# Patient Record
Sex: Male | Born: 1955
Health system: Southern US, Community
[De-identification: ages and names within clinical notes are randomized; demographics above are authoritative.]

## PROBLEM LIST (undated history)

## (undated) DIAGNOSIS — F419 Anxiety disorder, unspecified: Secondary | ICD-10-CM

## (undated) DIAGNOSIS — R197 Diarrhea, unspecified: Secondary | ICD-10-CM

## (undated) DIAGNOSIS — M1990 Unspecified osteoarthritis, unspecified site: Secondary | ICD-10-CM

## (undated) DIAGNOSIS — G4733 Obstructive sleep apnea (adult) (pediatric): Secondary | ICD-10-CM

## (undated) DIAGNOSIS — G9349 Other encephalopathy: Secondary | ICD-10-CM

## (undated) DIAGNOSIS — K56609 Unspecified intestinal obstruction, unspecified as to partial versus complete obstruction: Secondary | ICD-10-CM

## (undated) DIAGNOSIS — R011 Cardiac murmur, unspecified: Secondary | ICD-10-CM

## (undated) DIAGNOSIS — F32A Depression, unspecified: Secondary | ICD-10-CM

## (undated) DIAGNOSIS — I10 Essential (primary) hypertension: Secondary | ICD-10-CM

## (undated) DIAGNOSIS — U071 COVID-19: Secondary | ICD-10-CM

## (undated) DIAGNOSIS — G4730 Sleep apnea, unspecified: Secondary | ICD-10-CM

## (undated) DIAGNOSIS — I639 Cerebral infarction, unspecified: Secondary | ICD-10-CM

## (undated) DIAGNOSIS — K4031 Unilateral inguinal hernia, with obstruction, without gangrene, recurrent: Secondary | ICD-10-CM

## (undated) HISTORY — DX: Sleep apnea, unspecified: G47.30

## (undated) HISTORY — PX: JOINT REPLACEMENT: SHX530

## (undated) HISTORY — DX: Depression, unspecified: F32.A

## (undated) HISTORY — DX: Unilateral inguinal hernia, with obstruction, without gangrene, recurrent: K40.31

## (undated) HISTORY — PX: HERNIA REPAIR: SHX51

## (undated) HISTORY — DX: Unspecified intestinal obstruction, unspecified as to partial versus complete obstruction: K56.609

## (undated) HISTORY — DX: COVID-19: U07.1

## (undated) HISTORY — DX: Cerebral infarction, unspecified: I63.9

## (undated) HISTORY — DX: Obstructive sleep apnea (adult) (pediatric): G47.33

## (undated) HISTORY — PX: KNEE SURGERY: SHX244

## (undated) HISTORY — DX: Unspecified osteoarthritis, unspecified site: M19.90

## (undated) HISTORY — DX: Other encephalopathy: G93.49

---

## 2006-08-30 ENCOUNTER — Ambulatory Visit (HOSPITAL_COMMUNITY): Admission: RE | Admit: 2006-08-30 | Discharge: 2006-08-30 | Payer: Self-pay | Admitting: Family Medicine

## 2015-03-25 ENCOUNTER — Encounter (INDEPENDENT_AMBULATORY_CARE_PROVIDER_SITE_OTHER): Payer: No Typology Code available for payment source | Admitting: Ophthalmology

## 2015-03-25 DIAGNOSIS — H33301 Unspecified retinal break, right eye: Secondary | ICD-10-CM | POA: Diagnosis not present

## 2015-03-25 DIAGNOSIS — I10 Essential (primary) hypertension: Secondary | ICD-10-CM

## 2015-03-25 DIAGNOSIS — H35033 Hypertensive retinopathy, bilateral: Secondary | ICD-10-CM

## 2015-03-25 DIAGNOSIS — H3531 Nonexudative age-related macular degeneration: Secondary | ICD-10-CM

## 2015-03-25 DIAGNOSIS — H43813 Vitreous degeneration, bilateral: Secondary | ICD-10-CM

## 2015-04-02 ENCOUNTER — Ambulatory Visit (INDEPENDENT_AMBULATORY_CARE_PROVIDER_SITE_OTHER): Payer: No Typology Code available for payment source | Admitting: Ophthalmology

## 2015-04-02 DIAGNOSIS — H33301 Unspecified retinal break, right eye: Secondary | ICD-10-CM

## 2015-04-10 ENCOUNTER — Ambulatory Visit (INDEPENDENT_AMBULATORY_CARE_PROVIDER_SITE_OTHER): Payer: No Typology Code available for payment source | Admitting: Ophthalmology

## 2015-04-10 DIAGNOSIS — H33301 Unspecified retinal break, right eye: Secondary | ICD-10-CM

## 2015-05-19 ENCOUNTER — Observation Stay (HOSPITAL_COMMUNITY)
Admission: EM | Admit: 2015-05-19 | Discharge: 2015-05-20 | Disposition: A | Discharge status: Home / Self Care | Payer: No Typology Code available for payment source | Attending: Internal Medicine | Admitting: Internal Medicine

## 2015-05-19 ENCOUNTER — Emergency Department (HOSPITAL_COMMUNITY): Payer: No Typology Code available for payment source

## 2015-05-19 ENCOUNTER — Encounter (HOSPITAL_COMMUNITY): Payer: Self-pay | Admitting: Emergency Medicine

## 2015-05-19 DIAGNOSIS — N179 Acute kidney failure, unspecified: Secondary | ICD-10-CM | POA: Diagnosis present

## 2015-05-19 DIAGNOSIS — I10 Essential (primary) hypertension: Secondary | ICD-10-CM | POA: Diagnosis not present

## 2015-05-19 DIAGNOSIS — R739 Hyperglycemia, unspecified: Secondary | ICD-10-CM

## 2015-05-19 DIAGNOSIS — F419 Anxiety disorder, unspecified: Secondary | ICD-10-CM | POA: Diagnosis present

## 2015-05-19 DIAGNOSIS — Z79899 Other long term (current) drug therapy: Secondary | ICD-10-CM | POA: Insufficient documentation

## 2015-05-19 DIAGNOSIS — Z7982 Long term (current) use of aspirin: Secondary | ICD-10-CM | POA: Diagnosis not present

## 2015-05-19 DIAGNOSIS — R079 Chest pain, unspecified: Secondary | ICD-10-CM

## 2015-05-19 DIAGNOSIS — R110 Nausea: Secondary | ICD-10-CM | POA: Insufficient documentation

## 2015-05-19 HISTORY — DX: Essential (primary) hypertension: I10

## 2015-05-19 HISTORY — DX: Hyperglycemia, unspecified: R73.9

## 2015-05-19 HISTORY — DX: Chest pain, unspecified: R07.9

## 2015-05-19 HISTORY — DX: Anxiety disorder, unspecified: F41.9

## 2015-05-19 LAB — COMPREHENSIVE METABOLIC PANEL
ALK PHOS: 52 U/L (ref 38–126)
ALT: 22 U/L (ref 17–63)
ANION GAP: 11 (ref 5–15)
AST: 22 U/L (ref 15–41)
Albumin: 4.1 g/dL (ref 3.5–5.0)
BILIRUBIN TOTAL: 1.9 mg/dL — AB (ref 0.3–1.2)
BUN: 33 mg/dL — ABNORMAL HIGH (ref 6–20)
CALCIUM: 9.2 mg/dL (ref 8.9–10.3)
CO2: 26 mmol/L (ref 22–32)
CREATININE: 1.26 mg/dL — AB (ref 0.61–1.24)
Chloride: 99 mmol/L — ABNORMAL LOW (ref 101–111)
GLUCOSE: 117 mg/dL — AB (ref 65–99)
Potassium: 3.5 mmol/L (ref 3.5–5.1)
Sodium: 136 mmol/L (ref 135–145)
Total Protein: 7.3 g/dL (ref 6.5–8.1)

## 2015-05-19 LAB — CBC WITH DIFFERENTIAL/PLATELET
BASOS ABS: 0 10*3/uL (ref 0.0–0.1)
BASOS PCT: 0 % (ref 0–1)
EOS ABS: 0 10*3/uL (ref 0.0–0.7)
EOS PCT: 0 % (ref 0–5)
HEMATOCRIT: 49 % (ref 39.0–52.0)
Hemoglobin: 17.2 g/dL — ABNORMAL HIGH (ref 13.0–17.0)
LYMPHS ABS: 1.5 10*3/uL (ref 0.7–4.0)
LYMPHS PCT: 11 % — AB (ref 12–46)
MCH: 32.3 pg (ref 26.0–34.0)
MCHC: 35.1 g/dL (ref 30.0–36.0)
MCV: 92.1 fL (ref 78.0–100.0)
MONO ABS: 1.5 10*3/uL — AB (ref 0.1–1.0)
Monocytes Relative: 11 % (ref 3–12)
NEUTROS PCT: 78 % — AB (ref 43–77)
Neutro Abs: 10.6 10*3/uL — ABNORMAL HIGH (ref 1.7–7.7)
Platelets: 324 10*3/uL (ref 150–400)
RBC: 5.32 MIL/uL (ref 4.22–5.81)
RDW: 12.5 % (ref 11.5–15.5)
WBC: 13.7 10*3/uL — ABNORMAL HIGH (ref 4.0–10.5)

## 2015-05-19 LAB — LIPID PANEL
CHOLESTEROL: 153 mg/dL (ref 0–200)
HDL: 41 mg/dL (ref >40)
LDL CALC: 99 mg/dL (ref 0–99)
Total CHOL/HDL Ratio: 3.7 RATIO
Triglycerides: 66 mg/dL (ref <150)
VLDL: 13 mg/dL (ref 0–40)

## 2015-05-19 LAB — LIPASE, BLOOD: Lipase: 24 U/L (ref 22–51)

## 2015-05-19 LAB — TROPONIN I
Troponin I: <0.03 ng/mL (ref <0.031)
Troponin I: <0.03 ng/mL (ref <0.031)

## 2015-05-19 LAB — TSH: TSH: 1.497 u[IU]/mL (ref 0.350–4.500)

## 2015-05-19 MED ORDER — MORPHINE SULFATE 2 MG/ML IJ SOLN: 1.0000 mg | INTRAMUSCULAR | Status: DC | PRN

## 2015-05-19 MED ORDER — GI COCKTAIL ~~LOC~~
30.0000 mL | Freq: Once | ORAL | Status: AC
  Administered 2015-05-19: 30 mL via ORAL
  Filled 2015-05-19: qty 30

## 2015-05-19 MED ORDER — ENOXAPARIN SODIUM 40 MG/0.4ML ~~LOC~~ SOLN
40.0000 mg | SUBCUTANEOUS | Status: DC
  Administered 2015-05-19: 40 mg via SUBCUTANEOUS
  Filled 2015-05-19: qty 0.4

## 2015-05-19 MED ORDER — NITROGLYCERIN 0.4 MG SL SUBL: 0.4000 mg | SUBLINGUAL_TABLET | SUBLINGUAL | Status: DC | PRN

## 2015-05-19 MED ORDER — ONDANSETRON HCL 4 MG/2ML IJ SOLN: 4.0000 mg | Freq: Four times a day (QID) | INTRAMUSCULAR | Status: DC | PRN

## 2015-05-19 MED ORDER — ASPIRIN EC 325 MG PO TBEC
325.0000 mg | DELAYED_RELEASE_TABLET | Freq: Every day | ORAL | Status: DC
  Administered 2015-05-19 – 2015-05-20 (×2): 325 mg via ORAL
  Filled 2015-05-19 (×2): qty 1

## 2015-05-19 MED ORDER — HYDROCODONE-ACETAMINOPHEN 5-325 MG PO TABS: 1.0000 | ORAL_TABLET | ORAL | Status: DC | PRN

## 2015-05-19 MED ORDER — SODIUM CHLORIDE 0.9 % IJ SOLN: 3.0000 mL | Freq: Two times a day (BID) | INTRAMUSCULAR | Status: DC

## 2015-05-19 MED ORDER — MAGNESIUM CITRATE PO SOLN: 1.0000 | Freq: Once | ORAL | Status: AC | PRN

## 2015-05-19 MED ORDER — SORBITOL 70 % SOLN: 30.0000 mL | Freq: Every day | Status: DC | PRN

## 2015-05-19 MED ORDER — POLYETHYLENE GLYCOL 3350 17 G PO PACK: 17.0000 g | PACK | Freq: Every day | ORAL | Status: DC | PRN

## 2015-05-19 MED ORDER — GI COCKTAIL ~~LOC~~: 30.0000 mL | Freq: Three times a day (TID) | ORAL | Status: DC | PRN

## 2015-05-19 MED ORDER — ALPRAZOLAM 1 MG PO TABS: 1.0000 mg | ORAL_TABLET | Freq: Every evening | ORAL | Status: DC | PRN

## 2015-05-19 MED ORDER — ACETAMINOPHEN 650 MG RE SUPP: 650.0000 mg | Freq: Four times a day (QID) | RECTAL | Status: DC | PRN

## 2015-05-19 MED ORDER — AMLODIPINE BESYLATE 5 MG PO TABS
10.0000 mg | ORAL_TABLET | Freq: Every day | ORAL | Status: DC
Start: 2015-05-19 — End: 2015-05-20
  Administered 2015-05-19 – 2015-05-20 (×2): 10 mg via ORAL
  Filled 2015-05-19 (×2): qty 2

## 2015-05-19 MED ORDER — SODIUM CHLORIDE 0.9 % IV SOLN
INTRAVENOUS | Status: DC
  Administered 2015-05-19 – 2015-05-20 (×2): via INTRAVENOUS

## 2015-05-19 MED ORDER — ONDANSETRON HCL 4 MG PO TABS: 4.0000 mg | ORAL_TABLET | Freq: Four times a day (QID) | ORAL | Status: DC | PRN

## 2015-05-19 MED ORDER — ACETAMINOPHEN 325 MG PO TABS
650.0000 mg | ORAL_TABLET | Freq: Four times a day (QID) | ORAL | Status: DC | PRN
Start: 2015-05-19 — End: 2015-05-20

## 2015-05-19 NOTE — ED Provider Notes (Signed)
CSN: [id number]     Arrival date & time 05/19/15  1134 History   First MD Initiated Contact with Patient 05/19/15 1211     Chief Complaint  Patient presents with  . Nausea  . Excessive Sweating  . Chest Pain      HPI Pt was seen at 1235. Per pt and his family, c/o gradual onset and persistence of multiple intermittent episodes of chest discomfort for the past several years, worse over the past 2 days. Pt states he "gets like this every 6 months or so." Symptoms include: diaphoresis, nausea, chest discomfort, and "indigestion." Symptoms last approximately 1 hour or so before resolving. Symptoms are not associated with eating. Current symptoms have been intermittent x2 days, occurring during exertion. Pt feels improved while resting now. Pt was evaluated by his PMD this morning, then sent to the ED for further evaluation "of his heart." Pt's family states pt's father "had some kind of heart issue in his 58's." Denies palpitations, no SOB/cough, no abd pain, no vomiting/diarrhea, no back pain.    Past Medical History  Diagnosis Date  . Hypertension    Past Surgical History  Procedure Laterality Date  . Knee surgery      History  Substance Use Topics  . Smoking status: Never Smoker   . Smokeless tobacco: Not on file  . Alcohol Use: No    Review of Systems ROS: Statement: All systems negative except as marked or noted in the HPI; Constitutional: Negative for fever and chills. ; ; Eyes: Negative for eye pain, redness and discharge. ; ; ENMT: Negative for ear pain, hoarseness, nasal congestion, sinus pressure and sore throat. ; ; Cardiovascular: +CP, diaphoresis. Negative for palpitations, dyspnea and peripheral edema. ; ; Respiratory: Negative for cough, wheezing and stridor. ; ; Gastrointestinal: +nausea. Negative for vomiting, diarrhea, abdominal pain, blood in stool, hematemesis, jaundice and rectal bleeding. . ; ; Genitourinary: Negative for dysuria, flank pain and hematuria. ; ;  Musculoskeletal: Negative for back pain and neck pain. Negative for swelling and trauma.; ; Skin: Negative for pruritus, rash, abrasions, blisters, bruising and skin lesion.; ; Neuro: Negative for headache, lightheadedness and neck stiffness. Negative for weakness, altered level of consciousness , altered mental status, extremity weakness, paresthesias, involuntary movement, seizure and syncope.       Allergies  Review of patient's allergies indicates no known allergies.  Home Medications   Prior to Admission medications   Medication Sig Start Date End Date Taking? Authorizing Provider  ALPRAZolam Duanne Moron) 1 MG tablet Take 1 mg by mouth at bedtime as needed for anxiety or sleep.   Yes Historical Provider, MD  amLODipine (NORVASC) 10 MG tablet Take 10 mg by mouth daily.   Yes Historical Provider, MD  aspirin EC 81 MG tablet Take 81 mg by mouth daily.   Yes Historical Provider, MD  cholecalciferol (VITAMIN D) 1000 UNITS tablet Take 1,000 Units by mouth daily.   Yes Historical Provider, MD  clindamycin (CLEOCIN) 300 MG capsule Take 300 mg by mouth 2 (two) times daily. Starting 05/19/2015 x 6 days.   Yes Historical Provider, MD  naproxen sodium (ALEVE) 220 MG tablet Take 220 mg by mouth 2 (two) times daily as needed (pain).   Yes Historical Provider, MD  vitamin C (ASCORBIC ACID) 500 MG tablet Take 500 mg by mouth daily.   Yes Historical Provider, MD   BP 160/97 mmHg  Pulse 75  Temp(Src) 97.9 F (36.6 C) (Oral)  Resp 18  Ht 6' 1"  (1.854  m)  Wt 201 lb (91.173 kg)  BMI 26.52 kg/m2  SpO2 98% Physical Exam  1240: Physical examination:  Nursing notes reviewed; Vital signs and O2 SAT reviewed;  Constitutional: Well developed, Well nourished, Well hydrated, In no acute distress; Head:  Normocephalic, atraumatic; Eyes: EOMI, PERRL, No scleral icterus; ENMT: Mouth and pharynx normal, Mucous membranes moist; Neck: Supple, Full range of motion, No lymphadenopathy; Cardiovascular: Regular rate and  rhythm, No gallop; Respiratory: Breath sounds clear & equal bilaterally, No wheezes.  Speaking full sentences with ease, Normal respiratory effort/excursion; Chest: Nontender, Movement normal; Abdomen: Soft, Nontender, Nondistended, Normal bowel sounds; Genitourinary: No CVA tenderness; Extremities: Pulses normal, No tenderness, No edema, No calf edema or asymmetry.; Neuro: AA&Ox3, Major CN grossly intact.  Speech clear. No gross focal motor or sensory deficits in extremities.; Skin: Color normal, Warm, Dry.   ED Course  Procedures      EKG Interpretation   Date/Time:  Tuesday May 19 2015 11:54:17 EDT Ventricular Rate:  74 PR Interval:  112 QRS Duration: 93 QT Interval:  378 QTC Calculation: 419 R Axis:   91 Text Interpretation:  Sinus rhythm Borderline short PR interval Right axis  deviation Nonspecific repol abnormality, lateral leads Baseline wander No  old tracing to compare Confirmed by [facility]  MD, Nunzio Cory [phone]) on  05/19/2015 1:13:10 PM      MDM  MDM Reviewed: previous chart, nursing note and vitals Reviewed previous: labs Interpretation: labs, ECG and x-ray      Results for orders placed or performed during the hospital encounter of 05/19/15  Troponin I  Result Value Ref Range   Troponin I <0.03 <0.031 ng/mL  CBC with Differential  Result Value Ref Range   WBC 13.7 (H) 4.0 - 10.5 K/uL   RBC 5.32 4.22 - 5.81 MIL/uL   Hemoglobin 17.2 (H) 13.0 - 17.0 g/dL   HCT 49.0 39.0 - 52.0 %   MCV 92.1 78.0 - 100.0 fL   MCH 32.3 26.0 - 34.0 pg   MCHC 35.1 30.0 - 36.0 g/dL   RDW 12.5 11.5 - 15.5 %   Platelets 324 150 - 400 K/uL   Neutrophils Relative % 78 (H) 43 - 77 %   Neutro Abs 10.6 (H) 1.7 - 7.7 K/uL   Lymphocytes Relative 11 (L) 12 - 46 %   Lymphs Abs 1.5 0.7 - 4.0 K/uL   Monocytes Relative 11 3 - 12 %   Monocytes Absolute 1.5 (H) 0.1 - 1.0 K/uL   Eosinophils Relative 0 0 - 5 %   Eosinophils Absolute 0.0 0.0 - 0.7 K/uL   Basophils Relative 0 0 - 1 %    Basophils Absolute 0.0 0.0 - 0.1 K/uL  Comprehensive metabolic panel  Result Value Ref Range   Sodium 136 135 - 145 mmol/L   Potassium 3.5 3.5 - 5.1 mmol/L   Chloride 99 (L) 101 - 111 mmol/L   CO2 26 22 - 32 mmol/L   Glucose, Bld 117 (H) 65 - 99 mg/dL   BUN 33 (H) 6 - 20 mg/dL   Creatinine, Ser 1.26 (H) 0.61 - 1.24 mg/dL   Calcium 9.2 8.9 - 10.3 mg/dL   Total Protein 7.3 6.5 - 8.1 g/dL   Albumin 4.1 3.5 - 5.0 g/dL   AST 22 15 - 41 U/L   ALT 22 17 - 63 U/L   Alkaline Phosphatase 52 38 - 126 U/L   Total Bilirubin 1.9 (H) 0.3 - 1.2 mg/dL   GFR calc non Af Amer >  60 >60 mL/min   GFR calc Af Amer >60 >60 mL/min   Anion gap 11 5 - 15  Lipase, blood  Result Value Ref Range   Lipase 24 22 - 51 U/L   Dg Chest 2 View 05/19/2015   CLINICAL DATA:  Lightheadedness, dizziness and nausea since yesterday.  EXAM: CHEST  2 VIEW  COMPARISON:  None.  FINDINGS: The cardiac silhouette, mediastinal and hilar contours are normal. The lungs are clear. No pleural effusion. The bony thorax is intact.  IMPRESSION: Normal chest x-ray.   Electronically Signed   By: Marijo Sanes M.D.   On: 05/19/2015 12:56    1410:  Concern for anginal equivalent. Pt currently without symptoms. Heart score 5; will observation admit. Dx and testing d/w pt and family.  Questions answered.  Verb understanding, agreeable to admit.  T/C to [facility] Marin Comment, case discussed, including:  HPI, pertinent PM/SHx, VS/PE, dx testing, ED course and treatment:  Agreeable to admit, requests to write temporary orders, obtain observation tele bed to team APAdmits.   Francine Graven, DO 05/21/15 1248

## 2015-05-19 NOTE — H&P (Signed)
[facility] History and Physical  Justin [name] ERX:[id number] DOB: 1956/10/25 DOA: 05/19/2015  Referring physician: Thurnell Garbe PCP: Glo Herring., MD   Chief Complaint: chest pain/diaphoresis/nause  HPI: Justin [name] is a very pleasant 59 y.o. male with past medical hx HTN, anxiety presents to the emergency Department chief complaint of chest pain/diaphoresis nausea. He is admitted for rule out.  Patient states 2 days ago or getting ready for church he developed sudden onset of wooziness. He went on to church and during the service he developed diaphoresis and nausea. During this time he denies any chest pain or discomfort. He does report later in the day developing "indigestion". He denies any history of GERD. States the discomfort located in the substernal area does not radiate equates it with a sensation "of needing to belch". He belched and reports feeling better. Reports not feeling well for 2 days afterwards specifically low-energy low oral intake intermittent chest pain. He denies fever chills abdominal pain dysuria hematuria frequency or urgency. He denies headache visual disturbances syncope or near-syncope. He denies any lower extremity edema but did report sleeping the last 2 nights propped up on 2 pillows. He went to his primary care provider today at his wife's insistence and was referred to the emergency department  Workup in the emergency department significant for WBCs of 13.7 creatinine 1.26 serum glucose 117. Chest x-ray with no acute abnormality. Hemodynamically stable and not hypoxic.  Review of Systems:  10 point review of systems complete and all systems are negative except as indicated in the history of present illness  Past Medical History  Diagnosis Date  . Hypertension   . Anxiety    Past Surgical History  Procedure Laterality Date  . Knee surgery     Social History:  reports that he has never smoked. He does not have any smokeless tobacco history on file.  He reports that he does not drink alcohol or use illicit drugs. 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 other deceased in her 75s history of TB cervical cancer stroke. Father died in his 62s of unknown cause. One brother 17 history of stroke  Prior to Admission medications   Medication Sig Start Date End Date Taking? Authorizing Provider  ALPRAZolam Duanne Moron) 1 MG tablet Take 1 mg by mouth at bedtime as needed for anxiety or sleep.   Yes Historical Provider, MD  amLODipine (NORVASC) 10 MG tablet Take 10 mg by mouth daily.   Yes Historical Provider, MD  aspirin EC 81 MG tablet Take 81 mg by mouth daily.   Yes Historical Provider, MD  cholecalciferol (VITAMIN D) 1000 UNITS tablet Take 1,000 Units by mouth daily.   Yes Historical Provider, MD  clindamycin (CLEOCIN) 300 MG capsule Take 300 mg by mouth 2 (two) times daily. Starting 05/19/2015 x 6 days.   Yes Historical Provider, MD  naproxen sodium (ALEVE) 220 MG tablet Take 220 mg by mouth 2 (two) times daily as needed (pain).   Yes Historical Provider, MD  vitamin C (ASCORBIC ACID) 500 MG tablet Take 500 mg by mouth daily.   Yes Historical Provider, MD   Physical Exam: Filed Vitals:   05/19/15 1330 05/19/15 1400 05/19/15 1430 05/19/15 1446  BP: 127/90 143/85 145/83   Pulse: 72 66 69   Temp:    98.8 F (37.1 C)  TempSrc:    Oral  Resp: 18 17 21    Height:  Weight:      SpO2: 99% 97% 97%     Wt Readings from Last 3 Encounters:  05/19/15 91.173 kg (201 lb)    General:  Appears slightly anxious and comfortable Eyes: PERRL, normal lids, irises & conjunctiva ENT: grossly normal hearing, because membranes of his mouth are pink but very dry Neck: no LAD, masses or thyromegaly Cardiovascular: RRR, no m/r/g. No LE edema. Pedal pulses present and palpable. Nontender to palpation Respiratory: CTA bilaterally, no w/r/r. Normal respiratory  effort. Abdomen: soft, ntnd also bowel sounds throughout no guarding Skin: no rash or induration seen on limited exam Musculoskeletal: grossly normal tone BUE/BLE Psychiatric: grossly normal mood and affect, speech fluent and appropriate Neurologic: grossly non-focal. Speech clear facial symmetry           Labs on Admission:  Basic Metabolic Panel:  Recent Labs Lab 05/19/15 1234  NA 136  K 3.5  CL 99*  CO2 26  GLUCOSE 117*  BUN 33*  CREATININE 1.26*  CALCIUM 9.2   Liver Function Tests:  Recent Labs Lab 05/19/15 1234  AST 22  ALT 22  ALKPHOS 52  BILITOT 1.9*  PROT 7.3  ALBUMIN 4.1    Recent Labs Lab 05/19/15 1234  LIPASE 24   No results for input(s): AMMONIA in the last 168 hours. CBC:  Recent Labs Lab 05/19/15 1234  WBC 13.7*  NEUTROABS 10.6*  HGB 17.2*  HCT 49.0  MCV 92.1  PLT 324   Cardiac Enzymes:  Recent Labs Lab 05/19/15 1234  TROPONINI <0.03    BNP (last 3 results) No results for input(s): BNP in the last 8760 hours.  ProBNP (last 3 results) No results for input(s): PROBNP in the last 8760 hours.  CBG: No results for input(s): GLUCAP in the last 168 hours.  Radiological Exams on Admission: Dg Chest 2 View  05/19/2015   CLINICAL DATA:  Lightheadedness, dizziness and nausea since yesterday.  EXAM: CHEST  2 VIEW  COMPARISON:  None.  FINDINGS: The cardiac silhouette, mediastinal and hilar contours are normal. The lungs are clear. No pleural effusion. The bony thorax is intact.  IMPRESSION: Normal chest x-ray.   Electronically Signed   By: Marijo Sanes M.D.   On: 05/19/2015 12:56    EKG: Independently reviewed. Sinus rhythm borderline short PR interval right axis deviation Nonspecific repol abnormality, lateral leadsBaseline wander  Assessment/Plan Principal Problem:   Chest pain: Some typical and atypical features suspect this is mostly GI related. Will admit to telemetry. Heart score 3. Monitor on telemetry we'll get serial EKGs and  cycle troponin. Will also obtain lipid panel hemoglobin A1c TSH. Request 2-D echo for completeness. Will provide 325 mg of aspirin and consider a statin.  Will likely benefit from stress test but at this point I think he can probably be arranged outpatient. Active Problems: Hypertension. Fair control. He is on Norvasc at home. I will continue this. He reports compliance with medication and good control of his blood pressure.   Acute renal failure: Likely related to decreased oral intake. Will hold any nephrotoxic. Will monitor intake and output. Will provide gentle IV fluids. Will recheck in the morning. If no improvement will consider renal ultrasound.    Hyperglycemia: Mild. Will obtain a hemoglobin A1c.    Anxiety: Appears to be stable at baseline. Continue home medications        Code Status: full DVT Prophylaxis: Family Communication: wife at bedside Disposition Plan: home hopefully tomorrow  Time spent: 60 minutes  [facility]  [facility] Pager [phone]  \

## 2015-05-19 NOTE — ED Notes (Signed)
Having nausea and gastric reflux. Denies chest pain but was sent by [facility] to check heart.

## 2015-05-19 NOTE — ED Notes (Signed)
Report given to receiving RN. Pt ready for transport.

## 2015-05-19 NOTE — ED Notes (Signed)
Hospitalist in room with pt.

## 2015-05-20 ENCOUNTER — Observation Stay (HOSPITAL_BASED_OUTPATIENT_CLINIC_OR_DEPARTMENT_OTHER): Payer: No Typology Code available for payment source

## 2015-05-20 ENCOUNTER — Encounter (HOSPITAL_COMMUNITY): Payer: Self-pay | Admitting: General Practice

## 2015-05-20 DIAGNOSIS — R739 Hyperglycemia, unspecified: Secondary | ICD-10-CM | POA: Diagnosis not present

## 2015-05-20 DIAGNOSIS — F419 Anxiety disorder, unspecified: Secondary | ICD-10-CM

## 2015-05-20 DIAGNOSIS — R079 Chest pain, unspecified: Secondary | ICD-10-CM | POA: Diagnosis not present

## 2015-05-20 LAB — CBC
HCT: 44.2 % (ref 39.0–52.0)
Hemoglobin: 15.1 g/dL (ref 13.0–17.0)
MCH: 31.7 pg (ref 26.0–34.0)
MCHC: 34.2 g/dL (ref 30.0–36.0)
MCV: 92.7 fL (ref 78.0–100.0)
Platelets: 280 10*3/uL (ref 150–400)
RBC: 4.77 MIL/uL (ref 4.22–5.81)
RDW: 12.3 % (ref 11.5–15.5)
WBC: 9.8 10*3/uL (ref 4.0–10.5)

## 2015-05-20 LAB — HEMOGLOBIN A1C
Hgb A1c MFr Bld: 5.8 % — ABNORMAL HIGH (ref 4.8–5.6)
Mean Plasma Glucose: 120 mg/dL

## 2015-05-20 LAB — COMPREHENSIVE METABOLIC PANEL
ALBUMIN: 3.5 g/dL (ref 3.5–5.0)
ALT: 22 U/L (ref 17–63)
ANION GAP: 6 (ref 5–15)
AST: 19 U/L (ref 15–41)
Alkaline Phosphatase: 45 U/L (ref 38–126)
BUN: 23 mg/dL — AB (ref 6–20)
CHLORIDE: 102 mmol/L (ref 101–111)
CO2: 28 mmol/L (ref 22–32)
Calcium: 8.3 mg/dL — ABNORMAL LOW (ref 8.9–10.3)
Creatinine, Ser: 1.03 mg/dL (ref 0.61–1.24)
GFR calc Af Amer: >60 mL/min (ref >60)
GFR calc non Af Amer: >60 mL/min (ref >60)
Glucose, Bld: 103 mg/dL — ABNORMAL HIGH (ref 65–99)
Potassium: 4.2 mmol/L (ref 3.5–5.1)
Sodium: 136 mmol/L (ref 135–145)
TOTAL PROTEIN: 6.4 g/dL — AB (ref 6.5–8.1)
Total Bilirubin: 1.5 mg/dL — ABNORMAL HIGH (ref 0.3–1.2)

## 2015-05-20 LAB — TROPONIN I

## 2015-05-20 MED ORDER — LOPERAMIDE HCL 1 MG/5ML PO LIQD
2.0000 mg | Freq: Once | ORAL | Status: AC
  Administered 2015-05-20: 2 mg via ORAL
  Filled 2015-05-20: qty 10

## 2015-05-20 MED ORDER — LOPERAMIDE HCL 1 MG/5ML PO LIQD
ORAL | Status: AC
  Filled 2015-05-20: qty 10

## 2015-05-20 MED ORDER — PANTOPRAZOLE SODIUM 40 MG PO TBEC: 40.0000 mg | DELAYED_RELEASE_TABLET | Freq: Every day | ORAL | Status: DC

## 2015-05-20 MED ORDER — PANTOPRAZOLE SODIUM 40 MG PO TBEC
40.0000 mg | DELAYED_RELEASE_TABLET | Freq: Every day | ORAL | Status: DC
  Administered 2015-05-20: 40 mg via ORAL
  Filled 2015-05-20: qty 1

## 2015-05-20 NOTE — Progress Notes (Addendum)
Pt states that he has been having diarrhea since started an antibiotic for his tooth 2 days ago. I have spoken to Dr.Kim who wants to test for C.Diff and he ordered 2 mg of immodium one time. I will implement Enteric precautions and continue to monitor.

## 2015-05-20 NOTE — Discharge Summary (Signed)
Physician Discharge Summary  [name] SFS:[id number] DOB: Jun 30, 1956 DOA: 05/19/2015  PCP: Glo Herring., MD  Admit date: 05/19/2015 Discharge date: 05/20/2015  Time spent: 40 minutes  Recommendations for Outpatient Follow-up:  1. Follow up with cardiology as scheduled for evaluation of need for stress test   Discharge Diagnoses:  Principal Problem:   Chest pain Active Problems:   Anxiety   Acute renal failure   Hyperglycemia   Discharge Condition: stable  Diet recommendation: heart healthy  Filed Weights   05/19/15 1143  Weight: 91.173 kg (201 lb)    History of present illness:  Justin [name] is a very pleasant 59 y.o. male with past medical hx HTN, anxiety presented to the emergency Department on 05/19/15 chief complaint of chest pain/diaphoresis nausea. He was admitted for rule out. Patient stated 2 days prior as getting ready for church he developed sudden onset of wooziness. He went on to church and during the service he developed diaphoresis and nausea. During this time he denied any chest pain or discomfort. He reported later in the day developing "indigestion". He denied any history of GERD. Stated the discomfort located in the substernal area did not radiate equated it with a sensation "of needing to belch". He belched and reported feeling better. Reported not feeling well for 2 days afterwards specifically low-energy low oral intake intermittent chest pain. He denied fever chills abdominal pain dysuria hematuria frequency or urgency. He denied headache visual disturbances syncope or near-syncope. He denied any lower extremity edema but did report sleeping the last 2 nights propped up on 2 pillows. He went to his primary care provider today at his wife's insistence and was referred to the emergency department Workup in the emergency department significant for WBCs of 13.7 creatinine 1.26 serum glucose 117. Chest x-ray with no acute abnormality. Hemodynamically stable and  not hypoxic.  Hospital Course:  Chest pain: Atypical features. suspect this is mostly GI related as it is related to certain foods which he avoids and relieved by belching.  Heart score 3. No events on telemetry.  EKGs without acute changes, troponin neg x3. Lipid panel within normal limits,  hemoglobin A1c in process at discharge. TSH 1.49. 2-D reveals EF 60% mild LVH. Given LVH will likely benefit from cardiology follow up. Has appointment with [facility] 7/29.  Active Problems: Hypertension. Fair control.   Acute renal failure: Likely related to decreased oral intake.resolved at discharge. Hyperglycemia: Mild. OP follow up with PCP Anxiety: remained stable at baseline.   Procedures:  Echo 05/2015 Wall thickness was increased in a pattern of mild LVH. Systolic function was normal. The estimated ejection fraction was in the range of 60% to 65%. Wall motion was normal; there were no regional wall motion abnormalities. Left ventricular diastolic function parameterswere normal.  Consultations:  none  Discharge Exam: Filed Vitals:   05/20/15 0552  BP: 127/82  Pulse: 70  Temp: 99.1 F (37.3 C)  Resp: 20    General: well nourished appears comfortable Cardiovascular: RRR no MGR no LE edema Respiratory: normal effort BS clear bilaterally no wheeze  Discharge Instructions   Discharge Instructions    Diet - low sodium heart healthy    Complete by:  As directed      Discharge instructions    Complete by:  As directed   Follow up with PCP 1-2 week  Follow up with cardiology as schedule     Increase activity slowly    Complete by:  As directed  Current Discharge Medication List    START taking these medications   Details  pantoprazole (PROTONIX) 40 MG tablet Take 1 tablet (40 mg total) by mouth daily. Qty: 30 tablet, Refills: 0      CONTINUE these medications which have NOT CHANGED   Details  ALPRAZolam (XANAX) 1 MG tablet Take 1 mg by mouth at bedtime as needed  for anxiety or sleep.    amLODipine (NORVASC) 10 MG tablet Take 10 mg by mouth daily.    aspirin EC 81 MG tablet Take 81 mg by mouth daily.    cholecalciferol (VITAMIN D) 1000 UNITS tablet Take 1,000 Units by mouth daily.    clindamycin (CLEOCIN) 300 MG capsule Take 300 mg by mouth 2 (two) times daily. Starting 05/19/2015 x 6 days.    naproxen sodium (ALEVE) 220 MG tablet Take 220 mg by mouth 2 (two) times daily as needed (pain).    vitamin C (ASCORBIC ACID) 500 MG tablet Take 500 mg by mouth daily.       No Known Allergies Follow-up Information    Follow up with Herminio Commons, MD On 06/05/2015.   Specialty:  Cardiology   Why:  appointment at 8:40 am   Contact information:   [address] [place] [address] [phone]       Follow up with Glo Herring., MD On 05/26/2015.   Specialty:  Internal Medicine   Why:  evaluation possible GERD/reflux at 12:30 pm   Contact information:   [address] [place]  [address] [phone]        The results of significant diagnostics from this hospitalization (including imaging, microbiology, ancillary and laboratory) are listed below for reference.    Significant Diagnostic Studies: Dg Chest 2 View  05/19/2015   CLINICAL DATA:  Lightheadedness, dizziness and nausea since yesterday.  EXAM: CHEST  2 VIEW  COMPARISON:  None.  FINDINGS: The cardiac silhouette, mediastinal and hilar contours are normal. The lungs are clear. No pleural effusion. The bony thorax is intact.  IMPRESSION: Normal chest x-ray.   Electronically Signed   By: Marijo Sanes M.D.   On: 05/19/2015 12:56    Labs: Basic Metabolic Panel:  Recent Labs Lab 05/19/15 1234 05/20/15 0714  NA 136 136  K 3.5 4.2  CL 99* 102  CO2 26 28  GLUCOSE 117* 103*  BUN 33* 23*  CREATININE 1.26* 1.03  CALCIUM 9.2 8.3*   Liver Function Tests:  Recent Labs Lab 05/19/15 1234 05/20/15 0714  AST 22 19  ALT 22 22  ALKPHOS 52 45  BILITOT 1.9* 1.5*  PROT  7.3 6.4*  ALBUMIN 4.1 3.5    Recent Labs Lab 05/19/15 1234  LIPASE 24   CBC:  Recent Labs Lab 05/19/15 1234 05/20/15 0714  WBC 13.7* 9.8  NEUTROABS 10.6*  --   HGB 17.2* 15.1  HCT 49.0 44.2  MCV 92.1 92.7  PLT 324 280   Cardiac Enzymes:  Recent Labs Lab 05/19/15 1234 05/19/15 1937 05/20/15 0714  TROPONINI <0.03 <0.03 <0.03   Signed:  Dyanne Carrel M  [facility] 05/20/2015, 3:30 PM  Patient was seen, examined,treatment plan was discussed with the Advance Practice Provider.  I have directly reviewed the clinical findings, lab, imaging studies and management of this patient in detail. I have made the necessary changes to the above noted documentation, and agree with the documentation, as recorded by the Advance Practice Provider.   Marzetta Board, MD [facility] [phone]

## 2015-05-20 NOTE — Progress Notes (Signed)
Patient with orders to be discharge home. Discharge instructions given, patient verbalize understanding. Prescriptions given. Patient stable. Patient left in private vehicle with family.

## 2015-06-05 ENCOUNTER — Encounter: Payer: Self-pay | Admitting: Cardiovascular Disease

## 2015-06-05 ENCOUNTER — Ambulatory Visit (INDEPENDENT_AMBULATORY_CARE_PROVIDER_SITE_OTHER): Payer: PRIVATE HEALTH INSURANCE | Admitting: Cardiovascular Disease

## 2015-06-05 VITALS — BP 126/86 | HR 65 | Ht 73.0 in | Wt 198.0 lb

## 2015-06-05 DIAGNOSIS — R079 Chest pain, unspecified: Secondary | ICD-10-CM

## 2015-06-05 DIAGNOSIS — K219 Gastro-esophageal reflux disease without esophagitis: Secondary | ICD-10-CM

## 2015-06-05 DIAGNOSIS — I10 Essential (primary) hypertension: Secondary | ICD-10-CM | POA: Diagnosis not present

## 2015-06-05 DIAGNOSIS — Z9289 Personal history of other medical treatment: Secondary | ICD-10-CM

## 2015-06-05 DIAGNOSIS — Z8249 Family history of ischemic heart disease and other diseases of the circulatory system: Secondary | ICD-10-CM

## 2015-06-05 DIAGNOSIS — Z87898 Personal history of other specified conditions: Secondary | ICD-10-CM | POA: Diagnosis not present

## 2015-06-05 NOTE — Progress Notes (Signed)
Patient ID: Justin [name], male   DOB: Mar 16, 1956, 59 y.o.   MRN: [id number]       CARDIOLOGY CONSULT NOTE  Patient ID: Justin [name] MRN: [id number] DOB/AGE: 22-Nov-1955 59 y.o.  Admit date: (Not on file) Primary Physician Glo Herring., MD  Reason for Consultation: chest pain  HPI: The patient is a 59 year old male who was recently hospitalized for chest pain. It was deemed atypical for a cardiac etiology and more likely due to GERD. He has a history of hypertension.  Echocardiogram on 05/20/2015 demonstrated normal left ventricular systolic function and regional wall motion, LVEF 60-65%, and mild LVH.  ECG on 05/20/2015 demonstrated sinus rhythm with a mild nonspecific ST segment abnormality and a PVC.  Lipid panel on 05/19/2015 showed total cholesterol 153, triglycerides 66, HDL 41, LDL 99.  He experiences similar episodes approximately twice a year. They are nonexertional. The most recent episode was accompanied by excessive belching. On one occasion prior to this, he vomited. 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 Physical activities are somewhat limited by left knee osteoarthritis and left foot calluses.  Currently denies abdominal pain as well.  Soc: Married. Works as an Clinical biochemist.  Fam: Father died at 67 of unknown causes. Brother had CVA and has pacemaker.  No Known Allergies  Current Outpatient Prescriptions  Medication Sig Dispense Refill  . ALPRAZolam (XANAX) 1 MG tablet Take 1 mg by mouth at bedtime as needed for anxiety or sleep.    Marland Kitchen amLODipine (NORVASC) 10 MG tablet Take 10 mg by mouth daily.    Marland Kitchen aspirin EC 81 MG tablet Take 81 mg by mouth daily.    . cholecalciferol (VITAMIN D) 1000 UNITS tablet Take 2,000 Units by mouth daily.     . naproxen sodium (ALEVE) 220 MG tablet Take 220 mg by mouth 2 (two) times daily as needed (pain).    . pantoprazole (PROTONIX) 40 MG  tablet Take 1 tablet (40 mg total) by mouth daily. 30 tablet 0  . vitamin C (ASCORBIC ACID) 500 MG tablet Take 500 mg by mouth daily.     No current facility-administered medications for this visit.    Past Medical History  Diagnosis Date  . Hypertension   . Anxiety     Past Surgical History  Procedure Laterality Date  . Knee surgery      History   Social History  . Marital Status: Married    Spouse Name: N/A  . Number of Children: N/A  . Years of Education: N/A   Occupational History  . Not on file.   Social History Main Topics  . Smoking status: Never Smoker   . Smokeless tobacco: Not on file  . Alcohol Use: No  . Drug Use: No  . Sexual Activity: Not on file   Other Topics Concern  . Not on file   Social History Narrative     No family history of premature CAD in 1st degree relatives.  Prior to Admission medications   Medication Sig Start Date End Date Taking? Authorizing Provider  ALPRAZolam Duanne Moron) 1 MG tablet Take 1 mg by mouth at bedtime as needed for anxiety or sleep.   Yes Historical Provider, MD  amLODipine (NORVASC) 10 MG tablet Take 10 mg by mouth daily.   Yes Historical Provider, MD  aspirin EC 81 MG tablet Take 81 mg by mouth daily.   Yes Historical Provider, MD  cholecalciferol (VITAMIN  D) 1000 UNITS tablet Take 2,000 Units by mouth daily.    Yes Historical Provider, MD  naproxen sodium (ALEVE) 220 MG tablet Take 220 mg by mouth 2 (two) times daily as needed (pain).   Yes Historical Provider, MD  pantoprazole (PROTONIX) 40 MG tablet Take 1 tablet (40 mg total) by mouth daily. 05/20/15  Yes Lezlie Octave Black, NP  vitamin C (ASCORBIC ACID) 500 MG tablet Take 500 mg by mouth daily.   Yes Historical Provider, MD     Review of systems complete and found to be negative unless listed above in HPI     Physical exam Blood pressure 126/86, pulse 65, height 6' 1"  (1.854 m), weight 198 lb (89.812 kg), SpO2 97 %. General: NAD Neck: No JVD, no thyromegaly or  thyroid nodule.  Lungs: Clear to auscultation bilaterally with normal respiratory effort. CV: Nondisplaced PMI. Regular rate and rhythm, normal S1/S2, no S3/S4, no murmur.  No peripheral edema.  No carotid bruit.   Abdomen: Soft, nontender, no distention.  Skin: Intact without lesions or rashes.  Neurologic: Alert and oriented x 3.  Psych: Normal affect. Extremities: Left knee osteoarthritic changes.  HEENT: Normal.   ECG: Most recent ECG reviewed.  Labs:   Lab Results  Component Value Date   WBC 9.8 05/20/2015   HGB 15.1 05/20/2015   HCT 44.2 05/20/2015   MCV 92.7 05/20/2015   PLT 280 05/20/2015   No results for input(s): NA, K, CL, CO2, BUN, CREATININE, CALCIUM, PROT, BILITOT, ALKPHOS, ALT, AST, GLUCOSE in the last 168 hours.  Invalid input(s): LABALBU Lab Results  Component Value Date   TROPONINI <0.03 05/20/2015    Lab Results  Component Value Date   CHOL 153 05/19/2015   Lab Results  Component Value Date   HDL 41 05/19/2015   Lab Results  Component Value Date   LDLCALC 99 05/19/2015   Lab Results  Component Value Date   TRIG 66 05/19/2015   Lab Results  Component Value Date   CHOLHDL 3.7 05/19/2015   No results found for: LDLDIRECT       Studies: No results found.  ASSESSMENT AND PLAN:  1. Chest pain: Atypical symptoms for cardiac etiology but has been recurrent. Uncertain family history given father's premature death. Cannot walk on treadmill due to severe left knee osteoarthritis. Will obtain Lexiscan Cardiolite stress test.  2. Essential HTN: Controlled. No changes.  3. GERD: Takes Protonix prn.  Dispo: f/u to be determined after stress testing.   Signed: Kate Sable, M.D., F.A.C.C.  06/05/2015, 8:43 AM

## 2015-06-05 NOTE — Patient Instructions (Signed)
Your physician recommends that you schedule a follow-up appointment in: TO BE DETERMINED BY TEST RESULTS WITH DR. Bronson Ing  Your physician recommends that you continue on your current medications as directed. Please refer to the Current Medication list given to you today.   Your physician has requested that you have a lexiscan myoview. For further information please visit [email or web address]. Please follow instruction sheet, as given.  Thanks for choosing [facility]!!!

## 2015-08-10 ENCOUNTER — Ambulatory Visit (INDEPENDENT_AMBULATORY_CARE_PROVIDER_SITE_OTHER): Payer: No Typology Code available for payment source | Admitting: Ophthalmology

## 2016-06-02 ENCOUNTER — Ambulatory Visit (INDEPENDENT_AMBULATORY_CARE_PROVIDER_SITE_OTHER): Payer: No Typology Code available for payment source | Admitting: Otolaryngology

## 2017-03-27 ENCOUNTER — Encounter: Payer: Self-pay | Admitting: Family Medicine

## 2017-03-27 ENCOUNTER — Ambulatory Visit (INDEPENDENT_AMBULATORY_CARE_PROVIDER_SITE_OTHER): Payer: BLUE CROSS/BLUE SHIELD | Admitting: Family Medicine

## 2017-03-27 VITALS — BP 182/110 | HR 84 | Temp 97.9°F | Resp 18 | Ht 73.0 in | Wt 184.0 lb

## 2017-03-27 DIAGNOSIS — Z125 Encounter for screening for malignant neoplasm of prostate: Secondary | ICD-10-CM

## 2017-03-27 DIAGNOSIS — Z0000 Encounter for general adult medical examination without abnormal findings: Secondary | ICD-10-CM

## 2017-03-27 DIAGNOSIS — Z1159 Encounter for screening for other viral diseases: Secondary | ICD-10-CM | POA: Diagnosis not present

## 2017-03-27 DIAGNOSIS — I10 Essential (primary) hypertension: Secondary | ICD-10-CM

## 2017-03-27 MED ORDER — LOSARTAN POTASSIUM 100 MG PO TABS: 100.0000 mg | ORAL_TABLET | Freq: Every day | ORAL | 3 refills | Status: DC

## 2017-03-27 NOTE — Progress Notes (Signed)
Subjective:    Patient ID: Justin [name], male    DOB: 03/23/1956, 61 y.o.   MRN: [id number]  HPI Patient is a very pleasant 61 year old white male here today to establish care. Past medical history is significant for hypertension. He has been off all blood pressure medication for more than a year. He discontinued amlodipine due to issues with being unable to sweat on the medication. He discontinued an ACE inhibitor in the past due to coughing. He has never had a colonoscopy. He is overdue for PSA. He is due for hepatitis C and HIV testing. Tetanus shot is up-to-date. He denies any medical concerns today Past Medical History:  Diagnosis Date  . Anxiety   . Hypertension    Past Surgical History:  Procedure Laterality Date  . KNEE SURGERY     tore mcl in high school (70's)   Current Outpatient Prescriptions on File Prior to Visit  Medication Sig Dispense Refill  . aspirin EC 81 MG tablet Take 81 mg by mouth daily.    . cholecalciferol (VITAMIN D) 1000 UNITS tablet Take 2,000 Units by mouth daily.     . vitamin C (ASCORBIC ACID) 500 MG tablet Take 500 mg by mouth daily.    Marland Kitchen amLODipine (NORVASC) 10 MG tablet Take 10 mg by mouth daily.     No current facility-administered medications on file prior to visit.    No Known Allergies Social History   Social History  . Marital status: Married    Spouse name: N/A  . Number of children: N/A  . Years of education: N/A   Occupational History  . Not on file.   Social History Main Topics  . Smoking status: Never Smoker  . Smokeless tobacco: Never Used  . Alcohol use No  . Drug use: No  . Sexual activity: Not on file   Other Topics Concern  . Not on file   Social History Narrative  . No narrative on file   Family History  Problem Relation Age of Onset  . Tuberculosis Mother   . Stroke Mother   . Heart disease Father        died at 51  . Bradycardia Brother        pacemaker  . Cancer Paternal Uncle   . Mental illness  Paternal Grandfather        suicide at 21      Review of Systems  All other systems reviewed and are negative.      Objective:   Physical Exam  Constitutional: He is oriented to person, place, and time. He appears well-developed and well-nourished. No distress.  HENT:  Head: Normocephalic and atraumatic.  Right Ear: External ear normal.  Left Ear: External ear normal.  Nose: Nose normal.  Mouth/Throat: Oropharynx is clear and moist. No oropharyngeal exudate.  Eyes: Conjunctivae and EOM are normal. Pupils are equal, round, and reactive to light. Right eye exhibits no discharge. Left eye exhibits no discharge. No scleral icterus.  Neck: Normal range of motion. Neck supple. No JVD present. No tracheal deviation present. No thyromegaly present.  Cardiovascular: Normal rate, regular rhythm, normal heart sounds and intact distal pulses.  Exam reveals no gallop and no friction rub.   No murmur heard. Pulmonary/Chest: Effort normal and breath sounds normal. No stridor. No respiratory distress. He has no wheezes. He has no rales. He exhibits no tenderness.  Abdominal: Soft. Bowel sounds are normal. He exhibits no distension and no mass. There is no tenderness.  There is no rebound and no guarding.  Musculoskeletal: Normal range of motion. He exhibits no edema, tenderness or deformity.  Lymphadenopathy:    He has no cervical adenopathy.  Neurological: He is alert and oriented to person, place, and time. He has normal reflexes. He displays normal reflexes. No cranial nerve deficit. He exhibits normal muscle tone. Coordination normal.  Skin: Skin is warm. No rash noted. He is not diaphoretic. No erythema. No pallor.  Psychiatric: He has a normal mood and affect. His behavior is normal. Judgment and thought content normal.  Vitals reviewed.         Assessment & Plan:  Essential hypertension - Plan: CBC with Differential/Platelet, COMPLETE METABOLIC PANEL WITH GFR, Lipid panel  General  medical exam - Plan: CBC with Differential/Platelet, COMPLETE METABOLIC PANEL WITH GFR, Lipid panel, PSA, Hepatitis C Ab Reflex HCV RNA, QUANT, HIV antibody  Need for hepatitis C screening test - Plan: Hepatitis C Ab Reflex HCV RNA, QUANT  Prostate cancer screening - Plan: PSA  Patient declines a colonoscopy. He will think about it but at the present time he is not interested. He declines digital rectal exam. He will consent to a PSA. He would also like to be screened for HIV and hepatitis c.  We discussed the shingles vaccine but at the present time he deferred that. I asked him to return fasting for a CBC, CMP, fasting lipid panel, PSA. We'll start the patient on losartan 100 mg by mouth daily and recheck blood pressure in 3 weeks. There is also an atypical mole in the center of his back around the level of T7 that I would recommend biopsying at that time.

## 2017-04-28 ENCOUNTER — Encounter: Payer: Self-pay | Admitting: Family Medicine

## 2017-04-28 ENCOUNTER — Ambulatory Visit (INDEPENDENT_AMBULATORY_CARE_PROVIDER_SITE_OTHER): Payer: BLUE CROSS/BLUE SHIELD | Admitting: Family Medicine

## 2017-04-28 VITALS — BP 136/94 | HR 68 | Temp 97.8°F | Resp 14 | Ht 73.0 in | Wt 181.0 lb

## 2017-04-28 DIAGNOSIS — I10 Essential (primary) hypertension: Secondary | ICD-10-CM

## 2017-04-28 DIAGNOSIS — R634 Abnormal weight loss: Secondary | ICD-10-CM | POA: Diagnosis not present

## 2017-04-28 DIAGNOSIS — Z125 Encounter for screening for malignant neoplasm of prostate: Secondary | ICD-10-CM | POA: Diagnosis not present

## 2017-04-28 DIAGNOSIS — K529 Noninfective gastroenteritis and colitis, unspecified: Secondary | ICD-10-CM | POA: Diagnosis not present

## 2017-04-28 LAB — CBC WITH DIFFERENTIAL/PLATELET
BASOS PCT: 0 %
Basophils Absolute: 0 cells/uL (ref 0–200)
EOS ABS: 72 {cells}/uL (ref 15–500)
Eosinophils Relative: 1 %
HEMATOCRIT: 45.9 % (ref 38.5–50.0)
Hemoglobin: 15.3 g/dL (ref 13.0–17.0)
LYMPHS ABS: 1152 {cells}/uL (ref 850–3900)
Lymphocytes Relative: 16 %
MCH: 31.9 pg (ref 27.0–33.0)
MCHC: 33.3 g/dL (ref 32.0–36.0)
MCV: 95.8 fL (ref 80.0–100.0)
MPV: 8.9 fL (ref 7.5–12.5)
Monocytes Absolute: 864 cells/uL (ref 200–950)
Monocytes Relative: 12 %
NEUTROS ABS: 5112 {cells}/uL (ref 1500–7800)
Neutrophils Relative %: 71 %
Platelets: 352 10*3/uL (ref 140–400)
RBC: 4.79 MIL/uL (ref 4.20–5.80)
RDW: 13 % (ref 11.0–15.0)
WBC: 7.2 10*3/uL (ref 3.8–10.8)

## 2017-04-28 NOTE — Progress Notes (Signed)
Subjective:    Patient ID: Justin [name], male    DOB: Aug 22, 1956, 61 y.o.   MRN: [id number]  HPI  03/27/17 Patient is a very pleasant 61 year old white male here today to establish care. Past medical history is significant for hypertension. He has been off all blood pressure medication for more than a year. He discontinued amlodipine due to issues with being unable to sweat on the medication. He discontinued an ACE inhibitor in the past due to coughing. He has never had a colonoscopy. He is overdue for PSA. He is due for hepatitis C and HIV testing. Tetanus shot is up-to-date. He denies any medical concerns today.  At that time, my plan was: Patient declines a colonoscopy. He will think about it but at the present time he is not interested. He declines digital rectal exam. He will consent to a PSA. He would also like to be screened for HIV and hepatitis c.  We discussed the shingles vaccine but at the present time he deferred that. I asked him to return fasting for a CBC, CMP, fasting lipid panel, PSA. We'll start the patient on losartan 100 mg by mouth daily and recheck blood pressure in 3 weeks. There is also an atypical mole in the center of his back around the level of T7 that I would recommend biopsying at that time.  04/28/17 Blood pressure at home has been averaging between 937 and 169 systolic over 67-89 diastolic. He is tolerating the medication without problems. However he reports almost 3 months of chronic watery diarrhea. His wife states that his lost almost 20 pounds in the 3 months. As soon as he eats, he will develop bloating, gas, flatus, and watery diarrhea. He denies any abdominal pain nausea or vomiting. He denies any blood in his stool. He denies any melena. He denies any fevers or chills. He does report fatigue but there are no exacerbating foods. He denies any reflux. Denies any dysphasia. Past Medical History:  Diagnosis Date  . Anxiety   . Hypertension    Past Surgical  History:  Procedure Laterality Date  . KNEE SURGERY     tore mcl in high school (70's)   Current Outpatient Prescriptions on File Prior to Visit  Medication Sig Dispense Refill  . amLODipine (NORVASC) 10 MG tablet Take 10 mg by mouth daily.    Marland Kitchen aspirin EC 81 MG tablet Take 81 mg by mouth daily.    . cholecalciferol (VITAMIN D) 1000 UNITS tablet Take 2,000 Units by mouth daily.     Marland Kitchen losartan (COZAAR) 100 MG tablet Take 1 tablet (100 mg total) by mouth daily. 90 tablet 3  . vitamin C (ASCORBIC ACID) 500 MG tablet Take 500 mg by mouth daily.     No current facility-administered medications on file prior to visit.    No Known Allergies Social History   Social History  . Marital status: Married    Spouse name: N/A  . Number of children: N/A  . Years of education: N/A   Occupational History  . Not on file.   Social History Main Topics  . Smoking status: Never Smoker  . Smokeless tobacco: Never Used  . Alcohol use No  . Drug use: No  . Sexual activity: Not on file   Other Topics Concern  . Not on file   Social History Narrative  . No narrative on file   Family History  Problem Relation Age of Onset  . Tuberculosis Mother   .  Stroke Mother   . Heart disease Father        died at 79  . Bradycardia Brother        pacemaker  . Cancer Paternal Uncle   . Mental illness Paternal Grandfather        suicide at 83      Review of Systems  All other systems reviewed and are negative.      Objective:   Physical Exam  Constitutional: He is oriented to person, place, and time. He appears well-developed and well-nourished. No distress.  HENT:  Head: Normocephalic and atraumatic.  Right Ear: External ear normal.  Left Ear: External ear normal.  Nose: Nose normal.  Mouth/Throat: Oropharynx is clear and moist. No oropharyngeal exudate.  Eyes: Conjunctivae and EOM are normal. Pupils are equal, round, and reactive to light. Right eye exhibits no discharge. Left eye exhibits  no discharge. No scleral icterus.  Neck: Normal range of motion. Neck supple. No JVD present. No tracheal deviation present. No thyromegaly present.  Cardiovascular: Normal rate, regular rhythm, normal heart sounds and intact distal pulses.  Exam reveals no gallop and no friction rub.   No murmur heard. Pulmonary/Chest: Effort normal and breath sounds normal. No stridor. No respiratory distress. He has no wheezes. He has no rales. He exhibits no tenderness.  Abdominal: Soft. Bowel sounds are normal. He exhibits no distension and no mass. There is no tenderness. There is no rebound and no guarding.  Musculoskeletal: Normal range of motion. He exhibits no edema, tenderness or deformity.  Lymphadenopathy:    He has no cervical adenopathy.  Neurological: He is alert and oriented to person, place, and time. He has normal reflexes. No cranial nerve deficit. He exhibits normal muscle tone. Coordination normal.  Skin: Skin is warm. No rash noted. He is not diaphoretic. No erythema. No pallor.  Psychiatric: He has a normal mood and affect. His behavior is normal. Judgment and thought content normal.  Vitals reviewed.         Assessment & Plan:  Chronic diarrhea - Plan: CBC with Differential/Platelet, COMPLETE METABOLIC PANEL WITH GFR, Lipid panel, PSA, TSH, Celiac panel 10, Ambulatory referral to Gastroenterology  Weight loss - Plan: CBC with Differential/Platelet, COMPLETE METABOLIC PANEL WITH GFR, Lipid panel, PSA, TSH, Celiac panel 10, Ambulatory referral to Gastroenterology  Benign essential HTN - Plan: CBC with Differential/Platelet, COMPLETE METABOLIC PANEL WITH GFR, Lipid panel  Prostate cancer screening - Plan: PSA We will defer the biopsy of the mole on his back at the current time. I'm concerned about the chronic diarrhea particularly given the 20 pounds of weight loss. Differential diagnosis includes inflammatory bowel disease, celiac disease, hyperthyroidism, irritable bowel  syndrome,pancreatic insufficiency. Consult GI for possible colonoscopy. Meanwhile obtain CBC, CMP, fasting lipid panel. Goal LDL cholesterol is less than 100. Screen for prostate cancer with a PSA. Screen for celiac disease with a celiac panel. Screen for hyperthyroidism with a TSH.

## 2017-04-29 LAB — COMPLETE METABOLIC PANEL WITH GFR
ALT: 10 U/L (ref 9–46)
AST: 16 U/L (ref 10–35)
Albumin: 4.2 g/dL (ref 3.6–5.1)
Alkaline Phosphatase: 72 U/L (ref 40–115)
BILIRUBIN TOTAL: 0.9 mg/dL (ref 0.2–1.2)
BUN: 20 mg/dL (ref 7–25)
CALCIUM: 9.6 mg/dL (ref 8.6–10.3)
CO2: 22 mmol/L (ref 20–31)
CREATININE: 1.08 mg/dL (ref 0.70–1.25)
Chloride: 104 mmol/L (ref 98–110)
GFR, Est African American: 85 mL/min (ref >=60)
GFR, Est Non African American: 74 mL/min (ref >=60)
Glucose, Bld: 93 mg/dL (ref 70–99)
Potassium: 4.5 mmol/L (ref 3.5–5.3)
Sodium: 139 mmol/L (ref 135–146)
TOTAL PROTEIN: 6.8 g/dL (ref 6.1–8.1)

## 2017-04-29 LAB — LIPID PANEL
Cholesterol: 162 mg/dL (ref <200)
HDL: 47 mg/dL (ref >40)
LDL CALC: 105 mg/dL — AB (ref <100)
TRIGLYCERIDES: 50 mg/dL (ref <150)
Total CHOL/HDL Ratio: 3.4 Ratio (ref <5.0)
VLDL: 10 mg/dL (ref <30)

## 2017-04-29 LAB — TSH: TSH: 1.15 mIU/L (ref 0.40–4.50)

## 2017-04-29 LAB — PSA: PSA: 0.5 ng/mL (ref <=4.0)

## 2017-05-06 LAB — CELIAC PNL 2 RFLX ENDOMYSIAL AB TTR
(tTG) Ab, IgA: <1 U/mL
(tTG) Ab, IgG: 1 U/mL
Endomysial Ab IgA: NEGATIVE
GLIADIN(DEAM) AB,IGG: 2 U (ref <20)
Gliadin(Deam) Ab,IgA: 2 U (ref <20)
Immunoglobulin A: 133 mg/dL (ref 81–463)

## 2017-05-08 ENCOUNTER — Encounter (INDEPENDENT_AMBULATORY_CARE_PROVIDER_SITE_OTHER): Payer: Self-pay | Admitting: Internal Medicine

## 2017-05-08 ENCOUNTER — Encounter (INDEPENDENT_AMBULATORY_CARE_PROVIDER_SITE_OTHER): Payer: Self-pay | Admitting: *Deleted

## 2017-05-08 ENCOUNTER — Other Ambulatory Visit (INDEPENDENT_AMBULATORY_CARE_PROVIDER_SITE_OTHER): Payer: Self-pay | Admitting: Internal Medicine

## 2017-05-08 ENCOUNTER — Telehealth (INDEPENDENT_AMBULATORY_CARE_PROVIDER_SITE_OTHER): Payer: Self-pay | Admitting: *Deleted

## 2017-05-08 ENCOUNTER — Ambulatory Visit (INDEPENDENT_AMBULATORY_CARE_PROVIDER_SITE_OTHER): Payer: BLUE CROSS/BLUE SHIELD | Admitting: Internal Medicine

## 2017-05-08 VITALS — BP 142/80 | HR 64 | Temp 98.1°F | Ht 72.0 in | Wt 182.1 lb

## 2017-05-08 DIAGNOSIS — R634 Abnormal weight loss: Secondary | ICD-10-CM | POA: Diagnosis not present

## 2017-05-08 DIAGNOSIS — R197 Diarrhea, unspecified: Secondary | ICD-10-CM | POA: Diagnosis not present

## 2017-05-08 MED ORDER — PEG-KCL-NACL-NASULF-NA ASC-C 100 G PO SOLR: 1.0000 | Freq: Once | ORAL | 0 refills | Status: AC

## 2017-05-08 NOTE — Telephone Encounter (Signed)
Patient needs movi prep 

## 2017-05-08 NOTE — Patient Instructions (Signed)
The risks of bleeding, perforation and infection were reviewed with patient.  

## 2017-05-08 NOTE — Progress Notes (Signed)
   Subjective:    Patient ID: Justin [name], male    DOB: [birth year], 61 y.o.   MRN: [id number]  HPI Referred by Dr. Dennard Schaumann for chronic diarrhea. He has had diarrhea for several months. He will have the diarrhea randomly. Diarrhea sometimes will occur sometimes after he eats.  ON average he averages 1-2 stools a day sometimes 3 stools. Stools for the most part are loose. He has a lot of gas. No recent antibiotics. He says he has lost 20-30 pounds which was unintentional.  Has not seen any blood in his stools.   Declined rectal per records Has never had a colonoscopy.   No family hx of colon cancer.      04/28/2017 Celiac panel negative.03/27/2017 Hep C negative. HIV antibody neative. CMP Latest Ref Rng & Units 04/28/2017 05/20/2015 05/19/2015  Glucose 70 - 99 mg/dL 93 103(H) 117(H)  BUN 7 - 25 mg/dL 20 23(H) 33(H)  Creatinine 0.70 - 1.25 mg/dL 1.08 1.03 1.26(H)  Sodium 135 - 146 mmol/L 139 136 136  Potassium 3.5 - 5.3 mmol/L 4.5 4.2 3.5  Chloride 98 - 110 mmol/L 104 102 99(L)  CO2 20 - 31 mmol/L 22 28 26   Calcium 8.6 - 10.3 mg/dL 9.6 8.3(L) 9.2  Total Protein 6.1 - 8.1 g/dL 6.8 6.4(L) 7.3  Total Bilirubin 0.2 - 1.2 mg/dL 0.9 1.5(H) 1.9(H)  Alkaline Phos 40 - 115 U/L 72 45 52  AST 10 - 35 U/L 16 19 22   ALT 9 - 46 U/L 10 22 22     Review of Systems Past Medical History:  Diagnosis Date  . Anxiety   . Hypertension     Past Surgical History:  Procedure Laterality Date  . KNEE SURGERY     tore mcl in high school (70's)    No Known Allergies  Current Outpatient Prescriptions on File Prior to Visit  Medication Sig Dispense Refill  . aspirin EC 81 MG tablet Take 81 mg by mouth daily.    . cholecalciferol (VITAMIN D) 1000 UNITS tablet Take 2,000 Units by mouth daily.     Marland Kitchen losartan (COZAAR) 100 MG tablet Take 1 tablet (100 mg total) by mouth daily. 90 tablet 3  . vitamin C (ASCORBIC ACID) 500 MG tablet Take 500 mg by mouth daily.     No current facility-administered medications on  file prior to visit.         Objective:   Physical Exam Blood pressure (!) 142/80, pulse 64, temperature 98.1 F (36.7 C), height 6' (1.829 m), weight 182 lb 1.6 oz (82.6 kg).   Alert and oriented. Skin warm and dry. Oral mucosa is moist.   . Sclera anicteric, conjunctivae is pink. Thyroid not enlarged. No cervical lymphadenopathy. Lungs clear. Heart regular rate and rhythm.  Abdomen is soft. Bowel sounds are positive. No hepatomegaly. No abdominal masses felt. No tenderness.  No edema to lower extremities.  .     Assessment & Plan:  Weight loss ? Etiology. Diarrhea.  Colonic neoplasm, polyp needs to be ruled out. Colonoscopy.  Stool studies.  3 stool cards home with patient.

## 2017-05-12 ENCOUNTER — Encounter (INDEPENDENT_AMBULATORY_CARE_PROVIDER_SITE_OTHER): Payer: Self-pay | Admitting: *Deleted

## 2017-05-12 ENCOUNTER — Telehealth (INDEPENDENT_AMBULATORY_CARE_PROVIDER_SITE_OTHER): Payer: Self-pay | Admitting: *Deleted

## 2017-05-12 NOTE — Telephone Encounter (Signed)
Patient needs trilyte 

## 2017-05-15 LAB — GASTROINTESTINAL PATHOGEN PANEL PCR
C. DIFFICILE TOX A/B, PCR: NOT DETECTED
CAMPYLOBACTER, PCR: NOT DETECTED
CRYPTOSPORIDIUM, PCR: NOT DETECTED
E coli (ETEC) LT/ST PCR: NOT DETECTED
E coli (STEC) stx1/stx2, PCR: NOT DETECTED
E coli 0157, PCR: NOT DETECTED
Giardia lamblia, PCR: NOT DETECTED
NOROVIRUS, PCR: NOT DETECTED
Rotavirus A, PCR: NOT DETECTED
Salmonella, PCR: NOT DETECTED
Shigella, PCR: NOT DETECTED

## 2017-05-15 MED ORDER — PEG 3350-KCL-NA BICARB-NACL 420 G PO SOLR: 4000.0000 mL | Freq: Once | ORAL | 0 refills | Status: AC

## 2017-05-18 ENCOUNTER — Telehealth (INDEPENDENT_AMBULATORY_CARE_PROVIDER_SITE_OTHER): Payer: Self-pay | Admitting: Internal Medicine

## 2017-05-18 ENCOUNTER — Telehealth (INDEPENDENT_AMBULATORY_CARE_PROVIDER_SITE_OTHER): Payer: Self-pay | Admitting: *Deleted

## 2017-05-18 NOTE — Telephone Encounter (Signed)
   Diagnosis:    Result(s)   Card 1: Positive: 05/13/2017    Card 2: Negative:05/15/2017   Card 3: Positive:05/18/2017    Completed by: Thomas Hoff , LPN   HEMOCCULT SENSA DEVELOPER: LOT#:  [id number] EXPIRATION DATE: 2020/05   HEMOCCULT SENSA CARD:  LOT#:  [id number] EXPIRATION DATE: 05/20   CARD CONTROL RESULTS:  POSITIVE: Positive NEGATIVE: Negative    ADDITIONAL COMMENTS: Results forwarded to the ordering provider, Lelon Perla

## 2017-05-18 NOTE — Telephone Encounter (Signed)
Unable to leave message

## 2017-05-18 NOTE — Telephone Encounter (Signed)
Patient's wife presented to the office and stated that you had called the patient with stool card results, but asked him to call you.  Please call the patient.  [phone]

## 2017-05-18 NOTE — Telephone Encounter (Signed)
Hope I tried call patient but did not get answer. Let patient know his studies were negative. (stool)

## 2017-05-19 ENCOUNTER — Telehealth (INDEPENDENT_AMBULATORY_CARE_PROVIDER_SITE_OTHER): Payer: Self-pay | Admitting: Internal Medicine

## 2017-05-19 NOTE — Telephone Encounter (Signed)
TCS moved to 06/21/17 at [phone] ([phone]), left detailed message for patient

## 2017-05-19 NOTE — Telephone Encounter (Signed)
Results given to wife. Will try to have his colonoscopy moved up.

## 2017-05-19 NOTE — Telephone Encounter (Signed)
Justin [name], If possible, this patient needs to be moved sooner. + for blood and change in his stool.

## 2017-05-31 NOTE — Telephone Encounter (Signed)
I called the patient and informed him.

## 2017-06-21 ENCOUNTER — Encounter (HOSPITAL_COMMUNITY): Payer: Self-pay | Admitting: *Deleted

## 2017-06-21 ENCOUNTER — Encounter (HOSPITAL_COMMUNITY)
Admission: RE | Disposition: A | Discharge status: Home / Self Care | Payer: Self-pay | Source: Ambulatory Visit | Attending: Internal Medicine

## 2017-06-21 ENCOUNTER — Ambulatory Visit (HOSPITAL_COMMUNITY)
Admission: RE | Admit: 2017-06-21 | Discharge: 2017-06-21 | Disposition: A | Discharge status: Home / Self Care | Payer: BLUE CROSS/BLUE SHIELD | Source: Ambulatory Visit | Attending: Internal Medicine | Admitting: Internal Medicine

## 2017-06-21 DIAGNOSIS — Z7982 Long term (current) use of aspirin: Secondary | ICD-10-CM | POA: Insufficient documentation

## 2017-06-21 DIAGNOSIS — F419 Anxiety disorder, unspecified: Secondary | ICD-10-CM | POA: Insufficient documentation

## 2017-06-21 DIAGNOSIS — K648 Other hemorrhoids: Secondary | ICD-10-CM | POA: Insufficient documentation

## 2017-06-21 DIAGNOSIS — Z79899 Other long term (current) drug therapy: Secondary | ICD-10-CM | POA: Diagnosis not present

## 2017-06-21 DIAGNOSIS — K529 Noninfective gastroenteritis and colitis, unspecified: Secondary | ICD-10-CM | POA: Diagnosis not present

## 2017-06-21 DIAGNOSIS — K5730 Diverticulosis of large intestine without perforation or abscess without bleeding: Secondary | ICD-10-CM

## 2017-06-21 DIAGNOSIS — K5100 Ulcerative (chronic) pancolitis without complications: Secondary | ICD-10-CM | POA: Diagnosis not present

## 2017-06-21 DIAGNOSIS — R197 Diarrhea, unspecified: Secondary | ICD-10-CM | POA: Insufficient documentation

## 2017-06-21 DIAGNOSIS — R634 Abnormal weight loss: Secondary | ICD-10-CM | POA: Insufficient documentation

## 2017-06-21 DIAGNOSIS — Z6822 Body mass index (BMI) 22.0-22.9, adult: Secondary | ICD-10-CM | POA: Diagnosis not present

## 2017-06-21 DIAGNOSIS — I10 Essential (primary) hypertension: Secondary | ICD-10-CM | POA: Diagnosis not present

## 2017-06-21 DIAGNOSIS — Z9889 Other specified postprocedural states: Secondary | ICD-10-CM | POA: Diagnosis not present

## 2017-06-21 HISTORY — PX: BIOPSY: SHX5522

## 2017-06-21 HISTORY — DX: Diarrhea, unspecified: R19.7

## 2017-06-21 HISTORY — PX: COLONOSCOPY: SHX5424

## 2017-06-21 SURGERY — COLONOSCOPY
Anesthesia: Moderate Sedation

## 2017-06-21 MED ORDER — MIDAZOLAM HCL 5 MG/5ML IJ SOLN
INTRAMUSCULAR | Status: AC
  Filled 2017-06-21: qty 10

## 2017-06-21 MED ORDER — MEPERIDINE HCL 50 MG/ML IJ SOLN
INTRAMUSCULAR | Status: AC
  Filled 2017-06-21: qty 1

## 2017-06-21 MED ORDER — LIDOCAINE VISCOUS 2 % MT SOLN
OROMUCOSAL | Status: AC
  Filled 2017-06-21: qty 15

## 2017-06-21 MED ORDER — SODIUM CHLORIDE 0.9 % IV SOLN
INTRAVENOUS | Status: DC
  Administered 2017-06-21: 07:00:00 via INTRAVENOUS

## 2017-06-21 MED ORDER — STERILE WATER FOR IRRIGATION IR SOLN
Status: DC | PRN
  Administered 2017-06-21: 2.5 mL

## 2017-06-21 MED ORDER — MEPERIDINE HCL 50 MG/ML IJ SOLN
INTRAMUSCULAR | Status: DC | PRN
  Administered 2017-06-21 (×2): 25 mg via INTRAVENOUS

## 2017-06-21 MED ORDER — MIDAZOLAM HCL 5 MG/5ML IJ SOLN
INTRAMUSCULAR | Status: DC | PRN
  Administered 2017-06-21 (×3): 2 mg via INTRAVENOUS

## 2017-06-21 NOTE — H&P (Signed)
Justin [name] is an 61 y.o. male.   Chief Complaint: Patient is here for colonoscopy. HPI: A 61 year old Caucasian male who presents with over 3 month history of nonbloody diarrhea. Stool studies were negative. He has not tried any medication. He used to have 1 bowel movement like clockwork but not he is having to 4 per day. He denies abdominal pain or nocturnal diarrhea. He states he has lost 30 pounds despite having a good appetite. He has not taken any antibiotics recently or traveled outside the country. Family History is negative for IBD or CRC. This is patient's first colonoscopy.  Past Medical History:  Diagnosis Date  . Anxiety   . Diarrhea   . Hypertension     Past Surgical History:  Procedure Laterality Date  . KNEE SURGERY     tore mcl in high school (70's)    Family History  Problem Relation Age of Onset  . Tuberculosis Mother   . Stroke Mother   . Heart disease Father        died at 31  . Bradycardia Brother        pacemaker  . Cancer Paternal Uncle   . Mental illness Paternal Grandfather        suicide at 64  . Colon cancer Neg Hx    Social History:  reports that he has never smoked. He has never used smokeless tobacco. He reports that he does not drink alcohol or use drugs.  Allergies: No Known Allergies  Medications Prior to Admission  Medication Sig Dispense Refill  . aspirin EC 81 MG tablet Take 81 mg by mouth daily.    . cholecalciferol (VITAMIN D) 1000 UNITS tablet Take 2,000 Units by mouth daily.     Marland Kitchen losartan (COZAAR) 100 MG tablet Take 1 tablet (100 mg total) by mouth daily. 90 tablet 3  . naproxen sodium (ALEVE) 220 MG tablet Take 220 mg by mouth daily as needed.    . vitamin C (ASCORBIC ACID) 500 MG tablet Take 500 mg by mouth daily.      No results found for this or any previous visit (from the past 48 hour(s)). No results found.  ROS  Blood pressure (!) 145/97, pulse (!) 101, temperature 97.7 F (36.5 C), temperature source Oral, resp.  rate 16, height 6' 1"  (1.854 m), weight 169 lb (76.7 kg), SpO2 100 %. Physical Exam  Constitutional: He appears well-developed.  HENT:  Mouth/Throat: Oropharynx is clear and moist.  Eyes: Conjunctivae are normal. No scleral icterus.  Neck: No thyromegaly present.  Cardiovascular: Normal rate, regular rhythm and normal heart sounds.   No murmur heard. Respiratory: Effort normal and breath sounds normal.  GI: Soft. He exhibits no distension and no mass. There is no tenderness.  Large right hernia which is soft and nontender.  Musculoskeletal: He exhibits no edema.  Lymphadenopathy:    He has no cervical adenopathy.  Neurological: He is alert.  Skin: Skin is warm and dry.     Assessment/Plan Unexplained diarrhea with weight loss. Diagnostic colonoscopy.  Hildred Laser, MD 06/21/2017, 7:37 AM

## 2017-06-21 NOTE — Op Note (Signed)
[facility] Patient Name: Justin [name] Procedure Date: 06/21/2017 7:23 AM MRN: [id number] Date of Birth: [birth year] Attending MD: Hildred Laser , MD CSN: [id number] Age: 61 Admit Type: Outpatient Procedure:                Colonoscopy Indications:              Clinically significant diarrhea of unexplained                            origin Providers:                Hildred Laser, MD, Lurline Del, RN, Rosina Lowenstein, RN Referring MD:             [name]. Pickard, MD Medicines:                Meperidine 50 mg IV, Midazolam 6 mg IV Complications:            No immediate complications. Estimated Blood Loss:     Estimated blood loss was minimal. Procedure:                Pre-Anesthesia Assessment:                           - Prior to the procedure, a History and Physical                            was performed, and patient medications and                            allergies were reviewed. The patient's tolerance of                            previous anesthesia was also reviewed. The risks                            and benefits of the procedure and the sedation                            options and risks were discussed with the patient.                            All questions were answered, and informed consent                            was obtained. Prior Anticoagulants: The patient                            last took aspirin 2 days and naproxen 7 days prior                            to the procedure. ASA Grade Assessment: I - A                            normal, healthy patient. After reviewing the risks  and benefits, the patient was deemed in                            satisfactory condition to undergo the procedure.                           After obtaining informed consent, the colonoscope                            was passed under direct vision. Throughout the                            procedure, the patient's blood pressure, pulse, and                    oxygen saturations were monitored continuously. The                            [id number] ([id number]) was introduced through the                            anus and advanced to the the terminal ileum, with                            identification of the appendiceal orifice and IC                            valve. The colonoscopy was performed without                            difficulty. The patient tolerated the procedure                            well. The quality of the bowel preparation was                            excellent. The terminal ileum, ileocecal valve,                            appendiceal orifice, and rectum were photographed. Scope In: 7:47:03 AM Scope Out: 8:09:52 AM Scope Withdrawal Time: 0 hours 16 minutes 2 seconds  Total Procedure Duration: 0 hours 22 minutes 49 seconds  Findings:      The perianal and digital rectal examinations were normal.      The terminal ileum appeared normal.      Inflammation characterized by congestion (edema), erythema, friability,       granularity and shallow ulcerations was found. The entire colon was       spared. This was moderate in severity. Biopsies were taken with a cold       forceps for histology. The pathology specimen was placed into Bottle       Number 1( proximal colon0. The pathology specimen was placed into Bottle       Number 2(left colon). The pathology specimen was placed into Bottle       Number 3(rectum)      A few medium-mouthed diverticula were  found in the sigmoid colon.      Internal hemorrhoids were found during retroflexion. The hemorrhoids       were small. Impression:               - The examined portion of the ileum was normal.                           - Endoscopic appearance consistent with pancolitis                            ulcerative colitis. Inflammatory changes more                            pronounced in proximal colon. This was moderate in                            severity.  Biopsied.                           - Diverticulosis in the sigmoid colon.                           - Internal hemorrhoids.                           - Stool specimen obtained for O and P. Moderate Sedation:      Moderate (conscious) sedation was administered by the endoscopy nurse       and supervised by the endoscopist. The following parameters were       monitored: oxygen saturation, heart rate, blood pressure, CO2       capnography and response to care. Total physician intraservice time was       28 minutes. Recommendation:           - Patient has a contact number available for                            emergencies. The signs and symptoms of potential                            delayed complications were discussed with the                            patient. Return to normal activities tomorrow.                            Written discharge instructions were provided to the                            patient.                           - Resume previous diet today.                           - Continue present medications.                           -  Await pathology results.                           - No recommendation at this time regarding repeat                            colonoscopy.                           - Procedure Code(s):        --- Professional ---                           [phone], Colonoscopy, flexible; with biopsy, single                            or multiple                           99152, Moderate sedation services provided by the                            same physician or other qualified health care                            professional performing the diagnostic or                            therapeutic service that the sedation supports,                            requiring the presence of an independent trained                            observer to assist in the monitoring of the                            patient's level of consciousness and physiological                             status; initial 15 minutes of intraservice time,                            patient age 73 years or older                           [phone], Moderate sedation services; each additional                            15 minutes intraservice time Diagnosis Code(s):        --- Professional ---                           K64.8, Other hemorrhoids                           K51.00, Ulcerative (chronic)  pancolitis without                            complications                           R19.7, Diarrhea, unspecified                           K57.30, Diverticulosis of large intestine without                            perforation or abscess without bleeding CPT copyright 2016 American Medical Association. All rights reserved. The codes documented in this report are preliminary and upon coder review may  be revised to meet current compliance requirements. Hildred Laser, MD Hildred Laser, MD 06/21/2017 8:24:10 AM This report has been signed electronically. Number of Addenda: 0

## 2017-06-21 NOTE — Discharge Instructions (Signed)
Colonoscopy, Adult, Care After This sheet gives you information about how to care for yourself after your procedure. Your health care provider may also give you more specific instructions. If you have problems or questions, contact your health care provider. What can I expect after the procedure? After the procedure, it is common to have:  A small amount of blood in your stool for 24 hours after the procedure.  Some gas.  Mild abdominal cramping or bloating.  Follow these instructions at home: General instructions   For the first 24 hours after the procedure: ? Do not drive or use machinery. ? Do not sign important documents. ? Do not drink alcohol. ? Do your regular daily activities at a slower pace than normal. ? Eat soft, easy-to-digest foods. ? Rest often.  Take over-the-counter or prescription medicines only as told by your health care provider.  It is up to you to get the results of your procedure. Ask your health care provider, or the department performing the procedure, when your results will be ready. Relieving cramping and bloating  Try walking around when you have cramps or feel bloated.  Apply heat to your abdomen as told by your health care provider. Use a heat source that your health care provider recommends, such as a moist heat pack or a heating pad. ? Place a towel between your skin and the heat source. ? Leave the heat on for 20-30 minutes. ? Remove the heat if your skin turns bright red. This is especially important if you are unable to feel pain, heat, or cold. You may have a greater risk of getting burned. Eating and drinking  Drink enough fluid to keep your urine clear or pale yellow.  Resume your normal diet as instructed by your health care provider. Avoid heavy or fried foods that are hard to digest.  Avoid drinking alcohol for as long as instructed by your health care provider. Contact a health care provider if:  You have blood in your stool 2-3  days after the procedure. Get help right away if:  You have more than a small spotting of blood in your stool.  You pass large blood clots in your stool.  Your abdomen is swollen.  You have nausea or vomiting.  You have a fever.  You have increasing abdominal pain that is not relieved with medicine. This information is not intended to replace advice given to you by your health care provider. Make sure you discuss any questions you have with your health care provider. Document Released: 06/07/2004 Document Revised: 07/18/2016 Document Reviewed: 01/05/2016 Elsevier Interactive Patient Education  2018 [place]. Ulcerative Colitis, Adult Ulcerative colitis is long-lasting (chronic) swelling (inflammation) of the large intestine (colon). Sores (ulcers) may also form on the colon. Ulcerative colitis is closely related to another condition of inflammation of the intestines that is called Crohn disease. Together, they are frequently referred to as inflammatory bowel disease (IBD). What are the causes? Ulcerative colitis is caused by increased activity of the immune system in the intestines. The immune system is the system that protects the body against harmful bacteria, viruses, fungi, and other things that can make you sick. When the immune system overacts, it causes inflammation. The cause of the increased immune system activity is not known. What increases the risk? Risk factors of ulcerative colitis include:  Age. This includes: ? Being 64-56 years old. ? Being older than 62 years old.  Having a family history of ulcerative colitis.  Being of Jewish  descent.  What are the signs or symptoms? Common symptoms of ulcerative colitis include rectal bleeding and diarrhea. There is a wide range of symptoms, and a person's symptoms depend on how severe the condition is. Additional symptoms may include:  Pain or cramping in the belly (abdomen).  Fever.  Fatigue.  Weight  loss.  Night sweats.  Rectal pain.  Feeling the immediate need to have a bowel movement.  Nausea.  Loss of appetite.  Anemia.  Joint pain or soreness.  Eye irritation.  Certain skin rashes.  How is this diagnosed? Ulcerative colitis may be diagnosed by:  Medical history and physical exam.  Blood tests and stool tests.  X-rays.  CT scans.  Colonoscopy. For this test, a flexible tube is inserted into your anus and your colon is examined.  Examination of a tissue sample from your colon (biopsy).  How is this treated? Treatment for ulcerative colitis may include medicines to:  Decrease inflammation.  Control your immune system.  Surgery may also be necessary. Follow these instructions at home: Medicines and vitamins  Take medicines only as directed by your doctor. Do not take aspirin.  Ask your doctor if you should take any vitamins or supplements. Lifestyle  Exercise regularly.  Limit alcohol intake to no more than 1 drink per day for nonpregnant women and 2 drinks per day for men. One drink equals 12 ounces of beer, 5 ounces of wine, or 1 ounces of hard liquor. Eating and drinking  Drink enough fluid to keep your urine clear or pale yellow.  Ask your health care provider about the best diet for you. Follow the diet as directed by your health care provider. This may include: ? Avoiding carbonated drinks. ? Avoiding popcorn, vegetable skins, nuts, and other high-fiber foods when you have symptoms of ulcerative colitis. ? Eating smaller meals more often. ? Keeping a food diary. This may help you to find and avoid any foods that make you feel not well.  Limit your caffeine intake. General instructions  Keep all follow-up appointments as directed by your health care provider. This is important. Contact a health care provider if:  Your symptoms do not improve or get worse with treatment.  You continue to lose weight.  You have constant cramps or  loose bowels.  You develop a new skin rash, skin sores, or eye problems.  You have a fever or chills. Get help right away if:  You have bloody diarrhea.  You have severe pain in your abdomen.  You vomit. This information is not intended to replace advice given to you by your health care provider. Make sure you discuss any questions you have with your health care provider. Document Released: 08/03/2005 Document Revised: 06/26/2016 Document Reviewed: 02/16/2015 Elsevier Interactive Patient Education  2018 [place] usual medications and diet. No driving for 24 hours. Physician will call with biopsy results and further recommendations.

## 2017-06-22 ENCOUNTER — Encounter (HOSPITAL_COMMUNITY): Payer: Self-pay | Admitting: Internal Medicine

## 2017-06-22 ENCOUNTER — Other Ambulatory Visit (INDEPENDENT_AMBULATORY_CARE_PROVIDER_SITE_OTHER): Payer: Self-pay | Admitting: Internal Medicine

## 2017-06-22 MED ORDER — MESALAMINE 1.2 G PO TBEC: 1.2000 g | DELAYED_RELEASE_TABLET | Freq: Every day | ORAL | 3 refills | Status: DC

## 2017-06-22 MED ORDER — PREDNISONE 10 MG PO TABS: 30.0000 mg | ORAL_TABLET | Freq: Every day | ORAL | 0 refills | Status: DC

## 2017-06-29 LAB — OVA + PARASITE EXAM

## 2017-06-29 LAB — O&P RESULT

## 2017-07-05 ENCOUNTER — Other Ambulatory Visit (INDEPENDENT_AMBULATORY_CARE_PROVIDER_SITE_OTHER): Payer: Self-pay | Admitting: *Deleted

## 2017-07-13 NOTE — Progress Notes (Signed)
I've sent a staff message to [facility] to find a spot for this patient.

## 2017-07-26 ENCOUNTER — Encounter (INDEPENDENT_AMBULATORY_CARE_PROVIDER_SITE_OTHER): Payer: Self-pay | Admitting: Internal Medicine

## 2017-09-19 ENCOUNTER — Ambulatory Visit (INDEPENDENT_AMBULATORY_CARE_PROVIDER_SITE_OTHER): Payer: BLUE CROSS/BLUE SHIELD | Admitting: Internal Medicine

## 2017-09-19 ENCOUNTER — Encounter (INDEPENDENT_AMBULATORY_CARE_PROVIDER_SITE_OTHER): Payer: Self-pay

## 2017-09-19 ENCOUNTER — Encounter (INDEPENDENT_AMBULATORY_CARE_PROVIDER_SITE_OTHER): Payer: Self-pay | Admitting: Internal Medicine

## 2017-09-19 VITALS — BP 130/84 | HR 65 | Temp 97.0°F | Resp 18 | Ht 72.0 in | Wt 188.5 lb

## 2017-09-19 DIAGNOSIS — K5100 Ulcerative (chronic) pancolitis without complications: Secondary | ICD-10-CM

## 2017-09-19 MED ORDER — MESALAMINE 400 MG PO CPDR: 800.0000 mg | DELAYED_RELEASE_CAPSULE | Freq: Two times a day (BID) | ORAL | 11 refills | Status: DC

## 2017-09-19 NOTE — Patient Instructions (Signed)
Notify if you have rectal bleeding or diarrhea.

## 2017-09-19 NOTE — Progress Notes (Signed)
Presenting complaint;  Follow-up for ulcerative colitis.  Subjective:  Justin [name] 61 year old Caucasian male who underwent colonoscopy about 3 months ago for chronic diarrhea and heme positive stool.  He was found to have pan ulcerative colitis and was begun on oral mesalamine.  He is only taking 1 tablet of Lialda daily.  He is supposed to be taking 2 twice daily.  However he feels much better.  He is having 1 formed stool daily.  He denies abdominal pain melena or rectal bleeding.  He still has not proceeded with surgical consultation for right inguinal hernia.  He is not having any pain.  He states hernia is always there. He states his co-pay for this medication is ranged between 60 and $240 a month. He is not having any side effects.   Current Medications: Outpatient Encounter Medications as of 09/19/2017  Medication Sig  . aspirin EC 81 MG tablet Take 1 tablet (81 mg total) by mouth daily.  . Cholecalciferol (VITAMIN D PO) Take 50 Units daily by mouth.   . diphenhydrAMINE (BENADRYL) 25 MG tablet Take 25 mg at bedtime as needed by mouth for sleep.  Marland Kitchen losartan (COZAAR) 100 MG tablet Take 1 tablet (100 mg total) by mouth daily.  . mesalamine (LIALDA) 1.2 g EC tablet Take 1 tablet (1.2 g total) by mouth daily with breakfast.  . naproxen sodium (ALEVE) 220 MG tablet Take 220 mg by mouth daily as needed.  . vitamin C (ASCORBIC ACID) 500 MG tablet Take 500 mg by mouth daily.  . [DISCONTINUED] predniSONE (DELTASONE) 10 MG tablet Take 3 tablets (30 mg total) by mouth daily with breakfast. (Patient not taking: Reported on 09/19/2017)   No facility-administered encounter medications on file as of 09/19/2017.      Objective: Blood pressure 130/84, pulse 65, temperature (!) 97 F (36.1 C), temperature source Oral, resp. rate 18, height 6' (1.829 m), weight 188 lb 8 oz (85.5 kg). The patient is alert and in no acute distress. Conjunctiva is pink. Sclera is nonicteric Oropharyngeal mucosa is  normal. No neck masses or thyromegaly noted. Cardiac exam with regular rhythm normal S1 and S2. No murmur or gallop noted. Lungs are clear to auscultation. Abdomen is symmetrical.  It is soft and nontender without organomegaly or masses.  He has large right inguinal hernia which is soft and nontender and appears to contain bowel. No LE edema or clubbing noted.   Assessment:  #1.  Pain ulcerative colitis.  His condition was diagnosed 3 months ago.  He is not taking correct dose of oral mesalamine but he appears to be in remission.  Co-pay is too high and will just switch him to another oral mesalamine.  #2.  Right inguinal hernia.  Hernia is quite large.  I do not believe he has narrow neck and therefore not at risk for obstruction or strangulation but he needs to proceed with herniorrhaphy.  He will talk with Dr. Dennard Schaumann about surgical consultation.   Plan:  Discontinue Lialda. Begin Delzicol 800 mg p.o. twice daily.  New prescription given.  If it is not cost effective we will switch him to yet another agent. Unless he has problems he will return for office visit in 1 year.

## 2017-10-09 ENCOUNTER — Encounter: Payer: Self-pay | Admitting: Family Medicine

## 2017-10-09 ENCOUNTER — Ambulatory Visit (INDEPENDENT_AMBULATORY_CARE_PROVIDER_SITE_OTHER): Payer: BLUE CROSS/BLUE SHIELD | Admitting: Family Medicine

## 2017-10-09 VITALS — BP 144/98 | HR 72 | Temp 97.9°F | Resp 14 | Ht 73.0 in | Wt 190.0 lb

## 2017-10-09 DIAGNOSIS — G4733 Obstructive sleep apnea (adult) (pediatric): Secondary | ICD-10-CM

## 2017-10-09 DIAGNOSIS — K4090 Unilateral inguinal hernia, without obstruction or gangrene, not specified as recurrent: Secondary | ICD-10-CM | POA: Diagnosis not present

## 2017-10-10 NOTE — Progress Notes (Signed)
Subjective:    Patient ID: Justin [name], male    DOB: [birth year], 61 y.o.   MRN: [id number]  HPI  Patient has a long-standing history of a large right-sided inguinal hernia.  He has had it for more than a decades  However, recently it began to grow and the patient is now interested in having it treated surgically.  He denies any change in his bowel movements.  He denies any symptoms of incarceration.  He denies any abdominal pain or fevers.  He denies any blood in stool.  On exam, the patient has a extremely large right-sided inguinal hernia that is nonreducible filling the scrotum.  Hernia is roughly the size of a softball.  There are no testicular masses.  There is no inguinal lymphadenopathy that is appreciable.  He also presents with his wife concerned about obstructive sleep apnea.  She states that every night, she will witnessed him stop breathing when he falls asleep.  He snores extremely loudly.  He endorses hypersomnolence.  He has fallen asleep during conversation.  He has fallen asleep while sending a text.  He can easily fall asleep after eating a lunch.  He can easily fall asleep while reading a book or watching TV.  His Epworth sleepiness score is greater than 9.  He states that it is possible he could even fall asleep while driving. Past Medical History:  Diagnosis Date  . Anxiety   . Diarrhea   . Hypertension    Past Surgical History:  Procedure Laterality Date  . BIOPSY  06/21/2017   Procedure: BIOPSY;  Surgeon: Rogene Houston, MD;  Location: [facility];  Service: Endoscopy;;  rectal, righta and left colon;  . COLONOSCOPY N/A 06/21/2017   Procedure: COLONOSCOPY;  Surgeon: Rogene Houston, MD;  Location: [facility];  Service: Endoscopy;  Laterality: N/A;  1:25  . KNEE SURGERY     tore mcl in high school (70's)   Current Outpatient Medications on File Prior to Visit  Medication Sig Dispense Refill  . aspirin EC 81 MG tablet Take 1 tablet (81 mg total) by mouth daily.      . Cholecalciferol (VITAMIN D PO) Take 50 Units daily by mouth.     . diphenhydrAMINE (BENADRYL) 25 MG tablet Take 25 mg at bedtime as needed by mouth for sleep.    Marland Kitchen losartan (COZAAR) 100 MG tablet Take 1 tablet (100 mg total) by mouth daily. 90 tablet 3  . Mesalamine (ASACOL) 400 MG CPDR DR capsule Take 2 capsules (800 mg total) 2 (two) times daily by mouth. 120 capsule 11  . naproxen sodium (ALEVE) 220 MG tablet Take 220 mg by mouth daily as needed.    . vitamin C (ASCORBIC ACID) 500 MG tablet Take 500 mg by mouth daily.     No current facility-administered medications on file prior to visit.    No Known Allergies Social History   Socioeconomic History  . Marital status: Married    Spouse name: Not on file  . Number of children: Not on file  . Years of education: Not on file  . Highest education level: Not on file  Social Needs  . Financial resource strain: Not on file  . Food insecurity - worry: Not on file  . Food insecurity - inability: Not on file  . Transportation needs - medical: Not on file  . Transportation needs - non-medical: Not on file  Occupational History  . Not on file  Tobacco Use  .  Smoking status: Never Smoker  . Smokeless tobacco: Never Used  Substance and Sexual Activity  . Alcohol use: No    Alcohol/week: 0.0 oz  . Drug use: No  . Sexual activity: Not on file  Other Topics Concern  . Not on file  Social History Narrative  . Not on file   Family History  Problem Relation Age of Onset  . Tuberculosis Mother   . Stroke Mother   . Heart disease Father        died at 87  . Bradycardia Brother        pacemaker  . Cancer Paternal Uncle   . Mental illness Paternal Grandfather        suicide at 42  . Colon cancer Neg Hx       Review of Systems  All other systems reviewed and are negative.      Objective:   Physical Exam  Constitutional: He is oriented to person, place, and time. He appears well-developed and well-nourished. No distress.   HENT:  Head: Normocephalic and atraumatic.  Right Ear: External ear normal.  Left Ear: External ear normal.  Nose: Nose normal.  Mouth/Throat: Oropharynx is clear and moist. No oropharyngeal exudate.  Eyes: Conjunctivae and EOM are normal. Pupils are equal, round, and reactive to light. Right eye exhibits no discharge. Left eye exhibits no discharge. No scleral icterus.  Neck: Normal range of motion. Neck supple. No JVD present. No tracheal deviation present. No thyromegaly present.  Cardiovascular: Normal rate, regular rhythm, normal heart sounds and intact distal pulses. Exam reveals no gallop and no friction rub.  No murmur heard. Pulmonary/Chest: Effort normal and breath sounds normal. No stridor. No respiratory distress. He has no wheezes. He has no rales. He exhibits no tenderness.  Abdominal: Soft. Bowel sounds are normal. He exhibits no distension and no mass. There is no tenderness. There is no rebound and no guarding. A hernia is present. Hernia confirmed positive in the right inguinal area. Hernia confirmed negative in the left inguinal area.  Genitourinary: Penis normal.    Right testis shows no mass and no tenderness. Left testis shows no mass and no tenderness. Uncircumcised.  Musculoskeletal: Normal range of motion. He exhibits no edema, tenderness or deformity.  Lymphadenopathy:    He has no cervical adenopathy.       Right: No inguinal adenopathy present.       Left: No inguinal adenopathy present.  Neurological: He is alert and oriented to person, place, and time. He has normal reflexes. No cranial nerve deficit. He exhibits normal muscle tone. Coordination normal.  Skin: Skin is warm. No rash noted. He is not diaphoretic. No erythema. No pallor.  Psychiatric: He has a normal mood and affect. His behavior is normal. Judgment and thought content normal.  Vitals reviewed.  Large inguinal hernia diagrammed in red      Assessment & Plan:  Unilateral inguinal hernia  without obstruction or gangrene, recurrence not specified - Plan: Ambulatory referral to General Surgery  Obstructive sleep apnea syndrome - Plan: Ambulatory referral to Sleep Studies Patient certainly has symptoms of obstructive sleep apnea.  Wife has witnessed apneic episodes.  Therefore I will consult neurology for an overnight sleep study to evaluate further.  He has an extremely large right inguinal hernia.  I have recommended immediate general surgery consultation.

## 2017-11-16 ENCOUNTER — Institutional Professional Consult (permissible substitution): Payer: BLUE CROSS/BLUE SHIELD | Admitting: Neurology

## 2017-11-16 ENCOUNTER — Encounter: Payer: Self-pay | Admitting: General Surgery

## 2017-11-16 ENCOUNTER — Ambulatory Visit: Payer: BC Managed Care – PPO | Admitting: General Surgery

## 2017-11-16 ENCOUNTER — Ambulatory Visit: Payer: BLUE CROSS/BLUE SHIELD | Admitting: General Surgery

## 2017-11-16 VITALS — BP 168/111 | HR 75 | Temp 99.3°F | Ht 73.0 in | Wt 192.0 lb

## 2017-11-16 DIAGNOSIS — K4090 Unilateral inguinal hernia, without obstruction or gangrene, not specified as recurrent: Secondary | ICD-10-CM

## 2017-11-16 NOTE — Progress Notes (Signed)
Justin [name]; [id number]; Dec 03, 1955   HPI Patient is a 62 year old white male who was referred to my care by Dr. Dennard Schaumann for evaluation and treatment of a right inguinal hernia.  Patient states the hernia has been present for over 10 years.  It has always been large in size.  It is so large that sometimes gets in his way with movement, but he has had no nausea or vomiting.  He currently has 0 out of 10 abdominal pain.  He also is being evaluated for left knee pain which has been chronic in nature.  He is seeing an orthopedic surgeon later today. Past Medical History:  Diagnosis Date  . Anxiety   . Diarrhea   . Hypertension     Past Surgical History:  Procedure Laterality Date  . BIOPSY  06/21/2017   Procedure: BIOPSY;  Surgeon: Rogene Houston, MD;  [facility];  Service: Endoscopy;;  rectal, righta and left colon;  . COLONOSCOPY N/A 06/21/2017   Procedure: COLONOSCOPY;  Surgeon: Rogene Houston, MD;  Location: [facility];  Service: Endoscopy;  Laterality: N/A;  1:25  . KNEE SURGERY     tore mcl in high school (70's)    Family History  Problem Relation Age of Onset  . Tuberculosis Mother   . Stroke Mother   . Heart disease Father        died at 9  . Bradycardia Brother        pacemaker  . Cancer Paternal Uncle   . Mental illness Paternal Grandfather        suicide at 80  . Colon cancer Neg Hx     Current Outpatient Medications on File Prior to Visit  Medication Sig Dispense Refill  . aspirin EC 81 MG tablet Take 1 tablet (81 mg total) by mouth daily.    . Cholecalciferol (VITAMIN D PO) Take 50 Units daily by mouth.     . diphenhydrAMINE (BENADRYL) 25 MG tablet Take 25 mg at bedtime as needed by mouth for sleep.    Marland Kitchen losartan (COZAAR) 100 MG tablet Take 1 tablet (100 mg total) by mouth daily. 90 tablet 3  . Mesalamine (ASACOL) 400 MG CPDR DR capsule Take 2 capsules (800 mg total) 2 (two) times daily by mouth. 120 capsule 11  . naproxen sodium (ALEVE) 220 MG  tablet Take 220 mg by mouth daily as needed.    . vitamin C (ASCORBIC ACID) 500 MG tablet Take 500 mg by mouth daily.     No current facility-administered medications on file prior to visit.     No Known Allergies  Social History   Substance and Sexual Activity  Alcohol Use No  . Alcohol/week: 0.0 oz    Social History   Tobacco Use  Smoking Status Never Smoker  Smokeless Tobacco Never Used    Review of Systems  Constitutional: Negative.   HENT: Positive for sinus pain.   Eyes: Negative.   Respiratory: Negative.   Cardiovascular: Negative.   Gastrointestinal: Negative.   Genitourinary: Negative.   Musculoskeletal: Positive for joint pain.  Skin: Negative.   Neurological: Negative.   Endo/Heme/Allergies: Negative.   Psychiatric/Behavioral: Negative.     Objective   Vitals:   11/16/17 1119  BP: (!) 168/111  Pulse: 75  Temp: 99.3 F (37.4 C)    Physical Exam  Constitutional: He is oriented to person, place, and time and well-developed, well-nourished, and in no distress.  HENT:  Head: Normocephalic and atraumatic.  Cardiovascular: Normal rate, regular rhythm and normal heart sounds. Exam reveals no gallop and no friction rub.  No murmur heard. Pulmonary/Chest: Effort normal and breath sounds normal. No respiratory distress. He has no wheezes. He has no rales.  Abdominal: Soft. Bowel sounds are normal. He exhibits no distension. There is no tenderness. There is no rebound.  Very large right inguinal hernia with bowel extending into the scrotal sac.  Would be easily reducible, although it would come right back out given the size of the hernia defect.  No obvious hydrocele present.  Neurological: He is alert and oriented to person, place, and time.  Skin: Skin is warm and dry.  Vitals reviewed.   Assessment  Large right inguinal hernia containing bowel, asymptomatic Plan   The risk of incarceration is low given the size of the hernia.  He is not particularly  symptomatic with the hernia.  He is evaluating his options as to whether to get the hernia fixed or to get his left knee repaired as this is bothering him more.  I told him I would be more than happy to fix the hernia, but it could not be done at the same time as his knee surgery.  Literature was given.  He will think about it.  Follow-up as needed.

## 2017-11-16 NOTE — Patient Instructions (Signed)

## 2017-11-30 ENCOUNTER — Ambulatory Visit: Payer: BLUE CROSS/BLUE SHIELD | Admitting: Neurology

## 2017-11-30 ENCOUNTER — Encounter: Payer: Self-pay | Admitting: Neurology

## 2017-11-30 VITALS — BP 155/89 | HR 77 | Ht 73.0 in | Wt 193.0 lb

## 2017-11-30 DIAGNOSIS — J3089 Other allergic rhinitis: Secondary | ICD-10-CM | POA: Diagnosis not present

## 2017-11-30 DIAGNOSIS — G4710 Hypersomnia, unspecified: Secondary | ICD-10-CM

## 2017-11-30 DIAGNOSIS — G4719 Other hypersomnia: Secondary | ICD-10-CM

## 2017-11-30 DIAGNOSIS — G4730 Sleep apnea, unspecified: Secondary | ICD-10-CM

## 2017-11-30 MED ORDER — TRIAMCINOLONE ACETONIDE 55 MCG/ACT NA AERO: 2.0000 | INHALATION_SPRAY | Freq: Every day | NASAL | 12 refills | Status: DC

## 2017-11-30 NOTE — Progress Notes (Signed)
[facility]   Provider:  [name], [place] D  Primary Care Physician:  [name], MD   Referring Provider: [name], MD   Chief Complaint  Justin [name] presents with  . New Justin [name] (Initial Visit)    pt with wife, she states that he will fall asleep in chair, snores in sleep and stops breathing, likes to sleep in Justin recliner, wakes up confused. tired all Justin time.     HPI:  Justin Justin [name] is a 62 y.o. male , seen here as in a referral from Dr. Dennard Schaumann for a sleep consult.   I have Justin pleasure of meeting Mr. and Mrs. Justin [name] today, Justin Justin [name] is referred for evaluation of excessive daytime sleepiness, witnessed apneas, and some confusion when aroused from sleep. Justin Justin [name] has no significant medical history, his only diagnosis is a recent hernia and at one time he was evaluated by cardiology with a cardiac echo that returned normal.  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 in Justin day may be followed by several hours in Justin office. Justin Justin [name] has developed over Justin last years a habit of falling asleep in Justin den watching TV.  He may sleep there already 2 hours in a recliner before he will go over to Justin bedroom.  Justin couple at bedtime is now around 1130.  Justin bedroom is cool quiet and dark, Justin Justin [name] sleeps on a flat mattress, he sleeps on one pillow for head support one pillow between his knees and rest on his side. He will turned to supine sleep soon after he is asleep.  When he snores loudest, and that is when apneas have been witnessed. He will have always 1 sometimes 2 or 3 bathroom breaks depending on fluid intake, his first bathroom break is usually around 3:00 AM. He does not recall vivid or lucid dreams and there is no dream enactment.  He wakes up spontaneously not depending on work  requirements, he seems to wake up spontaneously between 3 and 5 AM and often has difficulties to go back to sleep.  In that case he usually goes back to his recliner.  He estimates that he gets only 4-5 hours of nocturnal sleep and some nights less. He has not fallen asleep at job sites, but once he is no longer physically active or mentally stimulated he struggles to stay awake. On a week end he may take a nap- 20 minute power to 1 hour.  Sleep medical history and family sleep history:  His father died young - heavy smoker , blood clot at 22, his mother at 64, he was not aware of any sleep disorders in his parents.  Justin Justin [name] has 1 older brother, unaware of any sleep issues.  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  He has never used tobacco products, he does not consume alcohol, but caffeine : Drinks colas, but not more than 1 a day.  On occasion iced tea, not coffee.  He does not drink coffee but decaffeinated tea at home.  Review of Systems: Out of a complete 14 system review, Justin Justin [name] complains of only Justin following symptoms, and all other reviewed systems are negative.  His Epworth Sleepiness Scale is only endorsed at  7 points- wife endorsed at 14 points.  Fatigue severity was endorsed at 45 points. Snoring, witnessed apneas, EDS-   Social History   Socioeconomic History  . Marital status: Married    Spouse name: Not on file  . Number of children: Not on file  . Years of education: Not on file  . Highest education level: Not on file  Social Needs  . Financial resource strain: Not on file  . Food insecurity - worry: Not on file  . Food insecurity - inability: Not on file  . Transportation needs - medical: Not on file  . Transportation needs - non-medical: Not on file  Occupational History  . Not on file  Tobacco Use  . Smoking status: Never Smoker  . Smokeless tobacco: Never  Used  Substance and Sexual Activity  . Alcohol use: No    Alcohol/week: 0.0 oz  . Drug use: No  . Sexual activity: Not on file  Other Topics Concern  . Not on file  Social History Narrative  . Not on file    Family History  Problem Relation Age of Onset  . Tuberculosis Mother   . Stroke Mother   . Heart disease Father        died at 70  . Bradycardia Brother        pacemaker  . Cancer Paternal Uncle   . Mental illness Paternal Grandfather        suicide at 68  . Colon cancer Neg Hx     Past Medical History:  Diagnosis Date  . Anxiety   . Diarrhea   . Hypertension     Past Surgical History:  Procedure Laterality Date  . BIOPSY  06/21/2017   Procedure: BIOPSY;  Surgeon: Rogene Houston, MD;  [facility];  Service: Endoscopy;;  rectal, righta and left colon;  . COLONOSCOPY N/A 06/21/2017   Procedure: COLONOSCOPY;  Surgeon: Rogene Houston, MD;  [facility];  Service: Endoscopy;  Laterality: N/A;  1:25  . KNEE SURGERY     tore mcl in high school (70's)    Current Outpatient Medications  Medication Sig Dispense Refill  . aspirin EC 81 MG tablet Take 1 tablet (81 mg total) by mouth daily.    . Cholecalciferol (VITAMIN D PO) Take 50 Units daily by mouth.     . diphenhydrAMINE (BENADRYL) 25 MG tablet Take 25 mg at bedtime as needed by mouth for sleep.    Marland Kitchen losartan (COZAAR) 100 MG tablet Take 1 tablet (100 mg total) by mouth daily. 90 tablet 3  . Mesalamine (ASACOL) 400 MG CPDR DR capsule Take 2 capsules (800 mg total) 2 (two) times daily by mouth. 120 capsule 11  . naproxen sodium (ALEVE) 220 MG tablet Take 220 mg by mouth daily as needed.    . vitamin C (ASCORBIC ACID) 500 MG tablet Take 500 mg by mouth daily.     No current facility-administered medications for this visit.     Allergies as of 11/30/2017  . (No Known Allergies)    Vitals: BP (!) 155/89   Pulse 77   Ht 6' 1"  (1.854 m)   Wt 193 lb (87.5 kg)   BMI 25.46 kg/m  Last  Weight:  Wt Readings from Last 1 Encounters:  11/30/17 193 lb (87.5 kg)   AUQ:JFHL mass index is 25.46 kg/m.     Last Height:   Ht Readings from Last 1 Encounters:  11/30/17 6' 1"  (1.854 m)  Physical exam:  General: Justin Justin [name] is awake, alert and appears not in acute distress. Justin Justin [name] is well groomed. Head: Normocephalic, atraumatic. Neck is supple. Mallampati 4,  neck circumference: 17.5. Nasal airflow congestion,  Retrognathia is seen.  Cardiovascular:  Regular rate and rhythm , without  murmurs or carotid bruit, and without distended neck veins. Respiratory: Lungs are clear to auscultation. Skin:  Without evidence of edema, or rash Trunk: BMI is 25, Justin Justin [name]'s posture is erect    Neurologic exam : Justin Justin [name] is awake and alert, oriented to place and time.   Attention span & concentration ability appears normal.  He has trouble recalling names.  Speech is fluent,  Without  dysarthria, dysphonia or aphasia.  Mood and affect are appropriate.  Cranial nerves: Pupils are equal and briskly reactive to light. Funduscopic exam without evidence of pallor or edema- retinal tear laser scar in Justin right eye. Floaters.  . Extraocular movements  in vertical and horizontal planes intact and but with a mild  Nystagmus- not just at endpoint. Visual fields by finger perimetry are intact. Hearing to finger rub intact. Facial sensation intact to fine touch. Facial motor strength is symmetric and tongue and uvula move midline. Shoulder shrug was symmetrical.   Motor exam:   Normal tone, muscle bulk and symmetric strength in all extremities. Left Knee crepitus, meniscus damage.  Sensory:  Fine touch, pinprick and vibration were tested in all extremities.  Coordination: Rapid alternating movements in Justin fingers/hands was normal. Finger-to-nose maneuver  normal without evidence of ataxia, dysmetria but a mild action tremor. Gait and station: Justin [name] walks without assistive device and is able  unassisted to climb up to Justin exam table. Strength within normal limits.  Stance is stable and normal.  Toe stand deferred  .Tandem gait is unfragmented. Turns with  3  Steps.  Deep tendon reflexes: in Justin  upper and lower extremities are symmetric and intact.    Assessment:  After physical and neurologic examination, review of laboratory studies,  Personal review of imaging studies, reports of other /same  Imaging studies, results of polysomnography and / or neurophysiology testing and pre-existing records as far as provided in visit., my assessment is   1) Justin Justin [name] daytime sleepiness would be considered excessive.  He is definitely sleep deprived given Justin average of 4-5 hours of nocturnal sleep, Justin Justin [name] morning awakenings and inability to go back to sleep.  He also prefers to sleep in Justin recliner which indicates that respiratory and air exchange problems are probably at Justin base of his insomnia.  He has been witnessed not just to snore family recently but also to stop breathing.  He has no other comorbidities that I can find there is an upper airway that is rather narrow but he is not obese, he does not have neuromuscular disorders, no cardiac or pulmonary disorder.  Plan : go to bed to earlier.  Attended sleep study , AHI SPLIT at 20, and 3 % score.  Nasal congestion- dust allergies. Will prescribe Nasocort.    Justin Justin [name] was advised of Justin nature of Justin diagnosed disorder , Justin treatment options and Justin  risks for general health and wellness arising from not treating Justin condition.   I spent more than 45 minutes of face to face time with Justin Justin [name].  Greater than 50% of time was spent in counseling and coordination of care. We have discussed Justin diagnosis and differential and I answered Justin Justin [name]'s questions.    Plan:  Treatment plan and additional workup :  Prescription for Nasacort or an equivalent generic, Justin Justin [name]'s nasal passage needs to be patent before he could be placed  on CPAP if necessary.  Attended sleep study at [facility] sleep AHI of 20, 3% scoring by [employer].  Sleep hygiene discussion I will give Justin Justin [name] Justin insomnia booklet.  Rv with MD   [name], MD 6/57/9038, 3:33 AM  Certified in Neurology by ABPN Certified in [place] by [facility] [address] [place], [place] [address]

## 2017-11-30 NOTE — Patient Instructions (Signed)
Please remember to try to maintain good sleep hygiene, which means: Keep a regular sleep and wake schedule, try not to exercise or have a meal within 2 hours of your bedtime, try to keep your bedroom conducive for sleep, that is, cool and dark, without light distractors such as an illuminated alarm clock, and refrain from watching TV right before sleep or in the middle of the night and do not keep the TV or radio on during the night. Also, try not to use or play on electronic devices at bedtime, such as your cell phone, tablet PC or laptop. If you like to read at bedtime on an electronic device, try to dim the background light as much as possible. Do not eat in the middle of the night.   We will request a sleep study.    We will look for leg twitching and snoring or sleep apnea.     Sleep Apnea Sleep apnea is a condition that affects breathing. People with sleep apnea have moments during sleep when their breathing pauses briefly or gets shallow. Sleep apnea can cause these symptoms:  Trouble staying asleep.  Sleepiness or tiredness during the day.  Irritability.  Loud snoring.  Morning headaches.  Trouble concentrating.  Forgetting things.  Less interest in sex.  Being sleepy for no reason.  Mood swings.  Personality changes.  Depression.  Waking up a lot during the night to pee (urinate).  Dry mouth.  Sore throat.  Follow these instructions at home:  Make any changes in your routine that your doctor recommends.  Eat a healthy, well-balanced diet.  Take over-the-counter and prescription medicines only as told by your doctor.  Avoid using alcohol, calming medicines (sedatives), and narcotic medicines.  Take steps to lose weight if you are overweight.  If you were given a machine (device) to use while you sleep, use it only as told by your doctor.  Do not use any tobacco products, such as cigarettes, chewing tobacco, and e-cigarettes. If you need help quitting,  ask your doctor.  Keep all follow-up visits as told by your doctor. This is important. Contact a doctor if:  The machine that you were given to use during sleep is uncomfortable or does not seem to be working.  Your symptoms do not get better.  Your symptoms get worse. Get help right away if:  Your chest hurts.  You have trouble breathing in enough air (shortness of breath).  You have an uncomfortable feeling in your back, arms, or stomach.  You have trouble talking.  One side of your body feels weak.  A part of your face is hanging down (drooping). These symptoms may be an emergency. Do not wait to see if the symptoms will go away. Get medical help right away. Call your local emergency services (911 in the [place] Do not drive yourself to the hospital. This information is not intended to replace advice given to you by your health care provider. Make sure you discuss any questions you have with your health care provider. Document Released: 08/02/2008 Document Revised: 06/19/2016 Document Reviewed: 08/03/2015 Elsevier Interactive Patient Education  [employer].

## 2017-12-12 ENCOUNTER — Ambulatory Visit (INDEPENDENT_AMBULATORY_CARE_PROVIDER_SITE_OTHER): Payer: BC Managed Care – PPO | Admitting: Neurology

## 2017-12-12 DIAGNOSIS — G4710 Hypersomnia, unspecified: Secondary | ICD-10-CM

## 2017-12-12 DIAGNOSIS — J3089 Other allergic rhinitis: Secondary | ICD-10-CM

## 2017-12-12 DIAGNOSIS — G4733 Obstructive sleep apnea (adult) (pediatric): Secondary | ICD-10-CM

## 2017-12-12 DIAGNOSIS — G4719 Other hypersomnia: Secondary | ICD-10-CM

## 2017-12-12 DIAGNOSIS — G4730 Sleep apnea, unspecified: Secondary | ICD-10-CM

## 2017-12-15 ENCOUNTER — Encounter: Payer: Self-pay | Admitting: Family Medicine

## 2017-12-15 ENCOUNTER — Other Ambulatory Visit: Payer: Self-pay | Admitting: Neurology

## 2017-12-15 DIAGNOSIS — G4730 Sleep apnea, unspecified: Secondary | ICD-10-CM

## 2017-12-15 DIAGNOSIS — G4733 Obstructive sleep apnea (adult) (pediatric): Secondary | ICD-10-CM

## 2017-12-15 DIAGNOSIS — G4710 Hypersomnia, unspecified: Secondary | ICD-10-CM | POA: Insufficient documentation

## 2017-12-15 DIAGNOSIS — G4734 Idiopathic sleep related nonobstructive alveolar hypoventilation: Secondary | ICD-10-CM

## 2017-12-15 DIAGNOSIS — R0683 Snoring: Secondary | ICD-10-CM

## 2017-12-15 NOTE — Procedures (Signed)
PATIENT'S NAME:  Justin [name], Justin [name] DOB:      01/18/1956      MRNo:               [id number]     DATE OF RECORDING: 12/12/2017 REFERRING M.D.:  Jenna Luo, M.D. Study Performed:  Split-Night Titration Study HISTORY:  [name] is a 62 year old patient referred for evaluation of excessive daytime sleepiness, witnessed apneas, and some confusion when aroused from sleep. [name] has no significant medical history, only diagnosis is a recent hernia. [name] has developed a habit of falling asleep in the den watching TV.  He may sleep there already 2 hours in a recliner before he will go to the bedroom at 11.30 PM.  He will turn to supine sleep soon after he is asleep- and snores loudly with apneas. He will have 2 or 3 bathroom breaks. The patient endorsed the Epworth Sleepiness Scale at 14/24 points, FSS at 45 points.   The patient's weight 193 pounds with a height of 73 (inches), resulting in a BMI of 25.7 kg/m2.The patient's neck circumference measured 17.5 inches.  CURRENT MEDICATIONS: Aspirin, Cholecalciferol, Diphenhydramine, Losartan, Mesalamine, Naproxen Sodium and Vitamin C.   PROCEDURE:  This is a multichannel digital polysomnogram utilizing the Somnostar 11.2 system.  Electrodes and sensors were applied and monitored per AASM Specifications.   EEG, EOG, Chin and Limb EMG, were sampled at 200 Hz.  ECG, Snore and Nasal Pressure, Thermal Airflow, Respiratory Effort, CPAP Flow and Pressure, Oximetry was sampled at 50 Hz. Digital video and audio were recorded.      BASELINE STUDY WITHOUT CPAP RESULTS: Lights Out was at 22:21 and Lights On at 05:13.  Total recording time (TRT) was 172.5, with a total sleep time (TST) of 159.5 minutes.   The patient's sleep latency was 5 minutes.  REM latency was 0 minutes. The sleep efficiency was 92.5 %.    SLEEP ARCHITECTURE: WASO (Wake after sleep onset) was 3 minutes, Stage N1 was 8.5 minutes, Stage N2 was 151 minutes, Stage N3 was 0 minutes and Stage R (REM  sleep) was 0 minutes.  The percentages were Stage N1 5.3%, Stage N2 94.7%, Stage N3 0%, and Stage R (REM sleep) 0%.   RESPIRATORY ANALYSIS:  There were a total of 112 respiratory events:  60 obstructive apneas, 0 central apneas and 0 mixed apneas with a total of 60 apneas and an apnea index (AI) of 22.6. There were 52 hypopneas with a hypopnea index of 19.6. The patient also had 0 respiratory event related arousals (RERAs).  Snoring was noted. The total APNEA/HYPOPNEA INDEX (AHI) was 42.1 /hour and the total RESPIRATORY DISTURBANCE INDEX was 42.1 /hour.  0 events occurred in REM sleep and 104 events in NREM. The patient spent 324.5 minutes sleep time in the supine position and 60 minutes in non-supine. The supine AHI was 42.2 /hr. versus a non-supine AHI of 0.0 /hour.  OXYGEN SATURATION & C02:  The wake baseline 02 saturation was 92%, with the lowest being 72%. Time spent below 89% saturation equaled 58 minutes.  PERIODIC LIMB MOVEMENTS:   The patient had a total of 36 Periodic Limb Movements.  The Periodic Limb Movement (PLM) index was 13.5 /hour and the PLM Arousal index was 0.8 /hour. The arousals were noted as: 4 were spontaneous, 2 were associated with PLMs, and 112 were associated with respiratory events. Audio and video analysis did not show any abnormal or unusual movements, behaviors, phonations or vocalizations The  patient took one bathroom break. Snoring was noted, worse in supine.  EKG was in keeping with normal sinus rhythm (NSR)  TITRATION STUDY WITH CPAP RESULTS: CPAP was initiated at 5 cmH20 with heated humidity per AASM split night standards and pressure was advanced to 9/9 cmH20 because of hypopneas, apneas and desaturations.  At a PAP pressure of 9 cmH20, there was a reduction of the AHI to 0.0 /hour, sleep efficiency became 100% of 48 minutes, and SpO2 nadir was at 94%.  Total recording time (TRT) was 240.5 minutes, with a total sleep time (TST) of 224.5 minutes. The patient's sleep  latency was 1 minutes. REM latency was 74 minutes.  The sleep efficiency was 93.3 %.    SLEEP ARCHITECTURE: Wake after sleep was 15 minutes, Stage N1 8 minutes, Stage N2 180 minutes, Stage N3 0 minutes and Stage R (REM sleep) 36.5 minutes. The percentages were: Stage N1 3.6%, Stage N2 80.2%, Stage N3 0% and Stage R (REM sleep) 16.3%. The sleep architecture was notable for REM sleep rebound. RESPIRATORY ANALYSIS:  There were 0 apneas and 18 hypopneas with 0 respiratory event related arousals (RERAs).  The total APNEA/HYPOPNEA INDEX (AHI) was 4.8 /hour and the total RESPIRATORY DISTURBANCE INDEX was 4.8 /hour.  0 events occurred in REM sleep and 18 events in NREM. The REM AHI was 0 /hour versus a non-REM AHI of 5.7 /hour. The patient spent 73% of total sleep time in the supine position. The supine AHI was 6.5 /hour, versus a non-supine AHI of 0.0/hour.  OXYGEN SATURATION & C02:  The wake baseline 02 saturation was 96%, with the lowest being 90%. Time spent below 89% saturation equaled 0 minutes.  PERIODIC LIMB MOVEMENTS:   The patient had a total of 0 Periodic Limb Movements. The arousals were noted as: 15 were spontaneous, 0 were associated with PLMs, and 18 were associated with respiratory events. Post-study, the patient indicated that sleep was more refreshing than usual.    POLYSOMNOGRAPHY IMPRESSION :   1. Severe Obstructive Sleep Apnea (OSA) at AHI of 42.1/hr. Supine dependent Apnea.  2. Sleep hypoxemia with SpO2 nadir at 72% and 58 minutes total time in desaturation.  3. Supine dependent Snoring. 4. All listed findings were alleviated under CPAP at 9 cm H20 pressure, with the use of heated humidification and a ResMed P 10 AirFit nasal pillow in medium size.    RECOMMENDATIONS:  1. Advise to start auto titration capable CPAP at 9 cmH2O and follow clinical symptomatology.   2.     3. Compliance to PAP therapy should be emphasized as 4 hours or more of nightly use. Compliance, AHI and air  leak information to be downloaded for objective assessment at 60-90 days, and annually thereafter.   4. Positional therapy is advised- avoid supine sleep position if possible. 5. Avoid alcohol and sedative-hypnotics which worsen sleep apnea (as applicable). 6. Further information regarding OSA may be obtained from [employer] (www.sleepfoundation.org) or American Sleep Apnea Association (www.sleepapnea.org). 7. A follow up appointment will be scheduled in the Sleep Clinic at [facility].      I certify that I have reviewed the entire raw data recording prior to the issuance of this report in accordance with the Standards of Accreditation of the American Academy of Sleep Medicine (AASM)      [name], M.D.   12-15-2017  Diplomat of the American Board of Psychiatry and Neurology and the [place] of Sleep Medicine. Medical Director of [facility]  at [facility]

## 2017-12-18 ENCOUNTER — Telehealth: Payer: Self-pay | Admitting: Neurology

## 2017-12-18 NOTE — Telephone Encounter (Signed)
-----   Message from [name], MD sent at 12/15/2017  3:58 PM EST ----- 1. Severe Obstructive Sleep Apnea (OSA) at AHI of 42.1/hr. Supine  dependent Apnea.  2. Sleep hypoxemia with SpO2 nadir at 72% and 58 minutes total  time in desaturation.  3. Supine dependent Snoring. 4. All listed findings were alleviated under CPAP at 9 cm H20  pressure, with the use of heated humidification and a ResMed P 10  AirFit nasal pillow in medium size.

## 2017-12-18 NOTE — Telephone Encounter (Signed)
Called patient to discuss sleep study results. No answer at this time. LVM for the patient to call back.   

## 2017-12-18 NOTE — Telephone Encounter (Signed)
Pt returned call. I advised pt that Dr. Brett Fairy reviewed their sleep study results and found that pt has sleep apnea. Dr. Brett Fairy recommends that pt starts CPAP. I reviewed PAP compliance expectations with the pt. Pt is agreeable to starting a CPAP. I advised pt that an order will be sent to a DME, [facility], and [facility] will call the pt within about one week after they file with the pt's insurance. [facility] will show the pt how to use the machine, fit for masks, and troubleshoot the CPAP if needed. A follow up appt was made for insurance purposes with Cecille Rubin, NP on Mar 13, 2018 at 8:15 am. Pt verbalized understanding to arrive 15 minutes early and bring their CPAP. A letter with all of this information in it will be mailed to the pt as a reminder. I verified with the pt that the address we have on file is correct. Pt verbalized understanding of results. Pt had no questions at this time but was encouraged to call back if questions arise.

## 2018-03-11 ENCOUNTER — Encounter: Payer: Self-pay | Admitting: Nurse Practitioner

## 2018-03-12 NOTE — Progress Notes (Signed)
[facility]  PATIENT: Justin [name] DOB: 19-Feb-1956   REASON FOR VISIT: Follow-up for obstructive sleep apnea newly diagnosed with initial CPAP HISTORY FROM: Patient    HISTORY OF PRESENT ILLNESS:UPDATE 5/7/2019CM Justin [name], 62 year old male returns for follow-up with newly diagnosed obstructive sleep apnea here for initial CPAP.  He has been using his machine for approximately 2 months.  He claims he is still getting used to it.  He recently changed his mask.  He sometimes wakes up with his mask off.  Compliance data dated 02/10/2018-03/11/2018 shows compliance greater than 4 hours at 70%.  Average usage 4 hours 53 minutes.  Pressure set at 9 cm EPR level 3 AHI 5.  Minimal leak ESS 5.  He returns for reevaluation  1/24/19CDKeith MESSI [name] is a 62 y.o. male , seen here as in a referral from Dr. Dennard Schaumann for a sleep consult.   I have the pleasure of meeting [name] today, Justin [name] is referred for evaluation of excessive daytime sleepiness, witnessed apneas, and some confusion when aroused from sleep. Justin [name] has no significant medical history, his only diagnosis is a recent hernia and at one time he was evaluated by cardiology with a cardiac echo that returned normal.  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 in the day may be followed by several hours in the office. Justin [name] has developed over the last years a habit of falling asleep in the den watching TV.  He may sleep there already 2 hours in a recliner before he will go over to the bedroom.  The couple at bedtime is now around 1130.  The bedroom is cool quiet and dark, the patient sleeps on a flat mattress, he sleeps on one pillow for head support one pillow between his knees and rest on his side. He will turned to supine sleep soon after he  is asleep.  When he snores loudest, and that is when apneas have been witnessed. He will have always 1 sometimes 2 or 3 bathroom breaks depending on fluid intake, his first bathroom break is usually around 3:00 AM. He does not recall vivid or lucid dreams and there is no dream enactment.  He wakes up spontaneously not depending on work requirements, he seems to wake up spontaneously between 3 and 5 AM and often has difficulties to go back to sleep.  In that case he usually goes back to his recliner.  He estimates that he gets only 4-5 hours of nocturnal sleep and some nights less. He has not fallen asleep at job sites, but once he is no longer physically active or mentally stimulated he struggles to stay awake. On a week end he may take a nap- 20 minute power    REVIEW OF SYSTEMS: Full 14 system review of systems performed and notable only for those listed, all others are neg:  Constitutional: neg  Cardiovascular: neg Ear/Nose/Throat: neg  Skin: neg Eyes: neg Respiratory: neg Gastroitestinal: neg  Hematology/Lymphatic: neg  Endocrine: neg Musculoskeletal: Joint pain left knee Allergy/Immunology: neg Neurological: neg Psychiatric: neg Sleep : As apnea with CPAP   ALLERGIES: No Known Allergies  HOME MEDICATIONS: Outpatient Medications Prior to Visit  Medication Sig Dispense Refill  . aspirin EC 81 MG tablet Take 1 tablet (81 mg total) by mouth daily.    Marland Kitchen  Cholecalciferol (VITAMIN D PO) Take 50 Units daily by mouth.     . diphenhydrAMINE (BENADRYL) 25 MG tablet Take 25 mg at bedtime as needed by mouth for sleep.    Marland Kitchen losartan (COZAAR) 100 MG tablet Take 1 tablet (100 mg total) by mouth daily. 90 tablet 3  . Mesalamine (DELZICOL) 400 MG CPDR DR capsule Take 800 mg by mouth 2 (two) times daily.    . naproxen sodium (ALEVE) 220 MG tablet Take 220 mg by mouth daily as needed.    . triamcinolone (NASACORT) 55 MCG/ACT AERO nasal inhaler Place 2 sprays into the nose daily. 1 Inhaler 12  .  vitamin C (ASCORBIC ACID) 500 MG tablet Take 500 mg by mouth daily.    . Mesalamine (ASACOL) 400 MG CPDR DR capsule Take 2 capsules (800 mg total) 2 (two) times daily by mouth. 120 capsule 11   No facility-administered medications prior to visit.     PAST MEDICAL HISTORY: Past Medical History:  Diagnosis Date  . Anxiety   . Diarrhea   . Hypertension   . OSA (obstructive sleep apnea)    cpap 9 cm H2O    PAST SURGICAL HISTORY: Past Surgical History:  Procedure Laterality Date  . BIOPSY  06/21/2017   Procedure: BIOPSY;  Surgeon: Rogene Houston, MD;  [facility];  Service: Endoscopy;;  rectal, righta and left colon;  . COLONOSCOPY N/A 06/21/2017   Procedure: COLONOSCOPY;  Surgeon: Rogene Houston, MD;  Location: [facility];  Service: Endoscopy;  Laterality: N/A;  1:25  . KNEE SURGERY     tore mcl in high school (70's)    FAMILY HISTORY: Family History  Problem Relation Age of Onset  . Tuberculosis Mother   . Stroke Mother   . Heart disease Father        died at 20  . Bradycardia Brother        pacemaker  . Cancer Paternal Uncle   . Mental illness Paternal Grandfather        suicide at 71  . Colon cancer Neg Hx     SOCIAL HISTORY: Social History   Socioeconomic History  . Marital status: Married    Spouse name: Not on file  . Number of children: Not on file  . Years of education: Not on file  . Highest education level: Not on file  Occupational History  . Not on file  Social Needs  . Financial resource strain: Not on file  . Food insecurity:    Worry: Not on file    Inability: Not on file  . Transportation needs:    Medical: Not on file    Non-medical: Not on file  Tobacco Use  . Smoking status: Never Smoker  . Smokeless tobacco: Never Used  Substance and Sexual Activity  . Alcohol use: No    Alcohol/week: 0.0 oz  . Drug use: No  . Sexual activity: Not on file  Lifestyle  . Physical activity:    Days per week: Not on file    Minutes  per session: Not on file  . Stress: Not on file  Relationships  . Social connections:    Talks on phone: Not on file    Gets together: Not on file    Attends religious service: Not on file    Active member of club or organization: Not on file    Attends meetings of clubs or organizations: Not on file    Relationship status: Not on  file  . Intimate partner violence:    Fear of current or ex partner: Not on file    Emotionally abused: Not on file    Physically abused: Not on file    Forced sexual activity: Not on file  Other Topics Concern  . Not on file  Social History Narrative  . Not on file     PHYSICAL EXAM  Vitals:   03/13/18 0824  BP: (!) 150/89  Pulse: 70  Weight: 199 lb 3.2 oz (90.4 kg)  Height: 6' 1"  (1.854 m)   Body mass index is 26.28 kg/m.  Generalized: Well developed, in no acute distress  Head: normocephalic and atraumatic,. Oropharynx benign mallopati 4 Neck: Supple, circumference 17.5 Musculoskeletal: No deformity   Neurological examination   Mentation: Alert oriented to time, place, history taking. Attention span and concentration appropriate. Recent and remote memory intact.  Follows all commands speech and language fluent.   Cranial nerve II-XII: .Pupils were equal round reactive to light extraocular movements were full, visual field were full on confrontational test. Facial sensation and strength were normal. hearing was intact to finger rubbing bilaterally. Uvula tongue midline. head turning and shoulder shrug were normal and symmetric.Tongue protrusion into cheek strength was normal. Motor: normal bulk and tone, full strength in the BUE, BLE, Sensory: normal and symmetric to light touch,  Coordination: finger-nose-finger, heel-to-shin bilaterally, no dysmetria Gait and Station: Rising up from seated position without assistance, normal stance,  moderate stride, good arm swing, smooth turning, able to perform tiptoe, and heel walking without  difficulty. Tandem gait is steady  DIAGNOSTIC DATA (LABS, IMAGING, TESTING) - I reviewed patient records, labs, notes, testing and imaging myself where available.  Lab Results  Component Value Date   WBC 7.2 04/28/2017   HGB 15.3 04/28/2017   HCT 45.9 04/28/2017   MCV 95.8 04/28/2017   PLT 352 04/28/2017      Component Value Date/Time   NA 139 04/28/2017 0915   K 4.5 04/28/2017 0915   CL 104 04/28/2017 0915   CO2 22 04/28/2017 0915   GLUCOSE 93 04/28/2017 0915   BUN 20 04/28/2017 0915   CREATININE 1.08 04/28/2017 0915   CALCIUM 9.6 04/28/2017 0915   PROT 6.8 04/28/2017 0915   ALBUMIN 4.2 04/28/2017 0915   AST 16 04/28/2017 0915   ALT 10 04/28/2017 0915   ALKPHOS 72 04/28/2017 0915   BILITOT 0.9 04/28/2017 0915   GFRNONAA 74 04/28/2017 0915   GFRAA 85 04/28/2017 0915   Lab Results  Component Value Date   CHOL 162 04/28/2017   HDL 47 04/28/2017   LDLCALC 105 (H) 04/28/2017   TRIG 50 04/28/2017   CHOLHDL 3.4 04/28/2017   Lab Results  Component Value Date   HGBA1C 5.8 (H) 05/19/2015   No results found for: VITAMINB12 Lab Results  Component Value Date   TSH 1.15 04/28/2017      ASSESSMENT AND PLAN  62 y.o. year old male  has a past medical history of Anxiety, Diarrhea, Hypertension, and OSA (obstructive sleep apnea). here for initial CPAP compliance. Compliance data dated 02/10/2018-03/11/2018 shows compliance greater than 4 hours at 70%.  Average usage 4 hours 53 minutes.  Pressure set at 9 cm EPR level 3 AHI 5.  Minimal leak ESS 5.  PLAN: CPAP compliance 70%data reviewed with patient Continue same settings Follow-up in 6 months Dennie Bible, [facility], [facility], [facility] [address], [place], [place] [address] [phone]

## 2018-03-13 ENCOUNTER — Ambulatory Visit: Payer: BC Managed Care – PPO | Admitting: Nurse Practitioner

## 2018-03-13 ENCOUNTER — Encounter: Payer: Self-pay | Admitting: Nurse Practitioner

## 2018-03-13 DIAGNOSIS — Z9989 Dependence on other enabling machines and devices: Secondary | ICD-10-CM

## 2018-03-13 DIAGNOSIS — G4733 Obstructive sleep apnea (adult) (pediatric): Secondary | ICD-10-CM | POA: Diagnosis not present

## 2018-03-13 NOTE — Patient Instructions (Signed)
CPAP compliance 70% Continue same settings Follow-up in 6 months

## 2018-03-15 NOTE — Progress Notes (Signed)
I agree with the assessment and plan as directed by NP .The patient is known to me .   Shawndell Varas, MD  

## 2018-03-21 NOTE — H&P (Signed)
Justin [name]; [id number]; 15-Apr-1956   HPI Patient is a 62 year old white male who was referred to my care by Dr. Dennard Schaumann for evaluation and treatment of a right inguinal hernia.  Patient states the hernia has been present for over 10 years.  It has always been large in size.  It is so large that sometimes gets in his way with movement, but he has had no nausea or vomiting.  He currently has 0 out of 10 abdominal pain.  He also is being evaluated for left knee pain which has been chronic in nature.  He is seeing an orthopedic surgeon later today. Past Medical History:  Diagnosis Date  . Anxiety   . Diarrhea   . Hypertension     Past Surgical History:  Procedure Laterality Date  . BIOPSY  06/21/2017   Procedure: BIOPSY;  Surgeon: Rogene Houston, MD;  [facility];  Service: Endoscopy;;  rectal, righta and left colon;  . COLONOSCOPY N/A 06/21/2017   Procedure: COLONOSCOPY;  Surgeon: Rogene Houston, MD;  [facility]: [facility];  Service: Endoscopy;  Laterality: N/A;  1:25  . KNEE SURGERY     tore mcl in high school (70's)    Family History  Problem Relation Age of Onset  . Tuberculosis Mother   . Stroke Mother   . Heart disease Father        died at 78  . Bradycardia Brother        pacemaker  . Cancer Paternal Uncle   . Mental illness Paternal Grandfather        suicide at 44  . Colon cancer Neg Hx     Current Outpatient Medications on File Prior to Visit  Medication Sig Dispense Refill  . aspirin EC 81 MG tablet Take 1 tablet (81 mg total) by mouth daily.    . Cholecalciferol (VITAMIN D PO) Take 50 Units daily by mouth.     . diphenhydrAMINE (BENADRYL) 25 MG tablet Take 25 mg at bedtime as needed by mouth for sleep.    Marland Kitchen losartan (COZAAR) 100 MG tablet Take 1 tablet (100 mg total) by mouth daily. 90 tablet 3  . Mesalamine (ASACOL) 400 MG CPDR DR capsule Take 2 capsules (800 mg total) 2 (two) times daily by mouth. 120 capsule 11  . naproxen sodium (ALEVE) 220 MG  tablet Take 220 mg by mouth daily as needed.    . vitamin C (ASCORBIC ACID) 500 MG tablet Take 500 mg by mouth daily.     No current facility-administered medications on file prior to visit.     No Known Allergies  Social History   Substance and Sexual Activity  Alcohol Use No  . Alcohol/week: 0.0 oz    Social History   Tobacco Use  Smoking Status Never Smoker  Smokeless Tobacco Never Used    Review of Systems  Constitutional: Negative.   HENT: Positive for sinus pain.   Eyes: Negative.   Respiratory: Negative.   Cardiovascular: Negative.   Gastrointestinal: Negative.   Genitourinary: Negative.   Musculoskeletal: Positive for joint pain.  Skin: Negative.   Neurological: Negative.   Endo/Heme/Allergies: Negative.   Psychiatric/Behavioral: Negative.     Objective   Vitals:   11/16/17 1119  BP: (!) 168/111  Pulse: 75  Temp: 99.3 F (37.4 C)    Physical Exam  Constitutional: He is oriented to person, place, and time and well-developed, well-nourished, and in no distress.  HENT:  Head: Normocephalic and atraumatic.  Cardiovascular: Normal rate, regular rhythm and normal heart sounds. Exam reveals no gallop and no friction rub.  No murmur heard. Pulmonary/Chest: Effort normal and breath sounds normal. No respiratory distress. He has no wheezes. He has no rales.  Abdominal: Soft. Bowel sounds are normal. He exhibits no distension. There is no tenderness. There is no rebound.  Very large right inguinal hernia with bowel extending into the scrotal sac.  Would be easily reducible, although it would come right back out given the size of the hernia defect.  No obvious hydrocele present.  Neurological: He is alert and oriented to person, place, and time.  Skin: Skin is warm and dry.  Vitals reviewed.   Assessment  Large right inguinal hernia containing bowel, symptomatic Plan   Patient is scheduled for a right inguinal herniorrhaphy with mesh.  The risks and  benefits of the procedure including bleeding, infection, mesh use, and the possibility of recurrence of the hernia were fully explained to the patient, who gave informed consent.

## 2018-03-26 NOTE — Patient Instructions (Signed)
Justin [name]  03/26/2018     @PREFPERIOPPHARMACY @   Your procedure is scheduled on 04/04/2018.  Report to [facility] at 6:15 A.M.  Call this number if you have problems the morning of surgery:  [phone]   Remember: Nothing to eat or drink after midnight   Take these medicines the morning of surgery with A SIP OF WATER Losartan, Mesalamine, Nasocort    Do not wear jewelry, make-up or nail polish.  Do not wear lotions, powders, or perfumes, or deodorant.  Do not shave 48 hours prior to surgery.  Men may shave face and neck.  Do not bring valuables to the hospital.  [facility] is not responsible for any belongings or valuables.  Contacts, dentures or bridgework may not be worn into surgery.  Leave your suitcase in the car.  After surgery it may be brought to your room.  For patients admitted to the hospital, discharge time will be determined by your treatment team.  Patients discharged the day of surgery will not be allowed to drive home.    Please read over the following fact sheets that you were given. Surgical Site Infection Prevention and Anesthesia Post-op Instructions     PATIENT INSTRUCTIONS POST-ANESTHESIA  IMMEDIATELY FOLLOWING SURGERY:  Do not drive or operate machinery for the first twenty four hours after surgery.  Do not make any important decisions for twenty four hours after surgery or while taking narcotic pain medications or sedatives.  If you develop intractable nausea and vomiting or a severe headache please notify your doctor immediately.  FOLLOW-UP:  Please make an appointment with your surgeon as instructed. You do not need to follow up with anesthesia unless specifically instructed to do so.  WOUND CARE INSTRUCTIONS (if applicable):  Keep a dry clean dressing on the anesthesia/puncture wound site if there is drainage.  Once the wound has quit draining you may leave it open to air.  Generally you should leave the bandage intact for twenty four hours  unless there is drainage.  If the epidural site drains for more than 36-48 hours please call the anesthesia department.  QUESTIONS?:  Please feel free to call your physician or the hospital operator if you have any questions, and they will be happy to assist you.      Inguinal Hernia, Adult An inguinal hernia is when fat or the intestines push through the area where the leg meets the lower abdomen (groin) and create a rounded lump (bulge). This condition develops over time. There are three types of inguinal hernias. These types include:  Hernias that can be pushed back into the belly (are reducible).  Hernias that are not reducible (are incarcerated).  Hernias that are not reducible and lose their blood supply (are strangulated). This type of hernia requires emergency surgery.  What are the causes? This condition is caused by having a weak spot in the muscles or tissue. This weakness lets the hernia poke through. This condition can be triggered by:  Suddenly straining the muscles of the lower abdomen.  Lifting heavy objects.  Straining to have a bowel movement. Difficult bowel movements (constipation) can lead to this.  Coughing.  What increases the risk? This condition is more likely to develop in:  Men.  Pregnant women.  People who: ? Are overweight. ? Work in jobs that require long periods of standing or heavy lifting. ? Have had an inguinal hernia before. ? Smoke or have lung disease. These factors can lead to long-lasting (chronic)  coughing.  What are the signs or symptoms? Symptoms can depend on the size of the hernia. Often, a small inguinal hernia has no symptoms. Symptoms of a larger hernia include:  A lump in the groin. This is easier to see when the person is standing. It might not be visible when he or she is lying down.  Pain or burning in the groin. This occurs especially when lifting, straining, or coughing.  A dull ache or a feeling of pressure in the  groin.  A lump in the scrotum in men.  Symptoms of a strangulated inguinal hernia can include:  A bulge in the groin that is very painful and tender to the touch.  A bulge that turns red or purple.  Fever, nausea, and vomiting.  The inability to have a bowel movement or to pass gas.  How is this diagnosed? This condition is diagnosed with a medical history and physical exam. Your health care provider may feel your groin area and ask you to cough. How is this treated? Treatment for this condition varies depending on the size of your hernia and whether you have symptoms. If you do not have symptoms, your health care provider may have you watch your hernia carefully and come in for follow-up visits. If your hernia is larger or if you have symptoms, your treatment will include surgery. Follow these instructions at home: Lifestyle  Drink enough fluid to keep your urine clear or pale yellow.  Eat a diet that includes a lot of fiber. Eat plenty of fruits, vegetables, and whole grains. Talk with your health care provider if you have questions.  Avoid lifting heavy objects.  Avoid standing for long periods of time.  Do not use tobacco products, including cigarettes, chewing tobacco, or e-cigarettes. If you need help quitting, ask your health care provider.  Maintain a healthy weight. General instructions  Do not try to force the hernia back in.  Watch your hernia for any changes in color or size. Let your health care provider know if any changes occur.  Take over-the-counter and prescription medicines only as told by your health care provider.  Keep all follow-up visits as told by your health care provider. This is important. Contact a health care provider if:  You have a fever.  You have new symptoms.  Your symptoms get worse. Get help right away if:  You have pain in the groin that suddenly gets worse.  A bulge in the groin gets bigger suddenly and does not go  down.  You are a man and you have a sudden pain in the scrotum, or the size of your scrotum suddenly changes.  A bulge in the groin area becomes red or purple and is painful to the touch.  You have nausea or vomiting that does not go away.  You feel your heart beating a lot more quickly than normal.  You cannot have a bowel movement or pass gas. This information is not intended to replace advice given to you by your health care provider. Make sure you discuss any questions you have with your health care provider. Document Released: 03/12/2009 Document Revised: 03/31/2016 Document Reviewed: 09/03/2014 Elsevier Interactive Patient Education  2018 [employer].

## 2018-03-27 ENCOUNTER — Encounter (HOSPITAL_COMMUNITY)
Admission: RE | Admit: 2018-03-27 | Discharge: 2018-03-27 | Disposition: A | Discharge status: Home / Self Care | Payer: BC Managed Care – PPO | Source: Ambulatory Visit | Attending: General Surgery | Admitting: General Surgery

## 2018-03-27 DIAGNOSIS — Z8249 Family history of ischemic heart disease and other diseases of the circulatory system: Secondary | ICD-10-CM | POA: Insufficient documentation

## 2018-03-27 DIAGNOSIS — Z79899 Other long term (current) drug therapy: Secondary | ICD-10-CM | POA: Diagnosis not present

## 2018-03-27 DIAGNOSIS — Z7982 Long term (current) use of aspirin: Secondary | ICD-10-CM | POA: Diagnosis not present

## 2018-03-27 DIAGNOSIS — K4090 Unilateral inguinal hernia, without obstruction or gangrene, not specified as recurrent: Secondary | ICD-10-CM | POA: Diagnosis not present

## 2018-03-27 DIAGNOSIS — R9431 Abnormal electrocardiogram [ECG] [EKG]: Secondary | ICD-10-CM | POA: Insufficient documentation

## 2018-03-27 DIAGNOSIS — Z01812 Encounter for preprocedural laboratory examination: Secondary | ICD-10-CM | POA: Insufficient documentation

## 2018-03-27 DIAGNOSIS — I10 Essential (primary) hypertension: Secondary | ICD-10-CM | POA: Diagnosis not present

## 2018-03-27 DIAGNOSIS — Z823 Family history of stroke: Secondary | ICD-10-CM | POA: Insufficient documentation

## 2018-03-27 LAB — BASIC METABOLIC PANEL
ANION GAP: 7 (ref 5–15)
BUN: 20 mg/dL (ref 6–20)
CALCIUM: 9.5 mg/dL (ref 8.9–10.3)
CO2: 30 mmol/L (ref 22–32)
CREATININE: 1 mg/dL (ref 0.61–1.24)
Chloride: 102 mmol/L (ref 101–111)
Glucose, Bld: 128 mg/dL — ABNORMAL HIGH (ref 65–99)
Potassium: 4.1 mmol/L (ref 3.5–5.1)
Sodium: 139 mmol/L (ref 135–145)

## 2018-03-27 LAB — CBC WITH DIFFERENTIAL/PLATELET
BASOS ABS: 0 10*3/uL (ref 0.0–0.1)
BASOS PCT: 0 %
EOS ABS: 0.2 10*3/uL (ref 0.0–0.7)
Eosinophils Relative: 3 %
HEMATOCRIT: 45.2 % (ref 39.0–52.0)
Hemoglobin: 15.3 g/dL (ref 13.0–17.0)
Lymphocytes Relative: 25 %
Lymphs Abs: 1.5 10*3/uL (ref 0.7–4.0)
MCH: 32.1 pg (ref 26.0–34.0)
MCHC: 33.8 g/dL (ref 30.0–36.0)
MCV: 94.8 fL (ref 78.0–100.0)
MONOS PCT: 10 %
Monocytes Absolute: 0.6 10*3/uL (ref 0.1–1.0)
NEUTROS ABS: 3.8 10*3/uL (ref 1.7–7.7)
Neutrophils Relative %: 62 %
Platelets: 267 10*3/uL (ref 150–400)
RBC: 4.77 MIL/uL (ref 4.22–5.81)
RDW: 12.5 % (ref 11.5–15.5)
WBC: 6.1 10*3/uL (ref 4.0–10.5)

## 2018-04-04 ENCOUNTER — Encounter (HOSPITAL_COMMUNITY)
Admission: RE | Disposition: A | Discharge status: Home / Self Care | Payer: Self-pay | Source: Ambulatory Visit | Attending: General Surgery

## 2018-04-04 ENCOUNTER — Ambulatory Visit (HOSPITAL_COMMUNITY): Payer: BC Managed Care – PPO | Admitting: Anesthesiology

## 2018-04-04 ENCOUNTER — Encounter (HOSPITAL_COMMUNITY): Payer: Self-pay | Admitting: *Deleted

## 2018-04-04 ENCOUNTER — Ambulatory Visit (HOSPITAL_COMMUNITY)
Admission: RE | Admit: 2018-04-04 | Discharge: 2018-04-04 | Disposition: A | Discharge status: Home / Self Care | Payer: BC Managed Care – PPO | Source: Ambulatory Visit | Attending: General Surgery | Admitting: General Surgery

## 2018-04-04 DIAGNOSIS — G4730 Sleep apnea, unspecified: Secondary | ICD-10-CM | POA: Diagnosis not present

## 2018-04-04 DIAGNOSIS — Z7982 Long term (current) use of aspirin: Secondary | ICD-10-CM | POA: Diagnosis not present

## 2018-04-04 DIAGNOSIS — I10 Essential (primary) hypertension: Secondary | ICD-10-CM | POA: Insufficient documentation

## 2018-04-04 DIAGNOSIS — Z9989 Dependence on other enabling machines and devices: Secondary | ICD-10-CM | POA: Insufficient documentation

## 2018-04-04 DIAGNOSIS — Z79899 Other long term (current) drug therapy: Secondary | ICD-10-CM | POA: Insufficient documentation

## 2018-04-04 DIAGNOSIS — K4031 Unilateral inguinal hernia, with obstruction, without gangrene, recurrent: Secondary | ICD-10-CM

## 2018-04-04 DIAGNOSIS — K4090 Unilateral inguinal hernia, without obstruction or gangrene, not specified as recurrent: Secondary | ICD-10-CM

## 2018-04-04 HISTORY — PX: INGUINAL HERNIA REPAIR: SHX194

## 2018-04-04 SURGERY — REPAIR, HERNIA, INGUINAL, ADULT
Anesthesia: General | Laterality: Right

## 2018-04-04 MED ORDER — FENTANYL CITRATE (PF) 100 MCG/2ML IJ SOLN
INTRAMUSCULAR | Status: DC | PRN
  Administered 2018-04-04 (×2): 50 ug via INTRAVENOUS

## 2018-04-04 MED ORDER — MIDAZOLAM HCL 2 MG/2ML IJ SOLN
INTRAMUSCULAR | Status: AC
  Filled 2018-04-04: qty 2

## 2018-04-04 MED ORDER — EPHEDRINE SULFATE 50 MG/ML IJ SOLN
INTRAMUSCULAR | Status: DC | PRN
  Administered 2018-04-04 (×3): 5 mg via INTRAVENOUS

## 2018-04-04 MED ORDER — LIDOCAINE HCL (PF) 1 % IJ SOLN
INTRAMUSCULAR | Status: AC
  Filled 2018-04-04: qty 5

## 2018-04-04 MED ORDER — PHENYLEPHRINE 40 MCG/ML (10ML) SYRINGE FOR IV PUSH (FOR BLOOD PRESSURE SUPPORT)
PREFILLED_SYRINGE | INTRAVENOUS | Status: AC
  Filled 2018-04-04: qty 10

## 2018-04-04 MED ORDER — HYDROCODONE-ACETAMINOPHEN 5-325 MG PO TABS: 1.0000 | ORAL_TABLET | ORAL | 0 refills | Status: DC | PRN

## 2018-04-04 MED ORDER — FENTANYL CITRATE (PF) 100 MCG/2ML IJ SOLN: 25.0000 ug | INTRAMUSCULAR | Status: DC | PRN

## 2018-04-04 MED ORDER — PROPOFOL 10 MG/ML IV BOLUS
INTRAVENOUS | Status: AC
  Filled 2018-04-04: qty 20

## 2018-04-04 MED ORDER — GLYCOPYRROLATE 0.2 MG/ML IJ SOLN
INTRAMUSCULAR | Status: DC | PRN
  Administered 2018-04-04 (×2): 0.1 mg via INTRAVENOUS

## 2018-04-04 MED ORDER — ONDANSETRON HCL 4 MG/2ML IJ SOLN
INTRAMUSCULAR | Status: AC
  Filled 2018-04-04: qty 2

## 2018-04-04 MED ORDER — PHENYLEPHRINE HCL 10 MG/ML IJ SOLN
INTRAMUSCULAR | Status: DC | PRN
  Administered 2018-04-04: 80 ug via INTRAVENOUS
  Administered 2018-04-04 (×2): 40 ug via INTRAVENOUS
  Administered 2018-04-04 (×2): 80 ug via INTRAVENOUS
  Administered 2018-04-04 (×2): 40 ug via INTRAVENOUS
  Administered 2018-04-04 (×4): 80 ug via INTRAVENOUS
  Administered 2018-04-04: 40 ug via INTRAVENOUS

## 2018-04-04 MED ORDER — CEFAZOLIN SODIUM-DEXTROSE 2-4 GM/100ML-% IV SOLN
INTRAVENOUS | Status: AC
  Filled 2018-04-04: qty 100

## 2018-04-04 MED ORDER — CHLORHEXIDINE GLUCONATE CLOTH 2 % EX PADS: 6.0000 | MEDICATED_PAD | Freq: Once | CUTANEOUS | Status: DC

## 2018-04-04 MED ORDER — BUPIVACAINE LIPOSOME 1.3 % IJ SUSP
INTRAMUSCULAR | Status: AC
  Filled 2018-04-04: qty 20

## 2018-04-04 MED ORDER — HYDROCODONE-ACETAMINOPHEN 7.5-325 MG PO TABS: 1.0000 | ORAL_TABLET | Freq: Once | ORAL | Status: DC | PRN

## 2018-04-04 MED ORDER — LACTATED RINGERS IV SOLN: INTRAVENOUS | Status: DC

## 2018-04-04 MED ORDER — ONDANSETRON HCL 4 MG/2ML IJ SOLN
INTRAMUSCULAR | Status: DC | PRN
  Administered 2018-04-04: 4 mg via INTRAVENOUS

## 2018-04-04 MED ORDER — DEXAMETHASONE SODIUM PHOSPHATE 4 MG/ML IJ SOLN
INTRAMUSCULAR | Status: AC
  Filled 2018-04-04: qty 1

## 2018-04-04 MED ORDER — PROPOFOL 10 MG/ML IV BOLUS
INTRAVENOUS | Status: DC | PRN
  Administered 2018-04-04: 200 mg via INTRAVENOUS

## 2018-04-04 MED ORDER — SODIUM CHLORIDE 0.9 % IR SOLN
Status: DC | PRN
  Administered 2018-04-04: 1000 mL

## 2018-04-04 MED ORDER — LIDOCAINE HCL (CARDIAC) PF 100 MG/5ML IV SOSY
PREFILLED_SYRINGE | INTRAVENOUS | Status: DC | PRN
  Administered 2018-04-04: 50 mg via INTRAVENOUS

## 2018-04-04 MED ORDER — BUPIVACAINE LIPOSOME 1.3 % IJ SUSP
INTRAMUSCULAR | Status: DC | PRN
  Administered 2018-04-04: 20 mL

## 2018-04-04 MED ORDER — MIDAZOLAM HCL 2 MG/2ML IJ SOLN
INTRAMUSCULAR | Status: DC | PRN
  Administered 2018-04-04: 2 mg via INTRAVENOUS

## 2018-04-04 MED ORDER — CEFAZOLIN SODIUM-DEXTROSE 2-4 GM/100ML-% IV SOLN
2.0000 g | INTRAVENOUS | Status: AC
  Administered 2018-04-04: 2 g via INTRAVENOUS

## 2018-04-04 MED ORDER — KETOROLAC TROMETHAMINE 30 MG/ML IJ SOLN
INTRAMUSCULAR | Status: AC
  Filled 2018-04-04: qty 1

## 2018-04-04 MED ORDER — FENTANYL CITRATE (PF) 250 MCG/5ML IJ SOLN
INTRAMUSCULAR | Status: AC
  Filled 2018-04-04: qty 5

## 2018-04-04 MED ORDER — KETOROLAC TROMETHAMINE 30 MG/ML IJ SOLN
30.0000 mg | Freq: Once | INTRAMUSCULAR | Status: AC
  Administered 2018-04-04: 30 mg via INTRAVENOUS

## 2018-04-04 MED ORDER — DEXAMETHASONE SODIUM PHOSPHATE 4 MG/ML IJ SOLN
INTRAMUSCULAR | Status: DC | PRN
  Administered 2018-04-04: 4 mg via INTRAVENOUS

## 2018-04-04 MED ORDER — LACTATED RINGERS IV SOLN
INTRAVENOUS | Status: DC | PRN
  Administered 2018-04-04 (×2): via INTRAVENOUS

## 2018-04-04 SURGICAL SUPPLY — 45 items
ADH SKN CLS APL DERMABOND .7 (GAUZE/BANDAGES/DRESSINGS) ×1
CLOTH BEACON ORANGE TIMEOUT ST (SAFETY) ×3 IMPLANT
COVER LIGHT HANDLE STERIS (MISCELLANEOUS) ×6 IMPLANT
DERMABOND ADVANCED (GAUZE/BANDAGES/DRESSINGS) ×2
DERMABOND ADVANCED .7 DNX12 (GAUZE/BANDAGES/DRESSINGS) ×1 IMPLANT
DRAIN PENROSE 18X1/2 LTX STRL (DRAIN) ×3 IMPLANT
ELECT REM PT RETURN 9FT ADLT (ELECTROSURGICAL) ×3
ELECTRODE REM PT RTRN 9FT ADLT (ELECTROSURGICAL) ×1 IMPLANT
GAUZE SPONGE 4X4 12PLY STRL (GAUZE/BANDAGES/DRESSINGS) ×3 IMPLANT
GAUZE SPONGE 4X4 16PLY XRAY LF (GAUZE/BANDAGES/DRESSINGS) ×2 IMPLANT
GLOVE BIOGEL M 6.5 STRL (GLOVE) ×2 IMPLANT
GLOVE BIOGEL PI IND STRL 6.5 (GLOVE) IMPLANT
GLOVE BIOGEL PI IND STRL 7.0 (GLOVE) ×2 IMPLANT
GLOVE BIOGEL PI IND STRL 7.5 (GLOVE) IMPLANT
GLOVE BIOGEL PI INDICATOR 6.5 (GLOVE) ×2
GLOVE BIOGEL PI INDICATOR 7.0 (GLOVE) ×6
GLOVE BIOGEL PI INDICATOR 7.5 (GLOVE) ×2
GLOVE SURG SS PI 7.5 STRL IVOR (GLOVE) ×3 IMPLANT
GOWN STRL REUS W/ TWL XL LVL3 (GOWN DISPOSABLE) ×1 IMPLANT
GOWN STRL REUS W/TWL LRG LVL3 (GOWN DISPOSABLE) ×6 IMPLANT
GOWN STRL REUS W/TWL XL LVL3 (GOWN DISPOSABLE) ×3
INST SET MINOR GENERAL (KITS) ×3 IMPLANT
KIT TURNOVER KIT A (KITS) ×3 IMPLANT
MANIFOLD NEPTUNE II (INSTRUMENTS) ×3 IMPLANT
MESH HERNIA 1.6X1.9 PLUG LRG (Mesh General) IMPLANT
MESH HERNIA PLUG LRG (Mesh General) ×4 IMPLANT
MESH MARLEX PLUG MEDIUM (Mesh General) ×2 IMPLANT
NDL HYPO 21X1.5 SAFETY (NEEDLE) ×1 IMPLANT
NEEDLE HYPO 21X1.5 SAFETY (NEEDLE) ×3 IMPLANT
NS IRRIG 1000ML POUR BTL (IV SOLUTION) ×3 IMPLANT
PACK MINOR (CUSTOM PROCEDURE TRAY) ×3 IMPLANT
PAD ARMBOARD 7.5X6 YLW CONV (MISCELLANEOUS) ×3 IMPLANT
SET BASIN LINEN APH (SET/KITS/TRAYS/PACK) ×3 IMPLANT
SOL PREP PROV IODINE SCRUB 4OZ (MISCELLANEOUS) ×3 IMPLANT
SUT MNCRL AB 4-0 PS2 18 (SUTURE) ×3 IMPLANT
SUT NOVA NAB GS-22 2 2-0 T-19 (SUTURE) ×10 IMPLANT
SUT PROLENE 2 0 SH 30 (SUTURE) IMPLANT
SUT SILK 3 0 (SUTURE)
SUT SILK 3-0 18XBRD TIE 12 (SUTURE) IMPLANT
SUT VIC AB 2-0 CT1 27 (SUTURE) ×3
SUT VIC AB 2-0 CT1 TAPERPNT 27 (SUTURE) ×1 IMPLANT
SUT VIC AB 3-0 SH 27 (SUTURE) ×3
SUT VIC AB 3-0 SH 27X BRD (SUTURE) ×1 IMPLANT
SUT VICRYL AB 3 0 TIES (SUTURE) IMPLANT
SYR 20CC LL (SYRINGE) ×3 IMPLANT

## 2018-04-04 NOTE — Transfer of Care (Signed)
Immediate Anesthesia Transfer of Care Note  Patient: Justin [name]  Procedure(s) Performed: HERNIA REPAIR INGUINAL ADULT WITH MESH (Right )  Patient Location: PACU  Anesthesia Type:General  Level of Consciousness: awake and patient cooperative  Airway & Oxygen Therapy: Patient Spontanous Breathing  Post-op Assessment: Report given to RN and Post -op Vital signs reviewed and stable  Post vital signs: Reviewed and stable  Last Vitals:  Vitals Value Taken Time  BP 128/100 04/04/2018  9:12 AM  Temp    Pulse 81 04/04/2018  9:14 AM  Resp 10 04/04/2018  9:14 AM  SpO2 97 % 04/04/2018  9:14 AM  Vitals shown include unvalidated device data.  Last Pain:  Vitals:   04/04/18 0653  TempSrc: Oral  PainSc: 0-No pain      Patients Stated Pain Goal: 5 (47/18/55 0158)  Complications: No apparent anesthesia complications

## 2018-04-04 NOTE — Interval H&P Note (Signed)
History and Physical Interval Note:  04/04/2018 7:08 AM  Justin [name]  has presented today for surgery, with the diagnosis of right inguinal hernia  The various methods of treatment have been discussed with the patient and family. After consideration of risks, benefits and other options for treatment, the patient has consented to  Procedure(s): HERNIA REPAIR INGUINAL ADULT WITH MESH (Right) as a surgical intervention .  The patient's history has been reviewed, patient examined, no change in status, stable for surgery.  I have reviewed the patient's chart and labs.  Questions were answered to the patient's satisfaction.     Aviva Signs

## 2018-04-04 NOTE — Anesthesia Preprocedure Evaluation (Signed)
Anesthesia Evaluation  Patient identified by MRN, date of birth, ID band Patient awake    Reviewed: Allergy & Precautions, NPO status , Patient's Chart, lab work & pertinent test results  Airway Mallampati: II  TM Distance: >3 FB Neck ROM: Full    Dental no notable dental hx. (+) Teeth Intact   Pulmonary neg pulmonary ROS, sleep apnea and Continuous Positive Airway Pressure Ventilation ,    Pulmonary exam normal breath sounds clear to auscultation       Cardiovascular Exercise Tolerance: Good hypertension, Pt. on medications negative cardio ROS Normal cardiovascular examI Rhythm:Regular Rate:Normal     Neuro/Psych Anxiety negative neurological ROS  negative psych ROS   GI/Hepatic negative GI ROS, Neg liver ROS,   Endo/Other  negative endocrine ROS  Renal/GU Renal diseasenegative Renal ROS  negative genitourinary   Musculoskeletal negative musculoskeletal ROS (+)   Abdominal   Peds negative pediatric ROS (+)  Hematology negative hematology ROS (+)   Anesthesia Other Findings   Reproductive/Obstetrics negative OB ROS                             Anesthesia Physical Anesthesia Plan  ASA: II  Anesthesia Plan: General   Post-op Pain Management:    Induction: Intravenous  PONV Risk Score and Plan:   Airway Management Planned:   Additional Equipment:   Intra-op Plan:   Post-operative Plan: Extubation in OR  Informed Consent: I have reviewed the patients History and Physical, chart, labs and discussed the procedure including the risks, benefits and alternatives for the proposed anesthesia with the patient or authorized representative who has indicated his/her understanding and acceptance.     Plan Discussed with: CRNA  Anesthesia Plan Comments:         Anesthesia Quick Evaluation

## 2018-04-04 NOTE — Discharge Instructions (Signed)
Open Hernia Repair, Adult, Care After This sheet gives you information about how to care for yourself after your procedure. Your health care provider may also give you more specific instructions. If you have problems or questions, contact your health care provider. What can I expect after the procedure? After the procedure, it is common to have:  Mild discomfort.  Slight bruising.  Minor swelling.  Pain in the abdomen.  Follow these instructions at home: Incision care   Follow instructions from your health care provider about how to take care of your incision area. Make sure you: ? Wash your hands with soap and water before you change your bandage (dressing). If soap and water are not available, use hand sanitizer. ? Change your dressing as told by your health care provider. ? Leave stitches (sutures), skin glue, or adhesive strips in place. These skin closures may need to stay in place for 2 weeks or longer. If adhesive strip edges start to loosen and curl up, you may trim the loose edges. Do not remove adhesive strips completely unless your health care provider tells you to do that.  Check your incision area every day for signs of infection. Check for: ? More redness, swelling, or pain. ? More fluid or blood. ? Warmth. ? Pus or a bad smell. Activity  Do not drive or use heavy machinery while taking prescription pain medicine. Do not drive until your health care provider approves.  Until your health care provider approves: ? Do not lift anything that is heavier than 10 lb (4.5 kg). ? Do not play contact sports.  Return to your normal activities as told by your health care provider. Ask your health care provider what activities are safe. General instructions  To prevent or treat constipation while you are taking prescription pain medicine, your health care provider may recommend that you: ? Drink enough fluid to keep your urine clear or pale yellow. ? Take over-the-counter or  prescription medicines. ? Eat foods that are high in fiber, such as fresh fruits and vegetables, whole grains, and beans. ? Limit foods that are high in fat and processed sugars, such as fried and sweet foods.  Take over-the-counter and prescription medicines only as told by your health care provider.  Do not take tub baths or go swimming until your health care provider approves.  Keep all follow-up visits as told by your health care provider. This is important. Contact a health care provider if:  You develop a rash.  You have more redness, swelling, or pain around your incision.  You have more fluid or blood coming from your incision.  Your incision feels warm to the touch.  You have pus or a bad smell coming from your incision.  You have a fever or chills.  You have blood in your stool (feces).  You have not had a bowel movement in 2-3 days.  Your pain is not controlled with medicine. Get help right away if:  You have chest pain or shortness of breath.  You feel light-headed or feel faint.  You have severe pain.  You vomit and your pain is worse. This information is not intended to replace advice given to you by your health care provider. Make sure you discuss any questions you have with your health care provider. Document Released: 05/13/2005 Document Revised: 05/13/2016 Document Reviewed: 04/06/2016 Elsevier Interactive Patient Education  2018 [employer].

## 2018-04-04 NOTE — Anesthesia Procedure Notes (Signed)
Procedure Name: LMA Insertion Date/Time: 04/04/2018 7:40 AM Performed by: Georgeanne Nim, CRNA Pre-anesthesia Checklist: Patient identified, Emergency Drugs available, Suction available, Patient being monitored and Timeout performed Patient Re-evaluated:Patient Re-evaluated prior to induction Oxygen Delivery Method: Circle system utilized Preoxygenation: Pre-oxygenation with 100% oxygen Induction Type: IV induction Ventilation: Mask ventilation without difficulty and Oral airway inserted - appropriate to patient size LMA: LMA inserted LMA Size: 5.0 Number of attempts: 1 Placement Confirmation: positive ETCO2,  CO2 detector and breath sounds checked- equal and bilateral Tube secured with: Tape Dental Injury: Teeth and Oropharynx as per pre-operative assessment

## 2018-04-04 NOTE — Anesthesia Postprocedure Evaluation (Signed)
Anesthesia Post Note  Patient: Justin [name]  Procedure(s) Performed: HERNIA REPAIR INGUINAL ADULT WITH MESH (Right )  Patient location during evaluation: PACU Anesthesia Type: General Level of consciousness: awake Pain management: pain level controlled Vital Signs Assessment: post-procedure vital signs reviewed and stable Respiratory status: spontaneous breathing Cardiovascular status: stable Postop Assessment: no apparent nausea or vomiting and adequate PO intake Anesthetic complications: no     Last Vitals:  Vitals:   04/04/18 0915 04/04/18 0930  BP: 137/82 (!) 138/94  Pulse: 80 70  Resp: 10 10  Temp: 36.9 C   SpO2: 97% 96%    Last Pain:  Vitals:   04/04/18 0930  TempSrc:   PainSc: 0-No pain                 Everette Rank

## 2018-04-04 NOTE — Op Note (Signed)
Patient:  Justin [name]  DOB:  1955/12/27  MRN:  [id number]   Preop Diagnosis: Right inguinal hernia  Postop Diagnosis: Same  Procedure: Right inguinal herniorrhaphy with mesh  Surgeon: Aviva Signs, MD  Anes: General  Indications: Patient is a 62 year old white male who presents with a symptomatic right inguinal hernia.  The risks and benefits of the procedure including bleeding, infection, mesh use, and the possibility of recurrence of the hernia were fully explained to the patient, who gave informed consent.  Procedure note: The patient was placed in the supine position.  After general anesthesia was administered, the right groin region was prepped and draped using usual sterile technique with Betadine.  Surgical site confirmation was performed.  On examination, the patient had small bowel in the hernia sac extending down to the scrotum.  An incision was made in the right groin.  The external Bleich aponeurosis was incised to the external ring.  The vase deferens was noted within the spermatic cord.  A Penrose drain was placed around the spermatic cord.  The hernia was direct in nature.  It was freed away from the spermatic cord and the hernia sac was inverted.  2 large and 1 medium Bard PerFix plugs were placed at the base of the inguinal region.  An onlay patch was then placed over this and secured superiorly to the conjoined tendon and inferiorly to the shelving edge of Poupart's ligament using 2-0 Novafil interrupted sutures.  The internal ring was re-created using a 2-0 Novafil interrupted suture.  The external Bleich aponeurosis was reapproximated using a 2-0 Vicryl running suture.  The subcutaneous layer was reapproximated using a 3-0 Vicryl interrupted suture.  Exparel was instilled into the surrounding wound.  The skin was closed using a 4-0 Monocryl subcuticular suture.  Dermabond was applied.  All tape and needle counts were correct at the end of the procedure.  The patient was  awakened and transferred to PACU in stable condition.  Complications: None  EBL: Minimal  Specimen: None

## 2018-04-05 ENCOUNTER — Encounter (HOSPITAL_COMMUNITY): Payer: Self-pay | Admitting: General Surgery

## 2018-04-07 ENCOUNTER — Other Ambulatory Visit: Payer: Self-pay | Admitting: Family Medicine

## 2018-04-12 ENCOUNTER — Encounter: Payer: Self-pay | Admitting: General Surgery

## 2018-04-12 ENCOUNTER — Ambulatory Visit (INDEPENDENT_AMBULATORY_CARE_PROVIDER_SITE_OTHER): Payer: Self-pay | Admitting: General Surgery

## 2018-04-12 VITALS — BP 151/87 | HR 90 | Temp 98.6°F | Resp 18 | Wt 197.0 lb

## 2018-04-12 DIAGNOSIS — Z09 Encounter for follow-up examination after completed treatment for conditions other than malignant neoplasm: Secondary | ICD-10-CM

## 2018-04-12 NOTE — Progress Notes (Signed)
Subjective:     Justin [name]  Status post right inguinal herniorrhaphy with mesh.  Doing very well.  Pleased with results.  Last took a pain pill 2 days ago.  Is already back at work. Objective:    BP (!) 151/87 (BP Location: Left Arm, Patient Position: Sitting, Cuff Size: Normal)   Pulse 90   Temp 98.6 F (37 C) (Temporal)   Resp 18   Wt 197 lb (89.4 kg)   BMI 25.99 kg/m   General:  alert, cooperative and no distress  Abdomen soft, incision healing well.     Assessment:    Doing well postoperatively.    Plan:   Increase activity as able.  Follow-up here as needed.

## 2018-04-13 ENCOUNTER — Inpatient Hospital Stay (HOSPITAL_COMMUNITY)
Admission: EM | Admit: 2018-04-13 | Discharge: 2018-04-15 | DRG: 066 | Disposition: A | Discharge status: Home / Self Care | Payer: BC Managed Care – PPO | Attending: Internal Medicine | Admitting: Internal Medicine

## 2018-04-13 ENCOUNTER — Encounter (HOSPITAL_COMMUNITY): Payer: Self-pay | Admitting: Emergency Medicine

## 2018-04-13 ENCOUNTER — Emergency Department (HOSPITAL_COMMUNITY): Payer: BC Managed Care – PPO

## 2018-04-13 DIAGNOSIS — E785 Hyperlipidemia, unspecified: Secondary | ICD-10-CM | POA: Diagnosis not present

## 2018-04-13 DIAGNOSIS — R270 Ataxia, unspecified: Secondary | ICD-10-CM | POA: Diagnosis present

## 2018-04-13 DIAGNOSIS — I639 Cerebral infarction, unspecified: Secondary | ICD-10-CM

## 2018-04-13 DIAGNOSIS — R29703 NIHSS score 3: Secondary | ICD-10-CM | POA: Diagnosis not present

## 2018-04-13 DIAGNOSIS — I10 Essential (primary) hypertension: Secondary | ICD-10-CM | POA: Diagnosis present

## 2018-04-13 DIAGNOSIS — I6340 Cerebral infarction due to embolism of unspecified cerebral artery: Principal | ICD-10-CM | POA: Diagnosis present

## 2018-04-13 DIAGNOSIS — Z823 Family history of stroke: Secondary | ICD-10-CM | POA: Diagnosis not present

## 2018-04-13 DIAGNOSIS — Z79899 Other long term (current) drug therapy: Secondary | ICD-10-CM

## 2018-04-13 DIAGNOSIS — R200 Anesthesia of skin: Secondary | ICD-10-CM | POA: Diagnosis not present

## 2018-04-13 DIAGNOSIS — E1165 Type 2 diabetes mellitus with hyperglycemia: Secondary | ICD-10-CM | POA: Diagnosis present

## 2018-04-13 DIAGNOSIS — F419 Anxiety disorder, unspecified: Secondary | ICD-10-CM | POA: Diagnosis present

## 2018-04-13 DIAGNOSIS — R739 Hyperglycemia, unspecified: Secondary | ICD-10-CM | POA: Diagnosis present

## 2018-04-13 DIAGNOSIS — K4031 Unilateral inguinal hernia, with obstruction, without gangrene, recurrent: Secondary | ICD-10-CM

## 2018-04-13 DIAGNOSIS — G4733 Obstructive sleep apnea (adult) (pediatric): Secondary | ICD-10-CM | POA: Diagnosis present

## 2018-04-13 LAB — CBC
HEMATOCRIT: 45.1 % (ref 39.0–52.0)
HEMOGLOBIN: 15.1 g/dL (ref 13.0–17.0)
MCH: 32.1 pg (ref 26.0–34.0)
MCHC: 33.5 g/dL (ref 30.0–36.0)
MCV: 95.8 fL (ref 78.0–100.0)
Platelets: 353 10*3/uL (ref 150–400)
RBC: 4.71 MIL/uL (ref 4.22–5.81)
RDW: 12.2 % (ref 11.5–15.5)
WBC: 9.6 10*3/uL (ref 4.0–10.5)

## 2018-04-13 LAB — ETHANOL: Alcohol, Ethyl (B): <10 mg/dL (ref <10)

## 2018-04-13 LAB — COMPREHENSIVE METABOLIC PANEL
ALBUMIN: 3.9 g/dL (ref 3.5–5.0)
ALK PHOS: 69 U/L (ref 38–126)
ALT: 22 U/L (ref 17–63)
ANION GAP: 9 (ref 5–15)
AST: 21 U/L (ref 15–41)
BILIRUBIN TOTAL: 1.1 mg/dL (ref 0.3–1.2)
BUN: 24 mg/dL — AB (ref 6–20)
CALCIUM: 9.7 mg/dL (ref 8.9–10.3)
CO2: 28 mmol/L (ref 22–32)
Chloride: 103 mmol/L (ref 101–111)
Creatinine, Ser: 1.05 mg/dL (ref 0.61–1.24)
GFR calc Af Amer: >60 mL/min (ref >60)
Glucose, Bld: 97 mg/dL (ref 65–99)
POTASSIUM: 3.5 mmol/L (ref 3.5–5.1)
Sodium: 140 mmol/L (ref 135–145)
TOTAL PROTEIN: 7.3 g/dL (ref 6.5–8.1)

## 2018-04-13 LAB — PROTIME-INR
INR: 1.12
Prothrombin Time: 14.3 seconds (ref 11.4–15.2)

## 2018-04-13 LAB — RAPID URINE DRUG SCREEN, HOSP PERFORMED
Amphetamines: NOT DETECTED
Barbiturates: NOT DETECTED
Benzodiazepines: NOT DETECTED
Cocaine: NOT DETECTED
Opiates: NOT DETECTED
Tetrahydrocannabinol: NOT DETECTED

## 2018-04-13 LAB — URINALYSIS, ROUTINE W REFLEX MICROSCOPIC
Bilirubin Urine: NEGATIVE
Glucose, UA: NEGATIVE mg/dL
Hgb urine dipstick: NEGATIVE
Ketones, ur: NEGATIVE mg/dL
LEUKOCYTES UA: NEGATIVE
NITRITE: NEGATIVE
PROTEIN: NEGATIVE mg/dL
Specific Gravity, Urine: 1.015 (ref 1.005–1.030)
pH: 7 (ref 5.0–8.0)

## 2018-04-13 LAB — I-STAT TROPONIN, ED: TROPONIN I, POC: 0.01 ng/mL (ref 0.00–0.08)

## 2018-04-13 LAB — DIFFERENTIAL
Basophils Absolute: 0 10*3/uL (ref 0.0–0.1)
Basophils Relative: 0 %
EOS PCT: 2 %
Eosinophils Absolute: 0.2 10*3/uL (ref 0.0–0.7)
LYMPHS ABS: 1.7 10*3/uL (ref 0.7–4.0)
LYMPHS PCT: 18 %
MONOS PCT: 9 %
Monocytes Absolute: 0.9 10*3/uL (ref 0.1–1.0)
NEUTROS PCT: 71 %
Neutro Abs: 6.9 10*3/uL (ref 1.7–7.7)

## 2018-04-13 LAB — APTT: aPTT: 30 seconds (ref 24–36)

## 2018-04-13 MED ORDER — ASPIRIN 81 MG PO CHEW
324.0000 mg | CHEWABLE_TABLET | Freq: Once | ORAL | Status: AC
  Administered 2018-04-13: 324 mg via ORAL
  Filled 2018-04-13: qty 4

## 2018-04-13 MED ORDER — ACETAMINOPHEN 160 MG/5ML PO SOLN: 650.0000 mg | ORAL | Status: DC | PRN

## 2018-04-13 MED ORDER — ASPIRIN 300 MG RE SUPP: 300.0000 mg | Freq: Every day | RECTAL | Status: DC

## 2018-04-13 MED ORDER — ASPIRIN 325 MG PO TABS
325.0000 mg | ORAL_TABLET | Freq: Every day | ORAL | Status: DC
  Administered 2018-04-14 – 2018-04-15 (×2): 325 mg via ORAL
  Filled 2018-04-13 (×2): qty 1

## 2018-04-13 MED ORDER — ACETAMINOPHEN 650 MG RE SUPP: 650.0000 mg | RECTAL | Status: DC | PRN

## 2018-04-13 MED ORDER — INSULIN ASPART 100 UNIT/ML ~~LOC~~ SOLN: 0.0000 [IU] | Freq: Three times a day (TID) | SUBCUTANEOUS | Status: DC

## 2018-04-13 MED ORDER — SENNOSIDES-DOCUSATE SODIUM 8.6-50 MG PO TABS: 1.0000 | ORAL_TABLET | Freq: Every evening | ORAL | Status: DC | PRN

## 2018-04-13 MED ORDER — STROKE: EARLY STAGES OF RECOVERY BOOK
Freq: Once | Status: AC
  Administered 2018-04-13

## 2018-04-13 MED ORDER — ACETAMINOPHEN 325 MG PO TABS: 650.0000 mg | ORAL_TABLET | ORAL | Status: DC | PRN

## 2018-04-13 MED ORDER — ENOXAPARIN SODIUM 40 MG/0.4ML ~~LOC~~ SOLN
40.0000 mg | SUBCUTANEOUS | Status: DC
  Administered 2018-04-14 – 2018-04-15 (×2): 40 mg via SUBCUTANEOUS
  Filled 2018-04-13 (×2): qty 0.4

## 2018-04-13 MED ORDER — MESALAMINE 400 MG PO CPDR
800.0000 mg | DELAYED_RELEASE_CAPSULE | Freq: Two times a day (BID) | ORAL | Status: DC
  Administered 2018-04-14 – 2018-04-15 (×4): 800 mg via ORAL
  Filled 2018-04-13 (×6): qty 2

## 2018-04-13 NOTE — ED Provider Notes (Signed)
[facility] EMERGENCY DEPARTMENT Provider Note   CSN: [id number] Arrival date & time: 04/13/18  1601     History   Chief Complaint Chief Complaint  Patient presents with  . Code Stroke    HPI Justin [name] is a 62 y.o. male.  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  States he had a similar episode several days ago that resolved spontaneously.  He denies any visual difficulty, speech difficulty.  Denies headache, chest pain, shortness of breath or abdominal pain.  Patient is not on blood thinners but does take 81 mg aspirin.  No history of bleeding including intracranial bleed or gastrointestinal bleeding.  Patient is right-hand dominant. Past Medical History:  Diagnosis Date  . Anxiety   . Diarrhea   . Hypertension   . OSA (obstructive sleep apnea)    cpap 9 cm H2O    Patient Active Problem List   Diagnosis Date Noted  . Non-recurrent unilateral inguinal hernia without obstruction or gangrene   . Obstructive sleep apnea treated with continuous positive airway pressure (CPAP) 03/13/2018  . Hypersomnia with sleep apnea 12/15/2017  . Diarrhea 05/08/2017  . Chest pain 05/19/2015  . Anxiety 05/19/2015  . Acute renal failure ([place]) 05/19/2015  . Hyperglycemia 05/19/2015  . Hypertension     Past Surgical History:  Procedure Laterality Date  . BIOPSY  06/21/2017   Procedure: BIOPSY;  Surgeon: Rogene Houston, MD;  Location: [facility];  Service: Endoscopy;;  rectal, righta and left colon;  . COLONOSCOPY N/A 06/21/2017   Procedure: COLONOSCOPY;  Surgeon: Rogene Houston, MD;  Location: [facility];  Service: Endoscopy;  Laterality: N/A;  1:25  . INGUINAL HERNIA REPAIR Right 04/04/2018   Procedure: HERNIA REPAIR INGUINAL ADULT WITH MESH;  Surgeon: Aviva Signs, MD;  Location: [facility] ORS;  Service:  General;  Laterality: Right;  . KNEE SURGERY     tore mcl in high school (70's)        Home Medications    Prior to Admission medications   Medication Sig Start Date End Date Taking? Authorizing Provider  aspirin EC 81 MG tablet Take 1 tablet (81 mg total) by mouth daily. 06/22/17   Rogene Houston, MD  Cholecalciferol (VITAMIN D3) 2000 units TABS Take 2,000 Units by mouth daily.    [provider]  diphenhydrAMINE (BENADRYL) 25 MG tablet Take 25 mg at bedtime as needed by mouth for sleep.    [provider]  HYDROcodone-acetaminophen (NORCO) 5-325 MG tablet Take 1 tablet by mouth every 4 (four) hours as needed for moderate pain. Patient not taking: Reported on 04/12/2018 04/04/18   Aviva Signs, MD  losartan (COZAAR) 100 MG tablet TAKE ONE (1) TABLET BY MOUTH EVERY DAY 04/09/18   [name], MD  Menthol, Topical Analgesic, (BIOFREEZE ROLL-ON EX) Apply 1 application topically daily as needed (for knee pain).    [provider]  Mesalamine (DELZICOL) 400 MG CPDR DR capsule Take 800 mg by mouth 2 (two) times daily.    [provider]  naproxen sodium (ALEVE) 220 MG tablet Take 220 mg by mouth daily as needed (for pain or headache).     [provider]  vitamin C (ASCORBIC ACID) 500 MG tablet Take 500 mg by mouth daily.    [provider]  Family History Family History  Problem Relation Age of Onset  . Tuberculosis Mother   . Stroke Mother   . Heart disease Father        died at 47  . Bradycardia Brother        pacemaker  . Cancer Paternal Uncle   . Mental illness Paternal Grandfather        suicide at 24  . Colon cancer Neg Hx     Social History Social History   Tobacco Use  . Smoking status: Never Smoker  . Smokeless tobacco: Never Used  Substance Use Topics  . Alcohol use: No    Alcohol/week: 0.0 oz  . Drug use: No     Allergies   Patient has no known allergies.   Review of Systems Review of Systems    Constitutional: Negative for chills and fever.  HENT: Negative for facial swelling, sore throat and trouble swallowing.   Eyes: Negative for visual disturbance.  Respiratory: Negative for cough and shortness of breath.   Cardiovascular: Negative for chest pain, palpitations and leg swelling.  Gastrointestinal: Negative for abdominal pain, diarrhea, nausea and vomiting.  Musculoskeletal: Negative for back pain, gait problem, myalgias and neck pain.  Skin: Negative for rash and wound.  Neurological: Positive for numbness. Negative for dizziness, speech difficulty, weakness, light-headedness and headaches.  All other systems reviewed and are negative.    Physical Exam Updated Vital Signs BP (!) 172/104   Pulse 66   Temp 98.4 F (36.9 C)   Resp 16   Wt 90.2 kg (198 lb 12.8 oz)   SpO2 99%   BMI 26.23 kg/m   Physical Exam  Constitutional: He is oriented to person, place, and time. He appears well-developed and well-nourished. No distress.  HENT:  Head: Normocephalic and atraumatic.  Mouth/Throat: Oropharynx is clear and moist.  Decreased sensation to light touch in the right cheek compared to left.  Sensation to light touch across the forehead appears intact.  No facial asymmetry.  Eyes: Pupils are equal, round, and reactive to light. EOM are normal.  Neck: Normal range of motion. Neck supple.  No meningismus.  Cardiovascular: Normal rate and regular rhythm. Exam reveals no gallop and no friction rub.  No murmur heard. Pulmonary/Chest: Effort normal and breath sounds normal. No stridor. No respiratory distress. He has no wheezes. He has no rales. He exhibits no tenderness.  Abdominal: Soft. Bowel sounds are normal. There is no tenderness. There is no rebound and no guarding.  Musculoskeletal: Normal range of motion. He exhibits no edema or tenderness.  Neurological: He is alert and oriented to person, place, and time.  Decreased sensation light touch in right upper and right lower  extremities.  Patient has ataxia with right upper extremity finger-to-nose testing.  5/5 motor in all extremities.  Clear voice.   Skin: Skin is warm and dry. Capillary refill takes less than 2 seconds. No rash noted. He is not diaphoretic. No erythema.  Psychiatric: He has a normal mood and affect. His behavior is normal.  Nursing note and vitals reviewed.    ED Treatments / Results  Labs (all labs ordered are listed, but only abnormal results are displayed) Labs Reviewed  COMPREHENSIVE METABOLIC PANEL - Abnormal; Notable for the following components:      Result Value   BUN 24 (*)    All other components within normal limits  ETHANOL  PROTIME-INR  APTT  CBC  DIFFERENTIAL  RAPID URINE DRUG SCREEN, HOSP PERFORMED  URINALYSIS,  ROUTINE W REFLEX MICROSCOPIC  I-STAT TROPONIN, ED    EKG EKG Interpretation  Date/Time:  Friday April 13 2018 16:15:14 EDT Ventricular Rate:  81 PR Interval:    QRS Duration: 97 QT Interval:  379 QTC Calculation: 440 R Axis:   90 Text Interpretation:  Sinus rhythm Borderline right axis deviation Borderline T wave abnormalities Confirmed by Julianne Rice [phone]) on 04/13/2018 4:30:05 PM   Radiology Ct Head Code Stroke Wo Contrast  Result Date: 04/13/2018 CLINICAL DATA:  Code stroke. Right-sided numbness starting today at 3 p.m. Ataxia EXAM: CT HEAD WITHOUT CONTRAST TECHNIQUE: Contiguous axial images were obtained from the base of the skull through the vertex without intravenous contrast. COMPARISON:  None. FINDINGS: Brain: No evidence of acute infarction, hemorrhage, hydrocephalus, extra-axial collection or mass lesion/mass effect. Vascular: No hyperdense vessel.  Atherosclerotic calcification. Skull: Normal. Negative for fracture or focal lesion. Sinuses/Orbits: No acute finding. Other: These results were called by telephone at the time of interpretation on 04/13/2018 at 4:31 pm to Dr. Julianne Rice , who verbally acknowledged these results. ASPECTS  [facility] Stroke Program Early CT Score) - Ganglionic level infarction (caudate, lentiform nuclei, internal capsule, insula, M1-M3 cortex): 7 - Supraganglionic infarction (M4-M6 cortex): 3 Total score (0-10 with 10 being normal): 10 IMPRESSION: No acute finding.  No evidence of infarct.  Negative for hemorrhage. Electronically Signed   By: Monte Fantasia M.D.   On: 04/13/2018 16:31    Procedures Procedures (including critical care time)  Medications Ordered in ED Medications  aspirin chewable tablet 324 mg (324 mg Oral Given 04/13/18 1707)     Initial Impression / Assessment and Plan / ED Course  I have reviewed the triage vital signs and the nursing notes.  Pertinent labs & imaging results that were available during my care of the patient were reviewed by me and considered in my medical decision making (see chart for details).     Code stroke was called at 1615.  Code Stroke order set was placed.   CT head without acute findings.  Discussed with tele-neurologist.  Advises against giving TPA given recent hernia surgery and mild symptoms.  Recommends giving aspirin.  Discussed with hospitalist. Final Clinical Impressions(s) / ED Diagnoses   Final diagnoses:  Acute ischemic stroke [facility])    ED Discharge Orders    None       Julianne Rice, MD 04/13/18 [phone]

## 2018-04-13 NOTE — ED Notes (Signed)
Code Stroke paged out

## 2018-04-13 NOTE — ED Notes (Signed)
CT called and ready

## 2018-04-13 NOTE — ED Notes (Signed)
Code stroke to be called at this time by Dr Lita Mains.

## 2018-04-13 NOTE — H&P (Signed)
History and Physical  Justin [name] IBB:[id number] DOB: 26-Apr-1956 DOA: 04/13/2018  Referring physician: Dr Lita Mains, ED physician PCP: [name], MD   Patient Coming From: home  Chief Complaint: right facial and body numbness   HPI: Justin [name] is a 62 y.o. male with a history of HTN, OSA with CPAP, anxiety. Patient seen for numbness that started at 2:45pm, starting around the right side of his lips, then progressed to his face, right arm, and right leg.  Patient went home from work was immediately taken to the emergency department for evaluation.  Symptoms have stayed the same and are not worsening.  No palliating or provoking factors.  Patient did receive an aspirin was evaluated by tele-neurology.  CT normal.  Labs normal. Patient otherwise feels fine was without Strength deficit or coordination deficit.  Review of Systems:   Pt denies any fevers, chills, nausea, vomiting, diarrhea, constipation, abdominal pain, shortness of breath, dyspnea on exertion, orthopnea, cough, wheezing, palpitations, headache, vision changes, lightheadedness, dizziness, melena, rectal bleeding.  Review of systems are otherwise negative  Past Medical History:  Diagnosis Date  . Anxiety   . Diarrhea   . Hypertension   . OSA (obstructive sleep apnea)    cpap 9 cm H2O   Past Surgical History:  Procedure Laterality Date  . BIOPSY  06/21/2017   Procedure: BIOPSY;  Surgeon: Rogene Houston, MD;  Location: [facility];  Service: Endoscopy;;  rectal, righta and left colon;  . COLONOSCOPY N/A 06/21/2017   Procedure: COLONOSCOPY;  Surgeon: Rogene Houston, MD;  Location: [facility];  Service: Endoscopy;  Laterality: N/A;  1:25  . INGUINAL HERNIA REPAIR Right 04/04/2018   Procedure: HERNIA REPAIR INGUINAL ADULT WITH MESH;  Surgeon: Aviva Signs, MD;  Location: [facility] ORS;  Service: General;  Laterality: Right;  . KNEE SURGERY     tore mcl in high school (70's)   Social History:  reports that he has  never smoked. He has never used smokeless tobacco. He reports that he does not drink alcohol or use drugs. Patient lives at home  No Known Allergies  Family History  Problem Relation Age of Onset  . Tuberculosis Mother   . Stroke Mother   . Heart disease Father        died at 2  . Bradycardia Brother        pacemaker  . Cancer Paternal Uncle   . Mental illness Paternal Grandfather        suicide at 80  . Colon cancer Neg Hx      Prior to Admission medications   Medication Sig Start Date End Date Taking? Authorizing Provider  aspirin EC 81 MG tablet Take 1 tablet (81 mg total) by mouth daily. 06/22/17   Rogene Houston, MD  Cholecalciferol (VITAMIN D3) 2000 units TABS Take 2,000 Units by mouth daily.    [provider]  diphenhydrAMINE (BENADRYL) 25 MG tablet Take 25 mg at bedtime as needed by mouth for sleep.    [provider]  HYDROcodone-acetaminophen (NORCO) 5-325 MG tablet Take 1 tablet by mouth every 4 (four) hours as needed for moderate pain. Patient not taking: Reported on 04/12/2018 04/04/18   Aviva Signs, MD  losartan (COZAAR) 100 MG tablet TAKE ONE (1) TABLET BY MOUTH EVERY DAY 04/09/18   [name], MD  Menthol, Topical Analgesic, (BIOFREEZE ROLL-ON EX) Apply 1 application topically daily as needed (for knee pain).    [provider]  Mesalamine (  DELZICOL) 400 MG CPDR DR capsule Take 800 mg by mouth 2 (two) times daily.    [provider]  naproxen sodium (ALEVE) 220 MG tablet Take 220 mg by mouth daily as needed (for pain or headache).     [provider]  vitamin C (ASCORBIC ACID) 500 MG tablet Take 500 mg by mouth daily.    [provider]    Physical Exam: BP (!) 148/103 (BP Location: Left Arm)   Pulse 73   Temp 98.4 F (36.9 C)   Resp 16   Wt 90.2 kg (198 lb 12.8 oz)   SpO2 100%   BMI 26.23 kg/m   . General: Middle-aged Caucasian male. Awake and alert and oriented x3. No acute cardiopulmonary  distress.  Marland Kitchen HEENT: Normocephalic atraumatic.  Right and left ears normal in appearance.  Pupils equal, round, reactive to light. Extraocular muscles are intact. Sclerae anicteric and noninjected.  Moist mucosal membranes. No mucosal lesions.  . Neck: Neck supple without lymphadenopathy. No carotid bruits. No masses palpated.  . Cardiovascular: Regular rate with normal S1-S2 sounds. No murmurs, rubs, gallops auscultated. No JVD.  Marland Kitchen Respiratory: Good respiratory effort with no wheezes, rales, rhonchi. Lungs clear to auscultation bilaterally.  No accessory muscle use. . Abdomen: Soft, nontender, nondistended. Active bowel sounds. No masses or hepatosplenomegaly  . Skin: No rashes, lesions, or ulcerations.  Dry, warm to touch. 2+ dorsalis pedis and radial pulses. . Musculoskeletal: No calf or leg pain. All major joints not erythematous nontender.  No upper or lower joint deformation.  Good ROM.  No contractures  . Psychiatric: Intact judgment and insight. Pleasant and cooperative. . Neurologic: Slightly diminished sensation on the right face, right arm, right leg. no focal neurological deficits. Strength is 5/5 and symmetric in upper and lower extremities.  Cranial nerves II through XII are grossly intact.           Labs on Admission: I have personally reviewed following labs and imaging studies  CBC: Recent Labs  Lab 04/13/18 1618  WBC 9.6  NEUTROABS 6.9  HGB 15.1  HCT 45.1  MCV 95.8  PLT 027   Basic Metabolic Panel: Recent Labs  Lab 04/13/18 1618  NA 140  K 3.5  CL 103  CO2 28  GLUCOSE 97  BUN 24*  CREATININE 1.05  CALCIUM 9.7   GFR: Estimated Creatinine Clearance: 82.4 mL/min (by C-G formula based on SCr of 1.05 mg/dL). Liver Function Tests: Recent Labs  Lab 04/13/18 1618  AST 21  ALT 22  ALKPHOS 69  BILITOT 1.1  PROT 7.3  ALBUMIN 3.9   No results for input(s): LIPASE, AMYLASE in the last 168 hours. No results for input(s): AMMONIA in the last 168  hours. Coagulation Profile: Recent Labs  Lab 04/13/18 1618  INR 1.12   Cardiac Enzymes: No results for input(s): CKTOTAL, CKMB, CKMBINDEX, TROPONINI in the last 168 hours. BNP (last 3 results) No results for input(s): PROBNP in the last 8760 hours. HbA1C: No results for input(s): HGBA1C in the last 72 hours. CBG: No results for input(s): GLUCAP in the last 168 hours. Lipid Profile: No results for input(s): CHOL, HDL, LDLCALC, TRIG, CHOLHDL, LDLDIRECT in the last 72 hours. Thyroid Function Tests: No results for input(s): TSH, T4TOTAL, FREET4, T3FREE, THYROIDAB in the last 72 hours. Anemia Panel: No results for input(s): VITAMINB12, FOLATE, FERRITIN, TIBC, IRON, RETICCTPCT in the last 72 hours. Urine analysis: No results found for: COLORURINE, APPEARANCEUR, [place], [place], [place], [place], [place], [place], [place], [place],  NITRITE, LEUKOCYTESUR Sepsis Labs: @LABRCNTIP (procalcitonin:4,lacticidven:4) )No results found for this or any previous visit (from the past 240 hour(s)).   Radiological Exams on Admission: Ct Head Code Stroke Wo Contrast  Result Date: 04/13/2018 CLINICAL DATA:  Code stroke. Right-sided numbness starting today at 3 p.m. Ataxia EXAM: CT HEAD WITHOUT CONTRAST TECHNIQUE: Contiguous axial images were obtained from the base of the skull through the vertex without intravenous contrast. COMPARISON:  None. FINDINGS: Brain: No evidence of acute infarction, hemorrhage, hydrocephalus, extra-axial collection or mass lesion/mass effect. Vascular: No hyperdense vessel.  Atherosclerotic calcification. Skull: Normal. Negative for fracture or focal lesion. Sinuses/Orbits: No acute finding. Other: These results were called by telephone at the time of interpretation on 04/13/2018 at 4:31 pm to Dr. Julianne Rice , who verbally acknowledged these results. ASPECTS [facility] Stroke Program Early CT Score) - Ganglionic level infarction (caudate, lentiform nuclei, internal  capsule, insula, M1-M3 cortex): 7 - Supraganglionic infarction (M4-M6 cortex): 3 Total score (0-10 with 10 being normal): 10 IMPRESSION: No acute finding.  No evidence of infarct.  Negative for hemorrhage. Electronically Signed   By: Monte Fantasia M.D.   On: 04/13/2018 16:31    EKG: Independently reviewed.  Sinus rhythm with borderline T wave abnormalities unchanged from previous  Assessment/Plan: Principal Problem:   Right facial numbness Active Problems:   Hyperglycemia   Hypertension   Non-recurrent unilateral inguinal hernia without obstruction or gangrene    This patient was discussed with the ED physician, including pertinent vitals, physical exam findings, labs, and imaging.  We also discussed care given by the ED provider.  1. Right face numbness Observation on telemetry MRI/MRA head, neck Echocardiogram tomorrow Hemoglobin A1c, lipid panel in the morning PT/OT/speech therapy consult Full aspirin  hold antihypertensives and allow permissive hypertension 2. Hypertension a. Permissive hypertension 3. Hyperglycemia a. Under glycemic control 4. Inguinal hernia repair a. No problems  DVT prophylaxis: Lovenox Consultants: None Code Status: Full code Family Communication: Wife in the room Disposition Plan: Patient should be able to return home following admission   Toneisha Savary [place] [facility] Pager [phone]  If 7PM-7AM, please contact night-coverage [email or web address] Password [email or web address]

## 2018-04-13 NOTE — ED Notes (Signed)
Dr Lita Mains at bedside.

## 2018-04-13 NOTE — Consult Note (Signed)
Date of Service 04/13/2018  [facility] Tele[facility] Consult Services  Comments: Last time known well:  _ 15:00 Door time:  _1601 [facility] contacted: _1624 [facility] at bedside: _1630 NIHSS assessment time: _ 16:34 consult end time: _1649 --------------------------------------------------------------------------- Impression:  acute right sided numbness and ataxia Consistent with Acute Ischemic Stroke  Does (not) meet Large Vessel Occlusion (LVO) screening criteria (Aphasia, Neglect, Gaze deviation/preference, Dense hemiparesis, or Visual field deficits on exam), therefore advanced imaging (CTA head and neck and CTP brain) is (not) indicated.   Differential Diagnosis:  1. Cardioembolic stroke  2. Small vessel disease/ lacune  3. Thromboembolic, artery-to-artery mechanism  4. Hypercoagulable state-related infarct  5. Transient ischemic attack  6. Thrombotic mechanism, large artery disease   ------------------------------------------------------------------------------------------- tPA decision and other recommendations:   _ Patient is not a tPA candidate Head CT did not show any acute hemorrhage. reviewed report (if available) and images  Reason: _ recent surgery, I reviewed the surgical report, in my opinion the degree of intervention/manipulation is significant enough to increase risk of hemorrhage with IV tpa and since symptoms are mild and nondebilitating, I do not recommend IV tPA. Patient is not a NIR candidate: _ Thrombectomy not considered since large proximal intracranial vessel occlusion is not suspected. Recommendations dysphagia screen ASA if no contraindications head of bed flat IV fluids NS Stroke work up with: noncontrast brain MRI, head and neck MRA (or CTA), 2D ECHO, lipid panel, HbA1c (Goal LDL<70, HbA1c<7)  inpatient neurology consultation Inpatient stroke evaluation as per Neurology/ Internal Medicine Discussed with ED physician/medical  staff Please contact [facility] Navigator to reach me if further questions/concerns arise.   -------------------------------------------------------------------------------------------- Reason for Stroke Alert and History of Present Illness:  _ Patient is a(n) 62 years old male     , with history of OSA, diverticulitis, hernia surgery last week, hypertension last known well: 15:00   started numbness right side of lips then  Blood Pressure: 170/103   ---------------------------------------------------------------------------------------------- Review of Systems:  Constitutional:  Negative except as documented in history of present illness.   Eye:  Negative except as documented in history of present illness.   Ear/Nose/Mouth/Throat:  Negative except as documented in history of present illness.   Respiratory:  Negative except as documented in history of present illness.   Cardiovascular:  Negative except as documented in history of present illness.   Gastrointestinal:  Negative except as documented in history of present illness.   Musculoskeletal:  Negative except as documented in history of present illness.   Neurologic:  Negative except as documented in history of present illness.    --------------------------------------------------------------------------------------------------- Examination:   NIHSS Details documented in the note ___  # NIHSS - NIH STROKE SCALE - 11 items:  1A: Level of consciousness: 0 - Alert; keenly responsive.  1B: Ask month and age: 39 - Both questions right  1C: 'Blink eyes' & 'squeeze hands': 0 - Performs both tasks  2: Horizontal extraocular movements: 0 - Normal.  3: Visual fields: 0 - No visual loss.  4: Facial palsy: 0 - Normal symmetry.  5A: Left arm motor drift: 0 - No drift for 10 seconds, or Amputation/joint fusion.  5B: Right arm motor drift: 0 - No drift for 10 seconds, or Amputation/joint fusion.  6A: Left leg motor drift: 0  - No drift for 5 seconds, or Amputation/joint fusion.  6B: Right leg motor drift: 0 - No drift for 5 seconds, or Amputation/joint fusion.  7: Limb Ataxia: 2 - Ataxia in 2 Limbs.  8: Sensation:  1 - Mild-moderate loss: less sharp/more dull, can sense being touched.  9: Language/aphasia: 0 - Normal; no aphasia.  10: Dysarthria: 0 - Normal, or Intubated/unable to test  11: Extinction/inattention: 0 - No abnormality   == Score: 3.  ------------------------------------------------------------------------------------------------------- Medical Decision Making:  - Extensive number of diagnosis or management options are considered above.   - Extensive amount of complex data reviewed.   - High risk of complication and/or morbidity or mortality are associated with differential diagnostic considerations above.  - There may be Uncertain outcome and increased probability of prolonged functional impairment or high probability of severe prolonged functional impairment associated with some of these differential diagnoses.  Medical Data Reviewed:  1.Data reviewed include clinical labs, radiology, Medical Tests;   2.Tests results discussed w/performing or interpreting physician;   3.Obtaining/reviewing old medical records;  4.Obtaining case history from another source;  5.Independent review of image, tracing or specimen.    When possible Patient/family were informed the Neurology Consult would happen via TeleHealth consult by way of interactive audio and video telecommunications and consented to receiving care in this manner.  Case discussed with the Medical staff.  Critical Care notation:  I was called to see this critical patient emergently. I personally evaluated this critical patient for acute stroke evaluation and determining their eligibility for IV Alteplase and interventional therapies. I have spent approximately _19_ minutes with the patient, including time at bedside, time discussing the  case with other physicians, reviewing plan of care, and time independently reviewing the records and scans.

## 2018-04-13 NOTE — Progress Notes (Signed)
Code Stroke Times:   4665 Call Time Gordon 9935 Exam Started  7017 Exam Finished  7939 images sent  0300 Exam completed  9233 GR called

## 2018-04-13 NOTE — ED Triage Notes (Signed)
Patient complaining of right sided numbness starting at 1500 today. Grips equal and patient ambulatory with no assistance or difficulty at triage. No facial droop or slurred speech.

## 2018-04-14 ENCOUNTER — Other Ambulatory Visit (HOSPITAL_COMMUNITY): Payer: Self-pay

## 2018-04-14 ENCOUNTER — Observation Stay (HOSPITAL_COMMUNITY): Payer: BC Managed Care – PPO

## 2018-04-14 DIAGNOSIS — E1165 Type 2 diabetes mellitus with hyperglycemia: Secondary | ICD-10-CM | POA: Diagnosis present

## 2018-04-14 DIAGNOSIS — G4733 Obstructive sleep apnea (adult) (pediatric): Secondary | ICD-10-CM | POA: Diagnosis present

## 2018-04-14 DIAGNOSIS — I10 Essential (primary) hypertension: Secondary | ICD-10-CM

## 2018-04-14 DIAGNOSIS — R739 Hyperglycemia, unspecified: Secondary | ICD-10-CM

## 2018-04-14 DIAGNOSIS — Z823 Family history of stroke: Secondary | ICD-10-CM | POA: Diagnosis not present

## 2018-04-14 DIAGNOSIS — R270 Ataxia, unspecified: Secondary | ICD-10-CM | POA: Diagnosis present

## 2018-04-14 DIAGNOSIS — R200 Anesthesia of skin: Secondary | ICD-10-CM | POA: Diagnosis not present

## 2018-04-14 DIAGNOSIS — E785 Hyperlipidemia, unspecified: Secondary | ICD-10-CM | POA: Diagnosis present

## 2018-04-14 DIAGNOSIS — I639 Cerebral infarction, unspecified: Secondary | ICD-10-CM

## 2018-04-14 DIAGNOSIS — R29703 NIHSS score 3: Secondary | ICD-10-CM | POA: Diagnosis present

## 2018-04-14 DIAGNOSIS — Z79899 Other long term (current) drug therapy: Secondary | ICD-10-CM | POA: Diagnosis not present

## 2018-04-14 DIAGNOSIS — F419 Anxiety disorder, unspecified: Secondary | ICD-10-CM | POA: Diagnosis present

## 2018-04-14 DIAGNOSIS — I6340 Cerebral infarction due to embolism of unspecified cerebral artery: Secondary | ICD-10-CM | POA: Diagnosis present

## 2018-04-14 LAB — LIPID PANEL
Cholesterol: 133 mg/dL (ref 0–200)
HDL: 30 mg/dL — AB (ref >40)
LDL CALC: 88 mg/dL (ref 0–99)
Total CHOL/HDL Ratio: 4.4 RATIO
Triglycerides: 75 mg/dL (ref <150)
VLDL: 15 mg/dL (ref 0–40)

## 2018-04-14 LAB — HEMOGLOBIN A1C
HEMOGLOBIN A1C: 5.2 % (ref 4.8–5.6)
Mean Plasma Glucose: 102.54 mg/dL

## 2018-04-14 LAB — GLUCOSE, CAPILLARY
GLUCOSE-CAPILLARY: 102 mg/dL — AB (ref 65–99)
Glucose-Capillary: 89 mg/dL (ref 65–99)
Glucose-Capillary: 104 mg/dL — ABNORMAL HIGH (ref 65–99)
Glucose-Capillary: 107 mg/dL — ABNORMAL HIGH (ref 65–99)

## 2018-04-14 LAB — ECHOCARDIOGRAM COMPLETE
Height: 73 in
WEIGHTICAEL: 3118.19 [oz_av]

## 2018-04-14 MED ORDER — GADOBENATE DIMEGLUMINE 529 MG/ML IV SOLN
20.0000 mL | Freq: Once | INTRAVENOUS | Status: AC
  Administered 2018-04-14: 20 mL via INTRAVENOUS

## 2018-04-14 MED ORDER — ROSUVASTATIN CALCIUM 5 MG PO TABS
5.0000 mg | ORAL_TABLET | Freq: Every day | ORAL | Status: DC
  Administered 2018-04-14: 5 mg via ORAL
  Filled 2018-04-14: qty 1

## 2018-04-14 NOTE — Progress Notes (Signed)
Patient has declined the CPAP tonight stating that he did not bring his home machine and does not like the hospital issued machine.

## 2018-04-14 NOTE — Evaluation (Signed)
Physical Therapy Evaluation Patient Details Name: Justin [name] MRN: [id number] DOB: 05-Aug-1956 Today's Date: 04/14/2018   History of Present Illness  62 y.o. male with a history of HTN, OSA with CPAP, anxiety presented for acute right sided numbness and ataxia Consistent with Acute Ischemic Stroke. Neuro work up underway.  Clinical Impression  Orders received for PT evaluation. Patient demonstrates neurologic deficits in functional mobility as indicated below. Will benefit from continued skilled PT to address deficits and maximize function. Feel that patient would most benefit from outpatient therapies to maximize higher level functional recovery in preparation for return to work and independence. Will defer all further needs to outpatient follow up.    Follow Up Recommendations Outpatient PT    Equipment Recommendations  None recommended by PT    Recommendations for Other Services       Precautions / Restrictions Precautions Precautions: Fall Restrictions Weight Bearing Restrictions: No      Mobility  Bed Mobility Overal bed mobility: Independent             General bed mobility comments: No difficulties noted.   Transfers Overall transfer level: Needs assistance Equipment used: None Transfers: Sit to/from Stand Sit to Stand: Min guard         General transfer comment: Min guard assist due to instability on feet.   Ambulation/Gait Ambulation/Gait assistance: Independent Ambulation Distance (Feet): 310 Feet Assistive device: None Gait Pattern/deviations: Step-through pattern Gait velocity: decreased Gait velocity interpretation: 1.31 - 2.62 ft/sec, indicative of limited community ambulator General Gait Details: modest instability with basic ambulation, increased instability with higher level tasks. No overt LOB, able to self correct without difficulty  Stairs Stairs: Yes Stairs assistance: Modified independent (Device/Increase time) Stair Management: Two  rails Number of Stairs: 5 General stair comments: no difficulty  Wheelchair Mobility    Modified Rankin (Stroke Patients Only) Modified Rankin (Stroke Patients Only) Pre-Morbid Rankin Score: No symptoms Modified Rankin: Moderate disability     Balance Overall balance assessment: Needs assistance Sitting-balance support: Feet supported Sitting balance-Leahy Scale: Normal                       High level balance activites: Side stepping;Braiding;Backward walking;Direction changes;Turns;Sudden stops;Head turns High Level Balance Comments: some noted instability with head turns, braiding and speed changes Standardized Balance Assessment Standardized Balance Assessment : Dynamic Gait Index   Dynamic Gait Index Level Surface: Normal Change in Gait Speed: Mild Impairment Gait with Horizontal Head Turns: Mild Impairment Gait with Vertical Head Turns: Mild Impairment Gait and Pivot Turn: Mild Impairment Step Over Obstacle: Mild Impairment Step Around Obstacles: Moderate Impairment Steps: Mild Impairment Total Score: 16       Pertinent Vitals/Pain Pain Assessment: No/denies pain    Home Living Family/patient expects to be discharged to:: Private residence Living Arrangements: Spouse/significant other Available Help at Discharge: Family Type of Home: House Home Access: Stairs to enter Entrance Stairs-Rails: None Technical brewer of Steps: 2 Home Layout: Laundry or work area in basement(office in basement) Home Equipment: Civil engineer, contracting - built in;Hand held shower head      Prior Function Level of Independence: Independent         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 Decreased light touch and proprioception sensation. Decreased fine and gross motor  coordination.  RUE Sensation: decreased light touch;decreased proprioception RUE Coordination: decreased fine motor;decreased gross motor    Lower Extremity Assessment Lower Extremity Assessment: RLE deficits/detail RLE Deficits / Details: strength intact RLE Sensation: decreased light touch;decreased proprioception RLE Coordination: decreased fine motor;decreased gross motor       Communication   Communication: No difficulties  Cognition Arousal/Alertness: Awake/alert Behavior During Therapy: WFL for tasks assessed/performed Overall Cognitive Status: Impaired/Different from baseline Area of Impairment: Orientation;Problem solving                 Orientation Level: Disoriented to;Time           Problem Solving: Slow processing;Difficulty sequencing General Comments: Able to dual-task in hallway. Did have difficulty with serial 7's but pt reports this is baseline and he is not able to do "mental math" even at work. Pt with some difficulty with the date today.       General Comments      Exercises     Assessment/Plan    PT Assessment All further PT needs can be met in the next venue of care  PT Problem List Decreased activity tolerance;Decreased balance;Decreased mobility;Impaired sensation       PT Treatment Interventions      PT Goals (Current goals can be found in the Care Plan section)  Acute Rehab PT Goals PT Goal Formulation: All assessment and education complete, DC therapy(educated on signs of symptoms BEFAST)    Frequency     Barriers to discharge        Co-evaluation               AM-PAC PT "6 Clicks" Daily Activity  Outcome Measure Difficulty turning over in bed (including adjusting bedclothes, sheets and blankets)?: None Difficulty moving from lying on back to sitting on the side of the bed? : None Difficulty sitting down on and standing up from a chair with arms (e.g., wheelchair, bedside commode, etc,.)?: None Help needed moving  to and from a bed to chair (including a wheelchair)?: A Little Help needed walking in hospital room?: None Help needed climbing 3-5 steps with a railing? : A Little 6 Click Score: 22    End of Session   Activity Tolerance: Patient tolerated treatment well;Other (comment) Patient left: in bed;with call bell/phone within reach Nurse Communication: Mobility status PT Visit Diagnosis: Unsteadiness on feet (R26.81);Other symptoms and signs involving the nervous system (R29.898)    Time: 8403-9795 PT Time Calculation (min) (ACUTE ONLY): 18 min   Charges:   PT Evaluation $PT Eval Moderate Complexity: 1 Mod     PT G Codes:        Alben Deeds, PT DPT  Board Certified Neurologic Specialist [place] [facility] 04/14/2018, 10:35 AM

## 2018-04-14 NOTE — Consult Note (Signed)
NEURO HOSPITALIST CONSULT NOTE   Requestig physician: Dr. Karleen Hampshire  Reason for Consult: Right sided numbness and ataxia. Stroke suspected.   History obtained from:   Patient and Chart    HPI:                                                                                                                                          Justin [name] is an 62 y.o. male who presented yesterday with right hemiataxia and sensory numbness. Initial symptom was sensory numbness around his lips/mouth on the right beginning at 2:45 PM, progressing down the right side of his body to involve his RUE and RLE. He was evaluated by teleneurology and started on ASA.  CT head revealed no acute abnormality. Mild central atrophy is noted.   Past Medical History:  Diagnosis Date  . Anxiety   . Diarrhea   . Hypertension   . OSA (obstructive sleep apnea)    cpap 9 cm H2O    Past Surgical History:  Procedure Laterality Date  . BIOPSY  06/21/2017   Procedure: BIOPSY;  Surgeon: Rogene Houston, MD;  Location: [facility];  Service: Endoscopy;;  rectal, righta and left colon;  . COLONOSCOPY N/A 06/21/2017   Procedure: COLONOSCOPY;  Surgeon: Rogene Houston, MD;  Location: [facility];  Service: Endoscopy;  Laterality: N/A;  1:25  . INGUINAL HERNIA REPAIR Right 04/04/2018   Procedure: HERNIA REPAIR INGUINAL ADULT WITH MESH;  Surgeon: Aviva Signs, MD;  Location: [facility];  Service: General;  Laterality: Right;  . KNEE SURGERY     tore mcl in high school (70's)    Family History  Problem Relation Age of Onset  . Tuberculosis Mother   . Stroke Mother   . Heart disease Father        died at 13  . Bradycardia Brother        pacemaker  . Cancer Paternal Uncle   . Mental illness Paternal Grandfather        suicide at 34  . Colon cancer Neg Hx               Social History:  reports that he has never smoked. He has never used smokeless tobacco. He reports that he does not drink alcohol or use  drugs.  No Known Allergies  MEDICATIONS:  Scheduled: . aspirin  300 mg Rectal Daily   Or  . aspirin  325 mg Oral Daily  . enoxaparin (LOVENOX) injection  40 mg Subcutaneous Q24H  . insulin aspart  0-15 Units Subcutaneous TID WC  . Mesalamine  800 mg Oral BID  . rosuvastatin  5 mg Oral q1800   Continuous:    ROS:                                                                                                                                       No headache, vision changes, speech changes, chest pain or limb pain.    Blood pressure (!) 141/94, pulse 73, temperature 98.3 F (36.8 C), temperature source Oral, resp. rate 17, height 6' 1"  (1.854 m), weight 88.4 kg (194 lb 14.2 oz), SpO2 98 %.   General Examination:                                                                                                       Physical Exam  HEENT-  [place]/AT  Lungs- Respirations unlabored Extremities- No edema  Neurological Examination Mental Status: Alert, oriented, thought content appropriate.  Speech fluent without evidence of aphasia.  Able to follow all commands without difficulty. Cranial Nerves: II: Visual fields intact without extinction to DSS. PERRL  III,IV, VI: EOMI without nystagmus. No ptosis. V,VII: Smile symmetric, facial temp sensation with hyperesthesia on right VIII: hearing intact to voice IX,X: No hoarseness or hypophonia XI: Symmetric SS XII: midline tongue extension Motor: Right : Upper extremity   5/5    Left:     Upper extremity   5/5  Lower extremity   5/5     Lower extremity   5/5 Normal tone throughout; no atrophy noted Sensory: Hyperesthesia to temp RUE. FT intact x 4 without extinction Deep Tendon Reflexes: 2+ and symmetric throughout Plantars: Right: downgoing  Left: downgoing Cerebellar: Ataxic with right FNF and right H-S Gait: Deferred    Lab Results: Basic Metabolic Panel: Recent Labs  Lab 04/13/18 1618  NA 140  K 3.5  CL 103  CO2 28  GLUCOSE 97  BUN 24*  CREATININE 1.05  CALCIUM 9.7    CBC: Recent Labs  Lab 04/13/18 1618  WBC 9.6  NEUTROABS 6.9  HGB 15.1  HCT 45.1  MCV 95.8  PLT 353    Cardiac Enzymes: No results for input(s): CKTOTAL, CKMB, CKMBINDEX, TROPONINI in the last 168 hours.  Lipid Panel: Recent Labs  Lab 04/14/18  0358  CHOL 133  TRIG 75  HDL 30*  CHOLHDL 4.4  VLDL 15  LDLCALC 88    Imaging: Ct Head Code Stroke Wo Contrast  Result Date: 04/13/2018 CLINICAL DATA:  Code stroke. Right-sided numbness starting today at 3 p.m. Ataxia EXAM: CT HEAD WITHOUT CONTRAST TECHNIQUE: Contiguous axial images were obtained from the base of the skull through the vertex without intravenous contrast. COMPARISON:  None. FINDINGS: Brain: No evidence of acute infarction, hemorrhage, hydrocephalus, extra-axial collection or mass lesion/mass effect. Vascular: No hyperdense vessel.  Atherosclerotic calcification. Skull: Normal. Negative for fracture or focal lesion. Sinuses/Orbits: No acute finding. Other: These results were called by telephone at the time of interpretation on 04/13/2018 at 4:31 pm to Dr. Julianne Rice , who verbally acknowledged these results. ASPECTS [facility] Stroke Program Early CT Score) - Ganglionic level infarction (caudate, lentiform nuclei, internal capsule, insula, M1-M3 cortex): 7 - Supraganglionic infarction (M4-M6 cortex): 3 Total score (0-10 with 10 being normal): 10 IMPRESSION: No acute finding.  No evidence of infarct.  Negative for hemorrhage. Electronically Signed   By: Monte Fantasia M.D.   On: 04/13/2018 16:31    Assessment: 62 year old male with acute onset of right hemiataxia, right hyperesthesia. 1. MRI brain reveals an acute lacunar infarct in the lateral Left thalamus. This is consistent with the symptoms and Neurological exam findings.  2.  Negative intracranial MRA.  Negative Neck MRA. Aside from minimal to mild atherosclerosis at the origins of the Left ICA and right subclavian artery; no significant stenosis. 3. Echocardiogram negative for mural thrombus 4. Elevated BUN/Cr, suggestive of mild volume depletion.  5. Stroke risk factors:  HTN and OSA  Recommendations: 1. Increase ASA to 325 mg po qd.  2. Switch rosuvastatin to Lipitor 40 mg po qd. Obtain baseline CK level.  3. Encourage PO hydration 4. BP management 5. PT/OT/Speech 6. Cardiac telemetry   Electronically signed: Dr. Kerney Elbe 04/14/2018, 11:27 AM

## 2018-04-14 NOTE — Evaluation (Signed)
Occupational Therapy Evaluation Patient Details Name: Justin [name] MRN: [id number] DOB: 1956/05/23 Today's Date: 04/14/2018    History of Present Illness 62 y.o. male with a history of HTN, OSA with CPAP, anxiety presented for acute right sided numbness and ataxia Consistent with Acute Ischemic Stroke. Neuro work up underway.   Clinical Impression   PTA, pt was independent with ADL and functional mobility. He works full time as an Clinical biochemist. Pt currently requiring min guard assist for functional mobility during simulated toilet transfers and supervision during standing grooming and seated UB ADL tasks. Pt presenting with decreased light touch and proprioception sensation throughout R UE impacting his ability to participate in ADL and work tasks. Pt would best benefit from outpatient OT services post-acute D/C to maximize return to functional independence. Educated pt concerning use of visual feedback and avoiding sharp or hot objects with R UE due to increased risk of injury. He verbalizes understanding. Will continue to follow while admitted.     Follow Up Recommendations  Outpatient OT;Supervision/Assistance - 24 hour    Equipment Recommendations  3 in 1 bedside commode    Recommendations for Other Services       Precautions / Restrictions Precautions Precautions: Fall Restrictions Weight Bearing Restrictions: No      Mobility Bed Mobility Overal bed mobility: Independent             General bed mobility comments: No difficulties noted.   Transfers Overall transfer level: Needs assistance Equipment used: None Transfers: Sit to/from Stand Sit to Stand: Min guard         General transfer comment: Min guard assist due to instability on feet.     Balance Overall balance assessment: Needs assistance Sitting-balance support: Feet supported Sitting balance-Leahy Scale: Normal                       High level balance activites: Side  stepping;Braiding;Backward walking;Direction changes;Turns;Sudden stops;Head turns High Level Balance Comments: some noted instability with head turns, braiding and speed changes Standardized Balance Assessment Standardized Balance Assessment : Dynamic Gait Index   Dynamic Gait Index Level Surface: Normal Change in Gait Speed: Mild Impairment Gait with Horizontal Head Turns: Mild Impairment Gait with Vertical Head Turns: Mild Impairment Gait and Pivot Turn: Mild Impairment Step Over Obstacle: Mild Impairment Step Around Obstacles: Moderate Impairment Steps: Mild Impairment Total Score: 16     ADL either performed or assessed with clinical judgement   ADL Overall ADL's : Needs assistance/impaired Eating/Feeding: Sitting;Supervision/ safety   Grooming: Supervision/safety;Standing   Upper Body Bathing: Supervision/ safety;Sitting   Lower Body Bathing: Supervison/ safety;Sit to/from stand   Upper Body Dressing : Supervision/safety;Sitting   Lower Body Dressing: Supervision/safety;Sit to/from stand   Toilet Transfer: Min guard;Ambulation;Regular Glass blower/designer Details (indicate cue type and reason): simulated in room Toileting- Clothing Manipulation and Hygiene: Min guard;Sit to/from stand       Functional mobility during ADLs: Min guard General ADL Comments: Pt able to open toothbrush plastic cover as well as remove toothpaste lid without difficulty when visual feedback used. Difficulty with more complex fasteners.       Vision Patient Visual Report: No change from baseline Vision Assessment?: No apparent visual deficits     Perception     Praxis      Pertinent Vitals/Pain       Hand Dominance Right   Extremity/Trunk Assessment Upper Extremity Assessment Upper Extremity Assessment: RUE deficits/detail RUE Deficits / Details: Strength and ROM WFL. Decreased  light touch and proprioception sensation. Decreased fine and gross motor coordination.  RUE  Sensation: decreased light touch;decreased proprioception RUE Coordination: decreased fine motor;decreased gross motor   Lower Extremity Assessment Lower Extremity Assessment: Defer to PT evaluation RLE Deficits / Details: strength intact RLE Sensation: decreased light touch;decreased proprioception RLE Coordination: decreased fine motor;decreased gross motor       Communication Communication Communication: No difficulties   Cognition Arousal/Alertness: Awake/alert Behavior During Therapy: WFL for tasks assessed/performed Overall Cognitive Status: Impaired/Different from baseline Area of Impairment: Orientation;Problem solving                 Orientation Level: Disoriented to;Time           Problem Solving: Slow processing;Difficulty sequencing General Comments: Able to dual-task in hallway. Did have difficulty with serial 7's but pt reports this is baseline and he is not able to do "mental math" even at work. Pt with some difficulty with the date today.    General Comments  Wife present during session.     Exercises     Shoulder Instructions      Home Living Family/patient expects to be discharged to:: Private residence Living Arrangements: Spouse/significant other Available Help at Discharge: Family Type of Home: House Home Access: Stairs to enter Technical brewer of Steps: 2 Entrance Stairs-Rails: None Home Layout: Laundry or work area in basement(office in basement)     Bathroom Shower/Tub: Occupational psychologist: Standard     Home Equipment: Civil engineer, contracting - built in;Hand held shower head          Prior Functioning/Environment Level of Independence: Independent        Comments: Works as an Engineer, civil (consulting) Problem List: Decreased strength;Decreased range of motion;Decreased activity tolerance;Impaired balance (sitting and/or standing);Decreased safety awareness;Decreased knowledge of use of DME or AE;Decreased knowledge  of precautions;Pain      OT Treatment/Interventions: Self-care/ADL training;Therapeutic exercise;Energy conservation;DME and/or AE instruction;Balance training    OT Goals(Current goals can be found in the care plan section) Acute Rehab OT Goals Patient Stated Goal: back to work OT Goal Formulation: With patient/family Time For Goal Achievement: 04/28/18 Potential to Achieve Goals: Good  OT Frequency: Min 2X/week   Barriers to D/C:            Co-evaluation              AM-PAC PT "6 Clicks" Daily Activity     Outcome Measure Help from another person eating meals?: None Help from another person taking care of personal grooming?: A Little Help from another person toileting, which includes using toliet, bedpan, or urinal?: A Little Help from another person bathing (including washing, rinsing, drying)?: A Little Help from another person to put on and taking off regular upper body clothing?: A Little Help from another person to put on and taking off regular lower body clothing?: A Little 6 Click Score: 19   End of Session Equipment Utilized During Treatment: Gait belt;Rolling walker  Activity Tolerance: Patient tolerated treatment well Patient left: in bed;with call bell/phone within reach;with family/visitor present  OT Visit Diagnosis: Other abnormalities of gait and mobility (R26.89);Hemiplegia and hemiparesis Hemiplegia - Right/Left: Right Hemiplegia - dominant/non-dominant: Dominant                Time: 3244-0102 OT Time Calculation (min): 18 min Charges:  OT General Charges $OT Visit: 1 Visit OT Evaluation $OT Eval Moderate Complexity: 1 Mod G-Codes:  Norman Herrlich, MS OTR/L  Pager: [place] A Mahreen Schewe 04/14/2018, 10:54 AM

## 2018-04-14 NOTE — Progress Notes (Signed)
  Echocardiogram 2D Echocardiogram has been performed.  Justin [name] 04/14/2018, 5:09 PM

## 2018-04-14 NOTE — Progress Notes (Signed)
PROGRESS NOTE    [name]  EXN:[id number] DOB: [birth year] DOA: 04/13/2018 PCP: Justin Frizzle, MD    Brief Narrative:  Justin [name] is a 62 y.o. male with a history of HTN, OSA with CPAP, anxiety reported numbness of the right face, right upper extremity and right lower extremity since Wednesday. He was admitted for evaluation of stroke.     Assessment & Plan:   Principal Problem:   Right facial numbness Active Problems:   Hyperglycemia   Hypertension   Non-recurrent unilateral inguinal hernia without obstruction or gangrene   Right face/ upper extremity numbness: Right sided sensory deficits persistent.  CT head negative.  MRI BRAIN showed Acute lacunar infarct in the lateral Left thalamus. No associated hemorrhage or mass effect. Negative intracranial and neck  MRA. Echocardiogram and carotid duplex pending.  hgba1c is 5.2.  LDL is 88.  Continue with aspirin 325 mg daily and crestor 5 mg daily.  Neurology consulted and therapy evals pending.     Hypertension; Well controlled.     DVT prophylaxis: lovenox. ) Code Status:  Full code.  Family Communication: multiple family members at bedside.  Disposition Plan: pending neurology evaluation.    Consultants:   Neurology.    Procedures: MRI brain and MRA head and neck.   Echocardiogram.   Carotid duplex.    Antimicrobials: none.    Subjective: Persistent right sided numbness and tingling.   Objective: Vitals:   04/14/18 0145 04/14/18 0403 04/14/18 0719 04/14/18 1607  BP: 121/87 (!) 144/97 (!) 141/94 (!) 156/100  Pulse: 66 61 73 71  Resp: 18 20 17    Temp: 98.4 F (36.9 C) 98.1 F (36.7 C) 98.3 F (36.8 C) 98.2 F (36.8 C)  TempSrc: Temporal Oral Oral Oral  SpO2: 98% 98% 98% 98%  Weight:      Height:       No intake or output data in the 24 hours ending 04/14/18 1750 Filed Weights   04/13/18 1647 04/13/18 2138  Weight: 90.2 kg (198 lb 12.8 oz) 88.4 kg (194 lb 14.2 oz)     Examination:  General exam: Appears calm and comfortable  Respiratory system: Clear to auscultation. Respiratory effort normal. Cardiovascular system: S1 & S2 heard, RRR. No JVD, murmurs, rubs, gallops or clicks. No pedal edema. Gastrointestinal system: Abdomen is nondistended, soft and nontender. No organomegaly or masses felt. Normal bowel sounds heard. Central nervous system: Alert and oriented. Right arm tingling and numbness.  Extremities: Symmetric 5 x 5 power. Skin: No rashes, lesions or ulcers Psychiatry: Judgement and insight appear normal. Mood & affect appropriate.     Data Reviewed: I have personally reviewed following labs and imaging studies  CBC: Recent Labs  Lab 04/13/18 1618  WBC 9.6  NEUTROABS 6.9  HGB 15.1  HCT 45.1  MCV 95.8  PLT 496   Basic Metabolic Panel: Recent Labs  Lab 04/13/18 1618  NA 140  K 3.5  CL 103  CO2 28  GLUCOSE 97  BUN 24*  CREATININE 1.05  CALCIUM 9.7   GFR: Estimated Creatinine Clearance: 82.4 mL/min (by C-G formula based on SCr of 1.05 mg/dL). Liver Function Tests: Recent Labs  Lab 04/13/18 1618  AST 21  ALT 22  ALKPHOS 69  BILITOT 1.1  PROT 7.3  ALBUMIN 3.9   No results for input(s): LIPASE, AMYLASE in the last 168 hours. No results for input(s): AMMONIA in the last 168 hours. Coagulation Profile: Recent Labs  Lab 04/13/18 1618  INR 1.12  Cardiac Enzymes: No results for input(s): CKTOTAL, CKMB, CKMBINDEX, TROPONINI in the last 168 hours. BNP (last 3 results) No results for input(s): PROBNP in the last 8760 hours. HbA1C: Recent Labs    04/14/18 0358  HGBA1C 5.2   CBG: Recent Labs  Lab 04/14/18 0630 04/14/18 1110 04/14/18 1603  GLUCAP 102* 89 104*   Lipid Profile: Recent Labs    04/14/18 0358  CHOL 133  HDL 30*  LDLCALC 88  TRIG 75  CHOLHDL 4.4   Thyroid Function Tests: No results for input(s): TSH, T4TOTAL, FREET4, T3FREE, THYROIDAB in the last 72 hours. Anemia Panel: No results  for input(s): VITAMINB12, FOLATE, FERRITIN, TIBC, IRON, RETICCTPCT in the last 72 hours. Sepsis Labs: No results for input(s): PROCALCITON, LATICACIDVEN in the last 168 hours.  No results found for this or any previous visit (from the past 240 hour(s)).       Radiology Studies: Mr Jodene Nam Neck W Wo Contrast  Result Date: 04/14/2018 CLINICAL DATA:  62 year old male with code stroke presentation yesterday consisting of right side numbness and ataxia. EXAM: MRI HEAD WITHOUT CONTRAST MRA HEAD WITHOUT CONTRAST MRA NECK WITHOUT AND WITH CONTRAST TECHNIQUE: Multiplanar, multiecho pulse sequences of the brain and surrounding structures were obtained without intravenous contrast. Angiographic images of the Circle of Willis were obtained using MRA technique without intravenous contrast. Angiographic images of the neck were obtained using MRA technique without and with intravenous contrast. Carotid stenosis measurements (when applicable) are obtained utilizing NASCET criteria, using the distal internal carotid diameter as the denominator. CONTRAST:  59m MULTIHANCE GADOBENATE DIMEGLUMINE 529 MG/ML IV SOLN COMPARISON:  Head CT without contrast 04/13/2018. FINDINGS: MRI HEAD FINDINGS Brain: Oval 12 millimeter focus of restricted diffusion in the lateral left thalamus (series 3, image 27). Associated T2 and FLAIR hyperintensity. No associated hemorrhage or mass effect. No other restricted diffusion. The other deep gray matter nuclei appear within normal limits for age. No midline shift, mass effect, evidence of mass lesion, ventriculomegaly, extra-axial collection or acute intracranial hemorrhage. Scattered mild for age nonspecific cerebral white matter T2 and FLAIR hyperintensity, more so in the right frontal lobe. No cortical encephalomalacia identified. Cervicomedullary junction and pituitary are within normal limits. Vascular: Major intracranial vascular flow voids are preserved. Skull and upper cervical spine:  Negative visible cervical spine. Normal bone marrow signal. Sinuses/Orbits: Negative orbits soft tissues. Mild paranasal sinus mucosal thickening and occasional mucous retention cysts (left maxillary). Other: Visible internal auditory structures appear normal. Mastoid air cells are clear. Visualized scalp and face soft tissues are within normal limits. MRA NECK FINDINGS Precontrast time-of-flight imaging demonstrates antegrade flow in both cervical carotid and vertebral arteries. The carotid bifurcations appear normal. Post-contrast neck MRA images reveal a 3 vessel arch configuration. Normal great vessel origins. Normal right CCA, right carotid bifurcation, and cervical right ICA. Normal left CCA. Minimal irregularity at the left ICA origin without stenosis, otherwise normal left carotid bifurcation and cervical left ICA. Mild irregularity at the right subclavian artery origin compatible with atherosclerosis, less than 50 % stenosis with respect to the distal vessel. Normal proximal left subclavian artery. Both vertebral artery origins are normal. The vertebral arteries are codominant and normal to the skull base. MRA HEAD FINDINGS Antegrade flow in the posterior circulation. Mildly dominant distal right vertebral artery. Normal PICA origins. No distal vertebral stenosis. Patent vertebrobasilar junction and basilar artery without stenosis. Normal SCA and PCA origins. Right PCA branches are within normal limits. The left P1 segment is tortuous. The left posterior communicating artery  is present. No left PCA stenosis on the source images. Antegrade flow in both ICA siphons. No siphon stenosis. Normal ophthalmic and left posterior communicating artery origins. The right Posterior communicating arteries are diminutive or absent. Patent carotid termini. Normal MCA and ACA origins. The left A1 is dominant. Anterior communicating artery and visible ACA branches are normal. Bilateral MCA M1 segments, MCA bifurcations, and  visible MCA branches are within normal limits. IMPRESSION: 1. Acute lacunar infarct in the lateral Left thalamus. No associated hemorrhage or mass effect. 2.  Negative intracranial MRA. 3. Negative Neck MRA aside for minimal to mild atherosclerosis at the origins of the Left ICA and Right subclavian artery; no significant stenosis. 4. Mild for age underlying nonspecific cerebral white matter signal changes, most commonly due to chronic small vessel disease. Electronically Signed   By: Genevie Ann M.D.   On: 04/14/2018 14:11   Mr Brain Wo Contrast  Result Date: 04/14/2018 CLINICAL DATA:  62 year old male with code stroke presentation yesterday consisting of right side numbness and ataxia. EXAM: MRI HEAD WITHOUT CONTRAST MRA HEAD WITHOUT CONTRAST MRA NECK WITHOUT AND WITH CONTRAST TECHNIQUE: Multiplanar, multiecho pulse sequences of the brain and surrounding structures were obtained without intravenous contrast. Angiographic images of the Circle of Willis were obtained using MRA technique without intravenous contrast. Angiographic images of the neck were obtained using MRA technique without and with intravenous contrast. Carotid stenosis measurements (when applicable) are obtained utilizing NASCET criteria, using the distal internal carotid diameter as the denominator. CONTRAST:  34m MULTIHANCE GADOBENATE DIMEGLUMINE 529 MG/ML IV SOLN COMPARISON:  Head CT without contrast 04/13/2018. FINDINGS: MRI HEAD FINDINGS Brain: Oval 12 millimeter focus of restricted diffusion in the lateral left thalamus (series 3, image 27). Associated T2 and FLAIR hyperintensity. No associated hemorrhage or mass effect. No other restricted diffusion. The other deep gray matter nuclei appear within normal limits for age. No midline shift, mass effect, evidence of mass lesion, ventriculomegaly, extra-axial collection or acute intracranial hemorrhage. Scattered mild for age nonspecific cerebral white matter T2 and FLAIR hyperintensity, more so  in the right frontal lobe. No cortical encephalomalacia identified. Cervicomedullary junction and pituitary are within normal limits. Vascular: Major intracranial vascular flow voids are preserved. Skull and upper cervical spine: Negative visible cervical spine. Normal bone marrow signal. Sinuses/Orbits: Negative orbits soft tissues. Mild paranasal sinus mucosal thickening and occasional mucous retention cysts (left maxillary). Other: Visible internal auditory structures appear normal. Mastoid air cells are clear. Visualized scalp and face soft tissues are within normal limits. MRA NECK FINDINGS Precontrast time-of-flight imaging demonstrates antegrade flow in both cervical carotid and vertebral arteries. The carotid bifurcations appear normal. Post-contrast neck MRA images reveal a 3 vessel arch configuration. Normal great vessel origins. Normal right CCA, right carotid bifurcation, and cervical right ICA. Normal left CCA. Minimal irregularity at the left ICA origin without stenosis, otherwise normal left carotid bifurcation and cervical left ICA. Mild irregularity at the right subclavian artery origin compatible with atherosclerosis, less than 50 % stenosis with respect to the distal vessel. Normal proximal left subclavian artery. Both vertebral artery origins are normal. The vertebral arteries are codominant and normal to the skull base. MRA HEAD FINDINGS Antegrade flow in the posterior circulation. Mildly dominant distal right vertebral artery. Normal PICA origins. No distal vertebral stenosis. Patent vertebrobasilar junction and basilar artery without stenosis. Normal SCA and PCA origins. Right PCA branches are within normal limits. The left P1 segment is tortuous. The left posterior communicating artery is present. No left PCA stenosis  on the source images. Antegrade flow in both ICA siphons. No siphon stenosis. Normal ophthalmic and left posterior communicating artery origins. The right Posterior  communicating arteries are diminutive or absent. Patent carotid termini. Normal MCA and ACA origins. The left A1 is dominant. Anterior communicating artery and visible ACA branches are normal. Bilateral MCA M1 segments, MCA bifurcations, and visible MCA branches are within normal limits. IMPRESSION: 1. Acute lacunar infarct in the lateral Left thalamus. No associated hemorrhage or mass effect. 2.  Negative intracranial MRA. 3. Negative Neck MRA aside for minimal to mild atherosclerosis at the origins of the Left ICA and Right subclavian artery; no significant stenosis. 4. Mild for age underlying nonspecific cerebral white matter signal changes, most commonly due to chronic small vessel disease. Electronically Signed   By: Genevie Ann M.D.   On: 04/14/2018 14:11   Mr Mra Head/brain Wo Cm  Result Date: 04/14/2018 CLINICAL DATA:  62 year old male with code stroke presentation yesterday consisting of right side numbness and ataxia. EXAM: MRI HEAD WITHOUT CONTRAST MRA HEAD WITHOUT CONTRAST MRA NECK WITHOUT AND WITH CONTRAST TECHNIQUE: Multiplanar, multiecho pulse sequences of the brain and surrounding structures were obtained without intravenous contrast. Angiographic images of the Circle of Willis were obtained using MRA technique without intravenous contrast. Angiographic images of the neck were obtained using MRA technique without and with intravenous contrast. Carotid stenosis measurements (when applicable) are obtained utilizing NASCET criteria, using the distal internal carotid diameter as the denominator. CONTRAST:  78m MULTIHANCE GADOBENATE DIMEGLUMINE 529 MG/ML IV SOLN COMPARISON:  Head CT without contrast 04/13/2018. FINDINGS: MRI HEAD FINDINGS Brain: Oval 12 millimeter focus of restricted diffusion in the lateral left thalamus (series 3, image 27). Associated T2 and FLAIR hyperintensity. No associated hemorrhage or mass effect. No other restricted diffusion. The other deep gray matter nuclei appear within  normal limits for age. No midline shift, mass effect, evidence of mass lesion, ventriculomegaly, extra-axial collection or acute intracranial hemorrhage. Scattered mild for age nonspecific cerebral white matter T2 and FLAIR hyperintensity, more so in the right frontal lobe. No cortical encephalomalacia identified. Cervicomedullary junction and pituitary are within normal limits. Vascular: Major intracranial vascular flow voids are preserved. Skull and upper cervical spine: Negative visible cervical spine. Normal bone marrow signal. Sinuses/Orbits: Negative orbits soft tissues. Mild paranasal sinus mucosal thickening and occasional mucous retention cysts (left maxillary). Other: Visible internal auditory structures appear normal. Mastoid air cells are clear. Visualized scalp and face soft tissues are within normal limits. MRA NECK FINDINGS Precontrast time-of-flight imaging demonstrates antegrade flow in both cervical carotid and vertebral arteries. The carotid bifurcations appear normal. Post-contrast neck MRA images reveal a 3 vessel arch configuration. Normal great vessel origins. Normal right CCA, right carotid bifurcation, and cervical right ICA. Normal left CCA. Minimal irregularity at the left ICA origin without stenosis, otherwise normal left carotid bifurcation and cervical left ICA. Mild irregularity at the right subclavian artery origin compatible with atherosclerosis, less than 50 % stenosis with respect to the distal vessel. Normal proximal left subclavian artery. Both vertebral artery origins are normal. The vertebral arteries are codominant and normal to the skull base. MRA HEAD FINDINGS Antegrade flow in the posterior circulation. Mildly dominant distal right vertebral artery. Normal PICA origins. No distal vertebral stenosis. Patent vertebrobasilar junction and basilar artery without stenosis. Normal SCA and PCA origins. Right PCA branches are within normal limits. The left P1 segment is tortuous.  The left posterior communicating artery is present. No left PCA stenosis on the source images.  Antegrade flow in both ICA siphons. No siphon stenosis. Normal ophthalmic and left posterior communicating artery origins. The right Posterior communicating arteries are diminutive or absent. Patent carotid termini. Normal MCA and ACA origins. The left A1 is dominant. Anterior communicating artery and visible ACA branches are normal. Bilateral MCA M1 segments, MCA bifurcations, and visible MCA branches are within normal limits. IMPRESSION: 1. Acute lacunar infarct in the lateral Left thalamus. No associated hemorrhage or mass effect. 2.  Negative intracranial MRA. 3. Negative Neck MRA aside for minimal to mild atherosclerosis at the origins of the Left ICA and Right subclavian artery; no significant stenosis. 4. Mild for age underlying nonspecific cerebral white matter signal changes, most commonly due to chronic small vessel disease. Electronically Signed   By: Genevie Ann M.D.   On: 04/14/2018 14:11   Ct Head Code Stroke Wo Contrast  Result Date: 04/13/2018 CLINICAL DATA:  Code stroke. Right-sided numbness starting today at 3 p.m. Ataxia EXAM: CT HEAD WITHOUT CONTRAST TECHNIQUE: Contiguous axial images were obtained from the base of the skull through the vertex without intravenous contrast. COMPARISON:  None. FINDINGS: Brain: No evidence of acute infarction, hemorrhage, hydrocephalus, extra-axial collection or mass lesion/mass effect. Vascular: No hyperdense vessel.  Atherosclerotic calcification. Skull: Normal. Negative for fracture or focal lesion. Sinuses/Orbits: No acute finding. Other: These results were called by telephone at the time of interpretation on 04/13/2018 at 4:31 pm to Dr. Julianne Rice , who verbally acknowledged these results. ASPECTS [facility] Stroke Program Early CT Score) - Ganglionic level infarction (caudate, lentiform nuclei, internal capsule, insula, M1-M3 cortex): 7 - Supraganglionic infarction  (M4-M6 cortex): 3 Total score (0-10 with 10 being normal): 10 IMPRESSION: No acute finding.  No evidence of infarct.  Negative for hemorrhage. Electronically Signed   By: Monte Fantasia M.D.   On: 04/13/2018 16:31        Scheduled Meds: . aspirin  300 mg Rectal Daily   Or  . aspirin  325 mg Oral Daily  . enoxaparin (LOVENOX) injection  40 mg Subcutaneous Q24H  . insulin aspart  0-15 Units Subcutaneous TID WC  . Mesalamine  800 mg Oral BID   Continuous Infusions:   LOS: 0 days    Time spent: 30 minutes.     Hosie Poisson, MD [facility] Pager [phone]  If 7PM-7AM, please contact night-coverage [email or web address] Password [email or web address] 04/14/2018, 5:50 PM

## 2018-04-15 DIAGNOSIS — I6340 Cerebral infarction due to embolism of unspecified cerebral artery: Secondary | ICD-10-CM

## 2018-04-15 HISTORY — DX: Cerebral infarction due to embolism of unspecified cerebral artery: I63.40

## 2018-04-15 LAB — GLUCOSE, CAPILLARY
GLUCOSE-CAPILLARY: 89 mg/dL (ref 65–99)
GLUCOSE-CAPILLARY: 86 mg/dL (ref 65–99)

## 2018-04-15 LAB — CK: Total CK: 73 U/L (ref 49–397)

## 2018-04-15 MED ORDER — ASPIRIN 325 MG PO TABS: 325.0000 mg | ORAL_TABLET | Freq: Every day | ORAL | 0 refills | Status: DC

## 2018-04-15 MED ORDER — ATORVASTATIN CALCIUM 40 MG PO TABS: 40.0000 mg | ORAL_TABLET | Freq: Every day | ORAL | Status: DC

## 2018-04-15 MED ORDER — ATORVASTATIN CALCIUM 40 MG PO TABS: 40.0000 mg | ORAL_TABLET | Freq: Every day | ORAL | 0 refills | Status: DC

## 2018-04-15 NOTE — Progress Notes (Signed)
D/C instructions provided to patient and spouse, denies questions/concerns at this time. Patient transported to front entrance via WC, tol well.

## 2018-04-15 NOTE — Progress Notes (Signed)
STROKE TEAM PROGRESS NOTE   HISTORY OF PRESENT ILLNESS (per record) Justin [name] is an 62 y.o. male who presented yesterday with right hemiataxia and sensory numbness. Initial symptom was sensory numbness around his lips/mouth on the right beginning at 2:45 PM, progressing down the right side of his body to involve his RUE and RLE. He was evaluated by teleneurology and started on ASA.  CT head revealed no acute abnormality. Mild central atrophy is noted.      SUBJECTIVE (INTERVAL HISTORY) His wife is at the bedside.  His has some residual numbness and tingling in the right face and right hand/arm and right proximal leg.  It has improved since onset.  He was taking ASA daily at home until a few days before hip surgery when it was stopped.  Post-op he re-started after a week, but then had the stroke.   MRI - left thalamic acute infarct. MRA Brain and Neck - negative.    OBJECTIVE Temp:  [98.2 F (36.8 C)-98.7 F (37.1 C)] 98.4 F (36.9 C) (06/09 0756) Pulse Rate:  [63-71] 63 (06/09 0756) Cardiac Rhythm: Normal sinus rhythm (06/09 0745) Resp:  [16-18] 16 (06/09 0756) BP: (140-156)/(88-104) 153/88 (06/09 0756) SpO2:  [97 %-99 %] 98 % (06/09 0756)  CBC:  Recent Labs  Lab 04/13/18 1618  WBC 9.6  NEUTROABS 6.9  HGB 15.1  HCT 45.1  MCV 95.8  PLT 224    Basic Metabolic Panel:  Recent Labs  Lab 04/13/18 1618  NA 140  K 3.5  CL 103  CO2 28  GLUCOSE 97  BUN 24*  CREATININE 1.05  CALCIUM 9.7    Lipid Panel:     Component Value Date/Time   CHOL 133 04/14/2018 0358   TRIG 75 04/14/2018 0358   HDL 30 (L) 04/14/2018 0358   CHOLHDL 4.4 04/14/2018 0358   VLDL 15 04/14/2018 0358   LDLCALC 88 04/14/2018 0358   HgbA1c:  Lab Results  Component Value Date   HGBA1C 5.2 04/14/2018   Urine Drug Screen:     Component Value Date/Time   LABOPIA NONE DETECTED 04/13/2018 1817   COCAINSCRNUR NONE DETECTED 04/13/2018 1817   LABBENZ NONE DETECTED 04/13/2018 1817   AMPHETMU  NONE DETECTED 04/13/2018 1817   THCU NONE DETECTED 04/13/2018 1817   LABBARB NONE DETECTED 04/13/2018 1817    Alcohol Level     Component Value Date/Time   ETH <10 04/13/2018 1618    IMAGING  Mr Brain Wo Contrast Mr Jodene Nam Neck W Wo Contrast 04/14/2018 IMPRESSION:  1. Acute lacunar infarct in the lateral Left thalamus. No associated hemorrhage or mass effect.  2. Negative intracranial MRA.  3. Negative Neck MRA aside for minimal to mild atherosclerosis at the origins of the Left ICA and Right subclavian artery; no significant stenosis.  4. Mild for age underlying nonspecific cerebral white matter signal changes, most commonly due to chronic small vessel disease.    Ct Head Code Stroke Wo Contrast 04/13/2018 IMPRESSION: No acute finding.  No evidence of infarct.  Negative for hemorrhage.    Transthoracic Echocardiogram  04/14/2018 Study Conclusions  - Left ventricle: The cavity size was normal. There was mild   concentric hypertrophy. Systolic function was normal. The   estimated ejection fraction was in the range of 55% to 60%. Wall   motion was normal; there were no regional wall motion   abnormalities. Doppler parameters are consistent with abnormal   left ventricular relaxation (grade 1 diastolic dysfunction). - Left atrium: The  atrium was mildly dilated.   PHYSICAL EXAM Vitals:   04/14/18 2005 04/15/18 0020 04/15/18 0456 04/15/18 0756  BP: (!) 155/104 (!) 140/94 (!) 142/94 (!) 153/88  Pulse: 71 68 71 63  Resp:  16 18 16   Temp: 98.7 F (37.1 C) 98.7 F (37.1 C) 98.4 F (36.9 C) 98.4 F (36.9 C)  TempSrc: Oral Oral Oral Oral  SpO2: 98% 97% 99% 98%  Weight:      Height:        Awake, alert, fully oriented.  Language-fluent, no dysarthria. Comprehension, naming, repetition - intact. Face symmetrical.  Tongue midline. EOMI.  Strength 5/5 BUE and BLE. Sensory - decreased pinprick and light touch on the right face, arm, and leg. Coord - intact bilaterally. Gait -  normal.     ASSESSMENT/PLAN [name] is a 62 y.o. male with history of obstructive sleep apnea, hypertension, and anxiety presenting with right hemi-ataxia and sensory numbness on the right. He did not receive IV t-PA due to mild deficits.  Acute left thalamic infarct, possibly related to rebound hypercoagulability secondary to ASA cessation.  His BP is well controlled.  His LDL is 88 and is on statin.  His A1c was 5.2.  He is a non-smoker.  He can be discharged on ASA 81 mg qd.  If he has another event, then needs to be changed to Plavix.   Rogue Jury, MS, MD  Stroke:  Acute lacunar infarct in the lateral Left thalamus  Resultant  Right sided numbness  CT head - No acute finding  MRI head - Acute lacunar infarct in the lateral Left thalamus  MRA head - negative  MRA Neck - mild disease  Carotid Doppler - MRA neck  2D Echo - EF - 55% to 60%. The Lt atrium was mildly dilated.  LDL - 88  HgbA1c - 5.2  VTE prophylaxis - Lovenox Diet Order           Diet heart healthy/carb modified Room service appropriate? Yes; Fluid consistency: Thin  Diet effective now          aspirin 81 mg daily prior to admission, now on aspirin 325 mg daily  Patient counseled to be compliant with his antithrombotic medications  Ongoing aggressive stroke risk factor management  Therapy recommendations:  Outpt PT and OT  Disposition:  Pending  Hypertension  Stable . Permissive hypertension (OK if < 220/120) but gradually normalize in 5-7 days .  Long-term BP goal normotensive  Hyperlipidemia  Lipid lowering medication PTA:  none  LDL 88, goal < 70  Current lipid lowering medication: Crestor 5 mg daily  Continue statin at discharge  Diabetes  HgbA1c 5.2, goal < 7.0  Controlled  Other Stroke Risk Factors  Advanced age  Family hx stroke (mother)  OSA   Other Active Problems  BUN 24   Plan / Recommendations   Stroke workup: awaiting - workup  complete  Therapy Follow Up: Outpt PT and OT  Disposition: Plan is for discharge today.  Antiplatelet / Anticoagulation: pending  Statin: Continue Crestor 5 mg daily.  MD Follow Up: [facility] in 6-8 weeks  Other: pending  Further risk factor modification per primary care MD: Follow Up 2 weeks   Hospital day # 1    To contact Stroke Continuity provider, please refer to [email or web address] After hours, contact General Neurology

## 2018-04-16 NOTE — Discharge Summary (Addendum)
Physician Discharge Summary  Justin [name] JQB:[id number] DOB: September 10, 1956 DOA: 04/13/2018  PCP: [name], MD  Admit date: 04/13/2018 Discharge date: 04/15/2018  Admitted From: Home.  Disposition: Home.   Recommendations for Outpatient Follow-up:  1. Follow up with PCP in 1-2 weeks 2. Please obtain BMP/CBC in one week Please follow up with neurology as recommended.   Discharge Condition:stable.  CODE STATUS: full code.  Diet recommendation: Heart Healthy   Brief/Interim Summary: Justin [name] a 62 y.o.malewith a history of HTN, OSA with CPAP, anxiety reported numbness of the right face, right upper extremity and right lower extremity since Wednesday. He was admitted for evaluation of stroke.     Discharge Diagnoses:  Principal Problem:   Right facial numbness Active Problems:   Hyperglycemia   Hypertension   Non-recurrent unilateral inguinal hernia without obstruction or gangrene   Cerebral embolism with cerebral infarction  Right face/ upper extremity numbness: Right sided sensory deficits persistent.  CT head negative.  MRI BRAIN showed Acute lacunar infarct in the lateral Left thalamus. No associated hemorrhage or mass effect.Negative intracranial and neck  MRA. Echocardiogram not significant for cardiac source of thrombus or embolism.  hgba1c is 5.2.  LDL is 88.  Continue with aspirin 325 mg daily and statin on discharge.  Neurology consulted and therapy evals  Recommending outpatient physical and occupational therapy.     Hypertension; Well controlled.    Discharge Instructions  Discharge Instructions    Ambulatory referral to Occupational Therapy   Complete by:  As directed    Diet - low sodium heart healthy   Complete by:  As directed    Discharge instructions   Complete by:  As directed    Follow up with neurology in 4 to 6 weeks as recommended.     Allergies as of 04/15/2018   No Known Allergies     Medication List    STOP taking  these medications   ALEVE 220 MG tablet Generic drug:  naproxen sodium   aspirin EC 81 MG tablet Replaced by:  aspirin 325 MG tablet     TAKE these medications   aspirin 325 MG tablet Take 1 tablet (325 mg total) by mouth daily. Replaces:  aspirin EC 81 MG tablet   atorvastatin 40 MG tablet Commonly known as:  LIPITOR Take 1 tablet (40 mg total) by mouth daily at 6 PM.   BIOFREEZE ROLL-ON EX Apply 1 application topically daily as needed (for knee pain).   DELZICOL 400 MG Cpdr DR capsule Generic drug:  Mesalamine Take 800 mg by mouth 2 (two) times daily.   losartan 100 MG tablet Commonly known as:  COZAAR TAKE ONE (1) TABLET BY MOUTH EVERY DAY   vitamin C 500 MG tablet Commonly known as:  ASCORBIC ACID Take 500 mg by mouth daily.   Vitamin D3 2000 units Tabs Take 2,000 Units by mouth daily.      Follow-up Information    [name], MD. Schedule an appointment as soon as possible for a visit in 1 week(s).   Specialty:  Family Medicine Contact information: [address] [place] [address] [phone]        Rosalin Hawking, MD. Schedule an appointment as soon as possible for a visit in 4 week(s).   Specialty:  Neurology Contact information: [address] [place] [address] [place] [facility].   Why:  For outpatient occupational therapy. Referral made through [facility] information: [address] [place]         No Known Allergies  Consultations:  Neurology.    Procedures/Studies: Mr Jodene Nam Neck W Wo Contrast  Result Date: 04/14/2018 CLINICAL DATA:  62 year old male with code stroke presentation yesterday consisting of right side numbness and ataxia. EXAM: MRI HEAD WITHOUT CONTRAST MRA HEAD WITHOUT CONTRAST MRA NECK WITHOUT AND WITH CONTRAST TECHNIQUE: Multiplanar, multiecho pulse sequences of the brain and  surrounding structures were obtained without intravenous contrast. Angiographic images of the Circle of Willis were obtained using MRA technique without intravenous contrast. Angiographic images of the neck were obtained using MRA technique without and with intravenous contrast. Carotid stenosis measurements (when applicable) are obtained utilizing NASCET criteria, using the distal internal carotid diameter as the denominator. CONTRAST:  51m MULTIHANCE GADOBENATE DIMEGLUMINE 529 MG/ML IV SOLN COMPARISON:  Head CT without contrast 04/13/2018. FINDINGS: MRI HEAD FINDINGS Brain: Oval 12 millimeter focus of restricted diffusion in the lateral left thalamus (series 3, image 27). Associated T2 and FLAIR hyperintensity. No associated hemorrhage or mass effect. No other restricted diffusion. The other deep gray matter nuclei appear within normal limits for age. No midline shift, mass effect, evidence of mass lesion, ventriculomegaly, extra-axial collection or acute intracranial hemorrhage. Scattered mild for age nonspecific cerebral white matter T2 and FLAIR hyperintensity, more so in the right frontal lobe. No cortical encephalomalacia identified. Cervicomedullary junction and pituitary are within normal limits. Vascular: Major intracranial vascular flow voids are preserved. Skull and upper cervical spine: Negative visible cervical spine. Normal bone marrow signal. Sinuses/Orbits: Negative orbits soft tissues. Mild paranasal sinus mucosal thickening and occasional mucous retention cysts (left maxillary). Other: Visible internal auditory structures appear normal. Mastoid air cells are clear. Visualized scalp and face soft tissues are within normal limits. MRA NECK FINDINGS Precontrast time-of-flight imaging demonstrates antegrade flow in both cervical carotid and vertebral arteries. The carotid bifurcations appear normal. Post-contrast neck MRA images reveal a 3 vessel arch configuration. Normal great vessel origins.  Normal right CCA, right carotid bifurcation, and cervical right ICA. Normal left CCA. Minimal irregularity at the left ICA origin without stenosis, otherwise normal left carotid bifurcation and cervical left ICA. Mild irregularity at the right subclavian artery origin compatible with atherosclerosis, less than 50 % stenosis with respect to the distal vessel. Normal proximal left subclavian artery. Both vertebral artery origins are normal. The vertebral arteries are codominant and normal to the skull base. MRA HEAD FINDINGS Antegrade flow in the posterior circulation. Mildly dominant distal right vertebral artery. Normal PICA origins. No distal vertebral stenosis. Patent vertebrobasilar junction and basilar artery without stenosis. Normal SCA and PCA origins. Right PCA branches are within normal limits. The left P1 segment is tortuous. The left posterior communicating artery is present. No left PCA stenosis on the source images. Antegrade flow in both ICA siphons. No siphon stenosis. Normal ophthalmic and left posterior communicating artery origins. The right Posterior communicating arteries are diminutive or absent. Patent carotid termini. Normal MCA and ACA origins. The left A1 is dominant. Anterior communicating artery and visible ACA branches are normal. Bilateral MCA M1 segments, MCA bifurcations, and visible MCA branches are within normal limits. IMPRESSION: 1. Acute lacunar infarct in the lateral Left thalamus. No associated hemorrhage or mass effect. 2.  Negative intracranial MRA. 3. Negative Neck MRA aside for minimal to mild atherosclerosis at the origins of the Left ICA and Right subclavian artery; no significant stenosis. 4.  Mild for age underlying nonspecific cerebral white matter signal changes, most commonly due to chronic small vessel disease. Electronically Signed   By: Genevie Ann M.D.   On: 04/14/2018 14:11   Mr Brain Wo Contrast  Result Date: 04/14/2018 CLINICAL DATA:  62 year old male with code  stroke presentation yesterday consisting of right side numbness and ataxia. EXAM: MRI HEAD WITHOUT CONTRAST MRA HEAD WITHOUT CONTRAST MRA NECK WITHOUT AND WITH CONTRAST TECHNIQUE: Multiplanar, multiecho pulse sequences of the brain and surrounding structures were obtained without intravenous contrast. Angiographic images of the Circle of Willis were obtained using MRA technique without intravenous contrast. Angiographic images of the neck were obtained using MRA technique without and with intravenous contrast. Carotid stenosis measurements (when applicable) are obtained utilizing NASCET criteria, using the distal internal carotid diameter as the denominator. CONTRAST:  59m MULTIHANCE GADOBENATE DIMEGLUMINE 529 MG/ML IV SOLN COMPARISON:  Head CT without contrast 04/13/2018. FINDINGS: MRI HEAD FINDINGS Brain: Oval 12 millimeter focus of restricted diffusion in the lateral left thalamus (series 3, image 27). Associated T2 and FLAIR hyperintensity. No associated hemorrhage or mass effect. No other restricted diffusion. The other deep gray matter nuclei appear within normal limits for age. No midline shift, mass effect, evidence of mass lesion, ventriculomegaly, extra-axial collection or acute intracranial hemorrhage. Scattered mild for age nonspecific cerebral white matter T2 and FLAIR hyperintensity, more so in the right frontal lobe. No cortical encephalomalacia identified. Cervicomedullary junction and pituitary are within normal limits. Vascular: Major intracranial vascular flow voids are preserved. Skull and upper cervical spine: Negative visible cervical spine. Normal bone marrow signal. Sinuses/Orbits: Negative orbits soft tissues. Mild paranasal sinus mucosal thickening and occasional mucous retention cysts (left maxillary). Other: Visible internal auditory structures appear normal. Mastoid air cells are clear. Visualized scalp and face soft tissues are within normal limits. MRA NECK FINDINGS Precontrast  time-of-flight imaging demonstrates antegrade flow in both cervical carotid and vertebral arteries. The carotid bifurcations appear normal. Post-contrast neck MRA images reveal a 3 vessel arch configuration. Normal great vessel origins. Normal right CCA, right carotid bifurcation, and cervical right ICA. Normal left CCA. Minimal irregularity at the left ICA origin without stenosis, otherwise normal left carotid bifurcation and cervical left ICA. Mild irregularity at the right subclavian artery origin compatible with atherosclerosis, less than 50 % stenosis with respect to the distal vessel. Normal proximal left subclavian artery. Both vertebral artery origins are normal. The vertebral arteries are codominant and normal to the skull base. MRA HEAD FINDINGS Antegrade flow in the posterior circulation. Mildly dominant distal right vertebral artery. Normal PICA origins. No distal vertebral stenosis. Patent vertebrobasilar junction and basilar artery without stenosis. Normal SCA and PCA origins. Right PCA branches are within normal limits. The left P1 segment is tortuous. The left posterior communicating artery is present. No left PCA stenosis on the source images. Antegrade flow in both ICA siphons. No siphon stenosis. Normal ophthalmic and left posterior communicating artery origins. The right Posterior communicating arteries are diminutive or absent. Patent carotid termini. Normal MCA and ACA origins. The left A1 is dominant. Anterior communicating artery and visible ACA branches are normal. Bilateral MCA M1 segments, MCA bifurcations, and visible MCA branches are within normal limits. IMPRESSION: 1. Acute lacunar infarct in the lateral Left thalamus. No associated hemorrhage or mass effect. 2.  Negative intracranial MRA. 3. Negative Neck MRA aside for minimal to mild atherosclerosis at the origins of the Left ICA and Right subclavian artery; no significant stenosis. 4. Mild for age underlying nonspecific cerebral  white matter signal changes, most commonly due to chronic small vessel disease. Electronically Signed   By: Genevie Ann M.D.   On: 04/14/2018 14:11   Mr Mra Head/brain Wo Cm  Result Date: 04/14/2018 CLINICAL DATA:  62 year old male with code stroke presentation yesterday consisting of right side numbness and ataxia. EXAM: MRI HEAD WITHOUT CONTRAST MRA HEAD WITHOUT CONTRAST MRA NECK WITHOUT AND WITH CONTRAST TECHNIQUE: Multiplanar, multiecho pulse sequences of the brain and surrounding structures were obtained without intravenous contrast. Angiographic images of the Circle of Willis were obtained using MRA technique without intravenous contrast. Angiographic images of the neck were obtained using MRA technique without and with intravenous contrast. Carotid stenosis measurements (when applicable) are obtained utilizing NASCET criteria, using the distal internal carotid diameter as the denominator. CONTRAST:  85m MULTIHANCE GADOBENATE DIMEGLUMINE 529 MG/ML IV SOLN COMPARISON:  Head CT without contrast 04/13/2018. FINDINGS: MRI HEAD FINDINGS Brain: Oval 12 millimeter focus of restricted diffusion in the lateral left thalamus (series 3, image 27). Associated T2 and FLAIR hyperintensity. No associated hemorrhage or mass effect. No other restricted diffusion. The other deep gray matter nuclei appear within normal limits for age. No midline shift, mass effect, evidence of mass lesion, ventriculomegaly, extra-axial collection or acute intracranial hemorrhage. Scattered mild for age nonspecific cerebral white matter T2 and FLAIR hyperintensity, more so in the right frontal lobe. No cortical encephalomalacia identified. Cervicomedullary junction and pituitary are within normal limits. Vascular: Major intracranial vascular flow voids are preserved. Skull and upper cervical spine: Negative visible cervical spine. Normal bone marrow signal. Sinuses/Orbits: Negative orbits soft tissues. Mild paranasal sinus mucosal thickening and  occasional mucous retention cysts (left maxillary). Other: Visible internal auditory structures appear normal. Mastoid air cells are clear. Visualized scalp and face soft tissues are within normal limits. MRA NECK FINDINGS Precontrast time-of-flight imaging demonstrates antegrade flow in both cervical carotid and vertebral arteries. The carotid bifurcations appear normal. Post-contrast neck MRA images reveal a 3 vessel arch configuration. Normal great vessel origins. Normal right CCA, right carotid bifurcation, and cervical right ICA. Normal left CCA. Minimal irregularity at the left ICA origin without stenosis, otherwise normal left carotid bifurcation and cervical left ICA. Mild irregularity at the right subclavian artery origin compatible with atherosclerosis, less than 50 % stenosis with respect to the distal vessel. Normal proximal left subclavian artery. Both vertebral artery origins are normal. The vertebral arteries are codominant and normal to the skull base. MRA HEAD FINDINGS Antegrade flow in the posterior circulation. Mildly dominant distal right vertebral artery. Normal PICA origins. No distal vertebral stenosis. Patent vertebrobasilar junction and basilar artery without stenosis. Normal SCA and PCA origins. Right PCA branches are within normal limits. The left P1 segment is tortuous. The left posterior communicating artery is present. No left PCA stenosis on the source images. Antegrade flow in both ICA siphons. No siphon stenosis. Normal ophthalmic and left posterior communicating artery origins. The right Posterior communicating arteries are diminutive or absent. Patent carotid termini. Normal MCA and ACA origins. The left A1 is dominant. Anterior communicating artery and visible ACA branches are normal. Bilateral MCA M1 segments, MCA bifurcations, and visible MCA branches are within normal limits. IMPRESSION: 1. Acute lacunar infarct in the lateral Left thalamus. No associated hemorrhage or mass  effect. 2.  Negative intracranial MRA. 3. Negative Neck MRA aside for minimal to mild atherosclerosis at the origins of the Left ICA and Right subclavian artery; no significant stenosis. 4. Mild for age underlying nonspecific cerebral white matter signal changes, most  commonly due to chronic small vessel disease. Electronically Signed   By: Genevie Ann M.D.   On: 04/14/2018 14:11   Ct Head Code Stroke Wo Contrast  Result Date: 04/13/2018 CLINICAL DATA:  Code stroke. Right-sided numbness starting today at 3 p.m. Ataxia EXAM: CT HEAD WITHOUT CONTRAST TECHNIQUE: Contiguous axial images were obtained from the base of the skull through the vertex without intravenous contrast. COMPARISON:  None. FINDINGS: Brain: No evidence of acute infarction, hemorrhage, hydrocephalus, extra-axial collection or mass lesion/mass effect. Vascular: No hyperdense vessel.  Atherosclerotic calcification. Skull: Normal. Negative for fracture or focal lesion. Sinuses/Orbits: No acute finding. Other: These results were called by telephone at the time of interpretation on 04/13/2018 at 4:31 pm to Dr. Julianne Rice , who verbally acknowledged these results. ASPECTS [facility] Stroke Program Early CT Score) - Ganglionic level infarction (caudate, lentiform nuclei, internal capsule, insula, M1-M3 cortex): 7 - Supraganglionic infarction (M4-M6 cortex): 3 Total score (0-10 with 10 being normal): 10 IMPRESSION: No acute finding.  No evidence of infarct.  Negative for hemorrhage. Electronically Signed   By: Monte Fantasia M.D.   On: 04/13/2018 16:31    ECHOCARDIOGRAM.    Subjective: No chest pain or sob.   Discharge Exam: Vitals:   04/15/18 0756 04/15/18 1244  BP: (!) 153/88 137/81  Pulse: 63 72  Resp: 16 18  Temp: 98.4 F (36.9 C) 98.5 F (36.9 C)  SpO2: 98% 98%   Vitals:   04/15/18 0020 04/15/18 0456 04/15/18 0756 04/15/18 1244  BP: (!) 140/94 (!) 142/94 (!) 153/88 137/81  Pulse: 68 71 63 72  Resp: 16 18 16 18   Temp: 98.7 F  (37.1 C) 98.4 F (36.9 C) 98.4 F (36.9 C) 98.5 F (36.9 C)  TempSrc: Oral Oral Oral Oral  SpO2: 97% 99% 98% 98%  Weight:      Height:        General: Pt is alert, awake, not in acute distress Cardiovascular: RRR, S1/S2 +, no rubs, no gallops Respiratory: CTA bilaterally, no wheezing, no rhonchi Abdominal: Soft, NT, ND, bowel sounds + Extremities: no edema, no cyanosis    The results of significant diagnostics from this hospitalization (including imaging, microbiology, ancillary and laboratory) are listed below for reference.     Microbiology: No results found for this or any previous visit (from the past 240 hour(s)).   Labs: BNP (last 3 results) No results for input(s): BNP in the last 8760 hours. Basic Metabolic Panel: Recent Labs  Lab 04/13/18 1618  NA 140  K 3.5  CL 103  CO2 28  GLUCOSE 97  BUN 24*  CREATININE 1.05  CALCIUM 9.7   Liver Function Tests: Recent Labs  Lab 04/13/18 1618  AST 21  ALT 22  ALKPHOS 69  BILITOT 1.1  PROT 7.3  ALBUMIN 3.9   No results for input(s): LIPASE, AMYLASE in the last 168 hours. No results for input(s): AMMONIA in the last 168 hours. CBC: Recent Labs  Lab 04/13/18 1618  WBC 9.6  NEUTROABS 6.9  HGB 15.1  HCT 45.1  MCV 95.8  PLT 353   Cardiac Enzymes: Recent Labs  Lab 04/15/18 1343  CKTOTAL 73   BNP: Invalid input(s): POCBNP CBG: Recent Labs  Lab 04/14/18 1110 04/14/18 1603 04/14/18 2135 04/15/18 0655 04/15/18 1130  GLUCAP 89 104* 107* 89 86   D-Dimer No results for input(s): DDIMER in the last 72 hours. Hgb A1c Recent Labs    04/14/18 0358  HGBA1C 5.2   Lipid Profile Recent  Labs    04/14/18 0358  CHOL 133  HDL 30*  LDLCALC 88  TRIG 75  CHOLHDL 4.4   Thyroid function studies No results for input(s): TSH, T4TOTAL, T3FREE, THYROIDAB in the last 72 hours.  Invalid input(s): FREET3 Anemia work up No results for input(s): VITAMINB12, FOLATE, FERRITIN, TIBC, IRON, RETICCTPCT in the  last 72 hours. Urinalysis    Component Value Date/Time   COLORURINE YELLOW 04/13/2018 1817   APPEARANCEUR CLEAR 04/13/2018 1817   LABSPEC 1.015 04/13/2018 1817   PHURINE 7.0 04/13/2018 1817   GLUCOSEU NEGATIVE 04/13/2018 1817   HGBUR NEGATIVE 04/13/2018 1817   BILIRUBINUR NEGATIVE 04/13/2018 1817   KETONESUR NEGATIVE 04/13/2018 1817   PROTEINUR NEGATIVE 04/13/2018 1817   NITRITE NEGATIVE 04/13/2018 1817   LEUKOCYTESUR NEGATIVE 04/13/2018 1817   Sepsis Labs Invalid input(s): PROCALCITONIN,  WBC,  LACTICIDVEN Microbiology No results found for this or any previous visit (from the past 240 hour(s)).   Time coordinating discharge: 35 minutes  SIGNED:   Hosie Poisson, MD  [facility] 04/16/2018, 7:59 AM Pager   If 7PM-7AM, please contact night-coverage [email or web address] Password [email or web address]

## 2018-04-19 ENCOUNTER — Encounter: Payer: Self-pay | Admitting: Family Medicine

## 2018-04-19 ENCOUNTER — Ambulatory Visit (INDEPENDENT_AMBULATORY_CARE_PROVIDER_SITE_OTHER): Payer: BC Managed Care – PPO | Admitting: Family Medicine

## 2018-04-19 ENCOUNTER — Telehealth: Payer: Self-pay | Admitting: Family Medicine

## 2018-04-19 VITALS — BP 150/100 | HR 78 | Temp 98.0°F | Resp 14 | Ht 73.0 in | Wt 193.0 lb

## 2018-04-19 DIAGNOSIS — I10 Essential (primary) hypertension: Secondary | ICD-10-CM

## 2018-04-19 DIAGNOSIS — Z09 Encounter for follow-up examination after completed treatment for conditions other than malignant neoplasm: Secondary | ICD-10-CM

## 2018-04-19 DIAGNOSIS — R200 Anesthesia of skin: Secondary | ICD-10-CM

## 2018-04-19 DIAGNOSIS — I6381 Other cerebral infarction due to occlusion or stenosis of small artery: Secondary | ICD-10-CM | POA: Diagnosis not present

## 2018-04-19 MED ORDER — DICLOFENAC SODIUM 1 % TD GEL: 2.0000 g | Freq: Four times a day (QID) | TRANSDERMAL | 1 refills | Status: DC

## 2018-04-19 NOTE — Progress Notes (Signed)
Subjective:    Patient ID: Justin [name], male    DOB: 31-Aug-1956, 62 y.o.   MRN: [id number]  HPI Unfortunately, the patient recently suffered a stroke and was admitted to the hospital.  I have copied relevant portions of the discharge summary below and included them for my reference.:  Admit date: 04/13/2018 Discharge date: 04/15/2018 Recommendations for Outpatient Follow-up:  1. Follow up with PCP in 1-2 weeks 2. Please obtain BMP/CBC in one week Please follow up with neurology as recommended.   Brief/Interim Summary: Justin [name] a 62 y.o.malewith a history of HTN, OSA with CPAP, anxietyreported numbness of the right face, right upper extremity and right lower extremity since Wednesday. He was admitted for evaluation of stroke.  Discharge Diagnoses:  Principal Problem:   Right facial numbness Active Problems:   Hyperglycemia   Hypertension   Non-recurrent unilateral inguinal hernia without obstruction or gangrene   Cerebral embolism with cerebral infarction  Right face/ upper extremity numbness: Right sided sensory deficits persistent.  CT head negative.  MRI BRAIN showedAcute lacunar infarct in the lateral Left thalamus. No associated hemorrhage or mass effect.Negative intracranialand neckMRA. Echocardiogram not significant for cardiac source of thrombus or embolism.  hgba1c is 5.2.  LDL is 88.  Continue with aspirin 325 mg daily and statin on discharge.  Neurology consulted and therapy evals  Recommending outpatient physical and occupational therapy.  + Medication List    STOP taking these medications   ALEVE 220 MG tablet Generic drug:  naproxen sodium   aspirin EC 81 MG tablet Replaced by:  aspirin 325 MG tablet     TAKE these medications   aspirin 325 MG tablet Take 1 tablet (325 mg total) by mouth daily. Replaces:  aspirin EC 81 MG tablet   atorvastatin 40 MG tablet Commonly known as:  LIPITOR Take 1 tablet (40 mg total) by mouth daily  at 6 PM.   BIOFREEZE ROLL-ON EX Apply 1 application topically daily as needed (for knee pain).   DELZICOL 400 MG Cpdr DR capsule Generic drug:  Mesalamine Take 800 mg by mouth 2 (two) times daily.   losartan 100 MG tablet Commonly known as:  COZAAR TAKE ONE (1) TABLET BY MOUTH EVERY DAY   vitamin C 500 MG tablet Commonly known as:  ASCORBIC ACID Take 500 mg by mouth daily.   Vitamin D3 2000 units Tabs Take 2,000 Units by mouth daily.    Patient is here today for follow-up.  As stated above, he suffered a lacunar infarct in the left thalamus.  He continues to have residual right facial numbness and right arm numbness.  The numbness in his right leg is improving slightly.  He has no weakness in his extremities.  He has no a aphasia or word finding difficulties or memory loss.  He is currently taking aspirin 325 mg a day, Lipitor 40 mg a day, and losartan 100 mg a day.  His blood pressure here today is elevated 150/100 however he is been monitoring his blood pressure at home and his blood pressure today at home was 142/88.  He is received similar readings throughout the last week.  He has had no further strokelike symptoms or TIA symptoms.  Overall he is doing well aside from some pain in his right hand at the DIP and PIP joints most likely due to osteoarthritis  Past Medical History:  Diagnosis Date  . Anxiety   . Diarrhea   . Hypertension   . OSA (obstructive sleep  apnea)    cpap 9 cm H2O   Past Surgical History:  Procedure Laterality Date  . BIOPSY  06/21/2017   Procedure: BIOPSY;  Surgeon: Rogene Houston, MD;  [facility];  Service: Endoscopy;;  rectal, righta and left colon;  . COLONOSCOPY N/A 06/21/2017   Procedure: COLONOSCOPY;  Surgeon: Rogene Houston, MD;  Location: [facility];  Service: Endoscopy;  Laterality: N/A;  1:25  . INGUINAL HERNIA REPAIR Right 04/04/2018   Procedure: HERNIA REPAIR INGUINAL ADULT WITH MESH;  Surgeon: Aviva Signs, MD;   Location: [facility];  Service: General;  Laterality: Right;  . KNEE SURGERY     tore mcl in high school (70's)   Current Outpatient Medications on File Prior to Visit  Medication Sig Dispense Refill  . aspirin 325 MG tablet Take 1 tablet (325 mg total) by mouth daily. 30 tablet 0  . atorvastatin (LIPITOR) 40 MG tablet Take 1 tablet (40 mg total) by mouth daily at 6 PM. 30 tablet 0  . Cholecalciferol (VITAMIN D3) 2000 units TABS Take 2,000 Units by mouth daily.    Marland Kitchen losartan (COZAAR) 100 MG tablet TAKE ONE (1) TABLET BY MOUTH EVERY DAY 90 tablet 1  . Menthol, Topical Analgesic, (BIOFREEZE ROLL-ON EX) Apply 1 application topically daily as needed (for knee pain).    . Mesalamine (DELZICOL) 400 MG CPDR DR capsule Take 800 mg by mouth 2 (two) times daily.    . vitamin C (ASCORBIC ACID) 500 MG tablet Take 500 mg by mouth daily.     No current facility-administered medications on file prior to visit.    No Known Allergies Social History   Socioeconomic History  . Marital status: Married    Spouse name: Not on file  . Number of children: Not on file  . Years of education: Not on file  . Highest education level: Not on file  Occupational History  . Not on file  Social Needs  . Financial resource strain: Not on file  . Food insecurity:    Worry: Not on file    Inability: Not on file  . Transportation needs:    Medical: Not on file    Non-medical: Not on file  Tobacco Use  . Smoking status: Never Smoker  . Smokeless tobacco: Never Used  Substance and Sexual Activity  . Alcohol use: No    Alcohol/week: 0.0 oz  . Drug use: No  . Sexual activity: Not on file  Lifestyle  . Physical activity:    Days per week: Not on file    Minutes per session: Not on file  . Stress: Not on file  Relationships  . Social connections:    Talks on phone: Not on file    Gets together: Not on file    Attends religious service: Not on file    Active member of club or organization: Not on file     Attends meetings of clubs or organizations: Not on file    Relationship status: Not on file  . Intimate partner violence:    Fear of current or ex partner: Not on file    Emotionally abused: Not on file    Physically abused: Not on file    Forced sexual activity: Not on file  Other Topics Concern  . Not on file  Social History Narrative  . Not on file   Family History  Problem Relation Age of Onset  . Tuberculosis Mother   . Stroke Mother   .  Heart disease Father        died at 37  . Bradycardia Brother        pacemaker  . Cancer Paternal Uncle   . Mental illness Paternal Grandfather        suicide at 52  . Colon cancer Neg Hx       Review of Systems  All other systems reviewed and are negative.      Objective:   Physical Exam  Constitutional: He is oriented to person, place, and time. He appears well-developed and well-nourished. No distress.  HENT:  Head: Normocephalic and atraumatic.  Right Ear: External ear normal.  Left Ear: External ear normal.  Nose: Nose normal.  Mouth/Throat: Oropharynx is clear and moist. No oropharyngeal exudate.  Eyes: Pupils are equal, round, and reactive to light. Conjunctivae and EOM are normal. Right eye exhibits no discharge. Left eye exhibits no discharge. No scleral icterus.  Neck: Normal range of motion. Neck supple. No JVD present. No tracheal deviation present. No thyromegaly present.  Cardiovascular: Normal rate, regular rhythm, normal heart sounds and intact distal pulses. Exam reveals no gallop and no friction rub.  No murmur heard. Pulmonary/Chest: Effort normal and breath sounds normal. No stridor. No respiratory distress. He has no wheezes. He has no rales. He exhibits no tenderness.  Abdominal: Soft. Bowel sounds are normal. He exhibits no distension and no mass. There is no tenderness. There is no rebound and no guarding.  Musculoskeletal: Normal range of motion. He exhibits no edema, tenderness or deformity.    Lymphadenopathy:    He has no cervical adenopathy.  Neurological: He is alert and oriented to person, place, and time. He has normal reflexes. No cranial nerve deficit. He exhibits normal muscle tone. Coordination normal.  Skin: Skin is warm. No rash noted. He is not diaphoretic. No erythema. No pallor.  Psychiatric: He has a normal mood and affect. His behavior is normal. Judgment and thought content normal.  Vitals reviewed.         Assessment & Plan:  Hospital discharge follow-up - Plan: CBC with Differential/Platelet, COMPLETE METABOLIC PANEL WITH GFR  Benign essential HTN  Right facial numbness  Cerebrovascular accident (CVA) due to occlusion of small artery ([place])  Patient is currently taking 325 mg aspirin.  He has a follow-up appointment scheduled to see neurology in 4 weeks.  At that time, I will defer to the neurologist but I anticipate that aspirin will be decreased from 325 mg a day to 81 mg a day.  When the patient had his lacunar infarct, he was on no antiplatelet agent as he had just stopped the medication prior to surgery.  Therefore I do not see this is a failure of aspirin and I believe 81 milligrams a day would be sufficient.  I explained to the patient that he should remain on high-dose statin indefinitely despite having relatively good cholesterol due to its pleiotropic effects at prevention of stroke and heart attack.  Ideally I would like to see his blood pressure in the 130s over 80s however he is just recently suffered a lacunar infarct.  Therefore I recommended that we allow his blood pressure to remain slightly elevated in the 140s over 90s for at least the first few weeks.  After that, if his blood pressure continues to remain elevated in the 140s over 90s, I would add hydrochlorothiazide with an ultimate goal blood pressure of 130/80.  Patient will call back with his blood pressure in 1 week.  I will obtain a CBC and a CMP today

## 2018-04-19 NOTE — Telephone Encounter (Signed)
PA Submitted through [email or web address] and received the following:  Approvedtoday  Your PA request has been approved. Additional information will be provided in the approval communication. (Message [id number]) Pharm aware

## 2018-04-20 ENCOUNTER — Encounter (HOSPITAL_COMMUNITY): Payer: Self-pay | Admitting: Specialist

## 2018-04-20 ENCOUNTER — Encounter: Payer: Self-pay | Admitting: Family Medicine

## 2018-04-20 ENCOUNTER — Other Ambulatory Visit: Payer: Self-pay

## 2018-04-20 ENCOUNTER — Ambulatory Visit (HOSPITAL_COMMUNITY): Payer: BC Managed Care – PPO | Attending: Internal Medicine | Admitting: Specialist

## 2018-04-20 DIAGNOSIS — R208 Other disturbances of skin sensation: Secondary | ICD-10-CM | POA: Insufficient documentation

## 2018-04-20 DIAGNOSIS — R29898 Other symptoms and signs involving the musculoskeletal system: Secondary | ICD-10-CM | POA: Insufficient documentation

## 2018-04-20 LAB — COMPLETE METABOLIC PANEL WITH GFR
AG Ratio: 1.6 (calc) (ref 1.0–2.5)
ALBUMIN MSPROF: 4.2 g/dL (ref 3.6–5.1)
ALKALINE PHOSPHATASE (APISO): 79 U/L (ref 40–115)
ALT: 14 U/L (ref 9–46)
AST: 16 U/L (ref 10–35)
BILIRUBIN TOTAL: 0.7 mg/dL (ref 0.2–1.2)
BUN: 17 mg/dL (ref 7–25)
CHLORIDE: 102 mmol/L (ref 98–110)
CO2: 27 mmol/L (ref 20–32)
Calcium: 9.9 mg/dL (ref 8.6–10.3)
Creat: 0.98 mg/dL (ref 0.70–1.25)
GFR, EST AFRICAN AMERICAN: 95 mL/min/{1.73_m2} (ref >=60)
GFR, Est Non African American: 82 mL/min/{1.73_m2} (ref >=60)
GLOBULIN: 2.6 g/dL (ref 1.9–3.7)
GLUCOSE: 83 mg/dL (ref 65–99)
Potassium: 4.5 mmol/L (ref 3.5–5.3)
Sodium: 138 mmol/L (ref 135–146)
TOTAL PROTEIN: 6.8 g/dL (ref 6.1–8.1)

## 2018-04-20 LAB — CBC WITH DIFFERENTIAL/PLATELET
BASOS ABS: 42 {cells}/uL (ref 0–200)
BASOS PCT: 0.4 %
EOS ABS: 242 {cells}/uL (ref 15–500)
Eosinophils Relative: 2.3 %
HCT: 43.5 % (ref 38.5–50.0)
HEMOGLOBIN: 15.3 g/dL (ref 13.2–17.1)
Lymphs Abs: 1806 cells/uL (ref 850–3900)
MCH: 31.7 pg (ref 27.0–33.0)
MCHC: 35.2 g/dL (ref 32.0–36.0)
MCV: 90.2 fL (ref 80.0–100.0)
MONOS PCT: 9.4 %
MPV: 9.3 fL (ref 7.5–12.5)
NEUTROS ABS: 7424 {cells}/uL (ref 1500–7800)
Neutrophils Relative %: 70.7 %
Platelets: 447 10*3/uL — ABNORMAL HIGH (ref 140–400)
RBC: 4.82 10*6/uL (ref 4.20–5.80)
RDW: 12 % (ref 11.0–15.0)
Total Lymphocyte: 17.2 %
WBC: 10.5 10*3/uL (ref 3.8–10.8)
WBCMIX: 987 {cells}/uL — AB (ref 200–950)

## 2018-04-20 NOTE — Patient Instructions (Signed)
Home Exercises Program Theraputty Exercises  Do the following exercises 2-3 times a day using your affected hand.  1. Roll putty into a ball.  2. Make into a pancake.  3. Roll putty into a roll.  4. Pinch along log with first finger and thumb.   5. Make into a ball.  6. Roll it back into a log.   7. Pinch using thumb and side of first finger.  8. Roll into a ball, then flatten into a pancake.  9. Using your fingers, make putty into a mountain.

## 2018-04-20 NOTE — Therapy (Signed)
Justin [name], [place], [address] Phone: [phone]   Fax:  [phone]  Occupational Therapy Evaluation  Patient Details  Name: Justin [name] MRN: [id number] Date of Birth: [birth year] Referring Provider: Dr. Karleen Hampshire   Encounter Date: 04/20/2018  OT End of Session - 04/20/18 1411    Visit Number  1    Number of Visits  8    Date for OT Re-Evaluation  05/18/18    Authorization Type  [employer]    Authorization Time Period  no auth of visit limit    OT Start Time  1115    OT Stop Time  1200    OT Time Calculation (min)  45 min    Activity Tolerance  Patient tolerated treatment well    Behavior During Therapy  [facility] for tasks assessed/performed       Past Medical History:  Diagnosis Date  . Anxiety   . Diarrhea   . Hypertension   . OSA (obstructive sleep apnea)    cpap 9 cm H2O    Past Surgical History:  Procedure Laterality Date  . BIOPSY  06/21/2017   Procedure: BIOPSY;  Surgeon: Rogene Houston, MD;  [facility];  Service: Endoscopy;;  rectal, righta and left colon;  . COLONOSCOPY N/A 06/21/2017   Procedure: COLONOSCOPY;  Surgeon: Rogene Houston, MD;  [facility];  Service: Endoscopy;  Laterality: N/A;  1:25  . INGUINAL HERNIA REPAIR Right 04/04/2018   Procedure: HERNIA REPAIR INGUINAL ADULT WITH MESH;  Surgeon: Aviva Signs, MD;  Location: [facility];  Service: General;  Laterality: Right;  . KNEE SURGERY     tore mcl in high school (70's)    There were no vitals filed for this visit.  Subjective Assessment - 04/20/18 1407    Subjective   S:  I want to get rid of the numbness in my arm.    Patient is accompained by:  Family member    Pertinent History  [name] reports that he began noticing stroke like symptoms in his right arm on Friday 04/13/18.  He presented to [facility] ED and was transferred to [facility] and was admitted for care due to a Acute lacunar infarct in the lateral  Left thalamus with right side sensory impairments.  He was discharged home on 04/15/18 and has been referred to occupational therapy for evaluation and treatment.     Special Tests  FOTO scored 81%    Patient Stated Goals  I want to get rid of the numbness in my arm.    Currently in Pain?  No/denies    Pain Score  0-No pain        [facility] OT Assessment - 04/20/18 0001      Assessment   Medical Diagnosis  Right Arm Sensory Changes S/P CVA    Referring Provider  Dr. Karleen Hampshire    Onset Date/Surgical Date  04/13/18    Hand Dominance  Right    Next MD Visit  Dr. Dennard Schaumann was seen on 04/19/18    Prior Therapy  OT/PT evaluation while at [facility]      Precautions   Precautions  None      Restrictions   Weight Bearing Restrictions  No      Balance Screen   Has the patient fallen in the past 6 months  No    Has the patient had a decrease in activity level because of a fear  of falling?   No    Is the patient reluctant to leave their home because of a fear of falling?   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        Coordination   9 Hole Peg Test  Right;Left    Right 9 Hole Peg Test  21.78"    Left 9 Hole Peg Test  23.92"       ROM / Strength   AROM / PROM / Strength  Strength      Strength   Overall Strength Comments  A/ROM is WNL, strength is 4+/5 in right upper extremity      Hand Function   Right Hand Grip (lbs)  62    Right Hand Lateral Pinch  20 lbs    Right Hand 3 Point Pinch  16 lbs    Left Hand Grip (lbs)  66    Left Hand Lateral Pinch  24 lbs    Left 3 point pinch  17 lbs               OT Treatments/Exercises (OP) - 04/20/18 0001      ADLs   ADL Comments  educated patient on importance of regular rest periods throughout the day, as he is still recovering from stroke and brain is healing.  Patient agreed to rest at lest 1-2 times per day and have a soft entry back to work and yard work            [employer] - 04/20/18 1411    Education Details  educated patient and his wife on use of pink theraputty for grip and pinch strengthening     Person(s) Educated  Patient;Spouse    Methods  Explanation;Demonstration;Handout    Comprehension  Verbalized understanding;Returned demonstration       OT Short Term Goals - 04/20/18 1417      OT SHORT TERM GOAL #1   Title  Patient will be educated on a HEP for right hand strengthening.     Time  4    Period  Weeks    Status  New    Target Date  05/18/18      OT SHORT TERM GOAL #2   Title  Patient will  improve right arm strength to 5/5 in order to cllimb ladders at work.     Time  4    Period  Weeks    Status  New      OT SHORT TERM GOAL #3   Title  Patient will improve right hand grip strength by 5 pounds and pinch strength by 2 pounds in order to open containers and utiilze power tools without difficulty.     Time  4    Period  Weeks    Status  New      OT SHORT TERM GOAL #4   Title  Patietn will  improve light touch sensation to normal sensation for improved safety at work.     Time  4    Period  Weeks    Status  New               Plan - 04/20/18 1412    Clinical Impression Statement  A:  Patient is a 62 year old male presenting with right arm numbness due to recent left thalamic cva on 04/13/18.  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  Current deficits include decreased right upper extremity, grip, and pinch strength, as well as numbness in his right arm and hand with normal-diminshed light touch sensation in his right hand fingertips.  He will benefit from skilled OT intervention to improve upon his deficits so that he can return to prior level of independence with all activities and use of his right arm as dominant.     Occupational Profile and client history currently impacting functional performance  age, overall health, motivation    Occupational performance deficits (Please refer to evaluation for details):  ADL's;Work;Leisure    Rehab Potential  Excellent    Current Impairments/barriers affecting progress:  n/a    OT Frequency  2x / week    OT Duration  4 weeks    OT Treatment/Interventions  Self-care/ADL training;Therapeutic exercise;Splinting;Neuromuscular education;Therapeutic activities;DME and/or AE instruction;Patient/family education;Moist Heat;Passive range of motion;Coping strategies training    Plan  P:  Skilled OT intervention to decrease numbness and improve right upper extremity strength and sensation in order to return to prior level of independence with all daily activities and work activities using his right arm as dominant.  Next session:  review plan of care and begin proximal shoulder sterngthening, arm strengthening, and grip/pinch strengthening.     Clinical Decision Making  Several treatment options, min-mod task modification necessary    OT Home Exercise Plan  theraputty    Recommended Other Services  has PT referral, with OT  screen, PT is not indicated at this time       Patient will benefit from skilled therapeutic intervention in order to improve the following deficits and impairments:  Decreased strength, Impaired sensation, Impaired UE functional use  Visit Diagnosis: Other disturbances of skin sensation - Plan: Ot plan of care cert/re-cert  Other symptoms and signs involving the musculoskeletal system - Plan: Ot plan of care cert/re-cert    Problem List Patient Active Problem List   Diagnosis Date Noted  . Cerebral embolism with cerebral infarction 04/15/2018  . Right facial numbness 04/13/2018  . Non-recurrent unilateral inguinal hernia without obstruction or gangrene   . Obstructive sleep apnea treated with continuous positive airway pressure (CPAP) 03/13/2018  . Hypersomnia with sleep apnea 12/15/2017  . Diarrhea 05/08/2017  . Chest pain 05/19/2015  . Anxiety 05/19/2015  . Hyperglycemia 05/19/2015  . Hypertension  Vangie Bicker, [place], OTR/L [phone]  04/20/2018, 3:07 PM  [place] [address] [place], [place], [address] Phone: [phone]   Fax:  [phone]  Name: Justin [name] MRN: [id number] Date of Birth: 1956/09/12

## 2018-04-23 ENCOUNTER — Ambulatory Visit (HOSPITAL_COMMUNITY): Payer: BC Managed Care – PPO

## 2018-04-23 ENCOUNTER — Other Ambulatory Visit: Payer: Self-pay

## 2018-04-23 ENCOUNTER — Encounter (HOSPITAL_COMMUNITY): Payer: Self-pay

## 2018-04-23 DIAGNOSIS — R208 Other disturbances of skin sensation: Principal | ICD-10-CM

## 2018-04-23 DIAGNOSIS — R29898 Other symptoms and signs involving the musculoskeletal system: Secondary | ICD-10-CM

## 2018-04-23 NOTE — Therapy (Addendum)
[place], [place], [address] Phone: [phone]   Fax:  [phone]  Occupational Therapy Treatment  Patient Details  Name: Justin [name] MRN: [id number] Date of Birth: Sep 04, 1956 Referring Provider: Dr. Karleen Hampshire   Encounter Date: 04/23/2018  OT End of Session - 04/23/18 1644    Visit Number  2    Number of Visits  8    Date for OT Re-Evaluation  05/18/18    Authorization Type  BCBS    Authorization Time Period  no auth of visit limit    OT Start Time  1610 patient arrived late   OT Stop Time  1645    OT Time Calculation (min)  35 min    Activity Tolerance  Patient tolerated treatment well    Behavior During Therapy  [facility] for tasks assessed/performed       Past Medical History:  Diagnosis Date  . Anxiety   . Diarrhea   . Hypertension   . OSA (obstructive sleep apnea)    cpap 9 cm H2O    Past Surgical History:  Procedure Laterality Date  . BIOPSY  06/21/2017   Procedure: BIOPSY;  Surgeon: Rogene Houston, MD;  [facility];  Service: Endoscopy;;  rectal, righta and left colon;  . COLONOSCOPY N/A 06/21/2017   Procedure: COLONOSCOPY;  Surgeon: Rogene Houston, MD;  Location: [facility];  Service: Endoscopy;  Laterality: N/A;  1:25  . INGUINAL HERNIA REPAIR Right 04/04/2018   Procedure: HERNIA REPAIR INGUINAL ADULT WITH MESH;  Surgeon: Aviva Signs, MD;  Location: [facility];  Service: General;  Laterality: Right;  . KNEE SURGERY     tore mcl in high school (70's)    There were no vitals filed for this visit.  Subjective Assessment - 04/23/18 1625    Subjective   S: When I'm using it, I really don't even notice that there is pain/numbness.    Patient is accompained by:  Family member    Pertinent History  --    Special Tests  --    Patient Stated Goals  --    Currently in Pain?  No/denies                   OT Treatments/Exercises (OP) - 04/23/18 1616      Exercises   Exercises  Shoulder;Elbow;Wrist;Hand;Theraputty      Shoulder Exercises: Standing   Protraction  Theraband;10 reps    Theraband Level (Shoulder Protraction)  Level 3 (Green)    Horizontal ABduction  Theraband;10 reps    Theraband Level (Shoulder Horizontal ABduction)  Level 3 (Green)    External Rotation  Theraband;10 reps    Theraband Level (Shoulder External Rotation)  Level 3 (Green)    Internal Rotation  Theraband;10 reps    Theraband Level (Shoulder Internal Rotation)  Level 3 (Green)    Flexion  Theraband;10 reps    Theraband Level (Shoulder Flexion)  Level 3 (Green)    Extension  [employer] reps    Theraband Level (Shoulder Extension)  Level 3 (Green)    Row  [employer] reps    Theraband Level (Shoulder Row)  Level 3 (Green)    Retraction  Theraband;10 reps    Theraband Level (Shoulder Retraction)  Level 3 (Green)      Shoulder Exercises: ROM/Strengthening   UBE (Upper Arm Bike)  Level 5; 3' forward and 3" in reverse    Proximal Shoulder Strengthening, Seated  10X; 3#  Additional Elbow Exercises   Hand Gripper with Large Beads  all beads at 50# with gripper held horizontally    Hand Gripper with Medium Beads  all beads at 50# with gripper held horizontally    Hand Gripper with Small Beads  all beads at 50# with gripper held horizontally      Hand Exercises   Other Hand Exercises  Patient used green clothespin to pick up all sponges; half with lateral pinch and half with three point pinch             OT Education - 04/23/18 1638    Education Details  Patient given a copy of his evaluation and reviewed goals with therapist. Patient is in agreement with all goals.    Person(s) Educated  Patient    Methods  Explanation;Handout    Comprehension  Verbalized understanding       OT Short Term Goals - 04/23/18 1645      OT SHORT TERM GOAL #1   Title  Patient will be educated on a HEP for right hand strengthening.     Time  4    Period  Weeks    Status   On-going      OT SHORT TERM GOAL #2   Title  Patient will improve right arm strength to 5/5 in order to cllimb ladders at work.     Time  4    Period  Weeks    Status  On-going      OT SHORT TERM GOAL #3   Title  Patient will improve right hand grip strength by 5 pounds and pinch strength by 2 pounds in order to open containers and utiilze power tools without difficulty.     Time  4    Period  Weeks    Status  On-going      OT SHORT TERM GOAL #4   Title  Patietn will improve light touch sensation to normal sensation for improved safety at work.     Time  4    Period  Weeks    Status  On-going               Plan - 04/23/18 1646    Clinical Impression Statement  A: Session focused on RUE strengthening, shoulder stability, grip strength, and pinch strength. Patient still maintains numbness in the RUE but reports he is able to complete all daily tasks. Patient demonstrated [facility] UE ROM and good UE strength this session. Evaluation reviewed with patient and he is in agreement with all goals. Patient reports compliance with HEP. Patient required minimal VC for technique and to maintain a slower pace.     Current Impairments/barriers affecting progress:  n/a    Plan  P:  Continue with shoulder strengthening, grip strengthening, and pinch strengthening. Increase reps for shoulder strengthening theraband exercises and increase resistance for gripper exercise.    Consulted and Agree with Plan of Care  Patient       Patient will benefit from skilled therapeutic intervention in order to improve the following deficits and impairments:  Decreased strength, Impaired UE functional use, Impaired sensation  Visit Diagnosis: Other disturbances of skin sensation  Other symptoms and signs involving the musculoskeletal system    Problem List Patient Active Problem List   Diagnosis Date Noted  . Cerebral embolism with cerebral infarction 04/15/2018  . Right facial numbness 04/13/2018  .  Non-recurrent unilateral inguinal hernia without obstruction or gangrene   . Obstructive sleep apnea treated  with continuous positive airway pressure (CPAP) 03/13/2018  . Hypersomnia with sleep apnea 12/15/2017  . Diarrhea 05/08/2017  . Chest pain 05/19/2015  . Anxiety 05/19/2015  . Hyperglycemia 05/19/2015  . Hypertension     Roderic Palau, OT student 04/23/2018, 4:53 PM  [facility] [address] [place], [place], [address] Phone: [phone]   Fax:  [phone]  Name: Justin [name] MRN: [id number] Date of Birth: 01-Jul-1956

## 2018-04-27 ENCOUNTER — Ambulatory Visit (HOSPITAL_COMMUNITY): Payer: BC Managed Care – PPO | Admitting: Occupational Therapy

## 2018-04-27 ENCOUNTER — Encounter (HOSPITAL_COMMUNITY): Payer: Self-pay | Admitting: Occupational Therapy

## 2018-04-27 DIAGNOSIS — R29898 Other symptoms and signs involving the musculoskeletal system: Secondary | ICD-10-CM

## 2018-04-27 DIAGNOSIS — R208 Other disturbances of skin sensation: Secondary | ICD-10-CM | POA: Diagnosis not present

## 2018-04-27 NOTE — Therapy (Signed)
[place], [place], [address] Phone: [phone]   Fax:  [phone]  Occupational Therapy Treatment  Patient Details  Name: RAZA BAYLESS MRN: [id number] Date of Birth: 04-02-1956 Referring Provider: Dr. Karleen Hampshire   Encounter Date: 04/27/2018  OT End of Session - 04/27/18 1201    Visit Number  3    Number of Visits  8    Date for OT Re-Evaluation  05/18/18    Authorization Type  BCBS    Authorization Time Period  no auth of visit limit    OT Start Time  1115    OT Stop Time  1159    OT Time Calculation (min)  44 min    Activity Tolerance  Patient tolerated treatment well    Behavior During Therapy  [facility] for tasks assessed/performed       Past Medical History:  Diagnosis Date  . Anxiety   . Diarrhea   . Hypertension   . OSA (obstructive sleep apnea)    cpap 9 cm H2O    Past Surgical History:  Procedure Laterality Date  . BIOPSY  06/21/2017   Procedure: BIOPSY;  Surgeon: Rogene Houston, MD;  [facility];  Service: Endoscopy;;  rectal, righta and left colon;  . COLONOSCOPY N/A 06/21/2017   Procedure: COLONOSCOPY;  Surgeon: Rogene Houston, MD;  [facility];  Service: Endoscopy;  Laterality: N/A;  1:25  . INGUINAL HERNIA REPAIR Right 04/04/2018   Procedure: HERNIA REPAIR INGUINAL ADULT WITH MESH;  Surgeon: Aviva Signs, MD;  Location: [facility];  Service: General;  Laterality: Right;  . KNEE SURGERY     tore mcl in high school (70's)    There were no vitals filed for this visit.  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     Currently in Pain?  No/denies         [facility] OT Assessment - 04/27/18 1112      Assessment   Medical Diagnosis  Right Arm Sensory Changes S/P CVA      Precautions   Precautions  None               OT Treatments/Exercises (OP) - 04/27/18 1116      Exercises   Exercises   Shoulder;Elbow;Wrist;Hand;Theraputty      Shoulder Exercises: Standing   Protraction  Theraband;12 reps    Theraband Level (Shoulder Protraction)  Level 3 (Green)    Horizontal ABduction  Theraband;12 reps    Theraband Level (Shoulder Horizontal ABduction)  Level 3 (Green)    External Rotation  Theraband;12 reps    Theraband Level (Shoulder External Rotation)  Level 3 (Green)    Flexion  Theraband;12 reps    Theraband Level (Shoulder Flexion)  Level 3 (Green)    ABduction  Theraband;12 reps    Theraband Level (Shoulder ABduction)  Level 3 (Green)      Shoulder Exercises: ROM/Strengthening   Cybex Press  3 plate;15 reps    Ball on Wall  1' flexion 1' abduction      Additional Elbow Exercises   Hand Gripper with Large Beads  all beads gripper at 55#    Hand Gripper with Medium Beads  all beads gripper at 55#    Hand Gripper with Small Beads  all beads gripper at 55#      Additional Wrist Exercises   Sponges  Pt used blue clothespin  in 3 point pinch to grasp and stack 6 sponges, keeping elbow by his side. Pt then did a pyramid shape using blue clothespin. Min/mod difficulty.              OT Education - 04/27/18 1200    Education Details  green theraband shoulder strengthening    Person(s) Educated  Patient    Methods  Explanation;Demonstration;Handout    Comprehension  Verbalized understanding;Returned demonstration       OT Short Term Goals - 04/23/18 1645      OT SHORT TERM GOAL #1   Title  Patient will be educated on a HEP for right hand strengthening.     Time  4    Period  Weeks    Status  On-going      OT SHORT TERM GOAL #2   Title  Patient will improve right arm strength to 5/5 in order to cllimb ladders at work.     Time  4    Period  Weeks    Status  On-going      OT SHORT TERM GOAL #3   Title  Patient will improve right hand grip strength by 5 pounds and pinch strength by 2 pounds in order to open containers and utiilze power tools without difficulty.      Time  4    Period  Weeks    Status  On-going      OT SHORT TERM GOAL #4   Title  Patietn will improve light touch sensation to normal sensation for improved safety at work.     Time  4    Period  Weeks    Status  On-going               Plan - 04/27/18 1201    Clinical Impression Statement  A: Continued with grip and pinch strengthening, increasing gripper resistance and pt complete with gripper held vertically. Also continued with shoulder strengthening and scapular stability adding cybex press and ball on wall. Updated HEP for green theraband. Pt with mod difficulty with form during ball on wall, increased time for gripper task completion. Verbal cuing for form and technique during exercises and activities.     Plan  P: Continue with grip strengthening with gripper held vertically. Follow up on HEP, add therapy ball exercises for shoulder strengthening       Patient will benefit from skilled therapeutic intervention in order to improve the following deficits and impairments:  Decreased strength, Impaired UE functional use, Impaired sensation  Visit Diagnosis: Other disturbances of skin sensation  Other symptoms and signs involving the musculoskeletal system    Problem List Patient Active Problem List   Diagnosis Date Noted  . Cerebral embolism with cerebral infarction 04/15/2018  . Right facial numbness 04/13/2018  . Non-recurrent unilateral inguinal hernia without obstruction or gangrene   . Obstructive sleep apnea treated with continuous positive airway pressure (CPAP) 03/13/2018  . Hypersomnia with sleep apnea 12/15/2017  . Diarrhea 05/08/2017  . Chest pain 05/19/2015  . Anxiety 05/19/2015  . Hyperglycemia 05/19/2015  . Hypertension    [name], OTR/L  [phone] 04/27/2018, 12:04 PM  [facility] [address] [place], [place], [address] Phone: [phone]   Fax:  [phone]  Name: ARINZE RIVADENEIRA MRN:  [id number] Date of Birth: 1956-01-16

## 2018-04-27 NOTE — Patient Instructions (Signed)
Theraband strengthening: Complete 10-15X, 1-2X/day  1) Shoulder protraction  Anchor band in doorway, stand with back to door. Push your hand forward as much as you can to bringing your shoulder blades forward on your rib cage.     2) Shoulder flexion  While standing with back to the door, holding Theraband at hand level, raise arm in front of you.  Keep elbow straight through entire movement.      3) Shoulder horizontal abduction  Standing with a theraband anchored at chest height, begin with arm straight and some tension in the band. Move your arm out to your side (keeping straight the whole time). Bring the affected arm back to midline.     4) Shoulder Internal Rotation  While holding an elastic band at your side with your elbow bent, start with your hand away from your stomach, then pull the band towards your stomach. Keep your elbow near your side the entire time.     5) Shoulder External Rotation  While holding an elastic band at your side with your elbow bent, start with your hand near your stomach and then pull the band away. Keep your elbow at your side the entire time.     6) Shoulder abduction  While holding an elastic band at your side, draw up your arm to the side keeping your elbow straight.

## 2018-04-30 ENCOUNTER — Ambulatory Visit (HOSPITAL_COMMUNITY): Payer: BC Managed Care – PPO

## 2018-04-30 ENCOUNTER — Encounter (HOSPITAL_COMMUNITY): Payer: Self-pay

## 2018-04-30 ENCOUNTER — Other Ambulatory Visit: Payer: Self-pay

## 2018-04-30 DIAGNOSIS — R29898 Other symptoms and signs involving the musculoskeletal system: Secondary | ICD-10-CM

## 2018-04-30 DIAGNOSIS — R208 Other disturbances of skin sensation: Secondary | ICD-10-CM | POA: Diagnosis not present

## 2018-04-30 NOTE — Therapy (Signed)
[place], [place], [address] Phone: [phone]   Fax:  [phone]  Occupational Therapy Treatment  Patient Details  Name: Justin [name] MRN: [id number] Date of Birth: April 10, 1956 Referring Provider: Dr. Karleen Hampshire   Encounter Date: 04/30/2018  OT End of Session - 04/30/18 1616    Visit Number  4    Number of Visits  8    Date for OT Re-Evaluation  05/18/18    Authorization Type  BCBS    Authorization Time Period  no auth of visit limit    OT Start Time  1604    OT Stop Time  1644    OT Time Calculation (min)  40 min    Activity Tolerance  Patient tolerated treatment well    Behavior During Therapy  [facility] for tasks assessed/performed       Past Medical History:  Diagnosis Date  . Anxiety   . Diarrhea   . Hypertension   . OSA (obstructive sleep apnea)    cpap 9 cm H2O    Past Surgical History:  Procedure Laterality Date  . BIOPSY  06/21/2017   Procedure: BIOPSY;  Surgeon: Rogene Houston, MD;  [facility];  Service: Endoscopy;;  rectal, righta and left colon;  . COLONOSCOPY N/A 06/21/2017   Procedure: COLONOSCOPY;  Surgeon: Rogene Houston, MD;  [facility];  Service: Endoscopy;  Laterality: N/A;  1:25  . INGUINAL HERNIA REPAIR Right 04/04/2018   Procedure: HERNIA REPAIR INGUINAL ADULT WITH MESH;  Surgeon: Aviva Signs, MD;  Location: [facility];  Service: General;  Laterality: Right;  . KNEE SURGERY     tore mcl in high school (70's)    There were no vitals filed for this visit.  Subjective Assessment - 04/30/18 1601    Subjective   S: My knuckles have been swollen and painful.    Currently in Pain?  Yes    Pain Score  7     Pain Location  Other (Comment) knuckles    Pain Orientation  Right    Pain Descriptors / Indicators  Sore    Pain Type  Acute pain    Pain Onset  In the past 7 days    Pain Frequency  Intermittent    Aggravating Factors   movement    Pain Relieving Factors   cream    Effect of Pain on Daily Activities  moderate effect on ADLs    Multiple Pain Sites  No         [facility] OT Assessment - 04/30/18 1601      Assessment   Medical Diagnosis  Right Arm Sensory Changes S/P CVA      Precautions   Precautions  None               OT Treatments/Exercises (OP) - 04/30/18 1601      Exercises   Exercises  Shoulder;Elbow;Wrist;Hand;Theraputty      Shoulder Exercises: Standing   Protraction  Theraband;15 reps    Theraband Level (Shoulder Protraction)  Level 3 (Green)    Horizontal ABduction  Theraband;20 reps    Theraband Level (Shoulder Horizontal ABduction)  Level 3 (Green)    External Rotation  Theraband;15 reps    Theraband Level (Shoulder External Rotation)  Level 3 (Green)    Internal Rotation  Theraband;15 reps    Theraband Level (Shoulder Internal Rotation)  Level 3 (Green)    Flexion  Theraband;15 reps  Theraband Level (Shoulder Flexion)  Level 3 (Green)    ABduction  Theraband;15 reps    Theraband Level (Shoulder ABduction)  Level 3 (Green)    Extension  [employer] reps    Theraband Level (Shoulder Extension)  Level 3 (Green)    Row  [employer] reps    Theraband Level (Shoulder Row)  Level 3 (Green)    Retraction  Theraband;15 reps    Theraband Level (Shoulder Retraction)  Level 3 (Green)      Shoulder Exercises: Therapy Ball   Flexion  Both;10 reps    ABduction  Both;10 reps    Other Therapy Ball Exercises  chest press; overhead press; 10X    Other Therapy Ball Exercises  circles in each direction; 10X       Shoulder Exercises: ROM/Strengthening   Ball on Wall  --      Additional Elbow Exercises   Hand Gripper with Small Beads  all beads gripper at 55#      Fine Motor Coordination (Hand/Wrist)   Fine Motor Coordination  Manipulation of small objects    Manipulation of small objects  Patient worked on identifying small objects without looking such as penny, paper clip, and pen cap during stereognosis activity to  work on sensory reeducation and increasing use of the digits of right hand..               OT Short Term Goals - 04/23/18 1645      OT SHORT TERM GOAL #1   Title  Patient will be educated on a HEP for right hand strengthening.     Time  4    Period  Weeks    Status  On-going      OT SHORT TERM GOAL #2   Title  Patient will improve right arm strength to 5/5 in order to cllimb ladders at work.     Time  4    Period  Weeks    Status  On-going      OT SHORT TERM GOAL #3   Title  Patient will improve right hand grip strength by 5 pounds and pinch strength by 2 pounds in order to open containers and utiilze power tools without difficulty.     Time  4    Period  Weeks    Status  On-going      OT SHORT TERM GOAL #4   Title  Patietn will improve light touch sensation to normal sensation for improved safety at work.     Time  4    Period  Weeks    Status  On-going               Plan - 04/30/18 1618    Clinical Impression Statement  A: Patient presents with edema at the PIP joints of right hand. Patient reports it is causing him pain with movement. Will continue to monitor in subsequent sessions. Patient completed object identification activity without eyes closed for sensory reeducation and was able to identify 10/10 objects correctly. He reports he has occasional numbness and lack of sensation in the right hand. He reports difficulty with holding a screwdriver at home. Patient able to add reps for shoulder strengthening exercises with theraband. Mod VC required to control movements/slow them down. Therapy ball exercises added for shoulder stability/strengthening. Patient reports pain with grasping objects this session. Patient required rest breaks between exercises this session due to pain in knuckles and fatigue.     Plan  P: Follow up on  edema in PIP joint. If swelling persists, look into edema massage/compression. If improvement is seen, pick up with [facility] manipulation tasks  and gripper activity. Continue with shoulder strengthening and stability exercises. Add screwdriver/tool use simulation task.    Consulted and Agree with Plan of Care  Patient       Patient will benefit from skilled therapeutic intervention in order to improve the following deficits and impairments:  Decreased strength, Impaired UE functional use, Impaired sensation  Visit Diagnosis: Other disturbances of skin sensation  Other symptoms and signs involving the musculoskeletal system    Problem List Patient Active Problem List   Diagnosis Date Noted  . Cerebral embolism with cerebral infarction 04/15/2018  . Right facial numbness 04/13/2018  . Non-recurrent unilateral inguinal hernia without obstruction or gangrene   . Obstructive sleep apnea treated with continuous positive airway pressure (CPAP) 03/13/2018  . Hypersomnia with sleep apnea 12/15/2017  . Diarrhea 05/08/2017  . Chest pain 05/19/2015  . Anxiety 05/19/2015  . Hyperglycemia 05/19/2015  . Hypertension     Roderic Palau, OT student 04/30/2018, 4:51 PM  [facility] [address] [place], [place], [address] Phone: [phone]   Fax:  [phone]  Name: Justin [name] MRN: [id number] Date of Birth: 1955-11-18

## 2018-05-04 ENCOUNTER — Ambulatory Visit (HOSPITAL_COMMUNITY): Payer: BC Managed Care – PPO | Admitting: Occupational Therapy

## 2018-05-04 ENCOUNTER — Encounter (HOSPITAL_COMMUNITY): Payer: Self-pay | Admitting: Occupational Therapy

## 2018-05-04 DIAGNOSIS — R29898 Other symptoms and signs involving the musculoskeletal system: Secondary | ICD-10-CM

## 2018-05-04 DIAGNOSIS — R208 Other disturbances of skin sensation: Secondary | ICD-10-CM | POA: Diagnosis not present

## 2018-05-04 NOTE — Therapy (Signed)
[place], [place], [address] Phone: [phone]   Fax:  [phone]  Occupational Therapy Treatment  Patient Details  Name: Justin [name] MRN: [id number] Date of Birth: Aug 31, 1956 Referring Provider: Dr. Karleen Hampshire   Encounter Date: 05/04/2018  OT End of Session - 05/04/18 1722    Visit Number  5    Number of Visits  8    Date for OT Re-Evaluation  05/18/18    Authorization Type  BCBS    Authorization Time Period  no auth of visit limit    OT Start Time  1641    OT Stop Time  1721    OT Time Calculation (min)  40 min    Activity Tolerance  Patient tolerated treatment well    Behavior During Therapy  [facility] for tasks assessed/performed       Past Medical History:  Diagnosis Date  . Anxiety   . Diarrhea   . Hypertension   . OSA (obstructive sleep apnea)    cpap 9 cm H2O    Past Surgical History:  Procedure Laterality Date  . BIOPSY  06/21/2017   Procedure: BIOPSY;  Surgeon: Rogene Houston, MD;  [facility];  Service: Endoscopy;;  rectal, righta and left colon;  . COLONOSCOPY N/A 06/21/2017   Procedure: COLONOSCOPY;  Surgeon: Rogene Houston, MD;  [facility];  Service: Endoscopy;  Laterality: N/A;  1:25  . INGUINAL HERNIA REPAIR Right 04/04/2018   Procedure: HERNIA REPAIR INGUINAL ADULT WITH MESH;  Surgeon: Aviva Signs, MD;  Location: [facility];  Service: General;  Laterality: Right;  . KNEE SURGERY     tore mcl in high school (70's)    There were no vitals filed for this visit.  Subjective Assessment - 05/04/18 1634    Subjective   S: I think I may have arthritis in my fingers.     Currently in Pain?  No/denies         [facility] OT Assessment - 05/04/18 1634      Assessment   Medical Diagnosis  Right Arm Sensory Changes S/P CVA      Precautions   Precautions  None               OT Treatments/Exercises (OP) - 05/04/18 1640      Exercises   Exercises   Shoulder;Elbow;Wrist;Hand;Theraputty      Shoulder Exercises: Standing   Protraction  Theraband;15 reps    Theraband Level (Shoulder Protraction)  Level 3 (Green)    Horizontal ABduction  Theraband;15 reps    Theraband Level (Shoulder Horizontal ABduction)  Level 3 (Green)    Flexion  Theraband;15 reps    Theraband Level (Shoulder Flexion)  Level 3 (Green)    ABduction  Theraband;15 reps    Theraband Level (Shoulder ABduction)  Level 3 (Green)    Other Standing Exercises  green theraband: er with shoulder and elbow in 90 degrees flexion, 15X    Other Standing Exercises  green theraband: IR with shoulder in 90 degrees abduction, elbow in 90 degrees flexion, 15X      Shoulder Exercises: Therapy Ball   Other Therapy Ball Exercises  diagonals, 15X each direction    Other Therapy Ball Exercises  circles in each direction; 15X       Additional Elbow Exercises   Hand Gripper with Medium Beads  all beads gripper at 61#      Fine Motor Coordination (Hand/Wrist)   Fine  Motor Coordination  Grooved pegs    Grooved pegs  Pt completed grooved pegboard using tweezers to place pegs. Activity working on sustained grasp and pinch. Pt with min difficulty with task, slightly increased time required for placing pegs. Pt removed one at a time and placed in palm, dropping 3.                OT Short Term Goals - 04/23/18 1645      OT SHORT TERM GOAL #1   Title  Patient will be educated on a HEP for right hand strengthening.     Time  4    Period  Weeks    Status  On-going      OT SHORT TERM GOAL #2   Title  Patient will improve right arm strength to 5/5 in order to cllimb ladders at work.     Time  4    Period  Weeks    Status  On-going      OT SHORT TERM GOAL #3   Title  Patient will improve right hand grip strength by 5 pounds and pinch strength by 2 pounds in order to open containers and utiilze power tools without difficulty.     Time  4    Period  Weeks    Status  On-going      OT  SHORT TERM GOAL #4   Title  Patietn will improve light touch sensation to normal sensation for improved safety at work.     Time  4    Period  Weeks    Status  On-going               Plan - 05/04/18 1706    Clinical Impression Statement  A: Pt with slight edema at right 2nd and 3rd PIP joints, believes possibly arthritis, slight discomfort noted  occasionally, not impeding ability to complete exercises. Continued with shoulder strengthening using green theraband and therapy ball, cuing to use only RUE for completion and not to compensate with trunk. Increased gripper resistance for medium beads, min/mod difficulty with task. Added grooved pegboard tweezer task for sustained pinch, pt did well with this task. Verbal cuing for form and technique during exercises.     Plan  P: Continue with shoulder strengthening resuming cybex row/press, add ball drops in abduction. Complete simulation with screwdriver        Patient will benefit from skilled therapeutic intervention in order to improve the following deficits and impairments:  Decreased strength, Impaired UE functional use, Impaired sensation  Visit Diagnosis: Other disturbances of skin sensation  Other symptoms and signs involving the musculoskeletal system    Problem List Patient Active Problem List   Diagnosis Date Noted  . Cerebral embolism with cerebral infarction 04/15/2018  . Right facial numbness 04/13/2018  . Non-recurrent unilateral inguinal hernia without obstruction or gangrene   . Obstructive sleep apnea treated with continuous positive airway pressure (CPAP) 03/13/2018  . Hypersomnia with sleep apnea 12/15/2017  . Diarrhea 05/08/2017  . Chest pain 05/19/2015  . Anxiety 05/19/2015  . Hyperglycemia 05/19/2015  . Hypertension    [name], OTR/L  [phone] 05/04/2018, 5:23 PM  [facility] [address] [place], [place], [address] Phone: [phone]   Fax:   [phone]  Name: Justin [name] MRN: [id number] Date of Birth: 1956-08-13

## 2018-05-07 ENCOUNTER — Ambulatory Visit (HOSPITAL_COMMUNITY): Payer: BC Managed Care – PPO | Attending: Internal Medicine

## 2018-05-07 ENCOUNTER — Other Ambulatory Visit: Payer: Self-pay

## 2018-05-07 ENCOUNTER — Encounter (HOSPITAL_COMMUNITY): Payer: Self-pay

## 2018-05-07 DIAGNOSIS — R208 Other disturbances of skin sensation: Secondary | ICD-10-CM | POA: Diagnosis present

## 2018-05-07 DIAGNOSIS — R29898 Other symptoms and signs involving the musculoskeletal system: Secondary | ICD-10-CM | POA: Diagnosis present

## 2018-05-07 NOTE — Therapy (Signed)
[place], [place], [address] Phone: [phone]   Fax:  [phone]  Occupational Therapy Treatment  Patient Details  Name: Justin [name] MRN: [id number] Date of Birth: 09-Aug-1956 Referring Provider: Dr. Karleen Hampshire   Encounter Date: 05/07/2018  OT End of Session - 05/07/18 1711    Visit Number  6    Number of Visits  8    Date for OT Re-Evaluation  05/18/18    Authorization Type  BCBS    Authorization Time Period  no auth of visit limit    OT Start Time  0459    OT Stop Time  1715    OT Time Calculation (min)  44 min    Activity Tolerance  Patient tolerated treatment well    Behavior During Therapy  [facility] for tasks assessed/performed       Past Medical History:  Diagnosis Date  . Anxiety   . Diarrhea   . Hypertension   . OSA (obstructive sleep apnea)    cpap 9 cm H2O    Past Surgical History:  Procedure Laterality Date  . BIOPSY  06/21/2017   Procedure: BIOPSY;  Surgeon: Rogene Houston, MD;  [facility];  Service: Endoscopy;;  rectal, righta and left colon;  . COLONOSCOPY N/A 06/21/2017   Procedure: COLONOSCOPY;  Surgeon: Rogene Houston, MD;  [facility];  Service: Endoscopy;  Laterality: N/A;  1:25  . INGUINAL HERNIA REPAIR Right 04/04/2018   Procedure: HERNIA REPAIR INGUINAL ADULT WITH MESH;  Surgeon: Aviva Signs, MD;  Location: [facility];  Service: General;  Laterality: Right;  . KNEE SURGERY     tore mcl in high school (70's)    There were no vitals filed for this visit.  Subjective Assessment - 05/07/18 1634    Subjective   S: I am still having some swelling and pain in the fingers so I made an appointment with a hand specialist.     Currently in Pain?  No/denies         [facility] OT Assessment - 05/07/18 1629      Assessment   Medical Diagnosis  Right Arm Sensory Changes S/P CVA      Precautions   Precautions  None               OT Treatments/Exercises (OP) -  05/07/18 1629      Exercises   Exercises  Shoulder;Elbow;Wrist;Hand;Theraputty      Shoulder Exercises: Standing   Protraction  Theraband;20 reps    Theraband Level (Shoulder Protraction)  Level 3 (Green)    Horizontal ABduction  Theraband;20 reps    Theraband Level (Shoulder Horizontal ABduction)  Level 3 (Green)    Flexion  Theraband;20 reps    Theraband Level (Shoulder Flexion)  Level 3 (Green)    ABduction  Theraband;20 reps    Theraband Level (Shoulder ABduction)  Level 3 (Green)    Other Standing Exercises  green theraband: er with shoulder and elbow in 90 degrees flexion, 15X    Other Standing Exercises  green theraband: IR with shoulder in 90 degrees abduction, elbow in 90 degrees flexion, 15X      Shoulder Exercises: Therapy Ball   Other Therapy Ball Exercises  diagonals, 20X each direction    Other Therapy Ball Exercises  circles in each direction; 20X       Shoulder Exercises: ROM/Strengthening   Cybex Press  3 plate;20 reps    Cybex Row  3 plate;20 reps    Other ROM/Strengthening Exercises  Patient held 4# weight with arm extended overhead and walked around the gym for 2 minutes.    Other ROM/Strengthening Exercises  Ball drops with green weighted ball for 1" in flexion Patient dropped ball once      Hand Exercises   Other Hand Exercises  Patient used a hammer to put 10 nails into wooden board.               OT Short Term Goals - 04/23/18 1645      OT SHORT TERM GOAL #1   Title  Patient will be educated on a HEP for right hand strengthening.     Time  4    Period  Weeks    Status  On-going      OT SHORT TERM GOAL #2   Title  Patient will improve right arm strength to 5/5 in order to cllimb ladders at work.     Time  4    Period  Weeks    Status  On-going      OT SHORT TERM GOAL #3   Title  Patient will improve right hand grip strength by 5 pounds and pinch strength by 2 pounds in order to open containers and utiilze power tools without difficulty.      Time  4    Period  Weeks    Status  On-going      OT SHORT TERM GOAL #4   Title  Patietn will improve light touch sensation to normal sensation for improved safety at work.     Time  4    Period  Weeks    Status  On-going               Plan - 05/07/18 1715    Clinical Impression Statement  A: Pt with slight edema at right 2nd and 3rd PIP joints, believes possibly arthritis, slight discomfort noted  occasionally, not impeding ability to complete exercises. Patient reports he has made an appointment with a hand specialist for the beginning of August. Continued with shoulder strengthening using green theraband and therapy ball exercises. Added hammering with nails task and overhead cary task to simulate work activities. Continued with cybex row and press. Patient performed well with strengthening with no rest breaks required between exercises. Moderate verbal cuing for form and technique during exercises.     Plan  P: Discuss with patient and assess potential for discharge. Complete measurements if appropriate. Continue with shoulder strengthening/stability and grip and pinch strengthening.    Consulted and Agree with Plan of Care  Patient       Patient will benefit from skilled therapeutic intervention in order to improve the following deficits and impairments:  Decreased strength, Impaired UE functional use, Impaired sensation  Visit Diagnosis: Other disturbances of skin sensation  Other symptoms and signs involving the musculoskeletal system    Problem List Patient Active Problem List   Diagnosis Date Noted  . Cerebral embolism with cerebral infarction 04/15/2018  . Right facial numbness 04/13/2018  . Non-recurrent unilateral inguinal hernia without obstruction or gangrene   . Obstructive sleep apnea treated with continuous positive airway pressure (CPAP) 03/13/2018  . Hypersomnia with sleep apnea 12/15/2017  . Diarrhea 05/08/2017  . Chest pain 05/19/2015  . Anxiety  05/19/2015  . Hyperglycemia 05/19/2015  . Hypertension     Roderic Palau, OT student 05/07/2018, 5:20 PM  [facility] [place]  [place], [place], [address] Phone: [phone]   Fax:  [phone]  Name: ALI MOHL MRN: [id number] Date of Birth: 09-29-1956

## 2018-05-11 ENCOUNTER — Ambulatory Visit (HOSPITAL_COMMUNITY): Payer: BC Managed Care – PPO | Admitting: Occupational Therapy

## 2018-05-11 ENCOUNTER — Encounter (HOSPITAL_COMMUNITY): Payer: Self-pay | Admitting: Occupational Therapy

## 2018-05-11 DIAGNOSIS — R208 Other disturbances of skin sensation: Principal | ICD-10-CM

## 2018-05-11 DIAGNOSIS — R29898 Other symptoms and signs involving the musculoskeletal system: Secondary | ICD-10-CM

## 2018-05-11 NOTE — Therapy (Signed)
[place], [place], [address] Phone: [phone]   Fax:  [phone]  Occupational Therapy Treatment  Patient Details  Name: Justin [name] MRN: [id number] Date of Birth: 1956/06/18 Referring Provider: Dr. Karleen Hampshire   Encounter Date: 05/11/2018  OT End of Session - 05/11/18 1718    Visit Number  7    Number of Visits  8    Date for OT Re-Evaluation  05/18/18    Authorization Type  BCBS    Authorization Time Period  no auth of visit limit    OT Start Time  1647    OT Stop Time  1728    OT Time Calculation (min)  41 min    Activity Tolerance  Patient tolerated treatment well    Behavior During Therapy  [facility] for tasks assessed/performed       Past Medical History:  Diagnosis Date  . Anxiety   . Diarrhea   . Hypertension   . OSA (obstructive sleep apnea)    cpap 9 cm H2O    Past Surgical History:  Procedure Laterality Date  . BIOPSY  06/21/2017   Procedure: BIOPSY;  Surgeon: Rogene Houston, MD;  [facility];  Service: Endoscopy;;  rectal, righta and left colon;  . COLONOSCOPY N/A 06/21/2017   Procedure: COLONOSCOPY;  Surgeon: Rogene Houston, MD;  [facility];  Service: Endoscopy;  Laterality: N/A;  1:25  . INGUINAL HERNIA REPAIR Right 04/04/2018   Procedure: HERNIA REPAIR INGUINAL ADULT WITH MESH;  Surgeon: Aviva Signs, MD;  Location: [facility];  Service: General;  Laterality: Right;  . KNEE SURGERY     tore mcl in high school (70's)    There were no vitals filed for this visit.  Subjective Assessment - 05/11/18 1647    Subjective   S: I'm still having trouble using tools because I can't feel them the same as I used to.     Currently in Pain?  No/denies         [facility] OT Assessment - 05/11/18 1647      Assessment   Medical Diagnosis  Right Arm Sensory Changes S/P CVA      Precautions   Precautions  None               OT Treatments/Exercises (OP) - 05/11/18 1648       Exercises   Exercises  Shoulder;Elbow;Wrist;Hand;Theraputty      Shoulder Exercises: Therapy Ball   Other Therapy Ball Exercises  chest press, flexion, horizontal abuction 20X each    Other Therapy Ball Exercises  circles, diagonals 20X each      Shoulder Exercises: ROM/Strengthening   Cybex Press  3 plate;20 reps    Cybex Row  3 plate;20 reps    Other ROM/Strengthening Exercises  Patient held 4# weight with arm extended overhead and walked around the gym for 2 minutes.    Other ROM/Strengthening Exercises  ball drops green ball 1' flexion, 1' abduction      Additional Elbow Exercises   Hand Gripper with Medium Beads  all beads gripper at 61#    Hand Gripper with Small Beads  all beads gripper at 55#      Hand Exercises   Other Hand Exercises  Pt completed timed perfection game on 2 trials using only right hand for placing shapes. Trial 1: 1'45" Trial 2: 1'20"      Fine Motor Coordination (Hand/Wrist)   Fine Motor Coordination  Nuts and Bolts;Paperclip maze    Paperclip Maze  Pt hooked and unhooked 4 paperclips with no difficulty.     Nuts and Bolts  Pt put together nut, bolt, and washer with no difficulty               OT Short Term Goals - 04/23/18 1645      OT SHORT TERM GOAL #1   Title  Patient will be educated on a HEP for right hand strengthening.     Time  4    Period  Weeks    Status  On-going      OT SHORT TERM GOAL #2   Title  Patient will improve right arm strength to 5/5 in order to cllimb ladders at work.     Time  4    Period  Weeks    Status  On-going      OT SHORT TERM GOAL #3   Title  Patient will improve right hand grip strength by 5 pounds and pinch strength by 2 pounds in order to open containers and utiilze power tools without difficulty.     Time  4    Period  Weeks    Status  On-going      OT SHORT TERM GOAL #4   Title  Patietn will improve light touch sensation to normal sensation for improved safety at work.     Time  4    Period   Weeks    Status  On-going               Plan - 05/11/18 1716    Clinical Impression Statement  A: Pt reports continued difficulty with using tools and holding objects during work. Began session with fine motor coordination and grip strengthening activities. Continue with shoulder strengthening using green therapy ball, adding ball drops in abduction, continued with cybex and overhead carry. Verbal cuing for form and technique as well as to slow down during exercises. Discussed progress with pt and potential discharge next session.     Plan  P: Reassessment & discharge with updated HEP       Patient will benefit from skilled therapeutic intervention in order to improve the following deficits and impairments:  Decreased strength, Impaired UE functional use, Impaired sensation  Visit Diagnosis: Other disturbances of skin sensation  Other symptoms and signs involving the musculoskeletal system    Problem List Patient Active Problem List   Diagnosis Date Noted  . Cerebral embolism with cerebral infarction 04/15/2018  . Right facial numbness 04/13/2018  . Non-recurrent unilateral inguinal hernia without obstruction or gangrene   . Obstructive sleep apnea treated with continuous positive airway pressure (CPAP) 03/13/2018  . Hypersomnia with sleep apnea 12/15/2017  . Diarrhea 05/08/2017  . Chest pain 05/19/2015  . Anxiety 05/19/2015  . Hyperglycemia 05/19/2015  . Hypertension    Justin [name], OTR/L  [phone] 05/11/2018, 5:29 PM  [facility] [address] [place], [place], [address] Phone: [phone]   Fax:  [phone]  Name: Justin [name] MRN: [id number] Date of Birth: [birth year]

## 2018-05-15 ENCOUNTER — Ambulatory Visit (HOSPITAL_COMMUNITY): Payer: BC Managed Care – PPO | Admitting: Occupational Therapy

## 2018-05-15 ENCOUNTER — Encounter (HOSPITAL_COMMUNITY): Payer: Self-pay | Admitting: Occupational Therapy

## 2018-05-15 DIAGNOSIS — R208 Other disturbances of skin sensation: Principal | ICD-10-CM

## 2018-05-15 DIAGNOSIS — R29898 Other symptoms and signs involving the musculoskeletal system: Secondary | ICD-10-CM

## 2018-05-15 NOTE — Therapy (Signed)
[place], [place], [address] Phone: [phone]   Fax:  [phone]  Occupational Therapy Reassessment, Treatment, and Discharge Summary  Patient Details  Name: Justin [name] MRN: [id number] Date of Birth: [birth year] Referring Provider: Dr. Karleen Hampshire   Encounter Date: 05/15/2018  OT End of Session - 05/15/18 1718    Visit Number  8    Number of Visits  8    Date for OT Re-Evaluation  05/18/18    Authorization Type  BCBS    Authorization Time Period  no auth of visit limit    OT Start Time  1647    OT Stop Time  1717    OT Time Calculation (min)  30 min    Activity Tolerance  Patient tolerated treatment well    Behavior During Therapy  [facility] for tasks assessed/performed       Past Medical History:  Diagnosis Date  . Anxiety   . Diarrhea   . Hypertension   . OSA (obstructive sleep apnea)    cpap 9 cm H2O    Past Surgical History:  Procedure Laterality Date  . BIOPSY  06/21/2017   Procedure: BIOPSY;  Surgeon: Rogene Houston, MD;  [facility];  Service: Endoscopy;;  rectal, righta and left colon;  . COLONOSCOPY N/A 06/21/2017   Procedure: COLONOSCOPY;  Surgeon: Rogene Houston, MD;  [facility];  Service: Endoscopy;  Laterality: N/A;  1:25  . INGUINAL HERNIA REPAIR Right 04/04/2018   Procedure: HERNIA REPAIR INGUINAL ADULT WITH MESH;  Surgeon: Aviva Signs, MD;  Location: [facility];  Service: General;  Laterality: Right;  . KNEE SURGERY     tore mcl in high school (70's)    There were no vitals filed for this visit.  Subjective Assessment - 05/15/18 1717    Subjective   S: I've been using a post hole digger today.    Currently in Pain?  No/denies         [facility] OT Assessment - 05/15/18 1646      Assessment   Medical Diagnosis  Right Arm Sensory Changes S/P CVA      Precautions   Precautions  None      Observation/Other Assessments   Focus on Therapeutic Outcomes (FOTO)   97%  81% previous      Sensation   Semmes Weinstein Monofilament Scale  Normal previously at diminished light touch      Strength   Overall Strength Comments  strength is 5/5 in RUE 4+/5 previous      Hand Function   Right Hand Grip (lbs)  67 62 previous    Right Hand Lateral Pinch  20 lbs same as previous    Right Hand 3 Point Pinch  16 lbs same as previous               OT Treatments/Exercises (OP) - 05/15/18 1700      Exercises   Exercises  Shoulder;Elbow;Wrist;Hand;Theraputty      Shoulder Exercises: Standing   Protraction  Theraband;20 reps    Theraband Level (Shoulder Protraction)  Level 3 (Green)    Horizontal ABduction  Theraband;20 reps    Theraband Level (Shoulder Horizontal ABduction)  Level 3 (Green)    External Rotation  Theraband;15 reps    Theraband Level (Shoulder External Rotation)  Level 3 (Green)    Flexion  Theraband;20 reps    Theraband Level (Shoulder Flexion)  Level 3 (Green)  ABduction  Theraband;20 reps    Theraband Level (Shoulder ABduction)  Level 3 (Green)      Additional Elbow Exercises   Hand Gripper with Small Beads  all beads gripper at 55#             OT Education - 05/15/18 1722    Education Details  reviewed strengthening HEP    Person(s) Educated  Patient    Methods  Explanation    Comprehension  Verbalized understanding       OT Short Term Goals - 05/15/18 1718      OT SHORT TERM GOAL #1   Title  Patient will be educated on a HEP for right hand strengthening.     Time  4    Period  Weeks    Status  Achieved      OT SHORT TERM GOAL #2   Title  Patient will improve right arm strength to 5/5 in order to cllimb ladders at work.     Time  4    Period  Weeks    Status  Achieved      OT SHORT TERM GOAL #3   Title  Patient will improve right hand grip strength by 5 pounds and pinch strength by 2 pounds in order to open containers and utiilze power tools without difficulty.     Time  4    Period  Weeks    Status   Partially Met      OT SHORT TERM GOAL #4   Title  Patietn will improve light touch sensation to normal sensation for improved safety at work.     Time  4    Period  Weeks    Status  Partially Met               Plan - 05/15/18 1718    Clinical Impression Statement  A: Reassessment completed this session, pt has met 2/4 goals and partially met the remaining 2 goals. Pt has made improvements with RUE strength and grip strength, pinch strength has remained steady. Pt able to distinguish light touch testing today with Thornell Mule. Pt continues to report some sensation issues when working. Pt is agreeable to discharge and continue HEP as he is functioning at independent level with all B/IADL tasks.     Plan  P: Discharge pt       Patient will benefit from skilled therapeutic intervention in order to improve the following deficits and impairments:  Decreased strength, Impaired UE functional use, Impaired sensation  Visit Diagnosis: Other disturbances of skin sensation  Other symptoms and signs involving the musculoskeletal system    Problem List Patient Active Problem List   Diagnosis Date Noted  . Cerebral embolism with cerebral infarction 04/15/2018  . Right facial numbness 04/13/2018  . Non-recurrent unilateral inguinal hernia without obstruction or gangrene   . Obstructive sleep apnea treated with continuous positive airway pressure (CPAP) 03/13/2018  . Hypersomnia with sleep apnea 12/15/2017  . Diarrhea 05/08/2017  . Chest pain 05/19/2015  . Anxiety 05/19/2015  . Hyperglycemia 05/19/2015  . Hypertension    [name], OTR/L  [phone] 05/15/2018, 5:23 PM  [place], [place], [address] Phone: [phone]   Fax:  [phone]  Name: Justin [name] MRN: [id number] Date of Birth: September 08, 1956    OCCUPATIONAL THERAPY DISCHARGE SUMMARY  Visits from Start of Care: 8  Current functional level  related to goals / functional outcomes: See above.  Pt is using heavy work equipment with little to no difficulty, is able to manipulate tools required for job completion. Pt is at independent level with B/IADLs.    Remaining deficits: Occasional difficulty with small tools   Education / Equipment: HEP for RUE strengthening Plan: Patient agrees to discharge.  Patient goals were partially met. Patient is being discharged due to meeting the stated rehab goals.  ?????

## 2018-05-17 ENCOUNTER — Encounter (HOSPITAL_COMMUNITY): Payer: BC Managed Care – PPO

## 2018-05-21 ENCOUNTER — Ambulatory Visit: Payer: BC Managed Care – PPO | Admitting: Family Medicine

## 2018-05-22 ENCOUNTER — Encounter: Payer: Self-pay | Admitting: Neurology

## 2018-05-24 ENCOUNTER — Ambulatory Visit: Payer: BC Managed Care – PPO | Admitting: Neurology

## 2018-05-24 ENCOUNTER — Encounter: Payer: Self-pay | Admitting: Neurology

## 2018-05-24 VITALS — BP 160/105 | HR 66 | Ht 73.0 in | Wt 196.0 lb

## 2018-05-24 DIAGNOSIS — I639 Cerebral infarction, unspecified: Secondary | ICD-10-CM | POA: Diagnosis not present

## 2018-05-24 DIAGNOSIS — I6381 Other cerebral infarction due to occlusion or stenosis of small artery: Secondary | ICD-10-CM | POA: Diagnosis not present

## 2018-05-24 HISTORY — DX: Other cerebral infarction due to occlusion or stenosis of small artery: I63.81

## 2018-05-24 NOTE — Patient Instructions (Signed)
Rehabilitation After a Stroke, Adult A stroke causes damage to the brain cells, which can affect your ability to walk, talk, or remember things. The impact of a stroke is different for everyone, and so is recovery. Some people have progress during the first few days after treatment. Others may take weeks or longer to make progress. Stroke rehabilitation includes a variety of treatments to help you recover and promote your independence after a stroke. You may not be able do everything that you did before the stroke, but you can learn ways to manage your lifestyle and be as independent as possible. Rehabilitation will start as soon as you are able to participate after your stroke, and it involves care from a team that may include:  Family and friends. Your loved ones know you best and can be very helpful in your recovery.  Physicians.  Nurses.  Physical and occupational therapists.  Speech-language therapists.  A nutritionist.  A psychologist.  A Education officer, museum.  Keep open communication with all members of your care team. Share your medical records if needed, and take notes about each provider's recommendations. What is physical therapy? Physical therapists (PTs) help you to improve your coordination, balance, and muscle strength. Physical therapy may involve:  Range of motion exercises.  Help to move between lying, sitting, and standing positions.  Walking with a cane or walker, if needed.  Help using stairs.  What is occupational therapy? Occupational therapists (OTs) help you rebuild your ability to do everyday tasks, such as brushing your teeth, going to the bathroom, eating, and getting dressed. Occupational therapy may also help with:  Vision. Visual scanning is a technique that is used to prevent falls.  Memory and cognitive training. This therapy includes problem-solving techniques and relearning tasks like making a phone call.  Fine muscle movements such as buttoning a  shirt or picking up small objects.  What is speech therapy? Speech-language therapists help you communicate. After a stroke, you may have problems understanding what people are saying, or you may have trouble writing, speaking, or finding the right word for what you want to say. You may also need speech therapy if you have difficulty swallowing while eating and drinking. Examples of speech-language therapies include:  Techniques to strengthen muscles used in swallowing.  Naming objects or describing pictures. This helps retrain the brain to recognize and remember words.  Exercises to strengthen the muscles involved in talking, including your tongue and lips.  Exercises to retrain your brain in understanding what you read and hear.  How often will I need therapy? Therapy will begin as soon as you are able to participate, which is often within the first few days after a stroke. Sessions will be frequent at first. For example, you may have therapy 2-3 hours a day on most days of the week during the first few months. The intensity depends on the type and severity of your stroke. You may need therapy for several months. Therapy may take place in the hospital, at a rehabilitation center, or in your home. Are there any side effects of therapy? Therapy is safe and is usually well-tolerated. You may feel physically and mentally tired after therapy, especially during the first few weeks. Rest before therapy sessions if you need to so you can get the most out of your rehabilitation. Follow these instructions at home:  Involve your family and friends in your recovery, if possible. Having another person to encourage you is beneficial.  Follow instructions from your speech-language therapist,  nutritionist, or health care provider about what you can safely eat and drink. Eat healthy foods. If your ability to swallow was affected by the stroke, you may need to take steps to avoid choking, such as: ? Taking  small bites when eating. ? Eating foods that are soft or pureed. ? Drinking liquids that have been thickened.  Maintain social connections and interactions with friends, family, and community groups. This is an important part of your recovery. Communication challenges and physical challenges may cause you to feel isolated after a stroke.  Consider joining a support group that allows you to talk about the impact of stroke on your life. A psychologist or counselor may be recommended. Your emotional recovery from stroke is just as important as your physical recovery.  Keep all follow-up visits as told by your health care providers. This is important. Summary  Stroke rehabilitation includes a variety of treatments to help you recover and promote your independence after a stroke.  Rehabilitation will start as soon as you are able to participate after your stroke, and it includes care from a team of experts.  The intensity of therapy depends on the type and severity of your stroke. You may need therapy for several months. This information is not intended to replace advice given to you by your health care provider. Make sure you discuss any questions you have with your health care provider. Document Released: 11/13/2007 Document Revised: 10/25/2016 Document Reviewed: 10/25/2016 Elsevier Interactive Patient Education  [employer].

## 2018-05-24 NOTE — Progress Notes (Signed)
[facility]   Provider:  [name], [place] D  Primary Care Physician:  [name], MD   Referring Provider: [name], MD   Chief Complaint  Patient presents with  . Follow-up    rm 10,pt with wife,pt had hernia surgery and right after dc from that he developed tingling sensation on right side of lips and 2 days later the symptom came back and went down the right side. went to the hospital. pt had a recent stroke. presents today to follow up from stroke. PCP would on what cholesterol medication should be started for pt.    HPI:  ERIK BURKETT is a 62 y.o. male ,  Seen in STROKE follow up -Mr. Rochelle Nephew is a 62 year old male patient whom I had initially seen earlier this year for evaluation of sleep apnea unfortunately the patient underwent a right-sided inguinal hernia repair for which she had to discontinue his full size aspirin 5 days prior.  Also aspirin was removed within 2 or 3 days post surgery he did suffer a stroke.  And his main symptom was right-sided numbness and clumsiness sensory loss of the right dominant hand.  I see him today after he has undergone a stroke work-up in the hospital which included a head CT without contrast on 7 June, an MRI MRA of head and neck, the findings were an acute lacunar infarct in the left lateral thalamus there was no hemorrhage, he had negative abnormalities he was negative for abnormalities in the vascular tree his neck MRA showed mild atherosclerosis at the origins of the left internal carotid artery and right subclavian artery but not significant stenosis and he had very mild for age nonspecific white matter signal changes these are commonly seen the small vessel disease there was no brain atrophy noted.  He had presented to the hospital ED with elevated blood pressures which may explain the lacunar appearance of a previous stroke.  Head CT was negative.  Onset of symptoms was on 13 April 2018 he was evaluated by Dr. Kerney Elbe neurologist on-call, who also mentioned that the patient has a history of obstructive sleep apnea and is currently treated at 9 cmH2O pressure.  The patient restarted on aspirin metabolic panels were normal his creatinine was 1.05 his BUN 24 which may indicate that he was slightly dehydrated, his CBC was normal but that the count was 9.6K hemoglobin 15.1 hematocrit 50.1 without any evidence of anemia normal platelet count 350 3K.  Cardiac enzymes were negative his total cholesterol was actually rather low at 133 ng but is good "" cholesterol HDL was only 30.  He finished OT and PT- he is awaiting surgery for TKR on the left knee. Dr Amedeo Plenty follows. I would like for him to have 2-3 month of daily exercise 30 minutes, walking or biking, and some weight training. Control BP and cholesterol. Diet adjustment.  More fruit and vegetables.    HPI : Seen here as in a referral from Dr. Dennard Schaumann for a sleep consult.  I have the pleasure of meeting Mr. and Mrs. Muellner today, Mr. Figgs is referred for evaluation of excessive daytime sleepiness, witnessed apneas, and some confusion when aroused from sleep.Mr. Maher has no significant medical history, his only diagnosis is a recent hernia and at one time he was evaluated by cardiology with a cardiac echo that returned normal.  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 in the day may be followed by several hours in the office.Mr. Geraghty has developed over the last years a habit of falling asleep in the den watching TV.  He may sleep there already 2 hours in a recliner before he will go over to the bedroom.  The couple at bedtime is now around 1130.  The bedroom is cool quiet and dark, the patient sleeps on a flat mattress, he sleeps on one pillow for head support one pillow between his knees and  rest on his side. He will turned to supine sleep soon after he is asleep.  When he snores loudest, and that is when apneas have been witnessed. He will have always 1 sometimes 2 or 3 bathroom breaks depending on fluid intake, his first bathroom break is usually around 3:00 AM. He does not recall vivid or lucid dreams and there is no dream enactment.  He wakes up spontaneously not depending on work requirements, he seems to wake up spontaneously between 3 and 5 AM and often has difficulties to go back to sleep.  In that case he usually goes back to his recliner.  He estimates that he gets only 4-5 hours of nocturnal sleep and some nights less. He has not fallen asleep at job sites, but once he is no longer physically active or mentally stimulated he struggles to stay awake. On a week end he may take a nap- 20 minute power to 1 hour.  Sleep medical history and family sleep history:  His father died young - heavy smoker , blood clot at 43, his mother at 40, he was not aware of any sleep disorders in his parents.  The patient has 1 older brother, unaware of any sleep issues.  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  He has never used tobacco products, he does not consume alcohol, but caffeine : Drinks colas, but not more than 1 a day.  On occasion iced tea, not coffee.  He does not drink coffee but decaffeinated tea at home.  He returned to work on July 8th.   Review of Systems: Out of a complete 14 system review, the patient complains of only the following symptoms, and all other reviewed systems are negative.  His Epworth Sleepiness Scale is only endorsed at 7 points- wife endorsed at 14 points.  Fatigue severity was endorsed at 45 points. Snoring, witnessed apneas, EDS-   Social History   Socioeconomic History  . Marital status: Married    Spouse name: Not on file  . Number of children: Not on  file  . Years of education: Not on file  . Highest education level: Not on file  Occupational History  . Not on file  Social Needs  . Financial resource strain: Not on file  . Food insecurity:    Worry: Not on file    Inability: Not on file  . Transportation needs:    Medical: Not on file    Non-medical: Not on file  Tobacco Use  . Smoking status: Never Smoker  . Smokeless tobacco: Never Used  Substance and Sexual Activity  . Alcohol use: No    Alcohol/week: 0.0 oz  . Drug use: No  . Sexual activity: Not on file  Lifestyle  . Physical activity:    Days per week: Not on  file    Minutes per session: Not on file  . Stress: Not on file  Relationships  . Social connections:    Talks on phone: Not on file    Gets together: Not on file    Attends religious service: Not on file    Active member of club or organization: Not on file    Attends meetings of clubs or organizations: Not on file    Relationship status: Not on file  . Intimate partner violence:    Fear of current or ex partner: Not on file    Emotionally abused: Not on file    Physically abused: Not on file    Forced sexual activity: Not on file  Other Topics Concern  . Not on file  Social History Narrative  . Not on file    Family History  Problem Relation Age of Onset  . Tuberculosis Mother   . Stroke Mother   . Heart disease Father        died at 36  . Bradycardia Brother        pacemaker  . Cancer Paternal Uncle   . Mental illness Paternal Grandfather        suicide at 51  . Colon cancer Neg Hx     Past Medical History:  Diagnosis Date  . Anxiety   . Diarrhea   . Hypertension   . OSA (obstructive sleep apnea)    cpap 9 cm H2O    Past Surgical History:  Procedure Laterality Date  . BIOPSY  06/21/2017   Procedure: BIOPSY;  Surgeon: Rogene Houston, MD;  Location: [facility];  Service: Endoscopy;;  rectal, righta and left colon;  . COLONOSCOPY N/A 06/21/2017   Procedure: COLONOSCOPY;   Surgeon: Rogene Houston, MD;  Location: [facility];  Service: Endoscopy;  Laterality: N/A;  1:25  . INGUINAL HERNIA REPAIR Right 04/04/2018   Procedure: HERNIA REPAIR INGUINAL ADULT WITH MESH;  Surgeon: Aviva Signs, MD;  Location: [facility];  Service: General;  Laterality: Right;  . KNEE SURGERY     tore mcl in high school (70's)    Current Outpatient Medications  Medication Sig Dispense Refill  . aspirin 325 MG tablet Take 1 tablet (325 mg total) by mouth daily. 30 tablet 0  . Cholecalciferol (VITAMIN D3) 2000 units TABS Take 2,000 Units by mouth daily.    . diclofenac sodium (VOLTAREN) 1 % GEL Apply 2 g topically 4 (four) times daily. 100 g 1  . losartan (COZAAR) 100 MG tablet TAKE ONE (1) TABLET BY MOUTH EVERY DAY 90 tablet 1  . Menthol, Topical Analgesic, (BIOFREEZE ROLL-ON EX) Apply 1 application topically daily as needed (for knee pain).    . Mesalamine (DELZICOL) 400 MG CPDR DR capsule Take 800 mg by mouth 2 (two) times daily.    . vitamin C (ASCORBIC ACID) 500 MG tablet Take 500 mg by mouth daily.    Marland Kitchen atorvastatin (LIPITOR) 40 MG tablet Take 40 mg by mouth daily at 6 PM.     No current facility-administered medications for this visit.     Allergies as of 05/24/2018  . (No Known Allergies)    Vitals: BP (!) 160/105   Pulse 66   Ht 6' 1"  (1.854 m)   Wt 196 lb (88.9 kg)   BMI 25.86 kg/m  Last Weight:  Wt Readings from Last 1 Encounters:  05/24/18 196 lb (88.9 kg)   JJH:ERDE mass index is 25.86 kg/m.  Last Height:   Ht Readings from Last 1 Encounters:  05/24/18 6' 1"  (1.854 m)    Physical exam:  General: The patient is awake, alert and appears not in acute distress. The patient is well groomed. Head: Normocephalic, atraumatic. Neck is supple. Mallampati 4,  neck circumference: 17.5. Nasal airflow congestion,   Retrognathia is seen.  Cardiovascular:  Regular rate and rhythm , without  murmurs or carotid bruit, and without distended neck veins. Respiratory:  Lungs are clear to auscultation. Skin:  Without evidence of edema, or rash Trunk: BMI is 25, The patient's posture is erect    Neurologic exam : The patient is awake and alert, oriented to place and time.   Attention span & concentration ability appears normal.  He has trouble recalling names.  Speech is fluent,  Without  dysarthria, dysphonia or aphasia.  Mood and affect are appropriate.  Cranial nerves: Pupils are equal and briskly reactive to light. Funduscopic exam without evidence of pallor or edema- retinal tear laser scar in the right eye. Floaters.  . Extraocular movements  in vertical and horizontal planes intact and but with a mild  Nystagmus- not just at endpoint. Visual fields by finger perimetry are intact. Hearing to finger rub intact. Facial sensation to fine touch decreased lower right face  Decreased nasolabial fld on the right. Facial motor strength is symmetric and tongue and uvula move midline. Shoulder shrug was symmetrical.   Motor exam:  Right grip strength  lightly weakened,  with intact pinch  Normal tone, muscle bulk and symmetric strength in all extremities. Left Knee crepitus, meniscus damage.  Right leg strength -  Sensory:  Fine touch, pinprick  Reduced in right leg, lateral edge of the right foot, and right lower face.  Right hand feels heavy. Coordination: Rapid alternating movements in the fingers/hands were fast  normal. Finger-to-nose maneuver normal without evidence of ataxia, dysmetria but a mild action tremor. He noted changed  Handwriting.  Gait and station: Patient walks without assistive device and is able unassisted to climb up to the exam table. Strength within normal limits.  Stance is stable and normal.   Toe stand deferred  .Tandem gait is now slightly fragmented. Turns with 3 Steps.  Deep tendon reflexes: in the  upper and lower extremities are symmetric, upgoing babinski on the right .     Assessment:  After physical and neurologic examination,  review of laboratory studies,  Personal review of imaging studies, reports of other /same  Imaging studies, results of polysomnography and / or neurophysiology testing and pre-existing records as far as provided in visit., my assessment is   1) now onset stroke while being off ASA for hernia surgery.   1) Mr. Washington daytime sleepiness is not longer excessive. he does not have neuromuscular disorders, no cardiac or pulmonary disorder.  Plan :continue ASA. Decrease cholesterol by diet and exercise BP control- this is main risk factor of lacunar strokes.  Study data:  Comparison was made to the study of 05/20/2015.  Study status:  Routine.  Procedure:  The patient reported no pain pre or post test. Transthoracic echocardiography. Image quality was adequate.  Study completion:  There were no complications. Transthoracic echocardiography.  M-mode, complete 2D, spectral Doppler, and color Doppler.  Birthdate:  Patient birthdate: [birth year].  Age:  Patient is 62 yr old.  Sex:  Gender: male. BMI: 25.7 kg/m^2.  Blood pressure:     156/100  Patient status: Inpatient.  Study date:  Study date: 04/14/2018. Study  time: 04:30 PM.  Location:  Bedside.  -------------------------------------------------------------------  ------------------------------------------------------------------- Left ventricle:  The cavity size was normal. There was mild concentric hypertrophy. Systolic function was normal. The estimated ejection fraction was in the range of 55% to 60%. Wall motion was normal; there were no regional wall motion abnormalities. Doppler parameters are consistent with abnormal left ventricular relaxation (grade 1 diastolic dysfunction). There was no evidence of elevated ventricular filling pressure by Doppler parameters.  ------------------------------------------------------------------- Aortic valve:   Trileaflet; normal thickness leaflets. Mobility was not restricted.  Doppler:   Transvalvular velocity was within the normal range. There was no stenosis. There was no regurgitation.   ------------------------------------------------------------------- Aorta:  Aortic root: The aortic root was normal in size.  ------------------------------------------------------------------- Mitral valve:   Structurally normal valve.   Mobility was not restricted.  Doppler:  Transvalvular velocity was within the normal range. There was no evidence for stenosis. There was no regurgitation.  ------------------------------------------------------------------- Left atrium:  The atrium was mildly dilated.  ------------------------------------------------------------------- Right ventricle:  The cavity size was normal. Wall thickness was normal. Systolic function was normal.  ------------------------------------------------------------------- Pulmonic valve:    Doppler:  Transvalvular velocity was within the normal range. There was no evidence for stenosis.  ------------------------------------------------------------------- Tricuspid valve:   Structurally normal valve.    Doppler: Transvalvular velocity was within the normal range. There was no regurgitation.  ------------------------------------------------------------------- Pulmonary artery:   The main pulmonary artery was normal-sized. Systolic pressure was within the normal range.  ------------------------------------------------------------------- Right atrium:  The atrium was normal in size.  ------------------------------------------------------------------- Pericardium:  There was no pericardial effusion.  ------------------------------------------------------------------- Systemic veins: Inferior vena cava: The vessel was normal in size.   Patient continues to use CPAP , compliant at 9 cm water with 3 cm EPR.  Sleeps much deeper, more sleep quality. Uses nasal pillow with hose on the top.   The  patient was advised of the nature of the diagnosed disorder , the treatment options and the  risks for general health and wellness arising from not treating the condition.  I spent more than 45 minutes of face to face time with the patient. Greater than 50% of time was spent in counseling and coordination of care. We have discussed the diagnosis and differential and I answered the patient's questions.    Plan:  Treatment plan and additional workup :  Resume ASA, stay active.   Prescription for Nasacort or an equivalent generic, the patient was  placed on 9 cm CPAP with EPR, would like a slight increase in pressure, residual AHi is 2.0.  Rv in 3 month;  Cardiac monitor for 3 days for a fib monitoring.  His stroke was likely lacunar from HTN, not embolic.   [name], MD 1/68/3729, 0:21 AM  Certified in Neurology by [employer] Certified in [place] by [facility] [address], [place], [place] [address]

## 2018-05-28 ENCOUNTER — Ambulatory Visit: Payer: BC Managed Care – PPO | Admitting: Family Medicine

## 2018-05-28 ENCOUNTER — Encounter: Payer: Self-pay | Admitting: Family Medicine

## 2018-05-28 VITALS — BP 136/90 | HR 76 | Temp 97.9°F | Resp 14 | Ht 73.0 in | Wt 198.0 lb

## 2018-05-28 DIAGNOSIS — I6381 Other cerebral infarction due to occlusion or stenosis of small artery: Secondary | ICD-10-CM

## 2018-05-28 DIAGNOSIS — I10 Essential (primary) hypertension: Secondary | ICD-10-CM | POA: Diagnosis not present

## 2018-05-28 MED ORDER — HYDROCHLOROTHIAZIDE 25 MG PO TABS: 25.0000 mg | ORAL_TABLET | Freq: Every day | ORAL | 3 refills | Status: DC

## 2018-05-28 MED ORDER — ATORVASTATIN CALCIUM 40 MG PO TABS: 40.0000 mg | ORAL_TABLET | Freq: Every day | ORAL | 3 refills | Status: DC

## 2018-05-28 NOTE — Progress Notes (Signed)
Subjective:    Patient ID: Justin [name], male    DOB: 03-24-1956, 62 y.o.   MRN: [id number]  HPI Unfortunately, the patient recently suffered a stroke and was admitted to the hospital.  I have copied relevant portions of the discharge summary below and included them for my reference.:  Admit date: 04/13/2018 Discharge date: 04/15/2018 Recommendations for Outpatient Follow-up:  1. Follow up with PCP in 1-2 weeks 2. Please obtain BMP/CBC in one week Please follow up with neurology as recommended.   Brief/Interim Summary: Justin [name] a 62 y.o.malewith a history of HTN, OSA with CPAP, anxietyreported numbness of the right face, right upper extremity and right lower extremity since Wednesday. He was admitted for evaluation of stroke.  Discharge Diagnoses:  Principal Problem:   Right facial numbness Active Problems:   Hyperglycemia   Hypertension   Non-recurrent unilateral inguinal hernia without obstruction or gangrene   Cerebral embolism with cerebral infarction  Right face/ upper extremity numbness: Right sided sensory deficits persistent.  CT head negative.  MRI BRAIN showedAcute lacunar infarct in the lateral Left thalamus. No associated hemorrhage or mass effect.Negative intracranialand neckMRA. Echocardiogram not significant for cardiac source of thrombus or embolism.  hgba1c is 5.2.  LDL is 88.  Continue with aspirin 325 mg daily and statin on discharge.  Neurology consulted and therapy evals  Recommending outpatient physical and occupational therapy.  + Medication List    STOP taking these medications   ALEVE 220 MG tablet Generic drug:  naproxen sodium   aspirin EC 81 MG tablet Replaced by:  aspirin 325 MG tablet     TAKE these medications   aspirin 325 MG tablet Take 1 tablet (325 mg total) by mouth daily. Replaces:  aspirin EC 81 MG tablet   atorvastatin 40 MG tablet Commonly known as:  LIPITOR Take 1 tablet (40 mg total) by mouth daily  at 6 PM.   BIOFREEZE ROLL-ON EX Apply 1 application topically daily as needed (for knee pain).   DELZICOL 400 MG Cpdr DR capsule Generic drug:  Mesalamine Take 800 mg by mouth 2 (two) times daily.   losartan 100 MG tablet Commonly known as:  COZAAR TAKE ONE (1) TABLET BY MOUTH EVERY DAY   vitamin C 500 MG tablet Commonly known as:  ASCORBIC ACID Take 500 mg by mouth daily.   Vitamin D3 2000 units Tabs Take 2,000 Units by mouth daily.    Patient is here today for follow-up.  As stated above, he suffered a lacunar infarct in the left thalamus.  He continues to have residual right facial numbness and right arm numbness.  The numbness in his right leg is improving slightly.  He has no weakness in his extremities.  He has no a aphasia or word finding difficulties or memory loss.  He is currently taking aspirin 325 mg a day, Lipitor 40 mg a day, and losartan 100 mg a day.  His blood pressure here today is elevated 150/100 however he is been monitoring his blood pressure at home and his blood pressure today at home was 142/88.  He is received similar readings throughout the last week.  He has had no further strokelike symptoms or TIA symptoms.  Overall he is doing well aside from some pain in his right hand at the DIP and PIP joints most likely due to osteoarthritis.  At that time, my plan was: Patient is currently taking 325 mg aspirin.  He has a follow-up appointment scheduled to see neurology  in 4 weeks.  At that time, I will defer to the neurologist but I anticipate that aspirin will be decreased from 325 mg a day to 81 mg a day.  When the patient had his lacunar infarct, he was on no antiplatelet agent as he had just stopped the medication prior to surgery.  Therefore I do not see this is a failure of aspirin and I believe 81 milligrams a day would be sufficient.  I explained to the patient that he should remain on high-dose statin indefinitely despite having relatively good cholesterol due  to its pleiotropic effects at prevention of stroke and heart attack.  Ideally I would like to see his blood pressure in the 130s over 80s however he is just recently suffered a lacunar infarct.  Therefore I recommended that we allow his blood pressure to remain slightly elevated in the 140s over 90s for at least the first few weeks.  After that, if his blood pressure continues to remain elevated in the 140s over 90s, I would add hydrochlorothiazide with an ultimate goal blood pressure of 130/80.  Patient will call back with his blood pressure in 1 week.  I will obtain a CBC and a CMP today  05/28/18 Patient is here today for follow-up.  His blood pressure has been elevated at home.  His average blood pressure is been 145-155/90-95.  He is compliant with his losartan.  He denies any symptoms.  He denies any chest pain shortness of breath or dyspnea on exertion.  Recently saw his neurologist and at her office it was elevated at 093 systolic over 235.  He is currently taking aspirin 325 mg a day for secondary prevention of stroke.  Past Medical History:  Diagnosis Date  . Anxiety   . Diarrhea   . Hypertension   . OSA (obstructive sleep apnea)    cpap 9 cm H2O   Past Surgical History:  Procedure Laterality Date  . BIOPSY  06/21/2017   Procedure: BIOPSY;  Surgeon: Rogene Houston, MD;  Location: [facility];  Service: Endoscopy;;  rectal, righta and left colon;  . COLONOSCOPY N/A 06/21/2017   Procedure: COLONOSCOPY;  Surgeon: Rogene Houston, MD;  Location: [facility];  Service: Endoscopy;  Laterality: N/A;  1:25  . INGUINAL HERNIA REPAIR Right 04/04/2018   Procedure: HERNIA REPAIR INGUINAL ADULT WITH MESH;  Surgeon: Aviva Signs, MD;  Location: [facility];  Service: General;  Laterality: Right;  . KNEE SURGERY     tore mcl in high school (70's)   Current Outpatient Medications on File Prior to Visit  Medication Sig Dispense Refill  . aspirin 325 MG tablet Take 1 tablet (325 mg total) by mouth  daily. 30 tablet 0  . Cholecalciferol (VITAMIN D3) 2000 units TABS Take 2,000 Units by mouth daily.    . diclofenac sodium (VOLTAREN) 1 % GEL Apply 2 g topically 4 (four) times daily. 100 g 1  . losartan (COZAAR) 100 MG tablet TAKE ONE (1) TABLET BY MOUTH EVERY DAY 90 tablet 1  . Menthol, Topical Analgesic, (BIOFREEZE ROLL-ON EX) Apply 1 application topically daily as needed (for knee pain).    . Mesalamine (DELZICOL) 400 MG CPDR DR capsule Take 800 mg by mouth 2 (two) times daily.    . vitamin C (ASCORBIC ACID) 500 MG tablet Take 500 mg by mouth daily.     No current facility-administered medications on file prior to visit.    No Known Allergies Social History   Socioeconomic  History  . Marital status: Married    Spouse name: Not on file  . Number of children: Not on file  . Years of education: Not on file  . Highest education level: Not on file  Occupational History  . Not on file  Social Needs  . Financial resource strain: Not on file  . Food insecurity:    Worry: Not on file    Inability: Not on file  . Transportation needs:    Medical: Not on file    Non-medical: Not on file  Tobacco Use  . Smoking status: Never Smoker  . Smokeless tobacco: Never Used  Substance and Sexual Activity  . Alcohol use: No    Alcohol/week: 0.0 oz  . Drug use: No  . Sexual activity: Not on file  Lifestyle  . Physical activity:    Days per week: Not on file    Minutes per session: Not on file  . Stress: Not on file  Relationships  . Social connections:    Talks on phone: Not on file    Gets together: Not on file    Attends religious service: Not on file    Active member of club or organization: Not on file    Attends meetings of clubs or organizations: Not on file    Relationship status: Not on file  . Intimate partner violence:    Fear of current or ex partner: Not on file    Emotionally abused: Not on file    Physically abused: Not on file    Forced sexual activity: Not on file    Other Topics Concern  . Not on file  Social History Narrative  . Not on file   Family History  Problem Relation Age of Onset  . Tuberculosis Mother   . Stroke Mother   . Heart disease Father        died at 8  . Bradycardia Brother        pacemaker  . Cancer Paternal Uncle   . Mental illness Paternal Grandfather        suicide at 55  . Colon cancer Neg Hx       Review of Systems  All other systems reviewed and are negative.      Objective:   Physical Exam  Constitutional: He is oriented to person, place, and time. He appears well-developed and well-nourished. No distress.  HENT:  Head: Normocephalic and atraumatic.  Right Ear: External ear normal.  Left Ear: External ear normal.  Nose: Nose normal.  Mouth/Throat: Oropharynx is clear and moist. No oropharyngeal exudate.  Eyes: Pupils are equal, round, and reactive to light. Conjunctivae and EOM are normal. Right eye exhibits no discharge. Left eye exhibits no discharge. No scleral icterus.  Neck: Normal range of motion. Neck supple. No JVD present. No tracheal deviation present. No thyromegaly present.  Cardiovascular: Normal rate, regular rhythm, normal heart sounds and intact distal pulses. Exam reveals no gallop and no friction rub.  No murmur heard. Pulmonary/Chest: Effort normal and breath sounds normal. No stridor. No respiratory distress. He has no wheezes. He has no rales. He exhibits no tenderness.  Abdominal: Soft. Bowel sounds are normal. He exhibits no distension and no mass. There is no tenderness. There is no rebound and no guarding.  Musculoskeletal: Normal range of motion. He exhibits no edema, tenderness or deformity.  Lymphadenopathy:    He has no cervical adenopathy.  Neurological: He is alert and oriented to person, place, and time. He  has normal reflexes. No cranial nerve deficit. He exhibits normal muscle tone. Coordination normal.  Skin: Skin is warm. No rash noted. He is not diaphoretic. No  erythema. No pallor.  Psychiatric: He has a normal mood and affect. His behavior is normal. Judgment and thought content normal.  Vitals reviewed.         Assessment & Plan:  History of lacunar infarct, hypertension, hyperlipidemia.  Add hydrochlorothiazide 25 mg a day to losartan and recheck blood pressure in 1 month.  Return fasting in September for a CBC, CMP, and fasting lipid panel.  Continue Lipitor and aspirin for secondary stroke prevention.  I did recommend switching to a baby aspirin after the patient has completed 3 months of aspirin 325 mg a day.

## 2018-06-05 ENCOUNTER — Ambulatory Visit (INDEPENDENT_AMBULATORY_CARE_PROVIDER_SITE_OTHER): Payer: BC Managed Care – PPO

## 2018-06-05 ENCOUNTER — Other Ambulatory Visit: Payer: Self-pay | Admitting: Neurology

## 2018-06-05 DIAGNOSIS — I6381 Other cerebral infarction due to occlusion or stenosis of small artery: Secondary | ICD-10-CM

## 2018-06-05 DIAGNOSIS — I639 Cerebral infarction, unspecified: Secondary | ICD-10-CM | POA: Diagnosis not present

## 2018-06-05 DIAGNOSIS — I4891 Unspecified atrial fibrillation: Secondary | ICD-10-CM

## 2018-06-28 ENCOUNTER — Telehealth: Payer: Self-pay | Admitting: *Deleted

## 2018-06-28 NOTE — Telephone Encounter (Addendum)
Received monitor from [employer] showing SR with 5 beat run of Vtach on 8/21 at 3:29PM CST.  Attempted to contact pt.  Left message to call our office or Neuro (they ordered monitor). Shown to Dr. Johnsie Cancel, DOD.  No changes, not significant.

## 2018-06-29 NOTE — Telephone Encounter (Signed)
Spoke with the patient's wife (DPR) regarding a transmission on 8/21 @ 4:30p of 5 beats of V-tach.  She said the patient did not mention any physical symptoms at that time, as to that is the time that he returns from work.  She recalls that the patient returned home and took a nap that day.  I told her to have him call if he recalls anything.

## 2018-06-29 NOTE — Telephone Encounter (Signed)
Follow up  Patient is returning your call from yesterday.

## 2018-07-11 ENCOUNTER — Other Ambulatory Visit: Payer: Self-pay | Admitting: Neurology

## 2018-07-25 ENCOUNTER — Telehealth: Payer: Self-pay | Admitting: Neurology

## 2018-07-25 NOTE — Telephone Encounter (Signed)
Called and spoke with the patient's wife about the results of the cardiac event monitor. She was already aware of the events that was shown and did confirm that he had no symptoms during these events. I advised the patients wife tht Dr Gillian Shields was the reading MD of the report and he recommends the patient start metoporol 25 mg daily. I informed her that Dr Brett Fairy had sent a copy of this report to Dr Dennard Schaumann informing of this recommendation and asking if Dr Dennard Schaumann would like to take this medication recommendation on. Patient's wife states that she will touch base with Dr Samella Parr office.

## 2018-07-25 NOTE — Telephone Encounter (Signed)
-----   Message from [name], MD sent at 07/24/2018  5:49 PM EDT ----- Will Dr. Dennard Schaumann start the recommended Toprol

## 2018-07-26 ENCOUNTER — Other Ambulatory Visit: Payer: Self-pay | Admitting: Family Medicine

## 2018-07-26 MED ORDER — METOPROLOL SUCCINATE ER 25 MG PO TB24
25.0000 mg | ORAL_TABLET | Freq: Every day | ORAL | 3 refills | Status: DC
Start: 2018-07-26 — End: 2019-08-29

## 2018-08-23 ENCOUNTER — Ambulatory Visit: Payer: BC Managed Care – PPO | Admitting: Adult Health

## 2018-08-23 ENCOUNTER — Encounter: Payer: Self-pay | Admitting: Adult Health

## 2018-08-23 VITALS — BP 135/89 | HR 3 | Ht 73.0 in | Wt 195.6 lb

## 2018-08-23 DIAGNOSIS — I10 Essential (primary) hypertension: Secondary | ICD-10-CM | POA: Diagnosis not present

## 2018-08-23 DIAGNOSIS — R413 Other amnesia: Secondary | ICD-10-CM | POA: Diagnosis not present

## 2018-08-23 DIAGNOSIS — I639 Cerebral infarction, unspecified: Secondary | ICD-10-CM

## 2018-08-23 DIAGNOSIS — I6381 Other cerebral infarction due to occlusion or stenosis of small artery: Secondary | ICD-10-CM

## 2018-08-23 DIAGNOSIS — E785 Hyperlipidemia, unspecified: Secondary | ICD-10-CM | POA: Diagnosis not present

## 2018-08-23 NOTE — Progress Notes (Signed)
Provider:  Larey [name], [name]  Primary Care Physician:  Justin Frizzle, MD  Reason for visit: Stroke follow-up   Chief Complaint  Patient presents with  . Follow-up    Stroke follow up room in back hallway pt with Justin [name] his wife, sees Dr. Brett [name] for sleep apnea    HPI:  Justin [name] is a 62 y.o. male ,   Interval history 08/23/2018: Patient returns today for stroke follow-up visit.  He did undergo 30-day cardiac monitor which did show episode of V. tach with heart rate 178 but negative for atrial fibrillation.  Upon initial evaluation in the ED, he reported numbness of the right face, right upper extremity and right lower extremity.  Patient does state that he continues to have right upper extremity numbness that is constant with a "stiff and tight feeling".  He has continued to work despite his continued numbness but does have intermittent difficulties as he is right-hand dominant.  He also does have some memory concerns stating that he is not as "sharp" as he previously was and will ask the same questions or will forget certain directions.  He continues to take aspirin without side effects of bleeding or bruising.  Continues to take Lipitor without side effects myalgias.  Blood pressure today satisfactory 135/89.  He does continue to state compliance with CPAP but has been finding himself taking the mask off in the middle the night.  He does have a follow-up in this office for OSA management on 09/13/2018.  No further concerns at this time.  Denies new or worsening stroke/TIA symptoms.  05/24/2018 visit CD: Seen in STROKE follow up -[name] is a 62 year old male patient whom I had initially seen earlier this year for evaluation of sleep apnea unfortunately the patient underwent a right-sided inguinal hernia repair for which she had to discontinue his full size aspirin 5 days prior.  Also aspirin was removed within 2 or 3 days post surgery he did suffer a stroke.  And his main  symptom was right-sided numbness and clumsiness sensory loss of the right dominant hand.  I see him today after he has undergone a stroke work-up in the hospital which included a head CT without contrast on 7 June, an MRI MRA of head and neck, the findings were an acute lacunar infarct in the left lateral thalamus there was no hemorrhage, he had negative abnormalities he was negative for abnormalities in the vascular tree his neck MRA showed mild atherosclerosis at the origins of the left internal carotid artery and right subclavian artery but not significant stenosis and he had very mild for age nonspecific white matter signal changes these are commonly seen the small vessel disease there was no brain atrophy noted.  He had presented to the hospital ED with elevated blood pressures which may explain the lacunar appearance of a previous stroke.  Head CT was negative.  Onset of symptoms was on 13 April 2018 he was evaluated by Justin [name] neurologist on-call, who also mentioned that the patient has a history of obstructive sleep apnea and is currently treated at 9 cmH2O pressure.  The patient restarted on aspirin metabolic panels were normal his creatinine was 1.05 his BUN 24 which may indicate that he was slightly dehydrated, his CBC was normal but that the count was 9.6K hemoglobin 15.1 hematocrit 50.1 without any evidence of anemia normal platelet count 350 3K.  Cardiac enzymes were negative his total cholesterol was actually  rather low at 133 ng but is good "" cholesterol HDL was only 30.  He finished OT and PT- he is awaiting surgery for TKR on the left knee. Dr Justin [name] follows. I would like for him to have 2-3 month of daily exercise 30 minutes, walking or biking, and some weight training. Control BP and cholesterol. Diet adjustment.  More fruit and vegetables.     Review of Systems: Out of a complete 14 system review, the patient complains of only the following symptoms, and all other reviewed systems  are negative. No complaints    Social History   Socioeconomic History  . Marital status: Married    Spouse name: Not on file  . Number of children: Not on file  . Years of education: Not on file  . Highest education level: Not on file  Occupational History  . Not on file  Social Needs  . Financial resource strain: Not on file  . Food insecurity:    Worry: Not on file    Inability: Not on file  . Transportation needs:    Medical: Not on file    Non-medical: Not on file  Tobacco Use  . Smoking status: Never Smoker  . Smokeless tobacco: Never Used  Substance and Sexual Activity  . Alcohol use: No    Alcohol/week: 0.0 standard drinks  . Drug use: No  . Sexual activity: Not on file  Lifestyle  . Physical activity:    Days per week: Not on file    Minutes per session: Not on file  . Stress: Not on file  Relationships  . Social connections:    Talks on phone: Not on file    Gets together: Not on file    Attends religious service: Not on file    Active member of club or organization: Not on file    Attends meetings of clubs or organizations: Not on file    Relationship status: Not on file  . Intimate partner violence:    Fear of current or ex partner: Not on file    Emotionally abused: Not on file    Physically abused: Not on file    Forced sexual activity: Not on file  Other Topics Concern  . Not on file  Social History Narrative  . Not on file    Family History  Problem Relation Age of Onset  . Tuberculosis Mother   . Stroke Mother   . Heart disease Father        died at 62  . Bradycardia Brother        pacemaker  . Stroke Brother   . Cancer Paternal Uncle   . Mental illness Paternal Grandfather        suicide at 55  . Colon cancer Neg Hx     Past Medical History:  Diagnosis Date  . Anxiety   . Diarrhea   . Hypertension   . OSA (obstructive sleep apnea)    cpap 9 cm H2O  . Stroke [facility])     Past Surgical History:  Procedure Laterality Date  .  BIOPSY  06/21/2017   Procedure: BIOPSY;  Surgeon: Justin Houston, MD;  Location: [facility];  Service: Endoscopy;;  rectal, righta and left colon;  . COLONOSCOPY N/A 06/21/2017   Procedure: COLONOSCOPY;  Surgeon: Justin Houston, MD;  Location: [facility];  Service: Endoscopy;  Laterality: N/A;  1:25  . INGUINAL HERNIA REPAIR Right 04/04/2018   Procedure: HERNIA REPAIR INGUINAL ADULT WITH  MESH;  Surgeon: Aviva Signs, MD;  Location: [facility];  Service: General;  Laterality: Right;  . KNEE SURGERY     tore mcl in high school (70's)    Current Outpatient Medications  Medication Sig Dispense Refill  . aspirin 325 MG tablet Take 1 tablet (325 mg total) by mouth daily. 30 tablet 0  . atorvastatin (LIPITOR) 40 MG tablet Take 1 tablet (40 mg total) by mouth daily at 6 PM. 90 tablet 3  . Cholecalciferol (VITAMIN D3) 2000 units TABS Take 2,000 Units by mouth daily.    . hydrochlorothiazide (HYDRODIURIL) 25 MG tablet Take 1 tablet (25 mg total) by mouth daily. 90 tablet 3  . losartan (COZAAR) 100 MG tablet TAKE ONE (1) TABLET BY MOUTH EVERY DAY 90 tablet 1  . Menthol, Topical Analgesic, (BIOFREEZE ROLL-ON EX) Apply 1 application topically daily as needed (for knee pain).    . Mesalamine (DELZICOL) 400 MG CPDR DR capsule Take 800 mg by mouth 2 (two) times daily.    . metoprolol succinate (TOPROL-XL) 25 MG 24 hr tablet Take 1 tablet (25 mg total) by mouth daily. 90 tablet 3  . vitamin C (ASCORBIC ACID) 500 MG tablet Take 500 mg by mouth daily.     No current facility-administered medications for this visit.     Allergies as of 08/23/2018  . (No Known Allergies)    Vitals: BP 135/89 (BP Location: Right Arm, Patient Position: Sitting, Cuff Size: Normal)   Pulse (!) 3   Ht 6' 1"  (1.854 m)   Wt 195 lb 9.6 oz (88.7 kg)   BMI 25.81 kg/m  Last Weight:  Wt Readings from Last 1 Encounters:  08/23/18 195 lb 9.6 oz (88.7 kg)   FAO:ZHYQ mass index is 25.81 kg/m.     Last Height:   Ht Readings  from Last 1 Encounters:  08/23/18 6' 1"  (1.854 m)    Physical exam:  General: The patient is awake, alert and appears not in acute distress. The patient is well groomed. Head: Normocephalic, atraumatic.  Cardiovascular:  Regular rate and rhythm , without  murmurs or carotid bruit, and without distended neck veins. Respiratory: Lungs are clear to auscultation. Skin:  Without evidence of edema, or rash Trunk: BMI is 25, The patient's posture is erect    Neurologic exam : The patient is awake and alert, oriented to place and time.   Attention span & concentration ability appears normal.  He has trouble recalling names.  Speech is fluent,  Without  dysarthria, dysphonia or aphasia.  Mood and affect are appropriate.  Cranial nerves: Pupils are equal and briskly reactive to light. Funduscopic exam without evidence of pallor or edema- retinal tear laser scar in the right eye. Floaters.  . Extraocular movements  in vertical and horizontal planes intact and but with a mild  Nystagmus- not just at endpoint. Visual fields by finger perimetry are intact. Hearing to finger rub intact. Facial sensation to fine touch decreased lower right face  Decreased nasolabial fld on the right. Facial motor strength is symmetric and tongue and uvula move midline. Shoulder shrug was symmetrical.   Motor exam:  Normal tone, muscle bulk and symmetric strength in all extremities.   Sensory: decreased sensation right upper and lower extremity compared to left upper and lower extremity Coordination: Rapid alternating movements in the fingers/hands were fast  normal. Finger-to-nose maneuver normal without evidence of ataxia, dysmetria but a mild action tremor. He noted changed  Handwriting.  Gait and station: Patient  walks without assistive device and is able unassisted to climb up to the exam table. Strength within normal limits.  Stance is stable and normal.   Toe stand deferred  .Tandem gait is now slightly fragmented.    Deep tendon reflexes: in the  upper and lower extremities are symmetric, upgoing babinski on the right .     IMAGING:  MR BRAIN WO CONTRAST MR MRA HEAD WO CONTRAST MR MRA NECK W WO CONTRAST 04/14/18 IMPRESSION: 1. Acute lacunar infarct in the lateral Left thalamus. No associated hemorrhage or mass effect. 2.  Negative intracranial MRA. 3. Negative Neck MRA aside for minimal to mild atherosclerosis at the origins of the Left ICA and Right subclavian artery; no significant stenosis. 4. Mild for age underlying nonspecific cerebral white matter signal changes, most commonly due to chronic small vessel disease.  ECHOCARDIOGRAM 04/14/2018 Study Conclusions - Left ventricle: The cavity size was normal. There was mild   concentric hypertrophy. Systolic function was normal. The   estimated ejection fraction was in the range of 55% to 60%. Wall   motion was normal; there were no regional wall motion   abnormalities. Doppler parameters are consistent with abnormal   left ventricular relaxation (grade 1 diastolic dysfunction). - Left atrium: The atrium was mildly dilated.    Assessment: Locke Barrell is a 62 year old male with recent left thalamic infarct in 04/14/2018 secondary to small vessel disease along with being off of aspirin during hernia surgery.  Vascular risk factors include HTN, HLD and OSA.  He is followed in this office by Dr. Brett [name] for sleep apnea management.  Patient is being seen today for follow-up visit and does have continued right upper extremity numbness/tingling but otherwise has been stable from a stroke standpoint.    Plan:   -Continue aspirin 325 mg daily  and atorvastatin 40 mg for secondary stroke prevention -F/u with PCP regarding your HLD and HTN management -Continue compliance with CPAP for OSA management along with following with Dr. Brett [name] -continue to monitor BP at home -Advised patient to do mind exercises which were reviewed during visit and can  consider possible need for cognitive therapy in the future -Advised to continue to stay active and maintain a healthy diet -Maintain strict control of hypertension with blood pressure goal below 130/90, diabetes with hemoglobin A1c goal below 6.5% and cholesterol with LDL cholesterol (bad cholesterol) goal below 70 mg/dL. I also advised the patient to eat a healthy diet with [name] of whole grains, cereals, fruits and vegetables, exercise regularly and maintain ideal body weight.  Follow up with Hoyle Sauer, NP on 09/13/2018 for OSA management or call earlier if needed Patient stable from stroke standpoint and as he continues to be followed in this office for OSA management, he does not need to schedule follow-up appointment with me at this time.  He was advised to call with questions, concerns or need of future follow-up appointment regarding stroke.   Venancio Poisson, AGNP-BC  [facility] [address] [place], [place] [address]  Phone [phone] Fax [phone] Note: This document was prepared with digital dictation and possible smart phrase technology. Any transcriptional errors that result from this process are unintentional.

## 2018-08-23 NOTE — Patient Instructions (Signed)
Continue aspirin 325 mg daily  and lipitor  for secondary stroke prevention  Continue to follow up with PCP regarding cholesterol and blood pressure management   Do mind exercises and if needed, we can place referral for speech therapy (cognitive therapy)  Continue to stay active and maintain a healthy diet  Continue to monitor blood pressure at home  Maintain strict control of hypertension with blood pressure goal below 130/90, diabetes with hemoglobin A1c goal below 6.5% and cholesterol with LDL cholesterol (bad cholesterol) goal below 70 mg/dL. I also advised the patient to eat a healthy diet with plenty of whole grains, cereals, fruits and vegetables, exercise regularly and maintain ideal body weight.         Thank you for coming to see [place] at [facility]. I hope we have been able to provide you high quality care today.  You may receive a patient satisfaction survey over the next few weeks. We would appreciate your feedback and comments so that we may continue to improve ourselves and the health of our patients.

## 2018-08-27 NOTE — Progress Notes (Signed)
I agree with the above plan 

## 2018-09-11 ENCOUNTER — Encounter: Payer: Self-pay | Admitting: Nurse Practitioner

## 2018-09-12 NOTE — Progress Notes (Signed)
[facility]  PATIENT: Justin [name] DOB: 04-Jun-1956   REASON FOR VISIT: Follow-up for obstructive sleep apnea newly diagnosed with initial CPAP HISTORY FROM: Patient    HISTORY OF PRESENT ILLNESS:UPDATE 11/7/2019CM Justin [name], 62 year old male returns for follow-up for obstructive sleep apnea here for CPAP compliance.  He has history of stroke in June 2019.  He just had a stroke follow-up in the clinic last month.  He states he still getting used to his CPAP machine.  Wife says he no longer snores she can see improvements, he seems more rested when he uses his CPAP.  He wakes up and has taken the mask off.  CPAP compliance dated 08/13/2018-09/11/2018 shows compliance of 4 hours at 73% for 22 days less than 4 hours 7 days at 23% for combined days 29 out of 30.  Average usage 5 hours 8 minutes.  Set pressure 9 cm.  EPR level 3 leak 95th percentile 46.1 AHI 1.1 ESS 7 he returns for reevaluation 05/24/18 CD Justin [name] is a 62 y.o. male ,  Seen in STROKE follow up -[name] is a 62 year old male patient whom I had initially seen earlier this year for evaluation of sleep apnea unfortunately the patient underwent a right-sided inguinal hernia repair for which she had to discontinue his full size aspirin 5 days prior.  Also aspirin was removed within 2 or 3 days post surgery he did suffer a stroke.  And his main symptom was right-sided numbness and clumsiness sensory loss of the right dominant hand.  I see him today after he has undergone a stroke work-up in the hospital which included a head CT without contrast on 7 June, an MRI MRA of head and neck, the findings were an acute lacunar infarct in the left lateral thalamus there was no hemorrhage, he had negative abnormalities he was negative for abnormalities in the vascular tree his neck MRA showed mild atherosclerosis at the origins of the left internal carotid artery and right subclavian artery but not significant stenosis and he  had very mild for age nonspecific white matter signal changes these are commonly seen the small vessel disease there was no brain atrophy noted.  He had presented to the history good to see you in which her birthdate is the only thing I do not like this is cold weather but other than that I am doing good rehab that lasted route had cantaloupes planted and had a 15 there were all about that size all he needed was some brain because I have tried to water in the city water late on and sure they will die because they could not get any water and always irrigated and overstated and verbalized hospital ED with elevated blood pressures which may explain the lacunar appearance of a previous stroke.  Head CT was negative.  Onset of symptoms was on 13 April 2018 he was evaluated by Dr. Kerney Elbe neurologist on-call, who also mentioned that the patient has a history of obstructive sleep apnea and is currently treated at 9 cmH2O pressure.  The patient restarted on aspirin metabolic panels were normal his creatinine was 1.05 his BUN 24 which may indicate that he was slightly dehydrated, his CBC was normal but that the count was 9.6K hemoglobin 15.1 hematocrit 50.1 without any evidence of anemia normal platelet count 350 3K.  Cardiac enzymes were negative his total cholesterol was actually rather low at 133 ng but is good "" cholesterol HDL was only 30.  He finished OT and PT- he is awaiting surgery for TKR on the left knee. Dr Amedeo Plenty follows. I would like for him to have 2-3 month of daily exercise 30 minutes, walking or biking, and some weight training. Control BP and cholesterol. Diet adjustment.  More fruit and vegetables.   UPDATE 5/7/2019CM Justin [name], 62 year old male returns for follow-up with newly diagnosed obstructive sleep apnea here for initial CPAP.  He has been using his machine for approximately 2 months.  He claims he is still getting used to it.  He recently changed his mask.  He sometimes wakes up with his  mask off.  Compliance data dated 02/10/2018-03/11/2018 shows compliance greater than 4 hours at 70%.  Average usage 4 hours 53 minutes.  Pressure set at 9 cm EPR level 3 AHI 5.  Minimal leak ESS 5.  He returns for reevaluation  1/24/19CDKeith PARRISH [name] is a 62 y.o. male , seen here as in a referral from Dr. Dennard Schaumann for a sleep consult.   I have the pleasure of meeting Mr. and Mrs. Albo today, Justin [name] is referred for evaluation of excessive daytime sleepiness, witnessed apneas, and some confusion when aroused from sleep. Justin [name] has no significant medical history, his only diagnosis is a recent hernia and at one time he was evaluated by cardiology with a cardiac echo that returned normal.  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 in the day may be followed by several hours in the office. Justin [name] has developed over the last years a habit of falling asleep in the den watching TV.  He may sleep there already 2 hours in a recliner before he will go over to the bedroom.  The couple at bedtime is now around 1130.  The bedroom is cool quiet and dark, the patient sleeps on a flat mattress, he sleeps on one pillow for head support one pillow between his knees and rest on his side. He will turned to supine sleep soon after he is asleep.  When he snores loudest, and that is when apneas have been witnessed. He will have always 1 sometimes 2 or 3 bathroom breaks depending on fluid intake, his first bathroom break is usually around 3:00 AM. He does not recall vivid or lucid dreams and there is no dream enactment.  He wakes up spontaneously not depending on work requirements, he seems to wake up spontaneously between 3 and 5 AM and often has difficulties to go back to sleep.  In that case he usually goes back to his recliner.  He estimates that  he gets only 4-5 hours of nocturnal sleep and some nights less. He has not fallen asleep at job sites, but once he is no longer physically active or mentally stimulated he struggles to stay awake. On a week end he may take a nap- 20 minute power    REVIEW OF SYSTEMS: Full 14 system review of systems performed and notable only for those listed, all others are neg:  Constitutional: neg  Cardiovascular: neg Ear/Nose/Throat: neg  Skin: neg Eyes: neg Respiratory: neg Gastroitestinal: neg  Hematology/Lymphatic: neg  Endocrine: neg Musculoskeletal: Joint pain  Allergy/Immunology: neg Neurological: neg Psychiatric: neg Sleep : Obstructive sleep apnea with CPAP   ALLERGIES: No Known Allergies  HOME MEDICATIONS: Outpatient Medications Prior to Visit  Medication Sig Dispense Refill  .  aspirin 325 MG tablet Take 1 tablet (325 mg total) by mouth daily. 30 tablet 0  . atorvastatin (LIPITOR) 40 MG tablet Take 1 tablet (40 mg total) by mouth daily at 6 PM. 90 tablet 3  . Cholecalciferol (VITAMIN D3) 2000 units TABS Take 2,000 Units by mouth daily.    . hydrochlorothiazide (HYDRODIURIL) 25 MG tablet Take 1 tablet (25 mg total) by mouth daily. 90 tablet 3  . losartan (COZAAR) 100 MG tablet TAKE ONE (1) TABLET BY MOUTH EVERY DAY 90 tablet 1  . Menthol, Topical Analgesic, (BIOFREEZE ROLL-ON EX) Apply 1 application topically daily as needed (for knee pain).    . Mesalamine (DELZICOL) 400 MG CPDR DR capsule Take 800 mg by mouth 2 (two) times daily.    . metoprolol succinate (TOPROL-XL) 25 MG 24 hr tablet Take 1 tablet (25 mg total) by mouth daily. 90 tablet 3  . vitamin C (ASCORBIC ACID) 500 MG tablet Take 500 mg by mouth daily.     No facility-administered medications prior to visit.     PAST MEDICAL HISTORY: Past Medical History:  Diagnosis Date  . Anxiety   . Diarrhea   . Hypertension   . OSA (obstructive sleep apnea)    cpap 9 cm H2O  . Stroke [facility])     PAST SURGICAL HISTORY: Past  Surgical History:  Procedure Laterality Date  . BIOPSY  06/21/2017   Procedure: BIOPSY;  Surgeon: Rogene Houston, MD;  Location: [facility];  Service: Endoscopy;;  rectal, righta and left colon;  . COLONOSCOPY N/A 06/21/2017   Procedure: COLONOSCOPY;  Surgeon: Rogene Houston, MD;  Location: [facility];  Service: Endoscopy;  Laterality: N/A;  1:25  . INGUINAL HERNIA REPAIR Right 04/04/2018   Procedure: HERNIA REPAIR INGUINAL ADULT WITH MESH;  Surgeon: Aviva Signs, MD;  Location: [facility] ORS;  Service: General;  Laterality: Right;  . KNEE SURGERY     tore mcl in high school (70's)    FAMILY HISTORY: Family History  Problem Relation Age of Onset  . Tuberculosis Mother   . Stroke Mother   . Heart disease Father        died at 74  . Bradycardia Brother        pacemaker  . Stroke Brother   . Cancer Paternal Uncle   . Mental illness Paternal Grandfather        suicide at 80  . Colon cancer Neg Hx     SOCIAL HISTORY: Social History   Socioeconomic History  . Marital status: Married    Spouse name: Not on file  . Number of children: Not on file  . Years of education: Not on file  . Highest education level: Not on file  Occupational History  . Not on file  Social Needs  . Financial resource strain: Not on file  . Food insecurity:    Worry: Not on file    Inability: Not on file  . Transportation needs:    Medical: Not on file    Non-medical: Not on file  Tobacco Use  . Smoking status: Never Smoker  . Smokeless tobacco: Never Used  Substance and Sexual Activity  . Alcohol use: No    Alcohol/week: 0.0 standard drinks  . Drug use: No  . Sexual activity: Not on file  Lifestyle  . Physical activity:    Days per week: Not on file    Minutes per session: Not on file  . Stress: Not on file  Relationships  .  Social connections:    Talks on phone: Not on file    Gets together: Not on file    Attends religious service: Not on file    Active member of club or  organization: Not on file    Attends meetings of clubs or organizations: Not on file    Relationship status: Not on file  . Intimate partner violence:    Fear of current or ex partner: Not on file    Emotionally abused: Not on file    Physically abused: Not on file    Forced sexual activity: Not on file  Other Topics Concern  . Not on file  Social History Narrative  . Not on file     PHYSICAL EXAM  Vitals:   09/13/18 1033  BP: 138/83  Pulse: (!) 57  Weight: 198 lb 12.8 oz (90.2 kg)  Height: 6' 1"  (1.854 m)   Body mass index is 26.23 kg/m.  Generalized: Well developed, in no acute distress  Head: normocephalic and atraumatic,. Oropharynx benign mallopati 4 Neck: Supple, circumference 17.5 Lungs clear Cardiac regular rate and rhythm Musculoskeletal: No deformity  Skin without rash or edema  Neurological examination   Mentation: Alert oriented to time, place, history taking. Attention span and concentration appropriate. Recent and remote memory intact.  Follows all commands speech and language fluent.   Cranial nerve II-XII: .Pupils were equal round reactive to light extraocular movements were full, visual field were full on confrontational test. Facial sensation and strength were normal. hearing was intact to finger rubbing bilaterally. Uvula tongue midline. head turning and shoulder shrug were normal and symmetric.Tongue protrusion into cheek strength was normal. Motor: normal bulk and tone, full strength in the BUE, BLE, Sensory: normal and symmetric to light touch,  Coordination: finger-nose-finger, heel-to-shin bilaterally, no dysmetria Gait and Station: Rising up from seated position without assistance, normal stance,  moderate stride, good arm swing, smooth turning, able to perform tiptoe, and heel walking without difficulty. Tandem gait is steady  DIAGNOSTIC DATA (LABS, IMAGING, TESTING) - I reviewed patient records, labs, notes, testing and imaging myself where  available.  Lab Results  Component Value Date   WBC 10.5 04/19/2018   HGB 15.3 04/19/2018   HCT 43.5 04/19/2018   MCV 90.2 04/19/2018   PLT 447 (H) 04/19/2018      Component Value Date/Time   NA 138 04/19/2018 1257   K 4.5 04/19/2018 1257   CL 102 04/19/2018 1257   CO2 27 04/19/2018 1257   GLUCOSE 83 04/19/2018 1257   BUN 17 04/19/2018 1257   CREATININE 0.98 04/19/2018 1257   CALCIUM 9.9 04/19/2018 1257   PROT 6.8 04/19/2018 1257   ALBUMIN 3.9 04/13/2018 1618   AST 16 04/19/2018 1257   ALT 14 04/19/2018 1257   ALKPHOS 69 04/13/2018 1618   BILITOT 0.7 04/19/2018 1257   GFRNONAA 82 04/19/2018 1257   GFRAA 95 04/19/2018 1257   Lab Results  Component Value Date   CHOL 133 04/14/2018   HDL 30 (L) 04/14/2018   LDLCALC 88 04/14/2018   TRIG 75 04/14/2018   CHOLHDL 4.4 04/14/2018   Lab Results  Component Value Date   HGBA1C 5.2 04/14/2018   No results found for: VITAMINB12 Lab Results  Component Value Date   TSH 1.15 04/28/2017      ASSESSMENT AND PLAN  62 y.o. year old male  has a past medical history of Anxiety, Diarrhea, Hypertension, OSA (obstructive sleep apnea), and Stroke ([place]). here for initial CPAP compliance.  CPAP compliance dated 08/13/2018-09/11/2018 shows compliance of 4 hours at 73% for 22 days less than 4 hours 7 days at 23% for combined days 29 out of 30.  Average usage 5 hours 8 minutes.  Set pressure 9 cm.  EPR level 3 leak 95th percentile 46.1 AHI 1.1 ESS 7   PLAN: CPAP compliance 73% discussed data with patient need to use the machine for longer periods of time Continue same settings Follow-up in 3 months for CPAP compliance Dennie Bible, [facility], [facility], APRN  [facility] [address], [place], [place] [address] [phone]

## 2018-09-13 ENCOUNTER — Ambulatory Visit: Payer: BC Managed Care – PPO | Admitting: Nurse Practitioner

## 2018-09-13 ENCOUNTER — Encounter: Payer: Self-pay | Admitting: Nurse Practitioner

## 2018-09-13 VITALS — BP 138/83 | HR 57 | Ht 73.0 in | Wt 198.8 lb

## 2018-09-13 DIAGNOSIS — G4733 Obstructive sleep apnea (adult) (pediatric): Secondary | ICD-10-CM

## 2018-09-13 DIAGNOSIS — Z9989 Dependence on other enabling machines and devices: Secondary | ICD-10-CM

## 2018-09-13 NOTE — Patient Instructions (Signed)
CPAP compliance 73% Continue same settings Follow-up in 3 months for CPAP compliance

## 2018-09-18 ENCOUNTER — Ambulatory Visit (INDEPENDENT_AMBULATORY_CARE_PROVIDER_SITE_OTHER): Payer: BC Managed Care – PPO | Admitting: Internal Medicine

## 2018-09-18 ENCOUNTER — Encounter (INDEPENDENT_AMBULATORY_CARE_PROVIDER_SITE_OTHER): Payer: Self-pay | Admitting: Internal Medicine

## 2018-09-18 VITALS — BP 120/84 | HR 65 | Temp 98.6°F | Resp 18 | Ht 72.0 in | Wt 198.7 lb

## 2018-09-18 DIAGNOSIS — K5100 Ulcerative (chronic) pancolitis without complications: Secondary | ICD-10-CM

## 2018-09-18 MED ORDER — MESALAMINE 400 MG PO CPDR: 800.0000 mg | DELAYED_RELEASE_CAPSULE | Freq: Two times a day (BID) | ORAL | 11 refills | Status: DC

## 2018-09-18 NOTE — Patient Instructions (Signed)
Notify if you have rectal bleeding or diarrhea.

## 2018-09-18 NOTE — Progress Notes (Signed)
Presenting complaint;  Follow-up for ulcerative colitis.  Database and subjective:  Patient is 62 year old Caucasian male who was evaluated in August last year for over 38-monthhistory of diarrhea negative stool studies.  Colonoscopy revealed pan ulcerative colitis.  Terminal ileum was normal. He was initially treated with prednisone and begun on oral mesalamine. He was last seen in January this year. He states he is doing very well as far as his colitis is concerned.  He has 1 formed stool daily.  He denies abdominal pain melena or rectal bleeding.  He has gained 10 pounds since his last visit.  He states when he developed diarrhea he lost down to 164 pounds.  Before his symptoms started he was around 200 pounds.  Now that he is gained weight he is watching his diet.  He is not having any side effects with oral mesalamine. He tells me that he had a right inguinal hernia [place] in May 2019.  He forgot to go back on aspirin and suffered left CVA.  She is he states he has had remarkable recovery of function.  He now has numbness in his hand and right forearm but strength is back.  He is eClinical biochemistand works full-time. He is now on full dose aspirin.  He very rarely takes Aleve.   Current Medications: Outpatient Encounter Medications as of 09/18/2018  Medication Sig  . aspirin 325 MG tablet Take 1 tablet (325 mg total) by mouth daily.  .Marland Kitchenatorvastatin (LIPITOR) 40 MG tablet Take 1 tablet (40 mg total) by mouth daily at 6 PM.  . Cholecalciferol (VITAMIN D3) 2000 units TABS Take 2,000 Units by mouth daily.  . diphenhydrAMINE (BENADRYL) 25 MG tablet Take 25 mg by mouth. Patient takes at bedtime prn for sleep.  . hydrochlorothiazide (HYDRODIURIL) 25 MG tablet Take 1 tablet (25 mg total) by mouth daily.  .Marland KitchenHYDROcodone-acetaminophen (NORCO/VICODIN) 5-325 MG tablet Take 1 tablet by mouth every 6 (six) hours as needed for moderate pain.  .Marland Kitchenlosartan (COZAAR) 100 MG tablet TAKE ONE (1) TABLET BY MOUTH EVERY  DAY  . Menthol, Topical Analgesic, (BIOFREEZE ROLL-ON EX) Apply 1 application topically daily as needed (for knee pain).  . Mesalamine (DELZICOL) 400 MG CPDR DR capsule Take 800 mg by mouth 2 (two) times daily.  . metoprolol succinate (TOPROL-XL) 25 MG 24 hr tablet Take 1 tablet (25 mg total) by mouth daily.  . naproxen sodium (ALEVE) 220 MG tablet Take 220 mg by mouth as needed.  . vitamin C (ASCORBIC ACID) 500 MG tablet Take 500 mg by mouth daily.   No facility-administered encounter medications on file as of 09/18/2018.      Objective: Blood pressure 120/84, pulse 65, temperature 98.6 F (37 C), temperature source Oral, resp. rate 18, height 6' (1.829 m), weight 198 lb 11.2 oz (90.1 kg). Patient is alert and in no acute distress. Conjunctiva is pink. Sclera is nonicteric Oropharyngeal mucosa is normal. No neck masses or thyromegaly noted. Cardiac exam with regular rhythm normal S1 and S2. No murmur or gallop noted. Lungs are clear to auscultation. Abdomen is symmetrical soft and nontender with organomegaly or masses. No LE edema or clubbing noted.  Labs/studies Results: Lab data from 04/19/2018 reviewed.   WBC 10.5 H&H 15.3 and 43.5 and platelet count 447K.  Glucose 83 BUN 17 and creatinine 0.98 Serum calcium 9.9. Total bilirubin 0.7, AP 79, AST 16, ALT 14, total protein 6.8 and albumin 4.2.   Assessment:  #1.  Pan ulcerative colitis.  He  was diagnosed in August 2018.  He is on oral mesalamine and appears to be in remission.  He is on full dose aspirin and not having any GI GI issues or bleeding.  Once again patient reminded to keep Aleve use to minimum.   Plan:  New prescription for Delzicol 80 mg p.o. twice daily sent to patient's pharmacy with 11 refills. Unless symptoms relapse patient will return for office visit in 1 year.

## 2018-10-09 ENCOUNTER — Other Ambulatory Visit: Payer: Self-pay | Admitting: Family Medicine

## 2018-12-17 ENCOUNTER — Encounter: Payer: Self-pay | Admitting: Nurse Practitioner

## 2018-12-18 NOTE — Progress Notes (Signed)
[facility]  PATIENT: Justin [name] DOB: February 27, 1956   REASON FOR VISIT: Follow-up for obstructive sleep apnea newly diagnosed with  CPAP compliance HISTORY FROM: Patient    HISTORY OF PRESENT ILLNESS:UPDATE 2/12/2020CM Justin [name], 63 year old male returns for follow-up with a history of obstructive sleep apnea here for CPAP compliance.  He has been doing well.  Much less daytime drowsiness.  CPAP data dated 11/18/2018-12/17/2018 shows usage  greater than 4 hours at 93%.  Average usage 5 hours 59 minutes.  Set pressure 9 cm leak 95th percentile 6.8.  AHI 0.7.  He returns for reevaluation   UPDATE 11/7/2019CM Justin [name], 63 year old male returns for follow-up for obstructive sleep apnea here for CPAP compliance.  He has history of stroke in June 2019.  He just had a stroke follow-up in the clinic last month.  He states he still getting used to his CPAP machine.  Wife says he no longer snores she can see improvements, he seems more rested when he uses his CPAP.  He wakes up and has taken the mask off.  CPAP compliance dated 08/13/2018-09/11/2018 shows compliance of 4 hours at 73% for 22 days less than 4 hours 7 days at 23% for combined days 29 out of 30.  Average usage 5 hours 8 minutes.  Set pressure 9 cm.  EPR level 3 leak 95th percentile 46.1 AHI 1.1 ESS 7 he returns for reevaluation 05/24/18 Justin [name] is a 63 y.o. male ,  Seen in STROKE follow up -Justin [name] is a 63 year old male patient whom I had initially seen earlier this year for evaluation of sleep apnea unfortunately the patient underwent a right-sided inguinal hernia repair for which she had to discontinue his full size aspirin 5 days prior.  Also aspirin was removed within 2 or 3 days post surgery he did suffer a stroke.  And his main symptom was right-sided numbness and clumsiness sensory loss of the right dominant hand.  I see him today after he has undergone a stroke work-up in the hospital which included a head  CT without contrast on 7 June, an MRI MRA of head and neck, the findings were an acute lacunar infarct in the left lateral thalamus there was no hemorrhage, he had negative abnormalities he was negative for abnormalities in the vascular tree his neck MRA showed mild atherosclerosis at the origins of the left internal carotid artery and right subclavian artery but not significant stenosis and he had very mild for age nonspecific white matter signal changes these are commonly seen the small vessel disease there was no brain atrophy noted.  He had presented to the history good to see you in which her birthdate is the only thing I do not like this is cold weather but other than that I am doing good rehab that lasted route had cantaloupes planted and had a 15 there were all about that size all he needed was some brain because I have tried to water in the city water late on and sure they will die because they could not get any water and always irrigated and overstated and verbalized hospital ED with elevated blood pressures which may explain the lacunar appearance of a previous stroke.  Head CT was negative.  Onset of symptoms was on 13 April 2018 he was evaluated by Justin [name] neurologist on-call, who also mentioned that the patient has a history of obstructive sleep apnea and is currently treated at 9 cmH2O pressure.  The  patient restarted on aspirin metabolic panels were normal his creatinine was 1.05 his BUN 24 which may indicate that he was slightly dehydrated, his CBC was normal but that the count was 9.6K hemoglobin 15.1 hematocrit 50.1 without any evidence of anemia normal platelet count 350 3K.  Cardiac enzymes were negative his total cholesterol was actually rather low at 133 ng but is good "" cholesterol HDL was only 30.  He finished OT and PT- he is awaiting surgery for TKR on the left knee. Justin [name] follows. I would like for him to have 2-3 month of daily exercise 30 minutes, walking or biking, and  some weight training. Control BP and cholesterol. Diet adjustment.  More fruit and vegetables.   UPDATE 5/7/2019CM Justin [name], 63 year old male returns for follow-up with newly diagnosed obstructive sleep apnea here for initial CPAP.  He has been using his machine for approximately 2 months.  He claims he is still getting used to it.  He recently changed his mask.  He sometimes wakes up with his mask off.  Compliance data dated 02/10/2018-03/11/2018 shows compliance greater than 4 hours at 70%.  Average usage 4 hours 53 minutes.  Pressure set at 9 cm EPR level 3 AHI 5.  Minimal leak ESS 5.  He returns for reevaluation  1/24/19CDKeith DONEL [name] is a 63 y.o. male , seen here as in a referral from Justin [name] for a sleep consult.   I have the pleasure of meeting Justin [name] today, Justin [name] is referred for evaluation of excessive daytime sleepiness, witnessed apneas, and some confusion when aroused from sleep. Justin [name] has no significant medical history, his only diagnosis is a recent hernia and at one time he was evaluated by cardiology with a cardiac echo that returned normal.  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 in the day may be followed by several hours in the office. [name] has developed over the last years a habit of falling asleep in the den watching TV.  He may sleep there already 2 hours in a recliner before he will go over to the bedroom.  The couple at bedtime is now around 1130.  The bedroom is cool quiet and dark, the patient sleeps on a flat mattress, he sleeps on one pillow for head support one pillow between his knees and rest on his side. He will turned to supine sleep soon after he is asleep.  When he snores loudest, and that is when apneas have been witnessed. He will have always 1 sometimes 2 or 3 bathroom  breaks depending on fluid intake, his first bathroom break is usually around 3:00 AM. He does not recall vivid or lucid dreams and there is no dream enactment.  He wakes up spontaneously not depending on work requirements, he seems to wake up spontaneously between 3 and 5 AM and often has difficulties to go back to sleep.  In that case he usually goes back to his recliner.  He estimates that he gets only 4-5 hours of nocturnal sleep and some nights less. He has not fallen asleep at job sites, but once he is no longer physically active or mentally stimulated he struggles to stay awake. On a week end he may take a nap- 20 minute power    REVIEW OF SYSTEMS: Full 14 system review  of systems performed and notable only for those listed, all others are neg:  Constitutional: neg  Cardiovascular: neg Ear/Nose/Throat: neg  Skin: neg Eyes: neg Respiratory: neg Gastroitestinal: neg  Hematology/Lymphatic: neg  Endocrine: neg Musculoskeletal: Joint pain  Allergy/Immunology: neg Neurological: neg Psychiatric: neg Sleep : Obstructive sleep apnea with CPAP   ALLERGIES: No Known Allergies  HOME MEDICATIONS: Outpatient Medications Prior to Visit  Medication Sig Dispense Refill  . aspirin 325 MG tablet Take 1 tablet (325 mg total) by mouth daily. 30 tablet 0  . atorvastatin (LIPITOR) 40 MG tablet Take 1 tablet (40 mg total) by mouth daily at 6 PM. 90 tablet 3  . Cholecalciferol (VITAMIN D3) 2000 units TABS Take 2,000 Units by mouth daily.    . diphenhydrAMINE (BENADRYL) 25 MG tablet Take 25 mg by mouth. Patient takes at bedtime prn for sleep.    . hydrochlorothiazide (HYDRODIURIL) 25 MG tablet Take 1 tablet (25 mg total) by mouth daily. 90 tablet 3  . HYDROcodone-acetaminophen (NORCO/VICODIN) 5-325 MG tablet Take 1 tablet by mouth every 6 (six) hours as needed for moderate pain.    Marland Kitchen losartan (COZAAR) 100 MG tablet TAKE ONE (1) TABLET BY MOUTH EVERY DAY 90 tablet 1  . Menthol, Topical Analgesic,  (BIOFREEZE ROLL-ON EX) Apply 1 application topically daily as needed (for knee pain).    . Mesalamine (DELZICOL) 400 MG CPDR Justin capsule Take 2 capsules (800 mg total) by mouth 2 (two) times daily. 120 capsule 11  . metoprolol succinate (TOPROL-XL) 25 MG 24 hr tablet Take 1 tablet (25 mg total) by mouth daily. 90 tablet 3  . naproxen sodium (ALEVE) 220 MG tablet Take 220 mg by mouth as needed.    . vitamin C (ASCORBIC ACID) 500 MG tablet Take 500 mg by mouth daily.     No facility-administered medications prior to visit.     PAST MEDICAL HISTORY: Past Medical History:  Diagnosis Date  . Anxiety   . Diarrhea   . Hypertension   . OSA (obstructive sleep apnea)    cpap 9 cm H2O  . Stroke [facility])     PAST SURGICAL HISTORY: Past Surgical History:  Procedure Laterality Date  . BIOPSY  06/21/2017   Procedure: BIOPSY;  Surgeon: Rogene Houston, MD;  Location: [facility];  Service: Endoscopy;;  rectal, righta and left colon;  . COLONOSCOPY N/A 06/21/2017   Procedure: COLONOSCOPY;  Surgeon: Rogene Houston, MD;  Location: [facility];  Service: Endoscopy;  Laterality: N/A;  1:25  . INGUINAL HERNIA REPAIR Right 04/04/2018   Procedure: HERNIA REPAIR INGUINAL ADULT WITH MESH;  Surgeon: Aviva Signs, MD;  Location: [facility];  Service: General;  Laterality: Right;  . KNEE SURGERY     tore mcl in high school (70's)    FAMILY HISTORY: Family History  Problem Relation Age of Onset  . Tuberculosis Mother   . Stroke Mother   . Heart disease Father        died at 21  . Bradycardia Brother        pacemaker  . Stroke Brother   . Cancer Paternal Uncle   . Mental illness Paternal Grandfather        suicide at 34  . Colon cancer Neg Hx     SOCIAL HISTORY: Social History   Socioeconomic History  . Marital status: Married    Spouse name: Not on file  . Number of children: Not on file  . Years of education: Not on file  .  Highest education level: Not on file  Occupational History  . Not  on file  Social Needs  . Financial resource strain: Not on file  . Food insecurity:    Worry: Not on file    Inability: Not on file  . Transportation needs:    Medical: Not on file    Non-medical: Not on file  Tobacco Use  . Smoking status: Never Smoker  . Smokeless tobacco: Never Used  Substance and Sexual Activity  . Alcohol use: No    Alcohol/week: 0.0 standard drinks  . Drug use: No  . Sexual activity: Not on file  Lifestyle  . Physical activity:    Days per week: Not on file    Minutes per session: Not on file  . Stress: Not on file  Relationships  . Social connections:    Talks on phone: Not on file    Gets together: Not on file    Attends religious service: Not on file    Active member of club or organization: Not on file    Attends meetings of clubs or organizations: Not on file    Relationship status: Not on file  . Intimate partner violence:    Fear of current or ex partner: Not on file    Emotionally abused: Not on file    Physically abused: Not on file    Forced sexual activity: Not on file  Other Topics Concern  . Not on file  Social History Narrative  . Not on file     PHYSICAL EXAM  Vitals:   12/19/18 0841  BP: 133/82  Pulse: 63  Weight: 199 lb (90.3 kg)  Height: 6' (1.829 m)   Body mass index is 26.99 kg/m.  Generalized: Well developed, in no acute distress  Head: normocephalic and atraumatic,. Oropharynx benign mallopati 4 Neck: Supple, circumference 17.5 Lungs clear Musculoskeletal: No deformity  Skin without rash or edema  Neurological examination   Mentation: Alert oriented to time, place, history taking. Attention span and concentration appropriate. Recent and remote memory intact.  Follows all commands speech and language fluent.   Cranial nerve II-XII: .Pupils were equal round reactive to light extraocular movements were full, visual field were full on confrontational test. Facial sensation and strength were normal. hearing was  intact to finger rubbing bilaterally. Uvula tongue midline. head turning and shoulder shrug were normal and symmetric.Tongue protrusion into cheek strength was normal. Motor: normal bulk and tone, full strength in the BUE, BLE, Sensory: normal and symmetric to light touch,  Coordination: finger-nose-finger, heel-to-shin bilaterally, no dysmetria Gait and Station: Rising up from seated position without assistance, normal stance,  moderate stride, good arm swing, smooth turning, able to perform tiptoe, and heel walking without difficulty. Tandem gait is steady  DIAGNOSTIC DATA (LABS, IMAGING, TESTING) - I reviewed patient records, labs, notes, testing and imaging myself where available.  Lab Results  Component Value Date   WBC 10.5 04/19/2018   HGB 15.3 04/19/2018   HCT 43.5 04/19/2018   MCV 90.2 04/19/2018   PLT 447 (H) 04/19/2018      Component Value Date/Time   NA 138 04/19/2018 1257   K 4.5 04/19/2018 1257   CL 102 04/19/2018 1257   CO2 27 04/19/2018 1257   GLUCOSE 83 04/19/2018 1257   BUN 17 04/19/2018 1257   CREATININE 0.98 04/19/2018 1257   CALCIUM 9.9 04/19/2018 1257   PROT 6.8 04/19/2018 1257   ALBUMIN 3.9 04/13/2018 1618   AST 16 04/19/2018 1257  ALT 14 04/19/2018 1257   ALKPHOS 69 04/13/2018 1618   BILITOT 0.7 04/19/2018 1257   GFRNONAA 82 04/19/2018 1257   GFRAA 95 04/19/2018 1257   Lab Results  Component Value Date   CHOL 133 04/14/2018   HDL 30 (L) 04/14/2018   LDLCALC 88 04/14/2018   TRIG 75 04/14/2018   CHOLHDL 4.4 04/14/2018   Lab Results  Component Value Date   HGBA1C 5.2 04/14/2018   No results found for: VITAMINB12 Lab Results  Component Value Date   TSH 1.15 04/28/2017      ASSESSMENT AND PLAN  63 y.o. year old male  has a past medical history of Anxiety, Diarrhea, Hypertension, OSA (obstructive sleep apnea), and Stroke ([place]). here for  CPAP compliance.Data dated 11/18/2018-12/17/2018 shows usage  greater than 4 hours at 93%.  Average usage  5 hours 59 minutes.  Set pressure 9 cm leak 95th percentile 6.8.  AHI 0.7.   PLAN: CPAP compliance 93% reviewed compliance data with patient Continue same settings Follow-up in yearly  for CPAP compliance Dennie Bible, [facility], [facility], APRN  [facility] [address], [place], [place] [address] [phone]

## 2018-12-19 ENCOUNTER — Encounter: Payer: Self-pay | Admitting: Nurse Practitioner

## 2018-12-19 ENCOUNTER — Ambulatory Visit: Payer: BC Managed Care – PPO | Admitting: Nurse Practitioner

## 2018-12-19 VITALS — BP 133/82 | HR 63 | Ht 72.0 in | Wt 199.0 lb

## 2018-12-19 DIAGNOSIS — G4733 Obstructive sleep apnea (adult) (pediatric): Secondary | ICD-10-CM | POA: Diagnosis not present

## 2018-12-19 DIAGNOSIS — Z9989 Dependence on other enabling machines and devices: Secondary | ICD-10-CM

## 2018-12-19 NOTE — Patient Instructions (Signed)
CPAP compliance 93%  Continue same settings Follow-up in yearly  for CPAP compliance

## 2018-12-20 ENCOUNTER — Encounter: Payer: Self-pay | Admitting: Family Medicine

## 2018-12-20 ENCOUNTER — Ambulatory Visit: Payer: BC Managed Care – PPO | Admitting: Family Medicine

## 2018-12-20 VITALS — BP 112/78 | HR 80 | Temp 98.2°F | Resp 16 | Ht 73.0 in | Wt 196.0 lb

## 2018-12-20 DIAGNOSIS — R509 Fever, unspecified: Secondary | ICD-10-CM | POA: Diagnosis not present

## 2018-12-20 MED ORDER — METRONIDAZOLE 500 MG PO TABS: 500.0000 mg | ORAL_TABLET | Freq: Two times a day (BID) | ORAL | 0 refills | Status: DC

## 2018-12-20 MED ORDER — CIPROFLOXACIN HCL 500 MG PO TABS: 500.0000 mg | ORAL_TABLET | Freq: Two times a day (BID) | ORAL | 0 refills | Status: DC

## 2018-12-20 NOTE — Progress Notes (Signed)
Subjective:    Patient ID: Justin [name], male    DOB: 1956-07-03, 63 y.o.   MRN: [id number]  HPI Patient had hernia surgery earlier this year.  He has a scar in his right inguinal canal.  He has had intermittent sharp pain there before particular if he is working hard or doing heavy lifting.  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  I believe the pain is more likely due to adhesions.  The pain has subsided this morning.  However last night he developed a fever to 99.7 and also had one episode of vomiting.  He states that he has not had any further vomiting this morning although he does feel tired.  He denies any abdominal pain this morning although he is still mildly tender to palpation in the right lower quadrant.  He denies any nausea or vomiting this morning.  He denies any diarrhea.  He denies any melena or hematochezia.  He denies any cough, sore throat, head congestion, rhinorrhea, sore throat, sinus pain, or otalgia.  He denies any dysuria or urgency or frequency or hematuria Past Medical History:  Diagnosis Date  . Anxiety   . Diarrhea   . Hypertension   . OSA (obstructive sleep apnea)    cpap 9 cm H2O  . Stroke [facility])    Past Surgical History:  Procedure Laterality Date  . BIOPSY  06/21/2017   Procedure: BIOPSY;  Surgeon: Rogene Houston, MD;  [facility];  Service: Endoscopy;;  rectal, righta and left colon;  . COLONOSCOPY N/A 06/21/2017   Procedure: COLONOSCOPY;  Surgeon: Rogene Houston, MD;  [facility];  Service: Endoscopy;  Laterality: N/A;  1:25  . INGUINAL HERNIA REPAIR Right 04/04/2018   Procedure: HERNIA REPAIR INGUINAL ADULT WITH MESH;  Surgeon: Aviva Signs, MD;  Location: [facility];  Service: General;  Laterality: Right;  . KNEE SURGERY     tore mcl in high school (70's)   Current Outpatient Medications on File Prior to Visit  Medication Sig Dispense Refill  . aspirin 325 MG tablet  Take 1 tablet (325 mg total) by mouth daily. 30 tablet 0  . atorvastatin (LIPITOR) 40 MG tablet Take 1 tablet (40 mg total) by mouth daily at 6 PM. 90 tablet 3  . Cholecalciferol (VITAMIN D3) 2000 units TABS Take 2,000 Units by mouth daily.    . diphenhydrAMINE (BENADRYL) 25 MG tablet Take 25 mg by mouth. Patient takes at bedtime prn for sleep.    . hydrochlorothiazide (HYDRODIURIL) 25 MG tablet Take 1 tablet (25 mg total) by mouth daily. 90 tablet 3  . HYDROcodone-acetaminophen (NORCO/VICODIN) 5-325 MG tablet Take 1 tablet by mouth every 6 (six) hours as needed for moderate pain.    Marland Kitchen losartan (COZAAR) 100 MG tablet TAKE ONE (1) TABLET BY MOUTH EVERY DAY 90 tablet 1  . Menthol, Topical Analgesic, (BIOFREEZE ROLL-ON EX) Apply 1 application topically daily as needed (for knee pain).    . Mesalamine (DELZICOL) 400 MG CPDR DR capsule Take 2 capsules (800 mg total) by mouth 2 (two) times daily. 120 capsule 11  . metoprolol succinate (TOPROL-XL) 25 MG 24 hr tablet Take 1 tablet (25 mg total) by mouth daily. 90 tablet 3  . naproxen sodium (ALEVE) 220 MG tablet Take 220 mg by mouth as needed.    . vitamin C (ASCORBIC ACID) 500 MG tablet Take 500 mg by mouth daily.  No current facility-administered medications on file prior to visit.    No Known Allergies Social History   Socioeconomic History  . Marital status: Married    Spouse name: Not on file  . Number of children: Not on file  . Years of education: Not on file  . Highest education level: Not on file  Occupational History  . Not on file  Social Needs  . Financial resource strain: Not on file  . Food insecurity:    Worry: Not on file    Inability: Not on file  . Transportation needs:    Medical: Not on file    Non-medical: Not on file  Tobacco Use  . Smoking status: Never Smoker  . Smokeless tobacco: Never Used  Substance and Sexual Activity  . Alcohol use: No    Alcohol/week: 0.0 standard drinks  . Drug use: No  . Sexual  activity: Not on file  Lifestyle  . Physical activity:    Days per week: Not on file    Minutes per session: Not on file  . Stress: Not on file  Relationships  . Social connections:    Talks on phone: Not on file    Gets together: Not on file    Attends religious service: Not on file    Active member of club or organization: Not on file    Attends meetings of clubs or organizations: Not on file    Relationship status: Not on file  . Intimate partner violence:    Fear of current or ex partner: Not on file    Emotionally abused: Not on file    Physically abused: Not on file    Forced sexual activity: Not on file  Other Topics Concern  . Not on file  Social History Narrative  . Not on file   Family History  Problem Relation Age of Onset  . Tuberculosis Mother   . Stroke Mother   . Heart disease Father        died at 49  . Bradycardia Brother        pacemaker  . Stroke Brother   . Cancer Paternal Uncle   . Mental illness Paternal Grandfather        suicide at 34  . Colon cancer Neg Hx       Review of Systems  All other systems reviewed and are negative.      Objective:   Physical Exam  Constitutional: He is oriented to person, place, and time. He appears well-developed and well-nourished. No distress.  HENT:  Head: Normocephalic and atraumatic.  Right Ear: External ear normal.  Left Ear: External ear normal.  Nose: Nose normal.  Mouth/Throat: Oropharynx is clear and moist. No oropharyngeal exudate.  Eyes: Pupils are equal, round, and reactive to light. Conjunctivae and EOM are normal. Right eye exhibits no discharge. Left eye exhibits no discharge. No scleral icterus.  Neck: Normal range of motion. Neck supple. No JVD present. No tracheal deviation present. No thyromegaly present.  Cardiovascular: Normal rate, regular rhythm, normal heart sounds and intact distal pulses. Exam reveals no gallop and no friction rub.  No murmur heard. Pulmonary/Chest: Effort normal  and breath sounds normal. No stridor. No respiratory distress. He has no wheezes. He has no rales. He exhibits no tenderness.  Abdominal: Soft. Bowel sounds are normal. He exhibits no distension and no mass. There is abdominal tenderness. There is no rebound and no guarding.    Musculoskeletal: Normal range of motion.  General: No tenderness, deformity or edema.  Lymphadenopathy:    He has no cervical adenopathy.  Neurological: He is alert and oriented to person, place, and time. He has normal reflexes. No cranial nerve deficit. He exhibits normal muscle tone. Coordination normal.  Skin: Skin is warm. No rash noted. He is not diaphoretic. No erythema. No pallor.  Psychiatric: He has a normal mood and affect. His behavior is normal. Judgment and thought content normal.  Vitals reviewed.         Assessment & Plan:  Fever Not certain of the diagnosis.  Patient's exam today is completely normal aside for some mild tenderness around the surgical scar.  I believe the tenderness around the surgical scar is likely due to adhesions and is unrelated to his fever.  I believe the fever and the episode of vomiting is likely due to a viral illness that he is developing.  I believe that his illness has yet to declare itself.  Today he feels fine.  Therefore I recommended that we monitor the patient over the next 24 hours to see if the situation will declare itself.  He will contact me immediately if he develops high fever, cough, rhinorrhea, further abdominal pain, nausea, vomiting, diarrhea or any other symptom that would help point to the cause of his problem.  I suspect that he has a viral syndrome complicated by pain in his abdomen due to previous surgical scar.  Therefore we will clinically monitor the patient at the present time however his exam today is completely benign and reassuring

## 2018-12-25 ENCOUNTER — Emergency Department (HOSPITAL_COMMUNITY)
Admission: EM | Admit: 2018-12-25 | Discharge: 2018-12-25 | Disposition: A | Discharge status: Home / Self Care | Payer: BC Managed Care – PPO | Attending: Emergency Medicine | Admitting: Emergency Medicine

## 2018-12-25 ENCOUNTER — Emergency Department (HOSPITAL_COMMUNITY): Payer: BC Managed Care – PPO

## 2018-12-25 ENCOUNTER — Encounter (HOSPITAL_COMMUNITY): Payer: Self-pay | Admitting: Emergency Medicine

## 2018-12-25 ENCOUNTER — Other Ambulatory Visit: Payer: Self-pay

## 2018-12-25 DIAGNOSIS — R112 Nausea with vomiting, unspecified: Secondary | ICD-10-CM

## 2018-12-25 DIAGNOSIS — I10 Essential (primary) hypertension: Secondary | ICD-10-CM | POA: Insufficient documentation

## 2018-12-25 DIAGNOSIS — K4031 Unilateral inguinal hernia, with obstruction, without gangrene, recurrent: Secondary | ICD-10-CM

## 2018-12-25 DIAGNOSIS — K4030 Unilateral inguinal hernia, with obstruction, without gangrene, not specified as recurrent: Secondary | ICD-10-CM | POA: Diagnosis not present

## 2018-12-25 DIAGNOSIS — R197 Diarrhea, unspecified: Secondary | ICD-10-CM

## 2018-12-25 DIAGNOSIS — Z7982 Long term (current) use of aspirin: Secondary | ICD-10-CM | POA: Insufficient documentation

## 2018-12-25 LAB — CBC
HCT: 53.9 % — ABNORMAL HIGH (ref 39.0–52.0)
HEMOGLOBIN: 18.3 g/dL — AB (ref 13.0–17.0)
MCH: 31.4 pg (ref 26.0–34.0)
MCHC: 34 g/dL (ref 30.0–36.0)
MCV: 92.5 fL (ref 80.0–100.0)
Platelets: 368 10*3/uL (ref 150–400)
RBC: 5.83 MIL/uL — ABNORMAL HIGH (ref 4.22–5.81)
RDW: 12.1 % (ref 11.5–15.5)
WBC: 9.4 10*3/uL (ref 4.0–10.5)
nRBC: 0 % (ref 0.0–0.2)

## 2018-12-25 LAB — URINALYSIS, ROUTINE W REFLEX MICROSCOPIC
BACTERIA UA: NONE SEEN
Bilirubin Urine: NEGATIVE
Glucose, UA: NEGATIVE mg/dL
Ketones, ur: 5 mg/dL — AB
Leukocytes,Ua: NEGATIVE
Nitrite: NEGATIVE
Protein, ur: 30 mg/dL — AB
Specific Gravity, Urine: 1.035 — ABNORMAL HIGH (ref 1.005–1.030)
pH: 5 (ref 5.0–8.0)

## 2018-12-25 LAB — COMPREHENSIVE METABOLIC PANEL
ALBUMIN: 4.6 g/dL (ref 3.5–5.0)
ALT: 30 U/L (ref 0–44)
AST: 29 U/L (ref 15–41)
Alkaline Phosphatase: 64 U/L (ref 38–126)
Anion gap: 12 (ref 5–15)
BUN: 29 mg/dL — AB (ref 8–23)
CO2: 27 mmol/L (ref 22–32)
Calcium: 10.4 mg/dL — ABNORMAL HIGH (ref 8.9–10.3)
Chloride: 99 mmol/L (ref 98–111)
Creatinine, Ser: 1.1 mg/dL (ref 0.61–1.24)
GFR calc Af Amer: >60 mL/min (ref >60)
GFR calc non Af Amer: >60 mL/min (ref >60)
Glucose, Bld: 123 mg/dL — ABNORMAL HIGH (ref 70–99)
Potassium: 4.1 mmol/L (ref 3.5–5.1)
Sodium: 138 mmol/L (ref 135–145)
Total Bilirubin: 2.2 mg/dL — ABNORMAL HIGH (ref 0.3–1.2)
Total Protein: 8.1 g/dL (ref 6.5–8.1)

## 2018-12-25 LAB — LIPASE, BLOOD: Lipase: 41 U/L (ref 11–51)

## 2018-12-25 MED ORDER — IOHEXOL 300 MG/ML  SOLN
100.0000 mL | Freq: Once | INTRAMUSCULAR | Status: AC | PRN
  Administered 2018-12-25: 100 mL via INTRAVENOUS

## 2018-12-25 MED ORDER — SODIUM CHLORIDE 0.9% FLUSH: 3.0000 mL | Freq: Once | INTRAVENOUS | Status: DC

## 2018-12-25 MED ORDER — ONDANSETRON HCL 4 MG/2ML IJ SOLN
4.0000 mg | Freq: Once | INTRAMUSCULAR | Status: AC
  Administered 2018-12-25: 4 mg via INTRAVENOUS
  Filled 2018-12-25: qty 2

## 2018-12-25 MED ORDER — SODIUM CHLORIDE 0.9 % IV BOLUS
500.0000 mL | Freq: Once | INTRAVENOUS | Status: AC
  Administered 2018-12-25: 500 mL via INTRAVENOUS

## 2018-12-25 MED ORDER — ONDANSETRON 4 MG PO TBDP
4.0000 mg | ORAL_TABLET | Freq: Once | ORAL | Status: AC | PRN
  Administered 2018-12-25: 4 mg via ORAL
  Filled 2018-12-25: qty 1

## 2018-12-25 NOTE — ED Triage Notes (Signed)
N/v/d since last Tuesday intermittently. Pt has low grade fever since onset. Pt seen by PCP Wednesday for same. Reports pain is in right lower quad radiating into lower back. Denies urinary symptoms.

## 2018-12-25 NOTE — ED Provider Notes (Signed)
[facility] EMERGENCY DEPARTMENT Provider Note   CSN: [id number] Arrival date & time: 12/25/18  3220    History   Chief Complaint Chief Complaint  Patient presents with  . Emesis    HPI Justin [name] is a 63 y.o. male with past medical history significant for hypertension, anxiety, sleep apnea CVA, hyper dysemia who presents for evaluation of nausea vomiting and diarrhea.  Patient states this began Tuesday.  Patient saw PCP on Thursday and was given prescriptions for colitis, however per patient PCP told him not to begin these medications unless his symptoms got worse.  Patient states have gotten worse, however has not improved as well.  States he feels like he has had a low-grade temperature of 99.0 as well.  Has had 3 episodes of nonbloody, nonbilious emesis which began today.  Patient has had multiple episodes of nonbloody diarrhea.  Does have history of possible ulcerative colitis, however family is not certain.  Patient states he has had right-sided abdominal pain.  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  States he does have prior history of inguinal hernia repair to that side.Denies pain to inguinal area. Surgery  Performed by Dr. Arnoldo Morale.  Has been able to tolerate p.o. intake, however has reduced appetite since symptom onset.  Denies chills, headache, vision changes, unilateral weakness, slurred speech, neck pain, neck stiffness, chest pain, shortness of breath, congestion, rhinorrhea, dysuria.  Has not taking anything for symptoms PTA.  Rates his abdominal pain a 3/10.  Pain does not radiate.  No recent abx use or recent travel.  History obtained from patient and wife.  No interpreter was used.   HPI  Past Medical History:  Diagnosis Date  . Anxiety   . Diarrhea   . Hypertension   . OSA (obstructive sleep apnea)    cpap 9 cm H2O  . Stroke [facility])     Patient  Active Problem List   Diagnosis Date Noted  . Left sided lacunar stroke ([place]) 05/24/2018  . Cerebral embolism with cerebral infarction 04/15/2018  . Right facial numbness 04/13/2018  . Recurrent unilateral inguinal hernia with obstruction and without gangrene   . Obstructive sleep apnea treated with continuous positive airway pressure (CPAP) 03/13/2018  . Hypersomnia with sleep apnea 12/15/2017  . Diarrhea 05/08/2017  . Chest pain 05/19/2015  . Anxiety 05/19/2015  . Hyperglycemia 05/19/2015  . Hypertension     Past Surgical History:  Procedure Laterality Date  . BIOPSY  06/21/2017   Procedure: BIOPSY;  Surgeon: Rogene Houston, MD;  Location: [facility] ENDO SUITE;  Service: Endoscopy;;  rectal, righta and left colon;  . COLONOSCOPY N/A 06/21/2017   Procedure: COLONOSCOPY;  Surgeon: Rogene Houston, MD;  Location: [facility] ENDO SUITE;  Service: Endoscopy;  Laterality: N/A;  1:25  . INGUINAL HERNIA REPAIR Right 04/04/2018   Procedure: HERNIA REPAIR INGUINAL ADULT WITH MESH;  Surgeon: Aviva Signs, MD;  Location: [facility] ORS;  Service: General;  Laterality: Right;  . KNEE SURGERY     tore mcl in high school (70's)        Home Medications    Prior to Admission medications   Medication Sig Start Date End Date Taking? Authorizing Provider  aspirin 325 MG tablet Take 1 tablet (325 mg total) by mouth daily. 04/16/18  Yes Hosie Poisson, MD  atorvastatin (LIPITOR) 40 MG tablet Take 1 tablet (40 mg total)  by mouth daily at 6 PM. 05/28/18  Yes Pickard, [name], MD  Cholecalciferol (VITAMIN D3) 2000 units TABS Take 2,000 Units by mouth daily.   Yes [provider]  diphenhydrAMINE (BENADRYL) 25 MG tablet Take 25 mg by mouth at bedtime as needed.    Yes [provider]  hydrochlorothiazide (HYDRODIURIL) 25 MG tablet Take 1 tablet (25 mg total) by mouth daily. 05/28/18  Yes [name], MD  ibuprofen (ADVIL,MOTRIN) 200 MG tablet Take 200 mg by mouth every 6 (six) hours as needed for  fever.   Yes [provider]  losartan (COZAAR) 100 MG tablet TAKE ONE (1) TABLET BY MOUTH EVERY DAY Patient taking differently: Take 100 mg by mouth daily.  10/09/18  Yes [name], MD  Menthol, Topical Analgesic, (BIOFREEZE ROLL-ON EX) Apply 1 application topically daily as needed (for knee pain).   Yes [provider]  Mesalamine (DELZICOL) 400 MG CPDR DR capsule Take 2 capsules (800 mg total) by mouth 2 (two) times daily. 09/18/18  Yes Rehman, Mechele Dawley, MD  metoprolol succinate (TOPROL-XL) 25 MG 24 hr tablet Take 1 tablet (25 mg total) by mouth daily. 07/26/18  Yes [name], MD  vitamin C (ASCORBIC ACID) 500 MG tablet Take 500 mg by mouth daily.   Yes [provider]  ciprofloxacin (CIPRO) 500 MG tablet Take 1 tablet (500 mg total) by mouth 2 (two) times daily. Patient not taking: Reported on 12/25/2018 12/20/18   [name], MD  HYDROcodone-acetaminophen (NORCO/VICODIN) 5-325 MG tablet Take 1 tablet by mouth every 6 (six) hours as needed for moderate pain.    [provider]  metroNIDAZOLE (FLAGYL) 500 MG tablet Take 1 tablet (500 mg total) by mouth 2 (two) times daily. Patient not taking: Reported on 12/25/2018 12/20/18   [name], MD    Family History Family History  Problem Relation Age of Onset  . Tuberculosis Mother   . Stroke Mother   . Heart disease Father        died at 69  . Bradycardia Brother        pacemaker  . Stroke Brother   . Cancer Paternal Uncle   . Mental illness Paternal Grandfather        suicide at 68  . Colon cancer Neg Hx     Social History Social History   Tobacco Use  . Smoking status: Never Smoker  . Smokeless tobacco: Never Used  Substance Use Topics  . Alcohol use: No    Alcohol/week: 0.0 standard drinks  . Drug use: No     Allergies   Patient has no known allergies.   Review of Systems Review of Systems  Constitutional:       Subjective fever.  HENT: Negative.   Eyes:  Negative.   Respiratory: Negative.   Cardiovascular: Negative.   Gastrointestinal: Positive for abdominal pain, diarrhea, nausea and vomiting. Negative for anal bleeding, blood in stool, constipation and rectal pain.  Genitourinary: Negative.   Musculoskeletal: Negative.   Skin: Negative.   Neurological: Negative.   All other systems reviewed and are negative.    Physical Exam Updated Vital Signs BP (!) 136/96 (BP Location: Right Arm) Comment: pt states he has not had HTN med due to emesis  Pulse 75   Temp 98.2 F (36.8 C) (Oral)   Resp 18   Ht 6' 1"  (1.854 m)   Wt 85.7 kg   SpO2 96%   BMI 24.94 kg/m  Physical Exam Vitals signs and nursing note reviewed. Exam conducted with a chaperone present.  Constitutional:      General: He is not in acute distress.    Appearance: He is well-developed. He is not diaphoretic.  HENT:     Head: Atraumatic.     Nose: Nose normal.     Mouth/Throat:     Comments: Mucous membranes mildly dry.  Oropharynx clear.  Uvula midline without deviation. Eyes:     Pupils: Pupils are equal, round, and reactive to light.  Neck:     Musculoskeletal: Normal range of motion and neck supple.  Cardiovascular:     Rate and Rhythm: Normal rate and regular rhythm.     Pulses: Normal pulses.     Heart sounds: Normal heart sounds.  Pulmonary:     Effort: Pulmonary effort is normal. No respiratory distress.     Comments: Clear to auscultation bilaterally without wheeze, rhonchi or rales.  No accessory muscle usage.  Able to speak in full sentences without difficulty. Abdominal:     General: There is no distension.     Palpations: Abdomen is soft.     Comments: Soft without rebound or guarding.  Mild diffuse right-sided abdominal tenderness.  Negative Murphy sign, negative McBurney point.  Negative psoas obturator Rovsing.  No CVA tenderness.  Large right-sided inguinal hernia.  Genitourinary:    Comments: Large right-sided inguinal hernia. Musculoskeletal:  Normal range of motion.     Comments: All extremities without difficulty.  Ambulatory in department.  No lower extremity edema.  Skin:    General: Skin is warm and dry.     Comments: Brisk capillary refill.  Good skin turgor.  No rashes or lesions.  No pallor to skin.  Neurological:     Mental Status: He is alert.      ED Treatments / Results  Labs (all labs ordered are listed, but only abnormal results are displayed) Labs Reviewed  COMPREHENSIVE METABOLIC PANEL - Abnormal; Notable for the following components:      Result Value   Glucose, Bld 123 (*)    BUN 29 (*)    Calcium 10.4 (*)    Total Bilirubin 2.2 (*)    All other components within normal limits  CBC - Abnormal; Notable for the following components:   RBC 5.83 (*)    Hemoglobin 18.3 (*)    HCT 53.9 (*)    All other components within normal limits  URINALYSIS, ROUTINE W REFLEX MICROSCOPIC - Abnormal; Notable for the following components:   Color, Urine AMBER (*)    APPearance HAZY (*)    Specific Gravity, Urine 1.035 (*)    Hgb urine dipstick SMALL (*)    Ketones, ur 5 (*)    Protein, ur 30 (*)    All other components within normal limits  LIPASE, BLOOD    EKG None  Radiology Ct Abdomen Pelvis W Contrast  Result Date: 12/25/2018 CLINICAL DATA:  Nausea, vomiting, diarrhea EXAM: CT ABDOMEN AND PELVIS WITH CONTRAST TECHNIQUE: Multidetector CT imaging of the abdomen and pelvis was performed using the standard protocol following bolus administration of intravenous contrast. CONTRAST:  153m OMNIPAQUE IOHEXOL 300 MG/ML  SOLN COMPARISON:  None. FINDINGS: Lower chest: No acute abnormality.  Coronary artery calcifications. Hepatobiliary: No focal liver abnormality is seen. No gallstones, gallbladder wall thickening, or biliary dilatation. Pancreas: Unremarkable. No pancreatic ductal dilatation or surrounding inflammatory changes. Spleen: Normal in size without focal abnormality. Adrenals/Urinary Tract: Adrenal glands are  unremarkable. Kidneys are  normal, without renal calculi, focal lesion, or hydronephrosis. Bladder is unremarkable. Stomach/Bowel: Stomach is within normal limits. The small bowel is diffusely distended and fluid filled, and the terminal ileum is obstructed within a right inguinal hernia that contains the cecal base (series 2, image 88). The colon is decompressed to the rectum. Occasional sigmoid diverticula. Vascular/Lymphatic: No significant vascular findings are present. No enlarged abdominal or pelvic lymph nodes. Reproductive: No mass or other abnormality. Other: No abdominal wall hernia or abnormality. No abdominopelvic ascites. Musculoskeletal: No acute or significant osseous findings. IMPRESSION: The small bowel is diffusely distended and fluid filled, and the terminal ileum is obstructed within a right inguinal hernia that contains the cecal base (series 2, image 88). Electronically Signed   By: Eddie Candle M.D.   On: 12/25/2018 14:39    Procedures Procedures (including critical care time)  Medications Ordered in ED Medications  sodium chloride flush (NS) 0.9 % injection 3 mL (has no administration in time range)  ondansetron (ZOFRAN-ODT) disintegrating tablet 4 mg (4 mg Oral Given 12/25/18 0907)  sodium chloride 0.9 % bolus 500 mL (0 mLs Intravenous Stopped 12/25/18 1510)  ondansetron (ZOFRAN) injection 4 mg (4 mg Intravenous Given 12/25/18 1324)  iohexol (OMNIPAQUE) 300 MG/ML solution 100 mL (100 mLs Intravenous Contrast Given 12/25/18 1406)     Initial Impression / Assessment and Plan / ED Course  I have reviewed the triage vital signs and the nursing notes.  Pertinent labs & imaging results that were available during my care of the patient were reviewed by me and considered in my medical decision making (see chart for details).  63 year old male presents for evaluation of N/V/D.  Symptom onset approximately 1 week ago.  Seen by PCP on Thursday given antibiotics for colitis, however was  told by PCP not start unless his symptoms got worse.  Patient began with emesis this morning.  2 episodes of nonbloody, nonbilious emesis multiple episodes of nonbloody diarrhea.  Abdomen soft, no rebound or guarding.  Does have generalized right-sided abdominal tenderness.  History of hernia repair to right side of abdomen.  No recent antibiotic use or recent travel.  Labs obtained from triage.  CBC without leukocytosis, lipase 41, metabolic panel with mild hyperglycemia at 123, BUN 29, bilirubin 2.2, labs similar to previous.  LFTs.  Will give fluids, provide antiemetics, obtain CT scan abdomen pelvis and reevaluate.  Urinalysis pending.  Patient does not want anything for pain at this time.  1400: Urinalysis negative for infection.  Patient is not any episodes of emesis or diarrhea in department.  Unable to collect C. difficile cultures.  1445: The small bowel is diffusely distended and fluid filled, and the terminal ileum is obstructed within a right inguinal hernia that contains the cecal base.  Unable to reduce right inguinal hernia.  Will consult with general surgery, Dr. Arnoldo Morale for evaluation.  1530: Consulted with Dr. Arnoldo Morale, general surgery, will evaluate patient.  1630: Right inguinal hernia reduced by Dr. Arnoldo Morale.  Feels patient is safe to be discharged as far as hernia is concerned.  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  Has not had any episodes of emesis or diarrhea in department.  Patient was able to tolerate p.o. intake after hernia reduction without emesis.  Symptoms likely resultant from obstruction from inguinal hernia.  Patient states he has felt relief after reduction.  Will DC home with plans for follow-up with PCP in the next 1 to 2 days.  Patient to also follow-up with Dr. Arnoldo Morale  for possible outpatient hernia repair.  Patient hemodynamically stable and appropriate for discharge at this time.  Low suspicion for acute emergent pathology causing patient's symptoms.   Discussed strict return precautions with patient and wife.  They both voiced understanding and are agreeable for follow-up.  Patient has been seen evaluated by my and Dr. Lacinda Axon who agrees with the treatment, plan and disposition.     Final Clinical Impressions(s) / ED Diagnoses   Final diagnoses:  Recurrent unilateral inguinal hernia with obstruction and without gangrene  Nausea vomiting and diarrhea    ED Discharge Orders    None       Marliyah Reid A, PA-C 12/25/18 1652    Nat Christen, MD 12/26/18 [phone]

## 2018-12-25 NOTE — ED Notes (Signed)
ED Provider at bedside. 

## 2018-12-25 NOTE — ED Notes (Signed)
Seen pcp last Thursday and given prescription for colitis and has not started these medications.

## 2018-12-25 NOTE — Discharge Instructions (Signed)
Your evaluated today for an inguinal hernia.  This is reduced by Dr. Arnoldo Morale.  Please follow-up with him over the next few days.  Call him to schedule an appointment.  Return to the ED for any worsening symptoms.

## 2018-12-25 NOTE — ED Notes (Signed)
Pt with diarrhea and emesis for a week, right side pain since last Thursday but worse yesterday.

## 2018-12-25 NOTE — ED Notes (Signed)
Patient transported to CT 

## 2018-12-25 NOTE — Consult Note (Signed)
Reason for Consult: Bowel obstruction, incarcerated right inguinal hernia Referring Physician: Dr. Inez Catalina ZANNIE [name] is an 63 y.o. male.  HPI: Patient is a 63 year old white male who presented to the emergency room with worsening nausea, vomiting, and right groin pain.  He had seen his primary care doctor last week for some nausea and diarrhea.  At that time, he had no significant swelling in the right groin region, though he did have some stinging present.  He is status post a right inguinal herniorrhaphy with mesh in May 2019.  He states he has not noticed any right groin pain until last week.  He states that today he started having worsening right groin pain and nausea.  He did notice a lump in the right groin region.  His CT scan of the abdomen was performed which revealed incarcerated right inguinal hernia.  This also resulted in a partial bowel obstruction.  I was consulted to assess the hernia.  Prior to reduction, the patient had a 3 out of 10 pain in the right groin region.  Past Medical History:  Diagnosis Date  . Anxiety   . Diarrhea   . Hypertension   . OSA (obstructive sleep apnea)    cpap 9 cm H2O  . Stroke [facility])     Past Surgical History:  Procedure Laterality Date  . BIOPSY  06/21/2017   Procedure: BIOPSY;  Surgeon: Rogene Houston, MD;  [facility];  Service: Endoscopy;;  rectal, righta and left colon;  . COLONOSCOPY N/A 06/21/2017   Procedure: COLONOSCOPY;  Surgeon: Rogene Houston, MD;  Location: [facility];  Service: Endoscopy;  Laterality: N/A;  1:25  . INGUINAL HERNIA REPAIR Right 04/04/2018   Procedure: HERNIA REPAIR INGUINAL ADULT WITH MESH;  Surgeon: Aviva Signs, MD;  Location: [facility] ORS;  Service: General;  Laterality: Right;  . KNEE SURGERY     tore mcl in high school (70's)    Family History  Problem Relation Age of Onset  . Tuberculosis Mother   . Stroke Mother   . Heart disease Father        died at 73  . Bradycardia Brother         pacemaker  . Stroke Brother   . Cancer Paternal Uncle   . Mental illness Paternal Grandfather        suicide at 75  . Colon cancer Neg Hx     Social History:  reports that he has never smoked. He has never used smokeless tobacco. He reports that he does not drink alcohol or use drugs.  Allergies: No Known Allergies  Medications: I have reviewed the patient's current medications.  Results for orders placed or performed during the hospital encounter of 12/25/18 (from the past 48 hour(s))  Lipase, blood     Status: None   Collection Time: 12/25/18  9:39 AM  Result Value Ref Range   Lipase 41 11 - 51 U/L    Comment: Performed at [facility], [address], [place], [place] [address]  Comprehensive metabolic panel     Status: Abnormal   Collection Time: 12/25/18  9:39 AM  Result Value Ref Range   Sodium 138 135 - 145 mmol/L   Potassium 4.1 3.5 - 5.1 mmol/L   Chloride 99 98 - 111 mmol/L   CO2 27 22 - 32 mmol/L   Glucose, Bld 123 (H) 70 - 99 mg/dL   BUN 29 (H) 8 - 23 mg/dL   Creatinine, Ser 1.10  0.61 - 1.24 mg/dL   Calcium 10.4 (H) 8.9 - 10.3 mg/dL   Total Protein 8.1 6.5 - 8.1 g/dL   Albumin 4.6 3.5 - 5.0 g/dL   AST 29 15 - 41 U/L   ALT 30 0 - 44 U/L   Alkaline Phosphatase 64 38 - 126 U/L   Total Bilirubin 2.2 (H) 0.3 - 1.2 mg/dL   GFR calc non Af Amer >60 >60 mL/min   GFR calc Af Amer >60 >60 mL/min   Anion gap 12 5 - 15    Comment: Performed at [facility], [address] [place], [place] [address]  CBC     Status: Abnormal   Collection Time: 12/25/18  9:39 AM  Result Value Ref Range   WBC 9.4 4.0 - 10.5 K/uL   RBC 5.83 (H) 4.22 - 5.81 MIL/uL   Hemoglobin 18.3 (H) 13.0 - 17.0 g/dL   HCT 53.9 (H) 39.0 - 52.0 %   MCV 92.5 80.0 - 100.0 fL   MCH 31.4 26.0 - 34.0 pg   MCHC 34.0 30.0 - 36.0 g/dL   RDW 12.1 11.5 - 15.5 %   Platelets 368 150 - 400 K/uL   nRBC 0.0 0.0 - 0.2 %    Comment: Performed at [facility], [address], [place], [place] [address]  Urinalysis,  Routine w reflex microscopic     Status: Abnormal   Collection Time: 12/25/18  2:00 PM  Result Value Ref Range   Color, Urine AMBER (A) YELLOW    Comment: BIOCHEMICALS MAY BE AFFECTED BY COLOR   APPearance HAZY (A) CLEAR   Specific Gravity, Urine 1.035 (H) 1.005 - 1.030   pH 5.0 5.0 - 8.0   Glucose, UA NEGATIVE NEGATIVE mg/dL   Hgb urine dipstick SMALL (A) NEGATIVE   Bilirubin Urine NEGATIVE NEGATIVE   Ketones, ur 5 (A) NEGATIVE mg/dL   Protein, ur 30 (A) NEGATIVE mg/dL   Nitrite NEGATIVE NEGATIVE   Leukocytes,Ua NEGATIVE NEGATIVE   RBC / HPF 6-10 0 - 5 RBC/hpf   WBC, UA 0-5 0 - 5 WBC/hpf   Bacteria, UA NONE SEEN NONE SEEN   Mucus PRESENT    Hyaline Casts, UA PRESENT     Comment: Performed at [facility], [address] [place], [place] [address]    Ct Abdomen Pelvis W Contrast  Result Date: 12/25/2018 CLINICAL DATA:  Nausea, vomiting, diarrhea EXAM: CT ABDOMEN AND PELVIS WITH CONTRAST TECHNIQUE: Multidetector CT imaging of the abdomen and pelvis was performed using the standard protocol following bolus administration of intravenous contrast. CONTRAST:  120m OMNIPAQUE IOHEXOL 300 MG/ML  SOLN COMPARISON:  None. FINDINGS: Lower chest: No acute abnormality.  Coronary artery calcifications. Hepatobiliary: No focal liver abnormality is seen. No gallstones, gallbladder wall thickening, or biliary dilatation. Pancreas: Unremarkable. No pancreatic ductal dilatation or surrounding inflammatory changes. Spleen: Normal in size without focal abnormality. Adrenals/Urinary Tract: Adrenal glands are unremarkable. Kidneys are normal, without renal calculi, focal lesion, or hydronephrosis. Bladder is unremarkable. Stomach/Bowel: Stomach is within normal limits. The small bowel is diffusely distended and fluid filled, and the terminal ileum is obstructed within a right inguinal hernia that contains the cecal base (series 2, image 88). The colon is decompressed to the rectum. Occasional sigmoid diverticula.  Vascular/Lymphatic: No significant vascular findings are present. No enlarged abdominal or pelvic lymph nodes. Reproductive: No mass or other abnormality. Other: No abdominal wall hernia or abnormality. No abdominopelvic ascites. Musculoskeletal: No acute or significant osseous findings. IMPRESSION: The small bowel is  diffusely distended and fluid filled, and the terminal ileum is obstructed within a right inguinal hernia that contains the cecal base (series 2, image 88). Electronically Signed   By: Eddie Candle M.D.   On: 12/25/2018 14:39    ROS:  Pertinent items are noted in HPI.  Blood pressure (!) 136/96, pulse 75, temperature 98.2 F (36.8 C), temperature source Oral, resp. rate 18, height 6' 1"  (1.854 m), weight 85.7 kg, SpO2 96 %. Physical Exam: Well-developed well-nourished white male no acute distress Head is normocephalic, atraumatic Lungs clear to auscultation with good breath sounds bilaterally Heart examination reveals regular rate and rhythm without S3, S4, murmurs Abdomen soft, nontender, nondistended.  A right inguinal incarcerated hernia is present, though it was reduced at bedside.  CT scan images personally reviewed  Assessment/Plan: Impression: Recurrent right inguinal hernia, initially incarcerated but has been reduced Plan: Will arrange for outpatient repair of his recurrent right inguinal hernia.  Patient will call office to schedule appointment.  Aviva Signs 12/25/2018, 4:24 PM

## 2018-12-27 ENCOUNTER — Encounter: Payer: Self-pay | Admitting: General Surgery

## 2018-12-27 ENCOUNTER — Ambulatory Visit: Payer: BC Managed Care – PPO | Admitting: General Surgery

## 2018-12-27 VITALS — BP 118/76 | HR 86 | Temp 97.7°F | Resp 18 | Wt 190.8 lb

## 2018-12-27 DIAGNOSIS — K4091 Unilateral inguinal hernia, without obstruction or gangrene, recurrent: Secondary | ICD-10-CM | POA: Diagnosis not present

## 2018-12-27 NOTE — Patient Instructions (Signed)

## 2018-12-27 NOTE — Progress Notes (Signed)
Subjective:     Justin [name]  Here for hospital follow-up after reduction of right inguinal hernia.  Patient states that yesterday he did not feel well and had frequent episodes of rushing to the bathroom to have bowel movements.  He denies any significant right groin pain.  Today, he does feel better.  He has not noticed a significant lump in the right groin region.  No nausea or vomiting is noted. Objective:    BP 118/76 (BP Location: Left Arm, Patient Position: Sitting, Cuff Size: Normal)   Pulse 86   Temp 97.7 F (36.5 C) (Temporal)   Resp 18   Wt 190 lb 12.8 oz (86.5 kg)   BMI 25.17 kg/m   General:  alert, cooperative and no distress  Abdomen is soft, nontender, nondistended.  Easily reducible right inguinal hernia is present.  Much improved since I last saw him 2 days ago.     Assessment:    Recurrent right inguinal hernia    Plan:   Patient would like to delay repair of the inguinal hernia as long as possible.  That is fine with me.  The signs and symptoms of incarceration were explained to the patient.  He will call me should his condition worsen.  Will follow-up expectantly.

## 2019-01-30 ENCOUNTER — Observation Stay (HOSPITAL_COMMUNITY)
Admission: EM | Admit: 2019-01-30 | Discharge: 2019-01-31 | Disposition: A | Discharge status: Home / Self Care | Payer: BC Managed Care – PPO | Attending: General Surgery | Admitting: General Surgery

## 2019-01-30 ENCOUNTER — Emergency Department (HOSPITAL_COMMUNITY): Payer: BC Managed Care – PPO

## 2019-01-30 ENCOUNTER — Other Ambulatory Visit: Payer: Self-pay

## 2019-01-30 ENCOUNTER — Encounter (HOSPITAL_COMMUNITY): Payer: Self-pay | Admitting: Emergency Medicine

## 2019-01-30 DIAGNOSIS — K4031 Unilateral inguinal hernia, with obstruction, without gangrene, recurrent: Secondary | ICD-10-CM | POA: Diagnosis not present

## 2019-01-30 DIAGNOSIS — Z7982 Long term (current) use of aspirin: Secondary | ICD-10-CM | POA: Insufficient documentation

## 2019-01-30 DIAGNOSIS — R109 Unspecified abdominal pain: Secondary | ICD-10-CM | POA: Diagnosis present

## 2019-01-30 DIAGNOSIS — I6930 Unspecified sequelae of cerebral infarction: Secondary | ICD-10-CM | POA: Diagnosis not present

## 2019-01-30 DIAGNOSIS — K56609 Unspecified intestinal obstruction, unspecified as to partial versus complete obstruction: Secondary | ICD-10-CM | POA: Diagnosis not present

## 2019-01-30 DIAGNOSIS — G4733 Obstructive sleep apnea (adult) (pediatric): Secondary | ICD-10-CM | POA: Insufficient documentation

## 2019-01-30 DIAGNOSIS — K4030 Unilateral inguinal hernia, with obstruction, without gangrene, not specified as recurrent: Secondary | ICD-10-CM

## 2019-01-30 DIAGNOSIS — E860 Dehydration: Secondary | ICD-10-CM | POA: Insufficient documentation

## 2019-01-30 DIAGNOSIS — K4090 Unilateral inguinal hernia, without obstruction or gangrene, not specified as recurrent: Secondary | ICD-10-CM

## 2019-01-30 DIAGNOSIS — I10 Essential (primary) hypertension: Secondary | ICD-10-CM | POA: Diagnosis not present

## 2019-01-30 DIAGNOSIS — Z79899 Other long term (current) drug therapy: Secondary | ICD-10-CM | POA: Diagnosis not present

## 2019-01-30 LAB — URINALYSIS, ROUTINE W REFLEX MICROSCOPIC
Bilirubin Urine: NEGATIVE
Glucose, UA: NEGATIVE mg/dL
Hgb urine dipstick: NEGATIVE
Ketones, ur: NEGATIVE mg/dL
Leukocytes,Ua: NEGATIVE
Nitrite: NEGATIVE
Protein, ur: NEGATIVE mg/dL
Specific Gravity, Urine: 1.029 (ref 1.005–1.030)
pH: 5 (ref 5.0–8.0)

## 2019-01-30 LAB — CBC WITH DIFFERENTIAL/PLATELET
Abs Immature Granulocytes: 0.06 10*3/uL (ref 0.00–0.07)
Basophils Absolute: 0.1 10*3/uL (ref 0.0–0.1)
Basophils Relative: 0 %
EOS PCT: 0 %
Eosinophils Absolute: 0 10*3/uL (ref 0.0–0.5)
HCT: 55.9 % — ABNORMAL HIGH (ref 39.0–52.0)
Hemoglobin: 19.1 g/dL — ABNORMAL HIGH (ref 13.0–17.0)
Immature Granulocytes: 0 %
LYMPHS ABS: 0.8 10*3/uL (ref 0.7–4.0)
LYMPHS PCT: 5 %
MCH: 31.6 pg (ref 26.0–34.0)
MCHC: 34.2 g/dL (ref 30.0–36.0)
MCV: 92.5 fL (ref 80.0–100.0)
Monocytes Absolute: 1.1 10*3/uL — ABNORMAL HIGH (ref 0.1–1.0)
Monocytes Relative: 7 %
Neutro Abs: 13.9 10*3/uL — ABNORMAL HIGH (ref 1.7–7.7)
Neutrophils Relative %: 88 %
Platelets: 353 10*3/uL (ref 150–400)
RBC: 6.04 MIL/uL — AB (ref 4.22–5.81)
RDW: 12.6 % (ref 11.5–15.5)
WBC: 15.8 10*3/uL — ABNORMAL HIGH (ref 4.0–10.5)
nRBC: 0 % (ref 0.0–0.2)

## 2019-01-30 LAB — LACTIC ACID, PLASMA
Lactic Acid, Venous: 2.6 mmol/L (ref 0.5–1.9)
Lactic Acid, Venous: 2.4 mmol/L (ref 0.5–1.9)

## 2019-01-30 LAB — COMPREHENSIVE METABOLIC PANEL
ALT: 30 U/L (ref 0–44)
AST: 27 U/L (ref 15–41)
Albumin: 4.6 g/dL (ref 3.5–5.0)
Alkaline Phosphatase: 72 U/L (ref 38–126)
Anion gap: 11 (ref 5–15)
BUN: 27 mg/dL — AB (ref 8–23)
CO2: 28 mmol/L (ref 22–32)
Calcium: 10.2 mg/dL (ref 8.9–10.3)
Chloride: 98 mmol/L (ref 98–111)
Creatinine, Ser: 1.15 mg/dL (ref 0.61–1.24)
GFR calc Af Amer: >60 mL/min (ref >60)
GFR calc non Af Amer: >60 mL/min (ref >60)
Glucose, Bld: 176 mg/dL — ABNORMAL HIGH (ref 70–99)
Potassium: 4 mmol/L (ref 3.5–5.1)
Sodium: 137 mmol/L (ref 135–145)
Total Bilirubin: 2 mg/dL — ABNORMAL HIGH (ref 0.3–1.2)
Total Protein: 7.7 g/dL (ref 6.5–8.1)

## 2019-01-30 LAB — LIPASE, BLOOD: Lipase: 31 U/L (ref 11–51)

## 2019-01-30 LAB — SURGICAL PCR SCREEN
MRSA, PCR: NEGATIVE
Staphylococcus aureus: NEGATIVE

## 2019-01-30 MED ORDER — ENOXAPARIN SODIUM 40 MG/0.4ML ~~LOC~~ SOLN
40.0000 mg | SUBCUTANEOUS | Status: DC
  Administered 2019-01-30: 40 mg via SUBCUTANEOUS
  Filled 2019-01-30: qty 0.4

## 2019-01-30 MED ORDER — ATORVASTATIN CALCIUM 40 MG PO TABS: 40.0000 mg | ORAL_TABLET | Freq: Every day | ORAL | Status: DC

## 2019-01-30 MED ORDER — PIPERACILLIN-TAZOBACTAM 3.375 G IVPB
3.3750 g | Freq: Three times a day (TID) | INTRAVENOUS | Status: DC
  Administered 2019-01-31: 3.375 g via INTRAVENOUS
  Filled 2019-01-30: qty 50

## 2019-01-30 MED ORDER — SODIUM CHLORIDE 0.9 % IV BOLUS
500.0000 mL | Freq: Once | INTRAVENOUS | Status: AC
  Administered 2019-01-30: 500 mL via INTRAVENOUS

## 2019-01-30 MED ORDER — ONDANSETRON HCL 4 MG/2ML IJ SOLN
4.0000 mg | INTRAMUSCULAR | Status: DC | PRN
  Administered 2019-01-30: 4 mg via INTRAVENOUS
  Filled 2019-01-30: qty 2

## 2019-01-30 MED ORDER — PIPERACILLIN-TAZOBACTAM 3.375 G IVPB 30 MIN
3.3750 g | Freq: Once | INTRAVENOUS | Status: AC
  Administered 2019-01-30: 3.375 g via INTRAVENOUS
  Filled 2019-01-30 (×2): qty 50

## 2019-01-30 MED ORDER — MORPHINE SULFATE (PF) 4 MG/ML IV SOLN
4.0000 mg | INTRAVENOUS | Status: DC | PRN
  Administered 2019-01-30: 4 mg via INTRAVENOUS
  Filled 2019-01-30: qty 1

## 2019-01-30 MED ORDER — ONDANSETRON 4 MG PO TBDP: 4.0000 mg | ORAL_TABLET | Freq: Four times a day (QID) | ORAL | Status: DC | PRN

## 2019-01-30 MED ORDER — LOSARTAN POTASSIUM 50 MG PO TABS
100.0000 mg | ORAL_TABLET | Freq: Every day | ORAL | Status: DC
  Administered 2019-01-30: 100 mg via ORAL
  Filled 2019-01-30: qty 2

## 2019-01-30 MED ORDER — ACETAMINOPHEN 325 MG PO TABS: 650.0000 mg | ORAL_TABLET | Freq: Four times a day (QID) | ORAL | Status: DC | PRN

## 2019-01-30 MED ORDER — SODIUM CHLORIDE 0.9 % IV SOLN
INTRAVENOUS | Status: DC
  Administered 2019-01-30: 13:00:00 via INTRAVENOUS

## 2019-01-30 MED ORDER — ACETAMINOPHEN 650 MG RE SUPP: 650.0000 mg | Freq: Four times a day (QID) | RECTAL | Status: DC | PRN

## 2019-01-30 MED ORDER — SODIUM CHLORIDE 0.9 % IV SOLN
INTRAVENOUS | Status: DC
  Administered 2019-01-30 – 2019-01-31 (×2): via INTRAVENOUS

## 2019-01-30 MED ORDER — HYDROMORPHONE HCL 1 MG/ML IJ SOLN: 1.0000 mg | INTRAMUSCULAR | Status: DC | PRN

## 2019-01-30 MED ORDER — SODIUM CHLORIDE 0.9 % IV BOLUS
1000.0000 mL | Freq: Once | INTRAVENOUS | Status: AC
  Administered 2019-01-30: 1000 mL via INTRAVENOUS

## 2019-01-30 MED ORDER — PIPERACILLIN-TAZOBACTAM 3.375 G IVPB: 3.3750 g | Freq: Three times a day (TID) | INTRAVENOUS | Status: DC

## 2019-01-30 MED ORDER — SIMETHICONE 80 MG PO CHEW: 40.0000 mg | CHEWABLE_TABLET | Freq: Four times a day (QID) | ORAL | Status: DC | PRN

## 2019-01-30 MED ORDER — ONDANSETRON HCL 4 MG/2ML IJ SOLN: 4.0000 mg | Freq: Four times a day (QID) | INTRAMUSCULAR | Status: DC | PRN

## 2019-01-30 MED ORDER — MUPIROCIN 2 % EX OINT: 1.0000 "application " | TOPICAL_OINTMENT | Freq: Two times a day (BID) | CUTANEOUS | Status: DC

## 2019-01-30 MED ORDER — HYDROCHLOROTHIAZIDE 25 MG PO TABS
25.0000 mg | ORAL_TABLET | Freq: Every day | ORAL | Status: DC
  Administered 2019-01-30: 25 mg via ORAL
  Filled 2019-01-30: qty 1

## 2019-01-30 MED ORDER — OXYCODONE-ACETAMINOPHEN 5-325 MG PO TABS: 1.0000 | ORAL_TABLET | ORAL | Status: DC | PRN

## 2019-01-30 MED ORDER — METOPROLOL SUCCINATE ER 25 MG PO TB24
25.0000 mg | ORAL_TABLET | Freq: Every day | ORAL | Status: DC
  Administered 2019-01-30: 25 mg via ORAL
  Filled 2019-01-30: qty 1

## 2019-01-30 NOTE — Progress Notes (Signed)
Pharmacy Antibiotic Note  Justin [name] is a 63 y.o. male admitted on 01/30/2019 with intra-abdominal infection.  Pharmacy has been consulted for Zosyn dosing.  Plan: Zosyn 3.375gm IV x 1 now over 30 min, then Zosyn 3.375g IV q8h (4 hour infusion).  F/U cxs and clinical progress Monitor V/S and labs  Height: 6' 1"  (185.4 cm) Weight: 190 lb (86.2 kg) IBW/kg (Calculated) : 79.9  Temp (24hrs), Avg:97.7 F (36.5 C), Min:97.7 F (36.5 C), Max:97.7 F (36.5 C)  Recent Labs  Lab 01/30/19 1042 01/30/19 1043 01/30/19 1252  WBC 15.8*  --   --   CREATININE 1.15  --   --   LATICACIDVEN  --  2.6* 2.4*    Estimated Creatinine Clearance: 74.3 mL/min (by C-G formula based on SCr of 1.15 mg/dL).    No Known Allergies  Antimicrobials this admission: Zosyn 3/25 >>   Dose adjustments this admission: N/a  Microbiology results: No cxs  Thank you for allowing pharmacy to be a part of this patient's care.  Isac Sarna, BS [place], [place] Clinical Pharmacist Pager [phone] 01/30/2019 8:07 PM

## 2019-01-30 NOTE — ED Notes (Signed)
edh

## 2019-01-30 NOTE — ED Notes (Signed)
Patient sleeping - advised patient again we needed urine specimen.

## 2019-01-30 NOTE — H&P (Signed)
Justin [name] is an 63 y.o. male.   Chief Complaint: Nausea and vomiting, right groin swelling HPI: Patient is a 63 year old white male with a known history of a recurrent right inguinal hernia who presents with a 24-hour history of worsening right groin swelling, nausea, and vomiting.  He was seen in the emergency room and the hernia was somewhat reducible.  He was noted to have an elevated lactic acid level and a CT scan of the abdomen revealed the partial bowel obstruction due to the right inguinal hernia.  There is some intestine present in it, though on physical exam it is reducible but keeps popping out.  As this is a repeat of what happened to him approximately 1 month ago, he will need to undergo surgical intervention.  He currently has no abdominal pain.  He does say he has some abdominal cramping.  His nausea and vomiting have resolved.  Past Medical History:  Diagnosis Date  . Anxiety   . Diarrhea   . Hypertension   . OSA (obstructive sleep apnea)    cpap 9 cm H2O  . Stroke [facility])     Past Surgical History:  Procedure Laterality Date  . BIOPSY  06/21/2017   Procedure: BIOPSY;  Surgeon: Justin Houston, MD;  Location: [facility];  Service: Endoscopy;;  rectal, righta and left colon;  . COLONOSCOPY N/A 06/21/2017   Procedure: COLONOSCOPY;  Surgeon: Justin Houston, MD;  Location: [facility];  Service: Endoscopy;  Laterality: N/A;  1:25  . INGUINAL HERNIA REPAIR Right 04/04/2018   Procedure: HERNIA REPAIR INGUINAL ADULT WITH MESH;  Surgeon: Justin Signs, MD;  Location: [facility] ORS;  Service: General;  Laterality: Right;  . KNEE SURGERY     tore mcl in high school (70's)    Family History  Problem Relation Age of Onset  . Tuberculosis Mother   . Stroke Mother   . Heart disease Father        died at 63  . Bradycardia Brother        pacemaker  . Stroke Brother   . Cancer Paternal Uncle   . Mental illness Paternal Grandfather        suicide at 24  . Colon cancer Neg Hx     Social History:  reports that he has never smoked. He has never used smokeless tobacco. He reports that he does not drink alcohol or use drugs.  Allergies: No Known Allergies  Medications Prior to Admission  Medication Sig Dispense Refill  . aspirin 325 MG tablet Take 1 tablet (325 mg total) by mouth daily. 30 tablet 0  . atorvastatin (LIPITOR) 40 MG tablet Take 1 tablet (40 mg total) by mouth daily at 6 PM. 90 tablet 3  . Cholecalciferol (VITAMIN D3) 2000 units TABS Take 2,000 Units by mouth daily.    . diphenhydrAMINE (BENADRYL) 25 MG tablet Take 25 mg by mouth at bedtime as needed.     . hydrochlorothiazide (HYDRODIURIL) 25 MG tablet Take 1 tablet (25 mg total) by mouth daily. 90 tablet 3  . ibuprofen (ADVIL,MOTRIN) 200 MG tablet Take 200 mg by mouth every 6 (six) hours as needed for fever.    Marland Kitchen losartan (COZAAR) 100 MG tablet TAKE ONE (1) TABLET BY MOUTH EVERY DAY (Patient taking differently: Take 100 mg by mouth daily. ) 90 tablet 1  . Mesalamine (DELZICOL) 400 MG CPDR DR capsule Take 2 capsules (800 mg total) by mouth 2 (two) times daily. 120 capsule 11  .  metoprolol succinate (TOPROL-XL) 25 MG 24 hr tablet Take 1 tablet (25 mg total) by mouth daily. 90 tablet 3  . ciprofloxacin (CIPRO) 500 MG tablet Take 1 tablet (500 mg total) by mouth 2 (two) times daily. (Patient not taking: Reported on 12/25/2018) 20 tablet 0  . metroNIDAZOLE (FLAGYL) 500 MG tablet Take 1 tablet (500 mg total) by mouth 2 (two) times daily. (Patient not taking: Reported on 12/25/2018) 20 tablet 0    Results for orders placed or performed during the hospital encounter of 01/30/19 (from the past 48 hour(s))  Comprehensive metabolic panel     Status: Abnormal   Collection Time: 01/30/19 10:42 AM  Result Value Ref Range   Sodium 137 135 - 145 mmol/L   Potassium 4.0 3.5 - 5.1 mmol/L   Chloride 98 98 - 111 mmol/L   CO2 28 22 - 32 mmol/L   Glucose, Bld 176 (H) 70 - 99 mg/dL   BUN 27 (H) 8 - 23 mg/dL   Creatinine,  Ser 1.15 0.61 - 1.24 mg/dL   Calcium 10.2 8.9 - 10.3 mg/dL   Total Protein 7.7 6.5 - 8.1 g/dL   Albumin 4.6 3.5 - 5.0 g/dL   AST 27 15 - 41 U/L   ALT 30 0 - 44 U/L   Alkaline Phosphatase 72 38 - 126 U/L   Total Bilirubin 2.0 (H) 0.3 - 1.2 mg/dL   GFR calc non Af Amer >60 >60 mL/min   GFR calc Af Amer >60 >60 mL/min   Anion gap 11 5 - 15    Comment: Performed at [facility], [address] [place], [place] [address]  Lipase, blood     Status: None   Collection Time: 01/30/19 10:42 AM  Result Value Ref Range   Lipase 31 11 - 51 U/L    Comment: Performed at [facility], [address], [place], [place] [address]  CBC with Differential     Status: Abnormal   Collection Time: 01/30/19 10:42 AM  Result Value Ref Range   WBC 15.8 (H) 4.0 - 10.5 K/uL   RBC 6.04 (H) 4.22 - 5.81 MIL/uL   Hemoglobin 19.1 (H) 13.0 - 17.0 g/dL   HCT 55.9 (H) 39.0 - 52.0 %   MCV 92.5 80.0 - 100.0 fL   MCH 31.6 26.0 - 34.0 pg   MCHC 34.2 30.0 - 36.0 g/dL   RDW 12.6 11.5 - 15.5 %   Platelets 353 150 - 400 K/uL   nRBC 0.0 0.0 - 0.2 %   Neutrophils Relative % 88 %   Neutro Abs 13.9 (H) 1.7 - 7.7 K/uL   Lymphocytes Relative 5 %   Lymphs Abs 0.8 0.7 - 4.0 K/uL   Monocytes Relative 7 %   Monocytes Absolute 1.1 (H) 0.1 - 1.0 K/uL   Eosinophils Relative 0 %   Eosinophils Absolute 0.0 0.0 - 0.5 K/uL   Basophils Relative 0 %   Basophils Absolute 0.1 0.0 - 0.1 K/uL   Immature Granulocytes 0 %   Abs Immature Granulocytes 0.06 0.00 - 0.07 K/uL    Comment: Performed at [facility], [address], [place], [place] [address]  Lactic acid, plasma     Status: Abnormal   Collection Time: 01/30/19 10:43 AM  Result Value Ref Range   Lactic Acid, Venous 2.6 (HH) 0.5 - 1.9 mmol/L    Comment: CRITICAL RESULT CALLED TO, READ BACK BY AND VERIFIED WITH: CRAWFORD,H@1119  BY MATTHEWS, B 3.25.2020 Performed at [facility], [address],  [place], [place] [address]   Lactic acid, plasma     Status: Abnormal   Collection  Time: 01/30/19 12:52 PM  Result Value Ref Range   Lactic Acid, Venous 2.4 (HH) 0.5 - 1.9 mmol/L    Comment: CRITICAL RESULT CALLED TO, READ BACK BY AND VERIFIED WITH: LASHLEY,S AT 1411 ON 3.25.20 BY ISLEY,B Performed at [facility] [place], [place] [address]   Urinalysis, Routine w reflex microscopic     Status: Abnormal   Collection Time: 01/30/19  3:51 PM  Result Value Ref Range   Color, Urine AMBER (A) YELLOW    Comment: BIOCHEMICALS MAY BE AFFECTED BY COLOR   APPearance CLEAR CLEAR   Specific Gravity, Urine 1.029 1.005 - 1.030   pH 5.0 5.0 - 8.0   Glucose, UA NEGATIVE NEGATIVE mg/dL   Hgb urine dipstick NEGATIVE NEGATIVE   Bilirubin Urine NEGATIVE NEGATIVE   Ketones, ur NEGATIVE NEGATIVE mg/dL   Protein, ur NEGATIVE NEGATIVE mg/dL   Nitrite NEGATIVE NEGATIVE   Leukocytes,Ua NEGATIVE NEGATIVE    Comment: Performed at [facility], [address] [place], [place] [address]   Ct Abdomen Pelvis Wo Contrast  Result Date: 01/30/2019 CLINICAL DATA:  Generalized abdominal pain for 1 month. EXAM: CT ABDOMEN AND PELVIS WITHOUT CONTRAST TECHNIQUE: Multidetector CT imaging of the abdomen and pelvis was performed following the standard protocol without IV contrast. COMPARISON:  CT scan of December 25, 2018. FINDINGS: Lower chest: No acute abnormality. Hepatobiliary: No focal liver abnormality is seen. No gallstones, gallbladder wall thickening, or biliary dilatation. Pancreas: Unremarkable. No pancreatic ductal dilatation or surrounding inflammatory changes. Spleen: Normal in size without focal abnormality. Adrenals/Urinary Tract: Adrenal glands appear normal. Bilateral parapelvic renal cysts are noted. Small nonobstructive calculus seen in upper pole of right kidney. No hydronephrosis or renal obstruction is noted. No ureteral calculi are noted. Urinary bladder is unremarkable. Stomach/Bowel: Sigmoid diverticulosis is noted without inflammation. The stomach appears normal. Small  bowel dilatation is again noted due to right inguinal hernia which contains terminal ileum and inferior portion of cecum, resulting in small bowel obstruction. Mild inflammatory changes are seen within the right inguinal hernia. Vascular/Lymphatic: Aortic atherosclerosis. No enlarged abdominal or pelvic lymph nodes. Reproductive: Prostate is unremarkable. Other: No abnormal fluid collection is noted. Musculoskeletal: No acute or significant osseous findings. IMPRESSION: Large right inguinal hernia is noted, which contains a portion of the inferior cecum as well as the terminal ileum, and results in small bowel obstruction. This was present on prior exam. Mild inflammatory changes are seen involving the hernia. Small nonobstructive right renal calculus is noted. No hydronephrosis or renal obstruction is noted. Sigmoid diverticulosis is noted without inflammation. Aortic Atherosclerosis (ICD10-I70.0). Electronically Signed   By: Marijo Conception, M.D.   On: 01/30/2019 15:43   Dg Abd Acute W/chest  Result Date: 01/30/2019 CLINICAL DATA:  Abdominal pain and diarrhea. Recent bowel obstruction EXAM: DG ABDOMEN ACUTE W/ 1V CHEST COMPARISON:  Chest radiograph May 19, 2015; CT abdomen and pelvis December 25, 2018 FINDINGS: PA chest: There is slight left base atelectasis. The lungs elsewhere are clear. Heart size and pulmonary vascularity are normal. No adenopathy. Supine and upright abdomen: There are loops of dilated bowel with multiple air-fluid levels. No free air evident. There are phleboliths in the pelvis. IMPRESSION: Bowel dilatation with multiple air-fluid levels. Suspect a degree of small bowel obstruction. No free air evident. No lung edema or consolidation. There is slight left base atelectasis. Electronically Signed   By: Gwyndolyn Saxon  Jasmine December III M.D.   On: 01/30/2019 11:27    Review of Systems  Constitutional: Positive for malaise/fatigue.  HENT: Negative.   Eyes: Negative.   Respiratory: Negative.    Cardiovascular: Negative.   Gastrointestinal: Positive for abdominal pain, heartburn, nausea and vomiting.  Genitourinary: Negative.   Musculoskeletal: Negative.   Skin: Negative.   Neurological: Negative.   Endo/Heme/Allergies: Negative.   Psychiatric/Behavioral: Negative.     Blood pressure (!) 131/93, pulse 71, temperature 97.7 F (36.5 C), temperature source Oral, resp. rate 16, height 6' 1"  (1.854 m), weight 87.7 kg, SpO2 96 %. Physical Exam  Vitals reviewed. Constitutional: He is oriented to person, place, and time. He appears well-developed and well-nourished. No distress.  HENT:  Head: Normocephalic and atraumatic.  Cardiovascular: Normal rate, regular rhythm and normal heart sounds. Exam reveals no gallop and no friction rub.  No murmur heard. Respiratory: Effort normal and breath sounds normal.  GI: Soft. He exhibits distension. There is abdominal tenderness. There is no rebound and no guarding.  His right inguinal hernia is reducible with some pressure, but keeps recurring.  No rigidity is noted.  Mild abdominal distention is noted, but it is not tense.  Bowel sounds are present.  He is status post a large right inguinal herniorrhaphy with mesh in May 2019.  Neurological: He is alert and oriented to person, place, and time.  Skin: Skin is warm and dry.     Assessment/Plan Impression: Recurrent incarcerated right inguinal hernia with bowel obstructive symptoms.  His lactic acid was elevated but is decreasing. Plan: Patient be admitted to hospital for IV hydration.  I will start Zosyn.  He will subsequently undergo a recurrent right inguinal herniorrhaphy with mesh for incarceration in the morning.  As this is recurrent in nature and there is evidence of persistent intermittent bowel obstruction, this cannot be postponed as he will deteriorate over the next 4 weeks if surgical intervention is not performed now.  The risks and benefits of the procedure including bleeding,  infection, mesh use, and the possibility of recurrence of the hernia were fully explained to the patient, who gave informed consent.  Justin Signs, MD 01/30/2019, 8:19 PM

## 2019-01-30 NOTE — ED Notes (Signed)
ED TO INPATIENT HANDOFF REPORT  ED Nurse Name and Phone #: [phone]  S Name/Age/Gender Justin [name] 63 y.o. male Room/Bed: APA11/APA11  Code Status   Code Status: Prior  Home/SNF/Other Home Patient oriented to: self Is this baseline? Yes   Triage Complete: Triage complete  Chief Complaint Right flank pain  Triage Note Patient complains of abdominal pain that started 1 month ago and saw Dr. Arnoldo Morale. Patient had right inguinal hernia repair last year. Patient states increased pain and diarrhea due to blockage. Patient also notes nausea.   Allergies No Known Allergies  Level of Care/Admitting Diagnosis ED Disposition    ED Disposition Condition [facility] Area: [facility] [[id number]]  Level of Care: Med-Surg [16]  Diagnosis: Incarcerated right inguinal hernia [[id number]]  Admitting Physician: Aviva Signs [phone]  Attending Physician: Aviva Signs [[id number]]  PT Class (Do Not Modify): Observation [104]  PT Acc Code (Do Not Modify): Observation [10022]       B Medical/Surgery History Past Medical History:  Diagnosis Date  . Anxiety   . Diarrhea   . Hypertension   . OSA (obstructive sleep apnea)    cpap 9 cm H2O  . Stroke [facility])    Past Surgical History:  Procedure Laterality Date  . BIOPSY  06/21/2017   Procedure: BIOPSY;  Surgeon: Rogene Houston, MD;  [facility];  Service: Endoscopy;;  rectal, righta and left colon;  . COLONOSCOPY N/A 06/21/2017   Procedure: COLONOSCOPY;  Surgeon: Rogene Houston, MD;  [facility];  Service: Endoscopy;  Laterality: N/A;  1:25  . INGUINAL HERNIA REPAIR Right 04/04/2018   Procedure: HERNIA REPAIR INGUINAL ADULT WITH MESH;  Surgeon: Aviva Signs, MD;  Location: [facility];  Service: General;  Laterality: Right;  . KNEE SURGERY     tore mcl in high school (70's)     A IV Location/Drains/Wounds Patient Lines/Drains/Airways Status   Active Line/Drains/Airways    Name:   Placement date:    Placement time:   Site:   Days:   Peripheral IV 01/30/19 Left Forearm   01/30/19    1050    Forearm   less than 1          Intake/Output Last 24 hours  Intake/Output Summary (Last 24 hours) at 01/30/2019 1844 Last data filed at 01/30/2019 1520 Gross per 24 hour  Intake 500 ml  Output -  Net 500 ml    Labs/Imaging Results for orders placed or performed during the hospital encounter of 01/30/19 (from the past 48 hour(s))  Comprehensive metabolic panel     Status: Abnormal   Collection Time: 01/30/19 10:42 AM  Result Value Ref Range   Sodium 137 135 - 145 mmol/L   Potassium 4.0 3.5 - 5.1 mmol/L   Chloride 98 98 - 111 mmol/L   CO2 28 22 - 32 mmol/L   Glucose, Bld 176 (H) 70 - 99 mg/dL   BUN 27 (H) 8 - 23 mg/dL   Creatinine, Ser 1.15 0.61 - 1.24 mg/dL   Calcium 10.2 8.9 - 10.3 mg/dL   Total Protein 7.7 6.5 - 8.1 g/dL   Albumin 4.6 3.5 - 5.0 g/dL   AST 27 15 - 41 U/L   ALT 30 0 - 44 U/L   Alkaline Phosphatase 72 38 - 126 U/L   Total Bilirubin 2.0 (H) 0.3 - 1.2 mg/dL   GFR calc non Af Amer >60 >60 mL/min   GFR calc Af Amer >60 >  60 mL/min   Anion gap 11 5 - 15    Comment: Performed at [facility], [address], [place], [place] [address]  Lipase, blood     Status: None   Collection Time: 01/30/19 10:42 AM  Result Value Ref Range   Lipase 31 11 - 51 U/L    Comment: Performed at [facility], [address] [place], [place] [address]  CBC with Differential     Status: Abnormal   Collection Time: 01/30/19 10:42 AM  Result Value Ref Range   WBC 15.8 (H) 4.0 - 10.5 K/uL   RBC 6.04 (H) 4.22 - 5.81 MIL/uL   Hemoglobin 19.1 (H) 13.0 - 17.0 g/dL   HCT 55.9 (H) 39.0 - 52.0 %   MCV 92.5 80.0 - 100.0 fL   MCH 31.6 26.0 - 34.0 pg   MCHC 34.2 30.0 - 36.0 g/dL   RDW 12.6 11.5 - 15.5 %   Platelets 353 150 - 400 K/uL   nRBC 0.0 0.0 - 0.2 %   Neutrophils Relative % 88 %   Neutro Abs 13.9 (H) 1.7 - 7.7 K/uL   Lymphocytes Relative 5 %   Lymphs Abs 0.8 0.7 - 4.0 K/uL   Monocytes  Relative 7 %   Monocytes Absolute 1.1 (H) 0.1 - 1.0 K/uL   Eosinophils Relative 0 %   Eosinophils Absolute 0.0 0.0 - 0.5 K/uL   Basophils Relative 0 %   Basophils Absolute 0.1 0.0 - 0.1 K/uL   Immature Granulocytes 0 %   Abs Immature Granulocytes 0.06 0.00 - 0.07 K/uL    Comment: Performed at [facility], [address] [place], [place] [address]  Lactic acid, plasma     Status: Abnormal   Collection Time: 01/30/19 10:43 AM  Result Value Ref Range   Lactic Acid, Venous 2.6 (HH) 0.5 - 1.9 mmol/L    Comment: CRITICAL RESULT CALLED TO, READ BACK BY AND VERIFIED WITH: CRAWFORD,H@1119  BY MATTHEWS, B 3.25.2020 Performed at [facility], [address], [place], [place] [address]   Lactic acid, plasma     Status: Abnormal   Collection Time: 01/30/19 12:52 PM  Result Value Ref Range   Lactic Acid, Venous 2.4 (HH) 0.5 - 1.9 mmol/L    Comment: CRITICAL RESULT CALLED TO, READ BACK BY AND VERIFIED WITH: LASHLEY,S AT 1411 ON 3.25.20 BY ISLEY,B Performed at [facility], [address] [place], [place] [address]   Urinalysis, Routine w reflex microscopic     Status: Abnormal   Collection Time: 01/30/19  3:51 PM  Result Value Ref Range   Color, Urine AMBER (A) YELLOW    Comment: BIOCHEMICALS MAY BE AFFECTED BY COLOR   APPearance CLEAR CLEAR   Specific Gravity, Urine 1.029 1.005 - 1.030   pH 5.0 5.0 - 8.0   Glucose, UA NEGATIVE NEGATIVE mg/dL   Hgb urine dipstick NEGATIVE NEGATIVE   Bilirubin Urine NEGATIVE NEGATIVE   Ketones, ur NEGATIVE NEGATIVE mg/dL   Protein, ur NEGATIVE NEGATIVE mg/dL   Nitrite NEGATIVE NEGATIVE   Leukocytes,Ua NEGATIVE NEGATIVE    Comment: Performed at [facility], [address], [place], [place] [address]   Ct Abdomen Pelvis Wo Contrast  Result Date: 01/30/2019 CLINICAL DATA:  Generalized abdominal pain for 1 month. EXAM: CT ABDOMEN AND PELVIS WITHOUT CONTRAST TECHNIQUE: Multidetector CT imaging of the abdomen and pelvis was performed following the standard  protocol without IV contrast. COMPARISON:  CT scan of December 25, 2018. FINDINGS: Lower chest: No acute abnormality. Hepatobiliary: No focal  liver abnormality is seen. No gallstones, gallbladder wall thickening, or biliary dilatation. Pancreas: Unremarkable. No pancreatic ductal dilatation or surrounding inflammatory changes. Spleen: Normal in size without focal abnormality. Adrenals/Urinary Tract: Adrenal glands appear normal. Bilateral parapelvic renal cysts are noted. Small nonobstructive calculus seen in upper pole of right kidney. No hydronephrosis or renal obstruction is noted. No ureteral calculi are noted. Urinary bladder is unremarkable. Stomach/Bowel: Sigmoid diverticulosis is noted without inflammation. The stomach appears normal. Small bowel dilatation is again noted due to right inguinal hernia which contains terminal ileum and inferior portion of cecum, resulting in small bowel obstruction. Mild inflammatory changes are seen within the right inguinal hernia. Vascular/Lymphatic: Aortic atherosclerosis. No enlarged abdominal or pelvic lymph nodes. Reproductive: Prostate is unremarkable. Other: No abnormal fluid collection is noted. Musculoskeletal: No acute or significant osseous findings. IMPRESSION: Large right inguinal hernia is noted, which contains a portion of the inferior cecum as well as the terminal ileum, and results in small bowel obstruction. This was present on prior exam. Mild inflammatory changes are seen involving the hernia. Small nonobstructive right renal calculus is noted. No hydronephrosis or renal obstruction is noted. Sigmoid diverticulosis is noted without inflammation. Aortic Atherosclerosis (ICD10-I70.0). Electronically Signed   By: Marijo Conception, M.D.   On: 01/30/2019 15:43   Dg Abd Acute W/chest  Result Date: 01/30/2019 CLINICAL DATA:  Abdominal pain and diarrhea. Recent bowel obstruction EXAM: DG ABDOMEN ACUTE W/ 1V CHEST COMPARISON:  Chest radiograph May 19, 2015; CT  abdomen and pelvis December 25, 2018 FINDINGS: PA chest: There is slight left base atelectasis. The lungs elsewhere are clear. Heart size and pulmonary vascularity are normal. No adenopathy. Supine and upright abdomen: There are loops of dilated bowel with multiple air-fluid levels. No free air evident. There are phleboliths in the pelvis. IMPRESSION: Bowel dilatation with multiple air-fluid levels. Suspect a degree of small bowel obstruction. No free air evident. No lung edema or consolidation. There is slight left base atelectasis. Electronically Signed   By: Lowella Grip III M.D.   On: 01/30/2019 11:27    Pending Labs Unresulted Labs (From admission, onward)    Start     Ordered   Signed and Held  HIV antibody (Routine Testing)  Once,   R     Signed and Held          Vitals/Pain Today's Vitals   01/30/19 1710 01/30/19 1730 01/30/19 1800 01/30/19 1830  BP:  (!) 131/98 125/87 133/90  Pulse: 69     Resp:      Temp:      TempSrc:      SpO2: 100%     Weight:      Height:      PainSc:        Isolation Precautions No active isolations  Medications Medications  morphine 4 MG/ML injection 4 mg (4 mg Intravenous Given 01/30/19 1152)  ondansetron (ZOFRAN) injection 4 mg (4 mg Intravenous Given 01/30/19 1151)  0.9 %  sodium chloride infusion ( Intravenous New Bag/Given 01/30/19 1307)  sodium chloride 0.9 % bolus 1,000 mL (0 mLs Intravenous Stopped 01/30/19 1300)  sodium chloride 0.9 % bolus 500 mL (0 mLs Intravenous Stopped 01/30/19 1520)    Mobility walks Low fall risk   Focused Assessments    R Recommendations: See Admitting Provider Note  Report given to:   Additional Notes:

## 2019-01-30 NOTE — ED Provider Notes (Signed)
[facility] EMERGENCY DEPARTMENT Provider Note   CSN: [id number] Arrival date & time: 01/30/19  9449    History   Chief Complaint Chief Complaint  Patient presents with  . Abdominal Pain    HPI Justin [name] is a 63 y.o. male.     HPI  Pt was seen at 1015.  Per pt, c/o gradual onset and persistence of constant right sided abd "pain" for the past 2 weeks.  Has been associated with multiple intermittent episodes of diarrhea.  Describes the abd pain as "shooting." Pt states he was evaluated by his General Surgeon 2 weeks ago for this complaint and "had my hernia pushed back in." Pt states his "hernia came out" after 2 to 3 days, and has "stayed out" since that time. Pt states he was not instructed on how to reduce his hernia. Denies N/V, no fevers, no back pain, no rash, no CP/SOB, no black or blood in stools, no dysuria/hematuria, no testicular pain/swelling.      Past Medical History:  Diagnosis Date  . Anxiety   . Diarrhea   . Hypertension   . OSA (obstructive sleep apnea)    cpap 9 cm H2O  . Stroke [facility])     Patient Active Problem List   Diagnosis Date Noted  . Left sided lacunar stroke ([place]) 05/24/2018  . Cerebral embolism with cerebral infarction 04/15/2018  . Right facial numbness 04/13/2018  . Recurrent unilateral inguinal hernia with obstruction and without gangrene   . Obstructive sleep apnea treated with continuous positive airway pressure (CPAP) 03/13/2018  . Hypersomnia with sleep apnea 12/15/2017  . Diarrhea 05/08/2017  . Chest pain 05/19/2015  . Anxiety 05/19/2015  . Hyperglycemia 05/19/2015  . Hypertension     Past Surgical History:  Procedure Laterality Date  . BIOPSY  06/21/2017   Procedure: BIOPSY;  Surgeon: Rogene Houston, MD;  Location: [facility] ENDO SUITE;  Service: Endoscopy;;  rectal, righta and left colon;  . COLONOSCOPY N/A 06/21/2017   Procedure: COLONOSCOPY;  Surgeon: Rogene Houston, MD;  Location: [facility] ENDO SUITE;  Service: Endoscopy;   Laterality: N/A;  1:25  . INGUINAL HERNIA REPAIR Right 04/04/2018   Procedure: HERNIA REPAIR INGUINAL ADULT WITH MESH;  Surgeon: Aviva Signs, MD;  Location: [facility] ORS;  Service: General;  Laterality: Right;  . KNEE SURGERY     tore mcl in high school (70's)        Home Medications    Prior to Admission medications   Medication Sig Start Date End Date Taking? Authorizing Provider  aspirin 325 MG tablet Take 1 tablet (325 mg total) by mouth daily. 04/16/18  Yes Hosie Poisson, MD  atorvastatin (LIPITOR) 40 MG tablet Take 1 tablet (40 mg total) by mouth daily at 6 PM. 05/28/18  Yes Pickard, [name], MD  Cholecalciferol (VITAMIN D3) 2000 units TABS Take 2,000 Units by mouth daily.   Yes [provider]  diphenhydrAMINE (BENADRYL) 25 MG tablet Take 25 mg by mouth at bedtime as needed.    Yes [provider]  hydrochlorothiazide (HYDRODIURIL) 25 MG tablet Take 1 tablet (25 mg total) by mouth daily. 05/28/18  Yes [name], MD  ibuprofen (ADVIL,MOTRIN) 200 MG tablet Take 200 mg by mouth every 6 (six) hours as needed for fever.   Yes [provider]  losartan (COZAAR) 100 MG tablet TAKE ONE (1) TABLET BY MOUTH EVERY DAY Patient taking differently: Take 100 mg by mouth daily.  10/09/18  Yes [name], MD  Mesalamine (DELZICOL) 400 MG CPDR DR capsule Take 2 capsules (800 mg total) by mouth 2 (two) times daily. 09/18/18  Yes Rehman, Mechele Dawley, MD  metoprolol succinate (TOPROL-XL) 25 MG 24 hr tablet Take 1 tablet (25 mg total) by mouth daily. 07/26/18  Yes [name], MD  ciprofloxacin (CIPRO) 500 MG tablet Take 1 tablet (500 mg total) by mouth 2 (two) times daily. Patient not taking: Reported on 12/25/2018 12/20/18   [name], MD  metroNIDAZOLE (FLAGYL) 500 MG tablet Take 1 tablet (500 mg total) by mouth 2 (two) times daily. Patient not taking: Reported on 12/25/2018 12/20/18   [name], MD    Family History Family History  Problem Relation  Age of Onset  . Tuberculosis Mother   . Stroke Mother   . Heart disease Father        died at 55  . Bradycardia Brother        pacemaker  . Stroke Brother   . Cancer Paternal Uncle   . Mental illness Paternal Grandfather        suicide at 35  . Colon cancer Neg Hx     Social History Social History   Tobacco Use  . Smoking status: Never Smoker  . Smokeless tobacco: Never Used  Substance Use Topics  . Alcohol use: No    Alcohol/week: 0.0 standard drinks  . Drug use: No     Allergies   Patient has no known allergies.   Review of Systems Review of Systems ROS: Statement: All systems negative except as marked or noted in the HPI; Constitutional: Negative for fever and chills. ; ; Eyes: Negative for eye pain, redness and discharge. ; ; ENMT: Negative for ear pain, hoarseness, nasal congestion, sinus pressure and sore throat. ; ; Cardiovascular: Negative for chest pain, palpitations, diaphoresis, dyspnea and peripheral edema. ; ; Respiratory: Negative for cough, wheezing and stridor. ; ; Gastrointestinal: +abd pain, diarrhea. Negative for nausea, vomiting, blood in stool, hematemesis, jaundice and rectal bleeding. . ; ; Genitourinary: Negative for dysuria, flank pain and hematuria. ; ; Genital:  No penile drainage or rash, no testicular pain or swelling, no scrotal rash or swelling. ;; Musculoskeletal: Negative for back pain and neck pain. Negative for swelling and trauma.; ; Skin: Negative for pruritus, rash, abrasions, blisters, bruising and skin lesion.; ; Neuro: Negative for headache, lightheadedness and neck stiffness. Negative for weakness, altered level of consciousness, altered mental status, extremity weakness, paresthesias, involuntary movement, seizure and syncope.       Physical Exam Updated Vital Signs BP (!) 139/107 (BP Location: Left Arm)   Pulse 73   Temp 97.7 F (36.5 C) (Oral)   Resp 18   Ht 6' 1"  (1.854 m)   Wt 86.2 kg   SpO2 100%   BMI 25.07 kg/m    Physical Exam 1020: Physical examination:  Nursing notes reviewed; Vital signs and O2 SAT reviewed;  Constitutional: Well developed, Well nourished, Well hydrated, Uncomfortable appearing; Head:  Normocephalic, atraumatic; Eyes: EOMI, PERRL, No scleral icterus; ENMT: Mouth and pharynx normal, Mucous membranes moist; Neck: Supple, Full range of motion, No lymphadenopathy; Cardiovascular: Regular rate and rhythm, No gallop; Respiratory: Breath sounds clear & equal bilaterally, No wheezes.  Speaking full sentences with ease, Normal respiratory effort/excursion; Chest: Nontender, Movement normal; Abdomen: Soft, +RLQ tenderness to palp with palp right inguinal hernia. Nondistended, Normal bowel sounds; Genitourinary: No CVA tenderness. Genital exam performed with pt permission and male ED RN chaperone present during exam.  No perineal erythema.  No penile lesions or drainage.  No scrotal erythema, edema or tenderness to palp.  Normal testicular lie.  No testicular tenderness to palp.  +cremasteric reflexes bilat..;; Extremities: Peripheral pulses normal, No tenderness, No edema, No calf edema or asymmetry.; Neuro: AA&Ox3, Major CN grossly intact.  Speech clear. No gross focal motor or sensory deficits in extremities.; Skin: Color normal, Warm, Dry.   ED Treatments / Results  Labs (all labs ordered are listed, but only abnormal results are displayed)   EKG None  Radiology   Procedures Procedures (including critical care time)  Medications Ordered in ED Medications  morphine 4 MG/ML injection 4 mg (4 mg Intravenous Given 01/30/19 1152)  ondansetron (ZOFRAN) injection 4 mg (4 mg Intravenous Given 01/30/19 1151)  0.9 %  sodium chloride infusion (has no administration in time range)  sodium chloride 0.9 % bolus 1,000 mL (1,000 mLs Intravenous New Bag/Given 01/30/19 1147)     Initial Impression / Assessment and Plan / ED Course  I have reviewed the triage vital signs and the nursing notes.   Pertinent labs & imaging results that were available during my care of the patient were reviewed by me and considered in my medical decision making (see chart for details).     MDM Reviewed: previous chart, nursing note and vitals Reviewed previous: labs Interpretation: labs, x-ray and CT scan    Results for orders placed or performed during the hospital encounter of 01/30/19  Comprehensive metabolic panel  Result Value Ref Range   Sodium 137 135 - 145 mmol/L   Potassium 4.0 3.5 - 5.1 mmol/L   Chloride 98 98 - 111 mmol/L   CO2 28 22 - 32 mmol/L   Glucose, Bld 176 (H) 70 - 99 mg/dL   BUN 27 (H) 8 - 23 mg/dL   Creatinine, Ser 1.15 0.61 - 1.24 mg/dL   Calcium 10.2 8.9 - 10.3 mg/dL   Total Protein 7.7 6.5 - 8.1 g/dL   Albumin 4.6 3.5 - 5.0 g/dL   AST 27 15 - 41 U/L   ALT 30 0 - 44 U/L   Alkaline Phosphatase 72 38 - 126 U/L   Total Bilirubin 2.0 (H) 0.3 - 1.2 mg/dL   GFR calc non Af Amer >60 >60 mL/min   GFR calc Af Amer >60 >60 mL/min   Anion gap 11 5 - 15  Lipase, blood  Result Value Ref Range   Lipase 31 11 - 51 U/L  Lactic acid, plasma  Result Value Ref Range   Lactic Acid, Venous 2.6 (HH) 0.5 - 1.9 mmol/L  CBC with Differential  Result Value Ref Range   WBC 15.8 (H) 4.0 - 10.5 K/uL   RBC 6.04 (H) 4.22 - 5.81 MIL/uL   Hemoglobin 19.1 (H) 13.0 - 17.0 g/dL   HCT 55.9 (H) 39.0 - 52.0 %   MCV 92.5 80.0 - 100.0 fL   MCH 31.6 26.0 - 34.0 pg   MCHC 34.2 30.0 - 36.0 g/dL   RDW 12.6 11.5 - 15.5 %   Platelets 353 150 - 400 K/uL   nRBC 0.0 0.0 - 0.2 %   Neutrophils Relative % 88 %   Neutro Abs 13.9 (H) 1.7 - 7.7 K/uL   Lymphocytes Relative 5 %   Lymphs Abs 0.8 0.7 - 4.0 K/uL   Monocytes Relative 7 %   Monocytes Absolute 1.1 (H) 0.1 - 1.0 K/uL   Eosinophils Relative 0 %   Eosinophils Absolute 0.0 0.0 - 0.5 K/uL  Basophils Relative 0 %   Basophils Absolute 0.1 0.0 - 0.1 K/uL   Immature Granulocytes 0 %   Abs Immature Granulocytes 0.06 0.00 - 0.07 K/uL   Dg Abd Acute  W/chest Result Date: 01/30/2019 CLINICAL DATA:  Abdominal pain and diarrhea. Recent bowel obstruction EXAM: DG ABDOMEN ACUTE W/ 1V CHEST COMPARISON:  Chest radiograph May 19, 2015; CT abdomen and pelvis December 25, 2018 FINDINGS: PA chest: There is slight left base atelectasis. The lungs elsewhere are clear. Heart size and pulmonary vascularity are normal. No adenopathy. Supine and upright abdomen: There are loops of dilated bowel with multiple air-fluid levels. No free air evident. There are phleboliths in the pelvis. IMPRESSION: Bowel dilatation with multiple air-fluid levels. Suspect a degree of small bowel obstruction. No free air evident. No lung edema or consolidation. There is slight left base atelectasis. Electronically Signed   By: Lowella Grip III M.D.   On: 01/30/2019 11:27    1050:  Unable to fully reduce right inguinal hernia. General Surgeon Dr. Arnoldo Morale in the ED: states he was called by family to say that pt was coming to ED, he evaluated pt and was able to reduce hernia. Given reported diarrhea x2 weeks, Dx testing was ordered and is pending.  1200:   AXR as above. Follow up CT A/P ordered. T/C to General Surgery Dr. Arnoldo Morale: made aware CT ordered.   1530:  Lactic acid trending down with IVF boluses. CT A/P pending. Sign out to Dr. Laverta Baltimore.         Final Clinical Impressions(s) / ED Diagnoses   Final diagnoses:  None    ED Discharge Orders    None       Francine Graven, DO 01/30/19 1550

## 2019-01-30 NOTE — ED Notes (Signed)
Date and time results received: 01/30/19 2:11 PM  Test: Lactic Acid Critical Value: 2.4  Name of Provider Notified: Thurnell Garbe  Orders Received? Or Actions Taken?: No new orders at this time

## 2019-01-30 NOTE — ED Notes (Signed)
Date and time results received: 01/30/19 1119 (use smartphrase ".now" to insert current time)  Test: lactic acid Critical Value: 2.6  Name of Provider Notified: Dr Ladoris Gene  Orders Received? Or Actions Taken?: na

## 2019-01-30 NOTE — ED Triage Notes (Signed)
Patient complains of abdominal pain that started 1 month ago and saw Dr. Arnoldo Morale. Patient had right inguinal hernia repair last year. Patient states increased pain and diarrhea due to blockage. Patient also notes nausea.

## 2019-01-30 NOTE — ED Provider Notes (Signed)
Blood pressure 125/86, pulse 79, temperature 97.7 F (36.5 C), temperature source Oral, resp. rate 18, height 6' 1"  (1.854 m), weight 86.2 kg, SpO2 98 %.  Assuming care from Dr. Thurnell Garbe.  In short, Justin [name] is a 63 y.o. male with a chief complaint of Abdominal Pain .  Refer to the original H&P for additional details.  The current plan of care is to f/u on CT and speak with Dr. Arnoldo Morale.  03:55 PM  Spoke with Dr. Arnoldo Morale regarding the scan. He is in the OR now but will be down to see the patient in the ED.   Discussed patient's case with General Surgery, Dr. Arnoldo Morale to request admission. Patient and family (if present) updated with plan. Care transferred to Surgery service.  I reviewed all nursing notes, vitals, pertinent old records, EKGs, labs, imaging (as available).     Margette Fast, MD 01/30/19 [phone]

## 2019-01-31 ENCOUNTER — Observation Stay (HOSPITAL_COMMUNITY): Payer: BC Managed Care – PPO | Admitting: Anesthesiology

## 2019-01-31 ENCOUNTER — Encounter (HOSPITAL_COMMUNITY): Payer: Self-pay

## 2019-01-31 ENCOUNTER — Encounter (HOSPITAL_COMMUNITY)
Admission: EM | Disposition: A | Discharge status: Home / Self Care | Payer: Self-pay | Source: Home / Self Care | Attending: Emergency Medicine

## 2019-01-31 DIAGNOSIS — K56609 Unspecified intestinal obstruction, unspecified as to partial versus complete obstruction: Secondary | ICD-10-CM | POA: Diagnosis not present

## 2019-01-31 DIAGNOSIS — K4030 Unilateral inguinal hernia, with obstruction, without gangrene, not specified as recurrent: Secondary | ICD-10-CM | POA: Diagnosis not present

## 2019-01-31 HISTORY — PX: INGUINAL HERNIA REPAIR: SHX194

## 2019-01-31 SURGERY — REPAIR, HERNIA, INGUINAL, ADULT
Anesthesia: General | Laterality: Right

## 2019-01-31 MED ORDER — PROMETHAZINE HCL 25 MG/ML IJ SOLN: 6.2500 mg | INTRAMUSCULAR | Status: DC | PRN

## 2019-01-31 MED ORDER — EPHEDRINE 5 MG/ML INJ
INTRAVENOUS | Status: AC
  Filled 2019-01-31: qty 10

## 2019-01-31 MED ORDER — FENTANYL CITRATE (PF) 100 MCG/2ML IJ SOLN
INTRAMUSCULAR | Status: DC | PRN
  Administered 2019-01-31: 25 ug via INTRAVENOUS
  Administered 2019-01-31: 50 ug via INTRAVENOUS
  Administered 2019-01-31: 25 ug via INTRAVENOUS

## 2019-01-31 MED ORDER — KETOROLAC TROMETHAMINE 30 MG/ML IJ SOLN: 30.0000 mg | Freq: Four times a day (QID) | INTRAMUSCULAR | Status: DC | PRN

## 2019-01-31 MED ORDER — MEPERIDINE HCL 50 MG/ML IJ SOLN: 6.2500 mg | INTRAMUSCULAR | Status: DC | PRN

## 2019-01-31 MED ORDER — KETOROLAC TROMETHAMINE 30 MG/ML IJ SOLN: 30.0000 mg | Freq: Four times a day (QID) | INTRAMUSCULAR | Status: DC

## 2019-01-31 MED ORDER — HYDROMORPHONE HCL 1 MG/ML IJ SOLN: 1.0000 mg | INTRAMUSCULAR | Status: DC | PRN

## 2019-01-31 MED ORDER — PROPOFOL 10 MG/ML IV BOLUS
INTRAVENOUS | Status: AC
  Filled 2019-01-31: qty 20

## 2019-01-31 MED ORDER — ENOXAPARIN SODIUM 40 MG/0.4ML ~~LOC~~ SOLN: 40.0000 mg | SUBCUTANEOUS | Status: DC

## 2019-01-31 MED ORDER — ONDANSETRON HCL 4 MG/2ML IJ SOLN: 4.0000 mg | Freq: Four times a day (QID) | INTRAMUSCULAR | Status: DC | PRN

## 2019-01-31 MED ORDER — SIMETHICONE 80 MG PO CHEW: 40.0000 mg | CHEWABLE_TABLET | Freq: Four times a day (QID) | ORAL | Status: DC | PRN

## 2019-01-31 MED ORDER — SODIUM CHLORIDE 0.9 % IV SOLN
INTRAVENOUS | Status: DC
  Administered 2019-01-31: 11:00:00 via INTRAVENOUS

## 2019-01-31 MED ORDER — HYDROMORPHONE HCL 1 MG/ML IJ SOLN: 0.2500 mg | INTRAMUSCULAR | Status: DC | PRN

## 2019-01-31 MED ORDER — BUPIVACAINE LIPOSOME 1.3 % IJ SUSP
INTRAMUSCULAR | Status: AC
  Filled 2019-01-31: qty 20

## 2019-01-31 MED ORDER — SODIUM CHLORIDE 0.9 % IR SOLN
Status: DC | PRN
  Administered 2019-01-31: 1000 mL

## 2019-01-31 MED ORDER — BUPIVACAINE LIPOSOME 1.3 % IJ SUSP
INTRAMUSCULAR | Status: DC | PRN
  Administered 2019-01-31: 20 mL

## 2019-01-31 MED ORDER — ONDANSETRON 4 MG PO TBDP: 4.0000 mg | ORAL_TABLET | Freq: Four times a day (QID) | ORAL | Status: DC | PRN

## 2019-01-31 MED ORDER — HYDROCODONE-ACETAMINOPHEN 7.5-325 MG PO TABS: 1.0000 | ORAL_TABLET | Freq: Once | ORAL | Status: DC | PRN

## 2019-01-31 MED ORDER — LACTATED RINGERS IV SOLN: INTRAVENOUS | Status: DC

## 2019-01-31 MED ORDER — LACTATED RINGERS IV SOLN
INTRAVENOUS | Status: DC
  Administered 2019-01-31 (×2): via INTRAVENOUS

## 2019-01-31 MED ORDER — HYDROCODONE-ACETAMINOPHEN 5-325 MG PO TABS: 1.0000 | ORAL_TABLET | ORAL | 0 refills | Status: DC | PRN

## 2019-01-31 MED ORDER — PROPOFOL 10 MG/ML IV BOLUS
INTRAVENOUS | Status: DC | PRN
  Administered 2019-01-31: 200 mg via INTRAVENOUS

## 2019-01-31 MED ORDER — EPHEDRINE SULFATE 50 MG/ML IJ SOLN
INTRAMUSCULAR | Status: DC | PRN
  Administered 2019-01-31: 5 mg via INTRAVENOUS
  Administered 2019-01-31: 10 mg via INTRAVENOUS
  Administered 2019-01-31 (×3): 5 mg via INTRAVENOUS

## 2019-01-31 MED ORDER — FENTANYL CITRATE (PF) 100 MCG/2ML IJ SOLN
INTRAMUSCULAR | Status: AC
  Filled 2019-01-31: qty 2

## 2019-01-31 MED ORDER — SEVOFLURANE IN SOLN
RESPIRATORY_TRACT | Status: AC
  Filled 2019-01-31: qty 250

## 2019-01-31 MED ORDER — KETOROLAC TROMETHAMINE 30 MG/ML IJ SOLN
30.0000 mg | Freq: Once | INTRAMUSCULAR | Status: AC
  Administered 2019-01-31: 30 mg via INTRAVENOUS
  Filled 2019-01-31: qty 1

## 2019-01-31 MED ORDER — HYDROCODONE-ACETAMINOPHEN 5-325 MG PO TABS: 1.0000 | ORAL_TABLET | ORAL | Status: DC | PRN

## 2019-01-31 SURGICAL SUPPLY — 36 items
ADH SKN CLS APL DERMABOND .7 (GAUZE/BANDAGES/DRESSINGS) ×1
CLOTH BEACON ORANGE TIMEOUT ST (SAFETY) ×3 IMPLANT
COVER LIGHT HANDLE STERIS (MISCELLANEOUS) ×6 IMPLANT
COVER WAND RF STERILE (DRAPES) ×2 IMPLANT
DERMABOND ADVANCED (GAUZE/BANDAGES/DRESSINGS) ×2
DERMABOND ADVANCED .7 DNX12 (GAUZE/BANDAGES/DRESSINGS) ×1 IMPLANT
DRAIN PENROSE 18X1/2 LTX STRL (DRAIN) ×3 IMPLANT
ELECT REM PT RETURN 9FT ADLT (ELECTROSURGICAL) ×3
ELECTRODE REM PT RTRN 9FT ADLT (ELECTROSURGICAL) ×1 IMPLANT
GAUZE SPONGE 4X4 12PLY STRL (GAUZE/BANDAGES/DRESSINGS) ×3 IMPLANT
GLOVE BIOGEL M 7.0 STRL (GLOVE) ×2 IMPLANT
GLOVE BIOGEL PI IND STRL 7.0 (GLOVE) ×2 IMPLANT
GLOVE BIOGEL PI INDICATOR 7.0 (GLOVE) ×6
GLOVE ECLIPSE 6.5 STRL STRAW (GLOVE) ×4 IMPLANT
GLOVE SURG SS PI 7.5 STRL IVOR (GLOVE) ×3 IMPLANT
GOWN STRL REUS W/TWL LRG LVL3 (GOWN DISPOSABLE) ×11 IMPLANT
INST SET MINOR GENERAL (KITS) ×3 IMPLANT
KIT TURNOVER KIT A (KITS) ×3 IMPLANT
LIGASURE IMPACT 36 18CM CVD LR (INSTRUMENTS) ×2 IMPLANT
MANIFOLD NEPTUNE II (INSTRUMENTS) ×3 IMPLANT
MESH MARLEX PLUG MEDIUM (Mesh General) ×4 IMPLANT
NDL HYPO 21X1.5 SAFETY (NEEDLE) ×1 IMPLANT
NEEDLE HYPO 21X1.5 SAFETY (NEEDLE) ×3 IMPLANT
NS IRRIG 1000ML POUR BTL (IV SOLUTION) ×3 IMPLANT
PACK MINOR (CUSTOM PROCEDURE TRAY) ×3 IMPLANT
PAD ARMBOARD 7.5X6 YLW CONV (MISCELLANEOUS) ×3 IMPLANT
SET BASIN LINEN APH (SET/KITS/TRAYS/PACK) ×3 IMPLANT
SOL PREP PROV IODINE SCRUB 4OZ (MISCELLANEOUS) ×3 IMPLANT
SUT MNCRL AB 4-0 PS2 18 (SUTURE) ×3 IMPLANT
SUT NOVA NAB GS-22 2 2-0 T-19 (SUTURE) ×2 IMPLANT
SUT VIC AB 2-0 CT1 27 (SUTURE) ×3
SUT VIC AB 2-0 CT1 TAPERPNT 27 (SUTURE) ×1 IMPLANT
SUT VIC AB 3-0 SH 27 (SUTURE) ×6
SUT VIC AB 3-0 SH 27X BRD (SUTURE) ×1 IMPLANT
SYR 20CC LL (SYRINGE) ×3 IMPLANT
SYR 30ML LL (SYRINGE) ×2 IMPLANT

## 2019-01-31 NOTE — Anesthesia Preprocedure Evaluation (Signed)
Anesthesia Evaluation    Airway Mallampati: IV     Mouth opening: Limited Mouth Opening  Dental  (+) Teeth Intact, Implants   Pulmonary sleep apnea ,    breath sounds clear to auscultation       Cardiovascular hypertension, On Medications  Rhythm:regular     Neuro/Psych Anxiety CVA, Residual Symptoms    GI/Hepatic   Endo/Other    Renal/GU      Musculoskeletal   Abdominal   Peds  Hematology   Anesthesia Other Findings Recurrent incarceration, same side as repair 5/19 CVA week after 5/19 hernia sx, did not restart ASA IF ETT, required will need Glidescope, to d/w SX  Reproductive/Obstetrics                             Anesthesia Physical Anesthesia Plan  ASA: III  Anesthesia Plan: General   Post-op Pain Management:    Induction:   PONV Risk Score and Plan:   Airway Management Planned:   Additional Equipment:   Intra-op Plan:   Post-operative Plan:   Informed Consent:     Dental Advisory Given  Plan Discussed with: Anesthesiologist  Anesthesia Plan Comments:         Anesthesia Quick Evaluation

## 2019-01-31 NOTE — Progress Notes (Signed)
Patient returns from pacu at this time.  Settled in bed with siderails up x2 and callbell in reach.  Patient has no requests at this time

## 2019-01-31 NOTE — Plan of Care (Signed)

## 2019-01-31 NOTE — Anesthesia Postprocedure Evaluation (Signed)
Anesthesia Post Note  Patient: Justin [name]  Procedure(s) Performed: RECURRENT RIGHT INGUINAL HERNIORRHAPY WITH MESH, INCARCERATED (Right )  Patient location during evaluation: PACU Anesthesia Type: General Level of consciousness: awake and alert and patient cooperative Pain management: satisfactory to patient Respiratory status: spontaneous breathing Cardiovascular status: stable Postop Assessment: no apparent nausea or vomiting Anesthetic complications: no     Last Vitals:  Vitals:   01/31/19 1100 01/31/19 1148  BP: (!) 113/59 109/77  Pulse:  69  Resp: 16 19  Temp: 37.1 C 36.7 C  SpO2:  100%    Last Pain:  Vitals:   01/31/19 1148  TempSrc: Oral  PainSc:                  Drucie Opitz

## 2019-01-31 NOTE — Transfer of Care (Signed)
Immediate Anesthesia Transfer of Care Note  Patient: Justin [name]  Procedure(s) Performed: RECURRENT RIGHT INGUINAL HERNIORRHAPY WITH MESH, INCARCERATED (Right )  Patient Location: PACU  Anesthesia Type:General  Level of Consciousness: awake and patient cooperative  Airway & Oxygen Therapy: Patient Spontanous Breathing  Post-op Assessment: Report given to RN and Post -op Vital signs reviewed and stable  Post vital signs: Reviewed and stable  Last Vitals:  Vitals Value Taken Time  BP 113/72 01/31/2019 10:54 AM  Temp    Pulse 70 01/31/2019 10:56 AM  Resp 20 01/31/2019 10:55 AM  SpO2 92 % 01/31/2019 10:56 AM  Vitals shown include unvalidated device data.  Last Pain:  Vitals:   01/31/19 0729  TempSrc: Oral  PainSc: 0-No pain      Patients Stated Pain Goal: 7 (13/64/38 3779)  Complications: No apparent anesthesia complications

## 2019-01-31 NOTE — Anesthesia Procedure Notes (Signed)
Procedure Name: LMA Insertion Date/Time: 01/31/2019 9:13 AM Performed by: Vista Deck, CRNA Pre-anesthesia Checklist: Patient identified, Patient being monitored, Emergency Drugs available, Timeout performed and Suction available Patient Re-evaluated:Patient Re-evaluated prior to induction Oxygen Delivery Method: Circle System Utilized Preoxygenation: Pre-oxygenation with 100% oxygen Induction Type: IV induction LMA: LMA inserted LMA Size: 5.0 Number of attempts: 1 Placement Confirmation: positive ETCO2 and breath sounds checked- equal and bilateral Tube secured with: Tape Dental Injury: Teeth and Oropharynx as per pre-operative assessment

## 2019-01-31 NOTE — Op Note (Signed)
Patient:  Justin [name]  DOB:  1956-06-29  MRN:  [id number]   Preop Diagnosis: Recurrent incarcerated right inguinal hernia, partial small bowel obstruction  Postop Diagnosis: Same  Procedure: Recurrent incarcerated right inguinal herniorrhaphy with mesh  Surgeon: Aviva Signs, MD  Anes: General  Indications: Patient is a 63 year old white male status post right inguinal herniorrhaphy in the past who presented to [facility] with worsening nausea and vomiting.  He did have an incarcerated right inguinal hernia which would reduce but recur almost immediately.  CT scan of the abdomen revealed partial bowel obstruction due to the hernia.  He also has an elevated lactic acid level due to dehydration.  He was admitted to the hospital for rehydration and IV antibiotics.  He now presents for a right inguinal herniorrhaphy with mesh for recurrence.  The risks and benefits of the procedure including bleeding, infection, mesh use, and the possibility of recurrence of the hernia as well as the possibility of a bowel resection were fully explained to the patient, who gave informed consent.  Procedure note: The patient was placed in supine position.  After general anesthesia was administered, the right groin region was prepped and draped using the usual sterile technique with Betadine.  Surgical site confirmation was performed.  An incision was made through the previous surgical incision site in the right groin.  This was taken down to the external oblique aponeurosis.  There was a significant amount of scarring in this area and this was sharply dissected off the spermatic cord.  It appeared that the patient had developed a narrow indirect hernia.  The hernia was freed away up to the peritoneal reflection and inverted.  The previously placed mesh that was along the floor of the inguinal canal medially was intact except for a small defect around the spermatic cord.  A medium size Bard PerFix plug was  inserted just lateral to the spermatic cord and secured in place using a 2-0 Novafil interrupted suture.  Another medium Bard PerFix plug was placed along the medial aspect of the spermatic cord.  I then placed an onlay mesh patch along the floor of the inguinal canal and the internal ring was re-created using a 2-0 Novafil interrupted suture.  The external oblique aponeurosis was reapproximated using a 2-0 Vicryl running suture.  Exparel was instilled into the surrounding wound.  The skin was closed using a 4-0 Monocryl subcuticular suture.  Dermabond was applied.  All tape and needle counts were correct at the end of the procedure.  The patient was awakened and transferred to PACU in stable condition.  Complications: None  EBL: 25 cc  Specimen: None

## 2019-01-31 NOTE — Discharge Instructions (Signed)
Hernia, Adult     A hernia happens when tissue inside your body pushes out through a weak spot in your belly muscles (abdominal wall). This makes a round lump (bulge). The lump may be:  In a scar from surgery that was done in your belly (incisional hernia).  Near your belly button (umbilical hernia).  In your groin (inguinal hernia). Your groin is the area where your leg meets your lower belly (abdomen). This kind of hernia could also be: ? In your scrotum, if you are male. ? In folds of skin around your vagina, if you are male.  In your upper thigh (femoral hernia).  Inside your belly (hiatal hernia). This happens when your stomach slides above the muscle between your belly and your chest (diaphragm). If your hernia is small and it does not cause pain, you may not need treatment. If your hernia is large or it causes pain, you may need surgery. Follow these instructions at home: Activity  Avoid stretching or overusing (straining) the muscles near your hernia. Straining can happen when you: ? Lift something heavy. ? Poop (have a bowel movement).  Do not lift anything that is heavier than 10 lb (4.5 kg), or the limit that you are told, until your doctor says that it is safe.  Use the strength of your legs when you lift something heavy. Do not use only your back muscles to lift. General instructions  Do these things if told by your doctor so you do not have trouble pooping (constipation): ? Drink enough fluid to keep your pee (urine) pale yellow. ? Eat foods that are high in fiber. These include fresh fruits and vegetables, whole grains, and beans. ? Limit foods that are high in fat and processed sugars. These include foods that are fried or sweet. ? Take medicine for trouble pooping.  When you cough, try to cough gently.  You may try to push your hernia in by very gently pressing on it when you are lying down. Do not try to force the bulge back in if it will not push in  easily.  If you are overweight, work with your doctor to lose weight safely.  Do not use any products that have nicotine or tobacco in them. These include cigarettes and e-cigarettes. If you need help quitting, ask your doctor.  If you will be having surgery (hernia repair), watch your hernia for changes in shape, size, or color. Tell your doctor if you see any changes.  Take over-the-counter and prescription medicines only as told by your doctor.  Keep all follow-up visits as told by your doctor. Contact a doctor if:  You get new pain, swelling, or redness near your hernia.  You poop fewer times in a week than normal.  You have trouble pooping.  You have poop (stool) that is more dry than normal.  You have poop that is harder or larger than normal. Get help right away if:  You have a fever.  You have belly pain that gets worse.  You feel sick to your stomach (nauseous).  You throw up (vomit).  Your hernia cannot be pushed in by very gently pressing on it when you are lying down. Do not try to force the bulge back in if it will not push in easily.  Your hernia: ? Changes in shape or size. ? Changes color. ? Feels hard or it hurts when you touch it. These symptoms may represent a serious problem that is an emergency. Do not  wait to see if the symptoms will go away. Get medical help right away. Call your local emergency services (911 in the [place] Summary  A hernia happens when tissue inside your body pushes out through a weak spot in the belly muscles. This creates a bulge.  If your hernia is small and it does not hurt, you may not need treatment. If your hernia is large or it hurts, you may need surgery.  If you will be having surgery, watch your hernia for changes in shape, size, or color. Tell your doctor about any changes. This information is not intended to replace advice given to you by your health care provider. Make sure you discuss any questions you have with  your health care provider. Document Released: 04/13/2010 Document Revised: 07/26/2017 Document Reviewed: 07/26/2017 Elsevier Interactive Patient Education  2019 [employer].

## 2019-01-31 NOTE — Interval H&P Note (Signed)
History and Physical Interval Note:  01/31/2019 7:54 AM  Justin [name]  has presented today for surgery, with the diagnosis of recurrent incarcerated inguinal hernia.  The various methods of treatment have been discussed with the patient and family. After consideration of risks, benefits and other options for treatment, the patient has consented to  Procedure(s): HERNIA REPAIR INGUINAL ADULT (Right) as a surgical intervention.  The patient's history has been reviewed, patient examined, no change in status, stable for surgery.  I have reviewed the patient's chart and labs.  Questions were answered to the patient's satisfaction.     Aviva Signs

## 2019-02-01 LAB — HIV ANTIBODY (ROUTINE TESTING W REFLEX): HIV Screen 4th Generation wRfx: NONREACTIVE

## 2019-02-01 NOTE — Discharge Summary (Signed)
Physician Discharge Summary  Patient ID: Justin [name] MRN: [id number] DOB/AGE: [birth year] 63 y.o.  Admit date: 01/30/2019 Discharge date: 01/31/2019 Admission Diagnoses: Incarcerated recurrent right inguinal hernia, partial bowel obstruction  Discharge Diagnoses: Same Active Problems:   Incarcerated right inguinal hernia   SBO (small bowel obstruction) [facility])   Discharged Condition: good  Hospital Course: Patient is a 63 year old white male who presented to the emergency room with worsening nausea, vomiting, and right groin swelling.  He had a known recurrent right inguinal hernia.  This was somewhat reducible in the emergency room, but kept recurring.  CT scan of the abdomen revealed a partial bowel obstruction due to the recurrent right inguinal hernia.  He was noted to be dehydrated with a slightly elevated lactic acid level.  The patient was admitted to the hospital on 01/30/2019 for rehydration and IV antibiotics.  He subsequently underwent a recurrent right inguinal herniorrhaphy with mesh for incarceration on 01/31/2019.  Tolerated surgery well.  His postoperative course was unremarkable.  He was able to void and tolerate a diet.  He was discharged home on 01/31/2019 in good and improving condition.  Treatments: surgery: Recurrent right inguinal herniorrhaphy with mesh for incarceration on 01/31/2019  Discharge Exam: Blood pressure 109/77, pulse 69, temperature 98 F (36.7 C), temperature source Oral, resp. rate 19, height 6' 1"  (1.854 m), weight 87.7 kg, SpO2 100 %. General appearance: alert, cooperative and no distress Resp: clear to auscultation bilaterally Cardio: regular rate and rhythm, S1, S2 normal, no murmur, click, rub or gallop GI: Soft, incision healing well.  Disposition:   Discharge Instructions    Diet - low sodium heart healthy   Complete by:  As directed    Diet - low sodium heart healthy   Complete by:  As directed    Increase activity slowly   Complete by:  As  directed    Increase activity slowly   Complete by:  As directed      Allergies as of 01/31/2019   No Known Allergies     Medication List    TAKE these medications   aspirin 325 MG tablet Take 1 tablet (325 mg total) by mouth daily.   atorvastatin 40 MG tablet Commonly known as:  LIPITOR Take 1 tablet (40 mg total) by mouth daily at 6 PM.   ciprofloxacin 500 MG tablet Commonly known as:  Cipro Take 1 tablet (500 mg total) by mouth 2 (two) times daily.   diphenhydrAMINE 25 MG tablet Commonly known as:  BENADRYL Take 25 mg by mouth at bedtime as needed.   hydrochlorothiazide 25 MG tablet Commonly known as:  HYDRODIURIL Take 1 tablet (25 mg total) by mouth daily.   HYDROcodone-acetaminophen 5-325 MG tablet Commonly known as:  Norco Take 1 tablet by mouth every 4 (four) hours as needed for moderate pain.   ibuprofen 200 MG tablet Commonly known as:  ADVIL,MOTRIN Take 200 mg by mouth every 6 (six) hours as needed for fever.   losartan 100 MG tablet Commonly known as:  COZAAR TAKE ONE (1) TABLET BY MOUTH EVERY DAY What changed:  See the new instructions.   Mesalamine 400 MG Cpdr DR capsule Commonly known as:  Delzicol Take 2 capsules (800 mg total) by mouth 2 (two) times daily.   metoprolol succinate 25 MG 24 hr tablet Commonly known as:  TOPROL-XL Take 1 tablet (25 mg total) by mouth daily.   metroNIDAZOLE 500 MG tablet Commonly known as:  FLAGYL Take 1 tablet (500 mg total)  by mouth 2 (two) times daily.   Vitamin D3 50 MCG (2000 UT) Tabs Take 2,000 Units by mouth daily.      Follow-up Information    Aviva Signs, MD.   Specialty:  General Surgery Why:  Will call you next week for follow up. Contact information: [id number] [place] [address] [phone]           Signed: Aviva Signs 02/01/2019, 7:26 AM

## 2019-02-04 ENCOUNTER — Encounter (HOSPITAL_COMMUNITY): Payer: Self-pay | Admitting: General Surgery

## 2019-04-27 ENCOUNTER — Other Ambulatory Visit: Payer: Self-pay | Admitting: Family Medicine

## 2019-05-20 ENCOUNTER — Other Ambulatory Visit: Payer: Self-pay

## 2019-05-20 ENCOUNTER — Ambulatory Visit: Payer: BC Managed Care – PPO | Admitting: Family Medicine

## 2019-05-20 ENCOUNTER — Encounter: Payer: Self-pay | Admitting: Family Medicine

## 2019-05-20 VITALS — BP 142/86 | HR 60 | Temp 98.4°F | Resp 18 | Ht 73.0 in | Wt 197.0 lb

## 2019-05-20 DIAGNOSIS — I6930 Unspecified sequelae of cerebral infarction: Secondary | ICD-10-CM | POA: Diagnosis not present

## 2019-05-20 DIAGNOSIS — I6381 Other cerebral infarction due to occlusion or stenosis of small artery: Secondary | ICD-10-CM

## 2019-05-20 DIAGNOSIS — E7800 Pure hypercholesterolemia, unspecified: Secondary | ICD-10-CM | POA: Diagnosis not present

## 2019-05-20 DIAGNOSIS — Z125 Encounter for screening for malignant neoplasm of prostate: Secondary | ICD-10-CM

## 2019-05-20 DIAGNOSIS — I10 Essential (primary) hypertension: Secondary | ICD-10-CM | POA: Diagnosis not present

## 2019-05-20 NOTE — Progress Notes (Signed)
Subjective:    Patient ID: Justin [name], male    DOB: 1956-01-07, 63 y.o.   MRN: [id number]  HPI Unfortunately, the patient recently suffered a stroke and was admitted to the hospital.  I have copied relevant portions of the discharge summary below and included them for my reference.:  Admit date: 04/13/2018 Discharge date: 04/15/2018 Recommendations for Outpatient Follow-up:  1. Follow up with PCP in 1-2 weeks 2. Please obtain BMP/CBC in one week Please follow up with neurology as recommended.   Brief/Interim Summary: Justin [name] a 63 y.o.malewith a history of HTN, OSA with CPAP, anxietyreported numbness of the right face, right upper extremity and right lower extremity since Wednesday. He was admitted for evaluation of stroke.  Discharge Diagnoses:  Principal Problem:   Right facial numbness Active Problems:   Hyperglycemia   Hypertension   Non-recurrent unilateral inguinal hernia without obstruction or gangrene   Cerebral embolism with cerebral infarction  Right face/ upper extremity numbness: Right sided sensory deficits persistent.  CT head negative.  MRI BRAIN showedAcute lacunar infarct in the lateral Left thalamus. No associated hemorrhage or mass effect.Negative intracranialand neckMRA. Echocardiogram not significant for cardiac source of thrombus or embolism.  hgba1c is 5.2.  LDL is 88.  Continue with aspirin 325 mg daily and statin on discharge.  Neurology consulted and therapy evals  Recommending outpatient physical and occupational therapy.  + Medication List    STOP taking these medications   ALEVE 220 MG tablet Generic drug:  naproxen sodium   aspirin EC 81 MG tablet Replaced by:  aspirin 325 MG tablet     TAKE these medications   aspirin 325 MG tablet Take 1 tablet (325 mg total) by mouth daily. Replaces:  aspirin EC 81 MG tablet   atorvastatin 40 MG tablet Commonly known as:  LIPITOR Take 1 tablet (40 mg total) by mouth daily  at 6 PM.   BIOFREEZE ROLL-ON EX Apply 1 application topically daily as needed (for knee pain).   DELZICOL 400 MG Cpdr DR capsule Generic drug:  Mesalamine Take 800 mg by mouth 2 (two) times daily.   losartan 100 MG tablet Commonly known as:  COZAAR TAKE ONE (1) TABLET BY MOUTH EVERY DAY   vitamin C 500 MG tablet Commonly known as:  ASCORBIC ACID Take 500 mg by mouth daily.   Vitamin D3 2000 units Tabs Take 2,000 Units by mouth daily.    Patient is here today for follow-up.  As stated above, he suffered a lacunar infarct in the left thalamus.  He continues to have residual right facial numbness and right arm numbness.  The numbness in his right leg is improving slightly.  He has no weakness in his extremities.  He has no a aphasia or word finding difficulties or memory loss.  He is currently taking aspirin 325 mg a day, Lipitor 40 mg a day, and losartan 100 mg a day.  His blood pressure here today is elevated 150/100 however he is been monitoring his blood pressure at home and his blood pressure today at home was 142/88.  He is received similar readings throughout the last week.  He has had no further strokelike symptoms or TIA symptoms.  Overall he is doing well aside from some pain in his right hand at the DIP and PIP joints most likely due to osteoarthritis.  At that time, my plan was: Patient is currently taking 325 mg aspirin.  He has a follow-up appointment scheduled to see neurology  in 4 weeks.  At that time, I will defer to the neurologist but I anticipate that aspirin will be decreased from 325 mg a day to 81 mg a day.  When the patient had his lacunar infarct, he was on no antiplatelet agent as he had just stopped the medication prior to surgery.  Therefore I do not see this is a failure of aspirin and I believe 81 milligrams a day would be sufficient.  I explained to the patient that he should remain on high-dose statin indefinitely despite having relatively good cholesterol due  to its pleiotropic effects at prevention of stroke and heart attack.  Ideally I would like to see his blood pressure in the 130s over 80s however he is just recently suffered a lacunar infarct.  Therefore I recommended that we allow his blood pressure to remain slightly elevated in the 140s over 90s for at least the first few weeks.  After that, if his blood pressure continues to remain elevated in the 140s over 90s, I would add hydrochlorothiazide with an ultimate goal blood pressure of 130/80.  Patient will call back with his blood pressure in 1 week.  I will obtain a CBC and a CMP today  05/28/18 Patient is here today for follow-up.  His blood pressure has been elevated at home.  His average blood pressure is been 145-155/90-95.  He is compliant with his losartan.  He denies any symptoms.  He denies any chest pain shortness of breath or dyspnea on exertion.  Recently saw his neurologist and at her office it was elevated at 163 systolic over 845.  He is currently taking aspirin 325 mg a day for secondary prevention of stroke.  At that time, my plan was: Add hydrochlorothiazide 25 mg a day to losartan and recheck blood pressure in 1 month.  Return fasting in September for a CBC, CMP, and fasting lipid panel.  Continue Lipitor and aspirin for secondary stroke prevention.  I did recommend switching to a baby aspirin after the patient has completed 3 months of aspirin 325 mg a day.    05/20/19 Patient is here today for routine follow-up for fasting lab work.  Overall he is been doing well.  He is extremely busy with work and continues to work every day.  He denies any chest pain shortness of breath or dyspnea on exertion.  He is currently taking aspirin 325 mg a day for secondary prevention of stroke.  He denies any bleeding.  He denies any melena or hematochezia.  He is also on atorvastatin for secondary prevention of stroke.  He denies any myalgias or right upper quadrant pain.  He is taking  hydrochlorothiazide, losartan, and metoprolol for hypertension.  His blood pressure here today is slightly elevated however he has been extremely busy this morning.  He states that he checks it frequently at home and his systolic blood pressures typically between 118 and 128.  He seldom if ever sees elevated blood pressures.  He denies any dizziness upon standing.  He denies any weakness or lightheadedness.  He is overdue for prostate cancer screening.  Otherwise he is doing well with no concerns.  Past Medical History:  Diagnosis Date   Anxiety    Diarrhea    Hypertension    OSA (obstructive sleep apnea)    cpap 9 cm H2O   Stroke [facility])    Past Surgical History:  Procedure Laterality Date   BIOPSY  06/21/2017   Procedure: BIOPSY;  Surgeon: Hildred Laser  U, MD;  Location: [facility] ENDO SUITE;  Service: Endoscopy;;  rectal, righta and left colon;   COLONOSCOPY N/A 06/21/2017   Procedure: COLONOSCOPY;  Surgeon: Rogene Houston, MD;  Location: [facility] ENDO SUITE;  Service: Endoscopy;  Laterality: N/A;  1:25   INGUINAL HERNIA REPAIR Right 04/04/2018   Procedure: HERNIA REPAIR INGUINAL ADULT WITH MESH;  Surgeon: Aviva Signs, MD;  Location: [facility] ORS;  Service: General;  Laterality: Right;   INGUINAL HERNIA REPAIR Right 01/31/2019   Procedure: RECURRENT RIGHT INGUINAL HERNIORRHAPY WITH MESH, INCARCERATED;  Surgeon: Aviva Signs, MD;  Location: [facility] ORS;  Service: General;  Laterality: Right;   KNEE SURGERY     tore mcl in high school (70's)   Current Outpatient Medications on File Prior to Visit  Medication Sig Dispense Refill   aspirin 325 MG tablet Take 1 tablet (325 mg total) by mouth daily. 30 tablet 0   atorvastatin (LIPITOR) 40 MG tablet Take 1 tablet (40 mg total) by mouth daily at 6 PM. 90 tablet 3   Cholecalciferol (VITAMIN D3) 2000 units TABS Take 2,000 Units by mouth daily.     diphenhydrAMINE (BENADRYL) 25 MG tablet Take 25 mg by mouth at bedtime as needed.      hydrochlorothiazide  (HYDRODIURIL) 25 MG tablet Take 1 tablet (25 mg total) by mouth daily. 90 tablet 3   ibuprofen (ADVIL,MOTRIN) 200 MG tablet Take 200 mg by mouth every 6 (six) hours as needed for fever.     losartan (COZAAR) 100 MG tablet Take 1 tablet (100 mg total) by mouth daily. 30 tablet 0   Mesalamine (DELZICOL) 400 MG CPDR DR capsule Take 2 capsules (800 mg total) by mouth 2 (two) times daily. 120 capsule 11   metoprolol succinate (TOPROL-XL) 25 MG 24 hr tablet Take 1 tablet (25 mg total) by mouth daily. 90 tablet 3   No current facility-administered medications on file prior to visit.    No Known Allergies Social History   Socioeconomic History   Marital status: Married    Spouse name: Not on file   Number of children: Not on file   Years of education: Not on file   Highest education level: Not on file  Occupational History   Not on file  Social Needs   Financial resource strain: Not on file   Food insecurity    Worry: Not on file    Inability: Not on file   Transportation needs    Medical: Not on file    Non-medical: Not on file  Tobacco Use   Smoking status: Never Smoker   Smokeless tobacco: Never Used  Substance and Sexual Activity   Alcohol use: No    Alcohol/week: 0.0 standard drinks   Drug use: No   Sexual activity: Yes  Lifestyle   Physical activity    Days per week: Not on file    Minutes per session: Not on file   Stress: Not on file  Relationships   Social connections    Talks on phone: Not on file    Gets together: Not on file    Attends religious service: Not on file    Active member of club or organization: Not on file    Attends meetings of clubs or organizations: Not on file    Relationship status: Not on file   Intimate partner violence    Fear of current or ex partner: Not on file    Emotionally abused: Not on file    Physically abused: Not  on file    Forced sexual activity: Not on file  Other Topics Concern   Not on file  Social  History Narrative   Not on file   Family History  Problem Relation Age of Onset   Tuberculosis Mother    Stroke Mother    Heart disease Father        died at 51   Bradycardia Brother        pacemaker   Stroke Brother    Cancer Paternal Uncle    Mental illness Paternal Grandfather        suicide at 64   Colon cancer Neg Hx       Review of Systems  All other systems reviewed and are negative.      Objective:   Physical Exam  Constitutional: He is oriented to person, place, and time. He appears well-developed and well-nourished. No distress.  HENT:  Head: Normocephalic and atraumatic.  Right Ear: External ear normal.  Left Ear: External ear normal.  Nose: Nose normal.  Mouth/Throat: Oropharynx is clear and moist. No oropharyngeal exudate.  Eyes: Pupils are equal, round, and reactive to light. Conjunctivae and EOM are normal. Right eye exhibits no discharge. Left eye exhibits no discharge. No scleral icterus.  Neck: Normal range of motion. Neck supple. No JVD present. No tracheal deviation present. No thyromegaly present.  Cardiovascular: Normal rate, regular rhythm, normal heart sounds and intact distal pulses. Exam reveals no gallop and no friction rub.  No murmur heard. Pulmonary/Chest: Effort normal and breath sounds normal. No stridor. No respiratory distress. He has no wheezes. He has no rales. He exhibits no tenderness.  Abdominal: Soft. Bowel sounds are normal. He exhibits no distension and no mass. There is no abdominal tenderness. There is no rebound and no guarding.  Musculoskeletal: Normal range of motion.        General: No tenderness, deformity or edema.  Lymphadenopathy:    He has no cervical adenopathy.  Neurological: He is alert and oriented to person, place, and time. He has normal reflexes. No cranial nerve deficit. He exhibits normal muscle tone. Coordination normal.  Skin: Skin is warm. No rash noted. He is not diaphoretic. No erythema. No  pallor.  Psychiatric: He has a normal mood and affect. His behavior is normal. Judgment and thought content normal.  Vitals reviewed.         Assessment & Plan:  1. Benign essential HTN Blood pressure today is mildly elevated however his blood pressures at home have been well controlled.  We will make no changes in his antihypertensive medication at this time but I will check a CMP to monitor his renal function and potassium on the combination that he is taking - CBC with Differential/Platelet - COMPLETE METABOLIC PANEL WITH GFR - Lipid panel  2. Cerebrovascular accident (CVA) due to occlusion of small artery ([place]) Currently on aspirin for secondary prevention of stroke.  Check fasting lipid panel.  Goal LDL cholesterol is less than 70 - COMPLETE METABOLIC PANEL WITH GFR - Lipid panel  3. Left sided lacunar stroke ([place]) Please see #2 above - COMPLETE METABOLIC PANEL WITH GFR - Lipid panel  4. Pure hypercholesterolemia Check fasting lipid panel.  Goal LDL cholesterol is less than 70 - Lipid panel  5. Prostate cancer screening Check PSA - PSA

## 2019-05-21 ENCOUNTER — Encounter: Payer: Self-pay | Admitting: Family Medicine

## 2019-05-21 LAB — PSA: PSA: 0.6 ng/mL (ref <=4.0)

## 2019-05-21 LAB — LIPID PANEL
Cholesterol: 123 mg/dL (ref <200)
HDL: 46 mg/dL (ref >=40)
LDL Cholesterol (Calc): 61 mg/dL (calc)
Non-HDL Cholesterol (Calc): 77 mg/dL (calc) (ref <130)
Total CHOL/HDL Ratio: 2.7 (calc) (ref <5.0)
Triglycerides: 82 mg/dL (ref <150)

## 2019-05-21 LAB — COMPLETE METABOLIC PANEL WITH GFR
AG Ratio: 2.1 (calc) (ref 1.0–2.5)
ALT: 27 U/L (ref 9–46)
AST: 25 U/L (ref 10–35)
Albumin: 4.2 g/dL (ref 3.6–5.1)
Alkaline phosphatase (APISO): 76 U/L (ref 35–144)
BUN: 20 mg/dL (ref 7–25)
CO2: 29 mmol/L (ref 20–32)
Calcium: 10 mg/dL (ref 8.6–10.3)
Chloride: 103 mmol/L (ref 98–110)
Creat: 0.92 mg/dL (ref 0.70–1.25)
GFR, Est African American: 102 mL/min/{1.73_m2} (ref >=60)
GFR, Est Non African American: 88 mL/min/{1.73_m2} (ref >=60)
Globulin: 2 g/dL (calc) (ref 1.9–3.7)
Glucose, Bld: 94 mg/dL (ref 65–99)
Potassium: 4.4 mmol/L (ref 3.5–5.3)
Sodium: 140 mmol/L (ref 135–146)
Total Bilirubin: 0.8 mg/dL (ref 0.2–1.2)
Total Protein: 6.2 g/dL (ref 6.1–8.1)

## 2019-05-21 LAB — CBC WITH DIFFERENTIAL/PLATELET
Absolute Monocytes: 859 cells/uL (ref 200–950)
Basophils Absolute: 28 cells/uL (ref 0–200)
Basophils Relative: 0.4 %
Eosinophils Absolute: 220 cells/uL (ref 15–500)
Eosinophils Relative: 3.1 %
HCT: 46.1 % (ref 38.5–50.0)
Hemoglobin: 16.1 g/dL (ref 13.2–17.1)
Lymphs Abs: 1463 cells/uL (ref 850–3900)
MCH: 33 pg (ref 27.0–33.0)
MCHC: 34.9 g/dL (ref 32.0–36.0)
MCV: 94.5 fL (ref 80.0–100.0)
MPV: 9.4 fL (ref 7.5–12.5)
Monocytes Relative: 12.1 %
Neutro Abs: 4530 cells/uL (ref 1500–7800)
Neutrophils Relative %: 63.8 %
Platelets: 309 10*3/uL (ref 140–400)
RBC: 4.88 10*6/uL (ref 4.20–5.80)
RDW: 12 % (ref 11.0–15.0)
Total Lymphocyte: 20.6 %
WBC: 7.1 10*3/uL (ref 3.8–10.8)

## 2019-05-27 ENCOUNTER — Other Ambulatory Visit: Payer: Self-pay | Admitting: Family Medicine

## 2019-08-14 DIAGNOSIS — M25562 Pain in left knee: Secondary | ICD-10-CM | POA: Insufficient documentation

## 2019-08-29 ENCOUNTER — Other Ambulatory Visit: Payer: Self-pay | Admitting: Family Medicine

## 2019-09-24 ENCOUNTER — Other Ambulatory Visit: Payer: Self-pay

## 2019-09-24 ENCOUNTER — Ambulatory Visit (INDEPENDENT_AMBULATORY_CARE_PROVIDER_SITE_OTHER): Payer: BC Managed Care – PPO | Admitting: Internal Medicine

## 2019-09-24 ENCOUNTER — Encounter (INDEPENDENT_AMBULATORY_CARE_PROVIDER_SITE_OTHER): Payer: Self-pay | Admitting: Internal Medicine

## 2019-09-24 DIAGNOSIS — K5190 Ulcerative colitis, unspecified, without complications: Secondary | ICD-10-CM | POA: Insufficient documentation

## 2019-09-24 DIAGNOSIS — K5100 Ulcerative (chronic) pancolitis without complications: Secondary | ICD-10-CM | POA: Diagnosis not present

## 2019-09-24 HISTORY — DX: Ulcerative colitis, unspecified, without complications: K51.90

## 2019-09-24 MED ORDER — MESALAMINE 400 MG PO CPDR: 800.0000 mg | DELAYED_RELEASE_CAPSULE | Freq: Two times a day (BID) | ORAL | 3 refills | Status: DC

## 2019-09-24 NOTE — Patient Instructions (Signed)
Notify if you have rectal bleeding or diarrhea recurs

## 2019-09-24 NOTE — Progress Notes (Signed)
Presenting complaint;  Follow-up for ulcerative colitis.  Database and subjective:  Patient is 63 year old Caucasian male who was diagnosed with pan ulcerative colitis in August 2018 when he had colonoscopy for a few months history of diarrhea and 30 pound weight loss. He was treated with prednisone and begun on oral mesalamine.  He did not have relapse when he came off the prednisone.  He was last seen 1 year ago and was doing well. He has no complaints.  He generally has 1 formed stool daily.  He denies melena or rectal bleeding nausea or vomiting.  He states the weight loss that he suffered prior to the diagnosis has reversed.  His weight has not changed in the last 1 year.  He is not having any side effects with oral mesalamine. He takes ibuprofen occasionally.  He had to have surgery for incarcerated right inguinal hernia on 01/30/2019.  This was second surgery.  He says he is not have any problems since his surgery.  He is on full dose aspirin because of history of stroke.  Current Medications: Outpatient Encounter Medications as of 09/24/2019  Medication Sig  . aspirin 325 MG tablet Take 1 tablet (325 mg total) by mouth daily.  Marland Kitchen atorvastatin (LIPITOR) 40 MG tablet TAKE 1 TABLET BY MOUTH DAILY AT 6 PM (Patient taking differently: Take by mouth daily. )  . Cholecalciferol (VITAMIN D3) 2000 units TABS Take 2,000 Units by mouth daily.  . diphenhydrAMINE (BENADRYL) 25 MG tablet Take 25 mg by mouth at bedtime as needed.   . hydrochlorothiazide (HYDRODIURIL) 25 MG tablet TAKE ONE (1) TABLET BY MOUTH EVERY DAY  . ibuprofen (ADVIL,MOTRIN) 200 MG tablet Take 200 mg by mouth every 6 (six) hours as needed for fever.  Marland Kitchen losartan (COZAAR) 100 MG tablet TAKE ONE (1) TABLET BY MOUTH EVERY DAY  . Mesalamine (DELZICOL) 400 MG CPDR DR capsule Take 2 capsules (800 mg total) by mouth 2 (two) times daily. (Patient taking differently: Take 800 mg by mouth 2 (two) times daily. )  . metoprolol succinate  (TOPROL-XL) 25 MG 24 hr tablet TAKE ONE TABLET (25MG TOTAL) BY MOUTH DAILY   No facility-administered encounter medications on file as of 09/24/2019.      Objective: Blood pressure (!) 143/87, pulse (!) 59, temperature (!) 97.3 F (36.3 C), temperature source Oral, height 6' (1.829 m), weight 197 lb 11.2 oz (89.7 kg). Patient is alert and in no acute distress. He is wearing a mask. Conjunctiva is pink. Sclera is nonicteric Oropharyngeal mucosa is normal. No neck masses or thyromegaly noted. Cardiac exam with regular rhythm normal S1 and S2. No murmur or gallop noted. Lungs are clear to auscultation. Abdomen is symmetrical soft and nontender with organomegaly or masses. No LE edema or clubbing noted.  Labs/studies Results:  CBC Latest Ref Rng & Units 05/20/2019 01/30/2019 12/25/2018  WBC 3.8 - 10.8 Thousand/uL 7.1 15.8(H) 9.4  Hemoglobin 13.2 - 17.1 g/dL 16.1 19.1(H) 18.3(H)  Hematocrit 38.5 - 50.0 % 46.1 55.9(H) 53.9(H)  Platelets 140 - 400 Thousand/uL 309 353 368    CMP Latest Ref Rng & Units 05/20/2019 01/30/2019 12/25/2018  Glucose 65 - 99 mg/dL 94 176(H) 123(H)  BUN 7 - 25 mg/dL 20 27(H) 29(H)  Creatinine 0.70 - 1.25 mg/dL 0.92 1.15 1.10  Sodium 135 - 146 mmol/L 140 137 138  Potassium 3.5 - 5.3 mmol/L 4.4 4.0 4.1  Chloride 98 - 110 mmol/L 103 98 99  CO2 20 - 32 mmol/L 29 28 27  Calcium 8.6 - 10.3 mg/dL 10.0 10.2 10.4(H)  Total Protein 6.1 - 8.1 g/dL 6.2 7.7 8.1  Total Bilirubin 0.2 - 1.2 mg/dL 0.8 2.0(H) 2.2(H)  Alkaline Phos 38 - 126 U/L - 72 64  AST 10 - 35 U/L 25 27 29   ALT 9 - 46 U/L 27 30 30     Hepatic Function Latest Ref Rng & Units 05/20/2019 01/30/2019 12/25/2018  Total Protein 6.1 - 8.1 g/dL 6.2 7.7 8.1  Albumin 3.5 - 5.0 g/dL - 4.6 4.6  AST 10 - 35 U/L 25 27 29   ALT 9 - 46 U/L 27 30 30   Alk Phosphatase 38 - 126 U/L - 72 64  Total Bilirubin 0.2 - 1.2 mg/dL 0.8 2.0(H) 2.2(H)     Assessment:  #1.  Pan ulcerative colitis.  It was diagnosed in August 2018.  He is  on oral mesalamine and remains in remission.  His lab studies are unremarkable.  He is not having any side effects with oral mesalamine and I hope it stays this way.   Plan:  New prescription for Delzicol 800 mg by mouth twice daily sent to patient's pharmacy for 3 months with 3 refills. Patient will call if he experiences diarrhea or rectal bleeding. Next office visit in 1 year.

## 2019-09-26 ENCOUNTER — Ambulatory Visit: Payer: BC Managed Care – PPO | Admitting: Adult Health

## 2019-09-26 ENCOUNTER — Encounter: Payer: Self-pay | Admitting: Adult Health

## 2019-09-26 ENCOUNTER — Other Ambulatory Visit: Payer: Self-pay

## 2019-09-26 VITALS — BP 122/83 | HR 60 | Temp 97.8°F | Ht 73.0 in | Wt 199.4 lb

## 2019-09-26 DIAGNOSIS — G3184 Mild cognitive impairment, so stated: Secondary | ICD-10-CM | POA: Diagnosis not present

## 2019-09-26 DIAGNOSIS — Z8673 Personal history of transient ischemic attack (TIA), and cerebral infarction without residual deficits: Secondary | ICD-10-CM | POA: Diagnosis not present

## 2019-09-26 MED ORDER — ESCITALOPRAM OXALATE 10 MG PO TABS: 10.0000 mg | ORAL_TABLET | Freq: Every day | ORAL | 3 refills | Status: DC

## 2019-09-26 NOTE — Progress Notes (Deleted)
[facility]  PATIENT: Justin [name] DOB: 10-13-1956   REASON FOR VISIT: Follow-up for obstructive sleep apnea newly diagnosed with  CPAP compliance HISTORY FROM: Patient    HISTORY OF PRESENT ILLNESS:  Update 09/26/2019: Justin [name] is a 63 year old male who is being seen today for CPAP compliance for OSA management.   UPDATE 2/12/2020CM Justin [name], 63 year old male returns for follow-up with a history of obstructive sleep apnea here for CPAP compliance.  He has been doing well.  Much less daytime drowsiness.  CPAP data dated 11/18/2018-12/17/2018 shows usage  greater than 4 hours at 93%.  Average usage 5 hours 59 minutes.  Set pressure 9 cm leak 95th percentile 6.8.  AHI 0.7.  He returns for reevaluation   UPDATE 11/7/2019CM Justin [name], 63 year old male returns for follow-up for obstructive sleep apnea here for CPAP compliance.  He has history of stroke in June 2019.  He just had a stroke follow-up in the clinic last month.  He states he still getting used to his CPAP machine.  Wife says he no longer snores she can see improvements, he seems more rested when he uses his CPAP.  He wakes up and has taken the mask off.  CPAP compliance dated 08/13/2018-09/11/2018 shows compliance of 4 hours at 73% for 22 days less than 4 hours 7 days at 23% for combined days 29 out of 30.  Average usage 5 hours 8 minutes.  Set pressure 9 cm.  EPR level 3 leak 95th percentile 46.1 AHI 1.1 ESS 7 he returns for reevaluation 05/24/18 Justin [name] is a 63 y.o. male ,  Seen in STROKE follow up -Justin [name] is a 63 year old male patient whom I had initially seen earlier this year for evaluation of sleep apnea unfortunately the patient underwent a right-sided inguinal hernia repair for which she had to discontinue his full size aspirin 5 days prior.  Also aspirin was removed within 2 or 3 days post surgery he did suffer a stroke.  And his main symptom was right-sided numbness and clumsiness sensory loss  of the right dominant hand.  I see him today after he has undergone a stroke work-up in the hospital which included a head CT without contrast on 7 June, an MRI MRA of head and neck, the findings were an acute lacunar infarct in the left lateral thalamus there was no hemorrhage, he had negative abnormalities he was negative for abnormalities in the vascular tree his neck MRA showed mild atherosclerosis at the origins of the left internal carotid artery and right subclavian artery but not significant stenosis and he had very mild for age nonspecific white matter signal changes these are commonly seen the small vessel disease there was no brain atrophy noted.  He had presented to the history good to see you in which her birthdate is the only thing I do not like this is cold weather but other than that I am doing good rehab that lasted route had cantaloupes planted and had a 15 there were all about that size all he needed was some brain because I have tried to water in the city water late on and sure they will die because they could not get any water and always irrigated and overstated and verbalized hospital ED with elevated blood pressures which may explain the lacunar appearance of a previous stroke.  Head CT was negative.  Onset of symptoms was on 13 April 2018 he was evaluated by Dr. Kerney Elbe neurologist on-call,  who also mentioned that the patient has a history of obstructive sleep apnea and is currently treated at 9 cmH2O pressure.  The patient restarted on aspirin metabolic panels were normal his creatinine was 1.05 his BUN 24 which may indicate that he was slightly dehydrated, his CBC was normal but that the count was 9.6K hemoglobin 15.1 hematocrit 50.1 without any evidence of anemia normal platelet count 350 3K.  Cardiac enzymes were negative his total cholesterol was actually rather low at 133 ng but is good "" cholesterol HDL was only 30.  He finished OT and PT- he is awaiting surgery for TKR on the  left knee. Dr Amedeo Plenty follows. I would like for him to have 2-3 month of daily exercise 30 minutes, walking or biking, and some weight training. Control BP and cholesterol. Diet adjustment.  More fruit and vegetables.   UPDATE 5/7/2019CM Justin [name], 63 year old male returns for follow-up with newly diagnosed obstructive sleep apnea here for initial CPAP.  He has been using his machine for approximately 2 months.  He claims he is still getting used to it.  He recently changed his mask.  He sometimes wakes up with his mask off.  Compliance data dated 02/10/2018-03/11/2018 shows compliance greater than 4 hours at 70%.  Average usage 4 hours 53 minutes.  Pressure set at 9 cm EPR level 3 AHI 5.  Minimal leak ESS 5.  He returns for reevaluation  1/24/19CDKeith Justin [name] is a 63 y.o. male , seen here as in a referral from Dr. Dennard Schaumann for a sleep consult.   I have the pleasure of meeting Mr. and Mrs. Teed today, [name] is referred for evaluation of excessive daytime sleepiness, witnessed apneas, and some confusion when aroused from sleep. [name] has no significant medical history, his only diagnosis is a recent hernia and at one time he was evaluated by cardiology with a cardiac echo that returned normal.  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 in the day may be followed by several hours in the office. [name] has developed over the last years a habit of falling asleep in the den watching TV.  He may sleep there already 2 hours in a recliner before he will go over to the bedroom.  The couple at bedtime is now around 1130.  The bedroom is cool quiet and dark, the patient sleeps on a flat mattress, he sleeps on one pillow for head support one pillow between his knees and rest on his side. He will turned to supine sleep soon after he is  asleep.  When he snores loudest, and that is when apneas have been witnessed. He will have always 1 sometimes 2 or 3 bathroom breaks depending on fluid intake, his first bathroom break is usually around 3:00 AM. He does not recall vivid or lucid dreams and there is no dream enactment.  He wakes up spontaneously not depending on work requirements, he seems to wake up spontaneously between 3 and 5 AM and often has difficulties to go back to sleep.  In that case he usually goes back to his recliner.  He estimates that he gets only 4-5 hours of nocturnal sleep and some nights less. He has not fallen asleep at job sites, but once he is no longer physically active or mentally stimulated he struggles to stay  awake. On a week end he may take a nap- 20 minute power    REVIEW OF SYSTEMS: Full 14 system review of systems performed and notable only for those listed, all others are neg:  Constitutional: neg  Cardiovascular: neg Ear/Nose/Throat: neg  Skin: neg Eyes: neg Respiratory: neg Gastroitestinal: neg  Hematology/Lymphatic: neg  Endocrine: neg Musculoskeletal: Joint pain  Allergy/Immunology: neg Neurological: neg Psychiatric: neg Sleep : Obstructive sleep apnea with CPAP   ALLERGIES: No Known Allergies  HOME MEDICATIONS: Outpatient Medications Prior to Visit  Medication Sig Dispense Refill  . aspirin 325 MG tablet Take 1 tablet (325 mg total) by mouth daily. 30 tablet 0  . atorvastatin (LIPITOR) 40 MG tablet TAKE 1 TABLET BY MOUTH DAILY AT 6 PM (Patient taking differently: Take by mouth daily. ) 90 tablet 3  . Cholecalciferol (VITAMIN D3) 2000 units TABS Take 2,000 Units by mouth daily.    . diphenhydrAMINE (BENADRYL) 25 MG tablet Take 25 mg by mouth at bedtime as needed.     . hydrochlorothiazide (HYDRODIURIL) 25 MG tablet TAKE ONE (1) TABLET BY MOUTH EVERY DAY 90 tablet 3  . ibuprofen (ADVIL,MOTRIN) 200 MG tablet Take 200 mg by mouth every 6 (six) hours as needed for fever.    Marland Kitchen  losartan (COZAAR) 100 MG tablet TAKE ONE (1) TABLET BY MOUTH EVERY DAY 90 tablet 3  . Mesalamine (DELZICOL) 400 MG CPDR DR capsule Take 2 capsules (800 mg total) by mouth 2 (two) times daily. 360 capsule 3  . metoprolol succinate (TOPROL-XL) 25 MG 24 hr tablet TAKE ONE TABLET (25MG TOTAL) BY MOUTH DAILY 90 tablet 3   No facility-administered medications prior to visit.     PAST MEDICAL HISTORY: Past Medical History:  Diagnosis Date  . Anxiety   . Diarrhea   . Hypertension   . OSA (obstructive sleep apnea)    cpap 9 cm H2O  . Stroke [facility])     PAST SURGICAL HISTORY: Past Surgical History:  Procedure Laterality Date  . BIOPSY  06/21/2017   Procedure: BIOPSY;  Surgeon: Rogene Houston, MD;  Location: [facility];  Service: Endoscopy;;  rectal, righta and left colon;  . COLONOSCOPY N/A 06/21/2017   Procedure: COLONOSCOPY;  Surgeon: Rogene Houston, MD;  Location: [facility];  Service: Endoscopy;  Laterality: N/A;  1:25  . INGUINAL HERNIA REPAIR Right 04/04/2018   Procedure: HERNIA REPAIR INGUINAL ADULT WITH MESH;  Surgeon: Aviva Signs, MD;  Location: [facility];  Service: General;  Laterality: Right;  . INGUINAL HERNIA REPAIR Right 01/31/2019   Procedure: RECURRENT RIGHT INGUINAL HERNIORRHAPY WITH MESH, INCARCERATED;  Surgeon: Aviva Signs, MD;  Location: [facility];  Service: General;  Laterality: Right;  . KNEE SURGERY     tore mcl in high school (70's)    FAMILY HISTORY: Family History  Problem Relation Age of Onset  . Tuberculosis Mother   . Stroke Mother   . Heart disease Father        died at 98  . Bradycardia Brother        pacemaker  . Stroke Brother   . Cancer Paternal Uncle   . Mental illness Paternal Grandfather        suicide at 74  . Colon cancer Neg Hx     SOCIAL HISTORY: Social History   Socioeconomic History  . Marital status: Married    Spouse name: Not on file  . Number of children: Not on file  . Years of education: Not on  file  . Highest education  level: Not on file  Occupational History  . Not on file  Social Needs  . Financial resource strain: Not on file  . Food insecurity    Worry: Not on file    Inability: Not on file  . Transportation needs    Medical: Not on file    Non-medical: Not on file  Tobacco Use  . Smoking status: Never Smoker  . Smokeless tobacco: Never Used  Substance and Sexual Activity  . Alcohol use: No    Alcohol/week: 0.0 standard drinks  . Drug use: No  . Sexual activity: Yes  Lifestyle  . Physical activity    Days per week: Not on file    Minutes per session: Not on file  . Stress: Not on file  Relationships  . Social Herbalist on phone: Not on file    Gets together: Not on file    Attends religious service: Not on file    Active member of club or organization: Not on file    Attends meetings of clubs or organizations: Not on file    Relationship status: Not on file  . Intimate partner violence    Fear of current or ex partner: Not on file    Emotionally abused: Not on file    Physically abused: Not on file    Forced sexual activity: Not on file  Other Topics Concern  . Not on file  Social History Narrative  . Not on file     PHYSICAL EXAM  There were no vitals filed for this visit. There is no height or weight on file to calculate BMI.  Generalized: Well developed, in no acute distress  Head: normocephalic and atraumatic,. Oropharynx benign mallopati 4 Neck: Supple, circumference 17.5 Lungs clear Musculoskeletal: No deformity  Skin without rash or edema  Neurological examination   Mentation: Alert oriented to time, place, history taking. Attention span and concentration appropriate. Recent and remote memory intact.  Follows all commands speech and language fluent.   Cranial nerve II-XII: .Pupils were equal round reactive to light extraocular movements were full, visual field were full on confrontational test. Facial sensation and strength were normal. hearing was  intact to finger rubbing bilaterally. Uvula tongue midline. head turning and shoulder shrug were normal and symmetric.Tongue protrusion into cheek strength was normal. Motor: normal bulk and tone, full strength in the BUE, BLE, Sensory: normal and symmetric to light touch,  Coordination: finger-nose-finger, heel-to-shin bilaterally, no dysmetria Gait and Station: Rising up from seated position without assistance, normal stance,  moderate stride, good arm swing, smooth turning, able to perform tiptoe, and heel walking without difficulty. Tandem gait is steady  DIAGNOSTIC DATA (LABS, IMAGING, TESTING) - I reviewed patient records, labs, notes, testing and imaging myself where available.  Lab Results  Component Value Date   WBC 7.1 05/20/2019   HGB 16.1 05/20/2019   HCT 46.1 05/20/2019   MCV 94.5 05/20/2019   PLT 309 05/20/2019      Component Value Date/Time   NA 140 05/20/2019 1012   K 4.4 05/20/2019 1012   CL 103 05/20/2019 1012   CO2 29 05/20/2019 1012   GLUCOSE 94 05/20/2019 1012   BUN 20 05/20/2019 1012   CREATININE 0.92 05/20/2019 1012   CALCIUM 10.0 05/20/2019 1012   PROT 6.2 05/20/2019 1012   ALBUMIN 4.6 01/30/2019 1042   AST 25 05/20/2019 1012   ALT 27 05/20/2019 1012   ALKPHOS 72 01/30/2019 1042  BILITOT 0.8 05/20/2019 1012   GFRNONAA 88 05/20/2019 1012   GFRAA 102 05/20/2019 1012   Lab Results  Component Value Date   CHOL 123 05/20/2019   HDL 46 05/20/2019   LDLCALC 61 05/20/2019   TRIG 82 05/20/2019   CHOLHDL 2.7 05/20/2019   Lab Results  Component Value Date   HGBA1C 5.2 04/14/2018   No results found for: VITAMINB12 Lab Results  Component Value Date   TSH 1.15 04/28/2017      ASSESSMENT AND PLAN  63 y.o. year old male  has a past medical history of Anxiety, Diarrhea, Hypertension, OSA (obstructive sleep apnea), and Stroke ([place]). here for  CPAP compliance.Data dated 11/18/2018-12/17/2018 shows usage  greater than 4 hours at 93%.  Average usage 5 hours  59 minutes.  Set pressure 9 cm leak 95th percentile 6.8.  AHI 0.7.   PLAN: CPAP compliance 93% reviewed compliance data with patient Continue same settings Follow-up in yearly  for CPAP compliance  Frann Rider, AGNP-BC  [facility] [address] [place], [place] [address]  Phone [phone] Fax [phone] Note: This document was prepared with digital dictation and possible smart phrase technology. Any transcriptional errors that result from this process are unintentional.

## 2019-09-26 NOTE — Patient Instructions (Addendum)
Your Plan:  Start Lexapro 23m diaily - after about 4 weeks call office if needed for increase; also call with any potential side effects  Ensure stress relation techniques as listed below  Do memory exercises such as cross word puzzles, word search, card games and reading  Obtain lab work vit b12, thyroid level, CBC and CMP   Follow up in 3 months or call earlier       [facility] you for [facility]. I hope we have been able to provide you high quality care today.  You may receive a patient satisfaction survey over the next few weeks. We would appreciate your feedback and comments so that we may continue to improve ourselves and the health of our patients.   Escitalopram tablets What is this medicine? ESCITALOPRAM (es sye TAL oh pram) is used to treat depression and certain types of anxiety. This medicine may be used for other purposes; ask your health care provider or pharmacist if you have questions. COMMON BRAND NAME(S): Lexapro What should I tell my health care provider before I take this medicine? They need to know if you have any of these conditions:  bipolar disorder or a family history of bipolar disorder  diabetes  glaucoma  heart disease  kidney or liver disease  receiving electroconvulsive therapy  seizures (convulsions)  suicidal thoughts, plans, or attempt by you or a family member  an unusual or allergic reaction to escitalopram, the related drug citalopram, other medicines, foods, dyes, or preservatives  pregnant or trying to become pregnant  breast-feeding How should I use this medicine? Take this medicine by mouth with a glass of water. Follow the directions on the prescription label. You can take it with or without food. If it upsets your stomach, take it with food. Take your medicine at regular intervals. Do not take it more often than directed. Do not stop taking this medicine suddenly except upon the advice of  your doctor. Stopping this medicine too quickly may cause serious side effects or your condition may worsen. A special MedGuide will be given to you by the pharmacist with each prescription and refill. Be sure to read this information carefully each time. Talk to your pediatrician regarding the use of this medicine in children. Special care may be needed. Overdosage: If you think you have taken too much of this medicine contact a poison control center or emergency room at once. NOTE: This medicine is only for you. Do not share this medicine with others. What if I miss a dose? If you miss a dose, take it as soon as you can. If it is almost time for your next dose, take only that dose. Do not take double or extra doses. What may interact with this medicine? Do not take this medicine with any of the following medications:  certain medicines for fungal infections like fluconazole, itraconazole, ketoconazole, posaconazole, voriconazole  cisapride  citalopram  dronedarone  linezolid  MAOIs like Carbex, Eldepryl, Marplan, Nardil, and Parnate  methylene blue (injected into a vein)  pimozide  thioridazine This medicine may also interact with the following medications:  alcohol  amphetamines  aspirin and aspirin-like medicines  carbamazepine  certain medicines for depression, anxiety, or psychotic disturbances  certain medicines for migraine headache like almotriptan, eletriptan, frovatriptan, naratriptan, rizatriptan, sumatriptan, zolmitriptan  certain medicines for sleep  certain medicines that treat or prevent blood clots like warfarin, enoxaparin, dalteparin  cimetidine  diuretics  dofetilide  fentanyl  furazolidone  isoniazid  lithium  metoprolol  NSAIDs, medicines for pain and inflammation, like ibuprofen or naproxen  other medicines that prolong the QT interval (cause an abnormal heart rhythm)  procarbazine  rasagiline  supplements like [employer]. John's  wort, kava kava, valerian  tramadol  tryptophan  ziprasidone This list may not describe all possible interactions. Give your health care provider a list of all the medicines, herbs, non-prescription drugs, or dietary supplements you use. Also tell them if you smoke, drink alcohol, or use illegal drugs. Some items may interact with your medicine. What should I watch for while using this medicine? Tell your doctor if your symptoms do not get better or if they get worse. Visit your doctor or health care professional for regular checks on your progress. Because it may take several weeks to see the full effects of this medicine, it is important to continue your treatment as prescribed by your doctor. Patients and their families should watch out for new or worsening thoughts of suicide or depression. Also watch out for sudden changes in feelings such as feeling anxious, agitated, panicky, irritable, hostile, aggressive, impulsive, severely restless, overly excited and hyperactive, or not being able to sleep. If this happens, especially at the beginning of treatment or after a change in dose, call your health care professional. Dennis Bast may get drowsy or dizzy. Do not drive, use machinery, or do anything that needs mental alertness until you know how this medicine affects you. Do not stand or sit up quickly, especially if you are an older patient. This reduces the risk of dizzy or fainting spells. Alcohol may interfere with the effect of this medicine. Avoid alcoholic drinks. Your mouth may get dry. Chewing sugarless gum or sucking hard candy, and drinking plenty of water may help. Contact your doctor if the problem does not go away or is severe. What side effects may I notice from receiving this medicine? Side effects that you should report to your doctor or health care professional as soon as possible:  allergic reactions like skin rash, itching or hives, swelling of the face, lips, or  tongue  anxious  black, tarry stools  changes in vision  confusion  elevated mood, decreased need for sleep, racing thoughts, impulsive behavior  eye pain  fast, irregular heartbeat  feeling faint or lightheaded, falls  feeling agitated, angry, or irritable  hallucination, loss of contact with reality  loss of balance or coordination  loss of memory  painful or prolonged erections  restlessness, pacing, inability to keep still  seizures  stiff muscles  suicidal thoughts or other mood changes  trouble sleeping  unusual bleeding or bruising  unusually weak or tired  vomiting Side effects that usually do not require medical attention (report to your doctor or health care professional if they continue or are bothersome):  changes in appetite  change in sex drive or performance  headache  increased sweating  indigestion, nausea  tremors This list may not describe all possible side effects. Call your doctor for medical advice about side effects. You may report side effects to FDA at 1-800-FDA-1088. Where should I keep my medicine? Keep out of reach of children. Store at room temperature between 15 and 30 degrees C (59 and 86 degrees F). Throw away any unused medicine after the expiration date. NOTE: This sheet is a summary. It may not cover all possible information. If you have questions about this medicine, talk to your doctor, pharmacist, or health care provider.  2020 Elsevier/Gold Standard (2018-10-15  11:21:44)    Stress Stress is a normal reaction to life events. Stress is what you feel when life demands more than you are used to, or more than you think you can handle. Some stress can be useful, such as studying for a test or meeting a deadline at work. Stress that occurs too often or for too long can cause problems. It can affect your emotional health and interfere with relationships and normal daily activities. Too much stress can weaken your body's  defense system (immune system) and increase your risk for physical illness. If you already have a medical problem, stress can make it worse. What are the causes? All sorts of life events can cause stress. An event that causes stress for one person may not be stressful for another person. Major life events, whether positive or negative, commonly cause stress. Examples include:  Losing a job or starting a new job.  Losing a loved one.  Moving to a new town or home.  Getting married or divorced.  Having a baby.  Injury or illness. Less obvious life events can also cause stress, especially if they occur day after day or in combination with each other. Examples include:  Working long hours.  Driving in traffic.  Caring for children.  Being in debt.  Being in a difficult relationship. What are the signs or symptoms? Stress can cause emotional symptoms, including:  Anxiety. This is feeling worried, afraid, on edge, overwhelmed, or out of control.  Anger, including irritation or impatience.  Depression. This is feeling sad, down, helpless, or guilty.  Trouble focusing, remembering, or making decisions. Stress can cause physical symptoms, including:  Aches and pains. These may affect your head, neck, back, stomach, or other areas of your body.  Tight muscles or a clenched jaw.  Low energy.  Trouble sleeping. Stress can cause unhealthy behaviors, including:  Eating to feel better (overeating) or skipping meals.  Working too much or putting off tasks.  Smoking, drinking alcohol, or using drugs to feel better. How is this diagnosed? Stress is diagnosed through an assessment by your health care provider. He or she may diagnose this condition based on:  Your symptoms and any stressful life events.  Your medical history.  Tests to rule out other causes of your symptoms. Depending on your condition, your health care provider may refer you to a specialist for further  evaluation. How is this treated?  Stress management techniques are the recommended treatment for stress. Medicine is not typically recommended for the treatment of stress. Techniques to reduce your reaction to stressful life events include:  Stress identification. Monitor yourself for symptoms of stress and identify what causes stress for you. These skills may help you to avoid or prepare for stressful events.  Time management. Set your priorities, keep a calendar of events, and learn to say "no." Taking these actions can help you avoid making too many commitments. Techniques for coping with stress include:  Rethinking the problem. Try to think realistically about stressful events rather than ignoring them or overreacting. Try to find the positives in a stressful situation rather than focusing on the negatives.  Exercise. Physical exercise can release both physical and emotional tension. The key is to find a form of exercise that you enjoy and do it regularly.  Relaxation techniques. These relax the body and mind. The key is to find one or more that you enjoy and use the technique(s) regularly. Examples include: ? Meditation, deep breathing, or progressive relaxation techniques. ?  Yoga or tai chi. ? Biofeedback, mindfulness techniques, or journaling. ? Listening to music, being out in nature, or participating in other hobbies.  Practicing a healthy lifestyle. Eat a balanced diet, drink plenty of water, limit or avoid caffeine, and get plenty of sleep.  Having a strong support network. Spend time with family, friends, or other people you enjoy being around. Express your feelings and talk things over with someone you trust. Counseling or talk therapy with a mental health professional may be helpful if you are having trouble managing stress on your own. Follow these instructions at home: Lifestyle   Avoid drugs.  Do not use any products that contain nicotine or tobacco, such as cigarettes  and e-cigarettes. If you need help quitting, ask your health care provider.  Limit alcohol intake to no more than 1 drink a day for nonpregnant women and 2 drinks a day for men. One drink equals 12 oz of beer, 5 oz of wine, or 1 oz of hard liquor.  Do not use alcohol or drugs to relax.  Eat a balanced diet that includes fresh fruits and vegetables, whole grains, lean meats, fish, eggs, and beans, and low-fat dairy. Avoid processed foods and foods high in added fat, sugar, and salt.  Exercise at least 30 minutes on 5 or more days each week.  Get 7-8 hours of sleep each night. General instructions   Practice stress management techniques as discussed with your health care provider.  Drink enough fluid to keep your urine clear or pale yellow.  Take over-the-counter and prescription medicines only as told by your health care provider.  Keep all follow-up visits as told by your health care provider. This is important. Contact a health care provider if:  Your symptoms get worse.  You have new symptoms.  You feel overwhelmed by your problems and can no longer manage them on your own. Get help right away if:  You have thoughts of hurting yourself or others. If you ever feel like you may hurt yourself or others, or have thoughts about taking your own life, get help right away. You can go to your nearest emergency department or call:  Your local emergency services (911 in the [place]  A suicide crisis helpline, such as the [place] at (276) 004-5346. This is open 24 hours a day. Summary  Stress is a normal reaction to life events. It can cause problems if it happens too often or for too long.  Practicing stress management techniques is the best way to treat stress.  Counseling or talk therapy with a mental health professional may be helpful if you are having trouble managing stress on your own. This information is not intended to replace advice given to you  by your health care provider. Make sure you discuss any questions you have with your health care provider. Document Released: 04/19/2001 Document Revised: 10/06/2017 Document Reviewed: 12/14/2016 Elsevier Patient Education  2020 [employer].

## 2019-09-26 NOTE — Progress Notes (Signed)
Provider:  [name], [name]  Primary Care Physician:  [name], MD  Reason for visit: Stroke follow-up   Chief Complaint  Patient presents with   Stroke/OSA on CPAP    rm 9, wife- Justin [name] "wife req FU due to short term memory loss worse than it was"  MMSE 23    HPI:   Update 09/26/2019: Justin [name] is a 63 year old male who is being seen per patient/wife request due to worsening short-term memory loss.  He was previously being followed in this office due to left thalamic stroke in 04/2018 with residual right hemisensory impairment.  He has continued on aspirin and atorvastatin for secondary stroke prevention.  Recent lipid panel showed LDL 61.  Wife has been observing occasional short-term memory concerns over the past few months.  He has continued working and functioning without difficulty but he will occasionally forget certain conversations, triple check to ensure he is completed different tasks and forgetting certain dates.  He does endorse increased stress over the past few months at work where he has no disease last patient with his workers and increased irritability.  He is also been experiencing worsening insomnia and has been having greater difficulty tolerating CPAP mask.  He does have a prior history of anxiety.  He was previously treated on Xanax several years prior. MMSE 23/30 greatest in recall, repetition, and drawing.  GAD-7 score 4.  PHQ 9 score 5.  After further discussing possible depression/anxiety causing concerns of memory, wife has noticed patient being more depressed recently.  Reviewing his medication list, he will take Benadryl occasionally as needed to help with sleep.     Interval history 08/23/2018: Patient returns today for stroke follow-up visit.  He did undergo 30-day cardiac monitor which did show episode of V. tach with heart rate 178 but negative for atrial fibrillation.  Upon initial evaluation in the ED, he reported numbness of the right face,  right upper extremity and right lower extremity.  Patient does state that he continues to have right upper extremity numbness that is constant with a "stiff and tight feeling".  He has continued to work despite his continued numbness but does have intermittent difficulties as he is right-hand dominant.  He also does have some memory concerns stating that he is not as "sharp" as he previously was and will ask the same questions or will forget certain directions.  He continues to take aspirin without side effects of bleeding or bruising.  Continues to take Lipitor without side effects myalgias.  Blood pressure today satisfactory 135/89.  He does continue to state compliance with CPAP but has been finding himself taking the mask off in the middle the night.  He does have a follow-up in this office for OSA management on 09/13/2018.  No further concerns at this time.  Denies new or worsening stroke/TIA symptoms.  05/24/2018 visit CD: Seen in STROKE follow up -[name] is a 63 year old male patient whom I had initially seen earlier this year for evaluation of sleep apnea unfortunately the patient underwent a right-sided inguinal hernia repair for which she had to discontinue his full size aspirin 5 days prior.  Also aspirin was removed within 2 or 3 days post surgery he did suffer a stroke.  And his main symptom was right-sided numbness and clumsiness sensory loss of the right dominant hand.  I see him today after he has undergone a stroke work-up in the hospital which included a head CT  without contrast on 7 June, an MRI MRA of head and neck, the findings were an acute lacunar infarct in the left lateral thalamus there was no hemorrhage, he had negative abnormalities he was negative for abnormalities in the vascular tree his neck MRA showed mild atherosclerosis at the origins of the left internal carotid artery and right subclavian artery but not significant stenosis and he had very mild for age nonspecific white  matter signal changes these are commonly seen the small vessel disease there was no brain atrophy noted.  He had presented to the hospital ED with elevated blood pressures which may explain the lacunar appearance of a previous stroke.  Head CT was negative.  Onset of symptoms was on 13 April 2018 he was evaluated by Dr. Kerney Elbe neurologist on-call, who also mentioned that the patient has a history of obstructive sleep apnea and is currently treated at 9 cmH2O pressure.  The patient restarted on aspirin metabolic panels were normal his creatinine was 1.05 his BUN 24 which may indicate that he was slightly dehydrated, his CBC was normal but that the count was 9.6K hemoglobin 15.1 hematocrit 50.1 without any evidence of anemia normal platelet count 350 3K.  Cardiac enzymes were negative his total cholesterol was actually rather low at 133 ng but is good "" cholesterol HDL was only 30.  He finished OT and PT- he is awaiting surgery for TKR on the left knee. Dr Amedeo Plenty follows. I would like for him to have 2-3 month of daily exercise 30 minutes, walking or biking, and some weight training. Control BP and cholesterol. Diet adjustment.  More fruit and vegetables.     Review of Systems: Out of a complete 14 system review, the patient complains of only the following symptoms, and all other reviewed systems are negative. Memory loss, anxiety, depression    Social History   Socioeconomic History   Marital status: Married    Spouse name: Justin [name]   Number of children: Not on file   Years of education: Not on file   Highest education level: Not on file  Occupational History   Not on file  Social Needs   Financial resource strain: Not on file   Food insecurity    Worry: Not on file    Inability: Not on file   Transportation needs    Medical: Not on file    Non-medical: Not on file  Tobacco Use   Smoking status: Never Smoker   Smokeless tobacco: Never Used  Substance and Sexual Activity     Alcohol use: No    Alcohol/week: 0.0 standard drinks   Drug use: No   Sexual activity: Yes  Lifestyle   Physical activity    Days per week: Not on file    Minutes per session: Not on file   Stress: Not on file  Relationships   Social connections    Talks on phone: Not on file    Gets together: Not on file    Attends religious service: Not on file    Active member of club or organization: Not on file    Attends meetings of clubs or organizations: Not on file    Relationship status: Not on file   Intimate partner violence    Fear of current or ex partner: Not on file    Emotionally abused: Not on file    Physically abused: Not on file    Forced sexual activity: Not on file  Other Topics Concern   Not  on file  Social History Narrative   Lives with wife    Family History  Problem Relation Age of Onset   Tuberculosis Mother    Stroke Mother    Heart disease Father        died at 56   Bradycardia Brother        pacemaker   Stroke Brother    Cancer Paternal Uncle    Mental illness Paternal Grandfather        suicide at 62   Colon cancer Neg Hx     Past Medical History:  Diagnosis Date   Anxiety    Diarrhea    Hypertension    OSA (obstructive sleep apnea)    cpap 9 cm H2O   Stroke [facility])     Past Surgical History:  Procedure Laterality Date   BIOPSY  06/21/2017   Procedure: BIOPSY;  Surgeon: Rogene Houston, MD;  [facility];  Service: Endoscopy;;  rectal, righta and left colon;   COLONOSCOPY N/A 06/21/2017   Procedure: COLONOSCOPY;  Surgeon: Rogene Houston, MD;  Location: [facility];  Service: Endoscopy;  Laterality: N/A;  1:25   INGUINAL HERNIA REPAIR Right 04/04/2018   Procedure: HERNIA REPAIR INGUINAL ADULT WITH MESH;  Surgeon: Aviva Signs, MD;  Location: [facility] ORS;  Service: General;  Laterality: Right;   INGUINAL HERNIA REPAIR Right 01/31/2019   Procedure: RECURRENT RIGHT INGUINAL HERNIORRHAPY WITH MESH, INCARCERATED;   Surgeon: Aviva Signs, MD;  Location: [facility] OR[facility];  Service: General;  Laterality: Right;   KNEE SURGERY     tore mcl in high school (70's)    Current Outpatient Medications  Medication Sig Dispense Refill   aspirin 325 MG tablet Take 1 tablet (325 mg total) by mouth daily. 30 tablet 0   atorvastatin (LIPITOR) 40 MG tablet TAKE 1 TABLET BY MOUTH DAILY AT 6 PM (Patient taking differently: Take by mouth daily. ) 90 tablet 3   Cholecalciferol (VITAMIN D3) 2000 units TABS Take 2,000 Units by mouth daily.     diphenhydrAMINE (BENADRYL) 25 MG tablet Take 25 mg by mouth at bedtime as needed.      hydrochlorothiazide (HYDRODIURIL) 25 MG tablet TAKE ONE (1) TABLET BY MOUTH EVERY DAY 90 tablet 3   losartan (COZAAR) 100 MG tablet TAKE ONE (1) TABLET BY MOUTH EVERY DAY 90 tablet 3   Mesalamine (DELZICOL) 400 MG CPDR DR capsule Take 2 capsules (800 mg total) by mouth 2 (two) times daily. 360 capsule 3   metoprolol succinate (TOPROL-XL) 25 MG 24 hr tablet TAKE ONE TABLET (25MG TOTAL) BY MOUTH DAILY 90 tablet 3   escitalopram (LEXAPRO) 10 MG tablet Take 1 tablet (10 mg total) by mouth daily. 30 tablet 3   No current facility-administered medications for this visit.     Allergies as of 09/26/2019   (No Known Allergies)    Vitals: Today's Vitals   09/26/19 1028  BP: 122/83  Pulse: 60  Temp: 97.8 F (36.6 C)  Weight: 199 lb 6.4 oz (90.4 kg)  Height: 6' 1"  (1.854 m)   Body mass index is 26.31 kg/m.  Depression screen [facility] 2/9 09/26/2019 05/28/2018  Decreased Interest 1 0  Down, Depressed, Hopeless 1 0  PHQ - 2 Score 2 0  Altered sleeping 0 -  Tired, decreased energy 1 -  Change in appetite 0 -  Feeling bad or failure about yourself  1 -  Trouble concentrating 1 -  Moving slowly or fidgety/restless 0 -  Suicidal  thoughts 0 -  PHQ-9 Score 5 -   GAD 7 : Generalized Anxiety Score 09/26/2019  Nervous, Anxious, on Edge 1  Control/stop worrying 1  Worry too much - different things 1   Trouble relaxing 0  Restless 0  Easily annoyed or irritable 1  Afraid - awful might happen 0  Total GAD 7 Score 4  Anxiety Difficulty Somewhat difficult     Physical exam:  General: well developed, well nourished, pleasant middle-age Caucasian male, seated, in no evident distress Head: head normocephalic and atraumatic.   Neck: supple with no carotid or supraclavicular bruits Cardiovascular: regular rate and rhythm, no murmurs Musculoskeletal: no deformity Skin:  no rash/petichiae Vascular:  Normal pulses all extremities   Neurologic Exam Mental Status: Awake and fully alert. Oriented to place and time. Recent memory impaired and remote memory intact. Attention span, concentration and fund of knowledge appropriate during visit. Mood and affect appropriate.  MMSE - Mini Mental State Exam 09/26/2019  Orientation to time 4  Orientation to Place 5  Registration 3  Attention/ Calculation 3  Recall 1  Language- name 2 objects 2  Language- repeat 0  Language- follow 3 step command 3  Language- read & follow direction 1  Write a sentence 1  Copy design 0  Total score 23   Cranial Nerves:  Pupils equal, briskly reactive to light. Extraocular movements full without nystagmus. Visual fields full to confrontation. Hearing intact. Facial sensation intact. Face, tongue, palate moves normally and symmetrically.  Motor: Normal bulk and tone. Normal strength in all tested extremity muscles. Sensory.: intact to touch , pinprick , position and vibratory sensation.  Coordination: Rapid alternating movements normal in all extremities. Finger-to-nose and heel-to-shin performed accurately bilaterally. Gait and Station: Arises from chair without difficulty. Stance is normal. Gait demonstrates normal stride length and balance Reflexes: 1+ and symmetric. Toes downgoing.      Assessment: Justin [name] is a 63 year old male who is being seen today per patient/wife request due to concerns of 48-month onset of short-term memory loss.  History of left thalamic infarct in 04/14/2018 secondary to small vessel disease, HTN, HLD and OSA on CPAP.  MMSE 23/30.  PHQ 9 score 5.  GAD-7 score 4.    Plan:   -Short-term concerns potentially age-related versus increased stress with depression/anxiety related.  Recommend initiating Lexapro 10 mg daily -Discussion regarding stress relaxation techniques -Advised to do memory exercises such as crossword puzzles, word search, card games and reading -Obtain labs: Vitamin B12, TSH, CBC and CMP -Advised wife to call office with any worsening or if no improvement of short-term memory loss, may consider additional studies in the future -Continue aspirin 325 mg daily  and atorvastatin 40 mg for secondary stroke prevention -F/u with PCP regarding your HLD and HTN management -Continue compliance with CPAP for OSA management along with following with Dr. DBrett Fairy-continue to monitor BP at home -Advised to continue to stay active and maintain a healthy diet -Maintain strict control of hypertension with blood pressure goal below 130/90, diabetes with hemoglobin A1c goal below 6.5% and cholesterol with LDL cholesterol (bad cholesterol) goal below 70 mg/dL. I also advised the patient to eat a healthy diet with plenty of whole grains, cereals, fruits and vegetables, exercise regularly and maintain ideal body weight.  Follow-up in 3 months or call earlier if needed  JFrann Rider AW[facility] [address]SThermalGLehigh Ong [address] Phone [phone] [phone]: This document was prepared  with digital dictation and possible smart phrase technology. Any transcriptional errors that result from this process are unintentional.

## 2019-09-27 LAB — COMPREHENSIVE METABOLIC PANEL
ALT: 30 IU/L (ref 0–44)
AST: 28 IU/L (ref 0–40)
Albumin/Globulin Ratio: 1.6 (ref 1.2–2.2)
Albumin: 4.4 g/dL (ref 3.8–4.8)
Alkaline Phosphatase: 97 IU/L (ref 39–117)
BUN/Creatinine Ratio: 18 (ref 10–24)
BUN: 19 mg/dL (ref 8–27)
Bilirubin Total: 0.8 mg/dL (ref 0.0–1.2)
CO2: 26 mmol/L (ref 20–29)
Calcium: 10.5 mg/dL — ABNORMAL HIGH (ref 8.6–10.2)
Chloride: 99 mmol/L (ref 96–106)
Creatinine, Ser: 1.04 mg/dL (ref 0.76–1.27)
GFR calc Af Amer: 87 mL/min/{1.73_m2} (ref >59)
GFR calc non Af Amer: 76 mL/min/{1.73_m2} (ref >59)
Globulin, Total: 2.7 g/dL (ref 1.5–4.5)
Glucose: 91 mg/dL (ref 65–99)
Potassium: 4.8 mmol/L (ref 3.5–5.2)
Sodium: 139 mmol/L (ref 134–144)
Total Protein: 7.1 g/dL (ref 6.0–8.5)

## 2019-09-27 LAB — VITAMIN B12: Vitamin B-12: 563 pg/mL (ref 232–1245)

## 2019-09-27 LAB — CBC
Hematocrit: 48.7 % (ref 37.5–51.0)
Hemoglobin: 16.8 g/dL (ref 13.0–17.7)
MCH: 32.9 pg (ref 26.6–33.0)
MCHC: 34.5 g/dL (ref 31.5–35.7)
MCV: 95 fL (ref 79–97)
Platelets: 314 10*3/uL (ref 150–450)
RBC: 5.11 x10E6/uL (ref 4.14–5.80)
RDW: 12 % (ref 11.6–15.4)
WBC: 7.5 10*3/uL (ref 3.4–10.8)

## 2019-09-27 LAB — TSH: TSH: 1.29 u[IU]/mL (ref 0.450–4.500)

## 2019-09-27 NOTE — Progress Notes (Signed)
I agree with the above plan 

## 2019-09-30 ENCOUNTER — Telehealth: Payer: Self-pay | Admitting: *Deleted

## 2019-09-30 NOTE — Telephone Encounter (Signed)
Spoke with wife, [name] on [place] and informed her that his recent lab work is largely unremarkable with slight elevation in calcium but appears as though this may be chronic. I advised her the NP stated  to continue to follow with PCP. [name] stated he takes calcium daily, dose unknown. I advised he cut back on calcium, maybe take every other day and get PCP to check level again. She  verbalized understanding, appreciation.

## 2019-10-24 ENCOUNTER — Ambulatory Visit: Payer: BC Managed Care – PPO | Admitting: Family Medicine

## 2019-10-28 ENCOUNTER — Ambulatory Visit (INDEPENDENT_AMBULATORY_CARE_PROVIDER_SITE_OTHER): Payer: BC Managed Care – PPO | Admitting: Family Medicine

## 2019-10-28 ENCOUNTER — Other Ambulatory Visit: Payer: Self-pay | Admitting: Family Medicine

## 2019-10-28 ENCOUNTER — Other Ambulatory Visit: Payer: Self-pay

## 2019-10-28 DIAGNOSIS — L03115 Cellulitis of right lower limb: Secondary | ICD-10-CM

## 2019-10-28 MED ORDER — CEPHALEXIN 500 MG PO CAPS: 500.0000 mg | ORAL_CAPSULE | Freq: Four times a day (QID) | ORAL | 0 refills | Status: DC

## 2019-10-28 NOTE — Progress Notes (Signed)
Subjective:    Patient ID: Justin [name], male    DOB: 10-May-1956, 63 y.o.   MRN: [id number]  HPI  Patient is being seen today as a telephone call.  Phone call began at 830.  Phone call concluded at 840.  Patient developed a fever on Wednesday.  Fever has been as high as 102.  Friday he went and was tested for Covid and it turned out negative.  He denies any cough.  He denies any change in appetite.  He denies any rhinorrhea.  He does have some mild head congestion however this is very common for him.  He denies any pain in his sinuses particularly behind his eyes or in his cheekbones.  He denies any sore throat.  He denies any otalgia.  He denies any nausea or vomiting.  He denies any abdominal pain.  He denies any dysuria urgency or frequency.  He did scratch his leg last week.  He denies any rashes however his wife states that the entire front of his right shin is erythematous and warm to the touch.  It encompasses the entire anterior right shin.  I believe this might be the source of infection.  Otherwise he has no symptoms aside from the fever Past Medical History:  Diagnosis Date  . Anxiety   . Diarrhea   . Hypertension   . OSA (obstructive sleep apnea)    cpap 9 cm H2O  . Stroke [facility])    Past Surgical History:  Procedure Laterality Date  . BIOPSY  06/21/2017   Procedure: BIOPSY;  Surgeon: Rogene Houston, MD;  [facility];  Service: Endoscopy;;  rectal, righta and left colon;  . COLONOSCOPY N/A 06/21/2017   Procedure: COLONOSCOPY;  Surgeon: Rogene Houston, MD;  [facility];  Service: Endoscopy;  Laterality: N/A;  1:25  . INGUINAL HERNIA REPAIR Right 04/04/2018   Procedure: HERNIA REPAIR INGUINAL ADULT WITH MESH;  Surgeon: Aviva Signs, MD;  Location: [facility];  Service: General;  Laterality: Right;  . INGUINAL HERNIA REPAIR Right 01/31/2019   Procedure: RECURRENT RIGHT INGUINAL HERNIORRHAPY WITH MESH, INCARCERATED;  Surgeon: Aviva Signs, MD;  Location: [facility];   Service: General;  Laterality: Right;  . KNEE SURGERY     tore mcl in high school (70's)   Current Outpatient Medications on File Prior to Visit  Medication Sig Dispense Refill  . aspirin 325 MG tablet Take 1 tablet (325 mg total) by mouth daily. 30 tablet 0  . atorvastatin (LIPITOR) 40 MG tablet TAKE 1 TABLET BY MOUTH DAILY AT 6 PM (Patient taking differently: Take by mouth daily. ) 90 tablet 3  . Calcium Carbonate Antacid (CALCIUM CARBONATE PO) Take by mouth. Unknown dose    . Cholecalciferol (VITAMIN D3) 2000 units TABS Take 2,000 Units by mouth daily.    . diphenhydrAMINE (BENADRYL) 25 MG tablet Take 25 mg by mouth at bedtime as needed.     Marland Kitchen escitalopram (LEXAPRO) 10 MG tablet Take 1 tablet (10 mg total) by mouth daily. 30 tablet 3  . hydrochlorothiazide (HYDRODIURIL) 25 MG tablet TAKE ONE (1) TABLET BY MOUTH EVERY DAY 90 tablet 3  . losartan (COZAAR) 100 MG tablet TAKE ONE (1) TABLET BY MOUTH EVERY DAY 90 tablet 3  . Mesalamine (DELZICOL) 400 MG CPDR DR capsule Take 2 capsules (800 mg total) by mouth 2 (two) times daily. 360 capsule 3  . metoprolol succinate (TOPROL-XL) 25 MG 24 hr tablet TAKE ONE TABLET (25MG TOTAL) BY  MOUTH DAILY 90 tablet 3   No current facility-administered medications on file prior to visit.   No Known Allergies Social History   Socioeconomic History  . Marital status: Married    Spouse name: [name]  . Number of children: Not on file  . Years of education: Not on file  . Highest education level: Not on file  Occupational History  . Not on file  Tobacco Use  . Smoking status: Never Smoker  . Smokeless tobacco: Never Used  Substance and Sexual Activity  . Alcohol use: No    Alcohol/week: 0.0 standard drinks  . Drug use: No  . Sexual activity: Yes  Other Topics Concern  . Not on file  Social History Narrative   Lives with wife   Social Determinants of Health   Financial Resource Strain:   . Difficulty of Paying Living Expenses: Not on file  Food  Insecurity:   . Worried About Charity fundraiser in the Last Year: Not on file  . Ran Out of Food in the Last Year: Not on file  Transportation Needs:   . Lack of Transportation (Medical): Not on file  . Lack of Transportation (Non-Medical): Not on file  Physical Activity:   . Days of Exercise per Week: Not on file  . Minutes of Exercise per Session: Not on file  Stress:   . Feeling of Stress : Not on file  Social Connections:   . Frequency of Communication with Friends and Family: Not on file  . Frequency of Social Gatherings with Friends and Family: Not on file  . Attends Religious Services: Not on file  . Active Member of Clubs or Organizations: Not on file  . Attends Archivist Meetings: Not on file  . Marital Status: Not on file  Intimate Partner Violence:   . Fear of Current or Ex-Partner: Not on file  . Emotionally Abused: Not on file  . Physically Abused: Not on file  . Sexually Abused: Not on file     Review of Systems  All other systems reviewed and are negative.      Objective:   Physical Exam    Physical exam could not be performed today as patient was seen as a telephone visit    Assessment & Plan:  Cellulitis of right lower extremity  Patient tested negative for Covid on Friday.  I believe that the chance of a false negative is unlikely.  Other possibilities include seasonal influenza.  However given the bright erythematous patch of skin on his right leg and the warmth and pain and the size of the erythema I believe the patient may have cellulitis.  Begin Keflex 500 mg 4 times daily for 7 days.  Recheck immediately if symptoms worsen.  Patient consented to be seen via telephone

## 2019-10-29 ENCOUNTER — Encounter: Payer: Self-pay | Admitting: Family Medicine

## 2019-11-21 ENCOUNTER — Other Ambulatory Visit: Payer: Self-pay

## 2019-11-21 ENCOUNTER — Encounter: Payer: Self-pay | Admitting: Family Medicine

## 2019-11-21 ENCOUNTER — Ambulatory Visit: Payer: BC Managed Care – PPO | Admitting: Family Medicine

## 2019-11-21 MED ORDER — AMLODIPINE BESYLATE 10 MG PO TABS: 10.0000 mg | ORAL_TABLET | Freq: Every day | ORAL | 3 refills | Status: DC

## 2019-11-21 NOTE — Progress Notes (Signed)
Subjective:    Patient ID: Justin [name], male    DOB: [birth year], 64 y.o.   MRN: [id number]   Patient is a very pleasant 64 year old Caucasian male who presents today for follow-up of hypercalcemia.  Patient saw his neurologist in November and on screening lab work his calcium level was found to be mildly elevated at 10.5.  Of note the patient is taking a vitamin D supplement.  He is also on hydrochlorothiazide.  Although it is on his medication list he denies taking any calcium supplements.  They did start him recently on Lexapro for possible depression contributing to memory loss.  Otherwise he is doing well with no concerns. Past Medical History:  Diagnosis Date  . Anxiety   . Diarrhea   . Hypertension   . OSA (obstructive sleep apnea)    cpap 9 cm H2O  . Stroke [facility])    Past Surgical History:  Procedure Laterality Date  . BIOPSY  06/21/2017   Procedure: BIOPSY;  Surgeon: Rogene Houston, MD;  Location[facility];  Service: Endoscopy;;  rectal, righta and left colon;  . COLONOSCOPY N/A 06/21/2017   Procedure: COLONOSCOPY;  Surgeon: Rogene Houston, MD;  Location: [facility];  Service: Endoscopy;  Laterality: N/A;  1:25  . INGUINAL HERNIA REPAIR Right 04/04/2018   Procedure: HERNIA REPAIR INGUINAL ADULT WITH MESH;  Surgeon: Aviva Signs, MD;  Location: [facility] ORS;  Service: General;  Laterality: Right;  . INGUINAL HERNIA REPAIR Right 01/31/2019   Procedure: RECURRENT RIGHT INGUINAL HERNIORRHAPY WITH MESH, INCARCERATED;  Surgeon: Aviva Signs, MD;  Location: [facility] OR[facility];  Service: General;  Laterality: Right;  . KNEE SURGERY     tore mcl in high school (70's)   Current Outpatient Medications on File Prior to Visit  Medication Sig Dispense Refill  . aspirin 325 MG tablet Take 1 tablet (325 mg total) by mouth daily. 30 tablet 0  . atorvastatin (LIPITOR) 40 MG tablet TAKE 1 TABLET BY MOUTH DAILY AT 6 PM (Patient taking differently: Take by mouth daily. ) 90 tablet 3  . Calcium Carbonate  Antacid (CALCIUM CARBONATE PO) Take by mouth. Unknown dose    . cephALEXin (KEFLEX) 500 MG capsule Take 1 capsule (500 mg total) by mouth 4 (four) times daily. 28 capsule 0  . Cholecalciferol (VITAMIN D3) 2000 units TABS Take 2,000 Units by mouth daily.    . diphenhydrAMINE (BENADRYL) 25 MG tablet Take 25 mg by mouth at bedtime as needed.     Marland Kitchen escitalopram (LEXAPRO) 10 MG tablet Take 1 tablet (10 mg total) by mouth daily. 30 tablet 3  . hydrochlorothiazide (HYDRODIURIL) 25 MG tablet TAKE ONE (1) TABLET BY MOUTH EVERY DAY 90 tablet 3  . losartan (COZAAR) 100 MG tablet TAKE ONE (1) TABLET BY MOUTH EVERY DAY 90 tablet 3  . Mesalamine (DELZICOL) 400 MG CPDR DR capsule Take 2 capsules (800 mg total) by mouth 2 (two) times daily. 360 capsule 3  . metoprolol succinate (TOPROL-XL) 25 MG 24 hr tablet TAKE ONE TABLET (25MG TOTAL) BY MOUTH DAILY 90 tablet 3   No current facility-administered medications on file prior to visit.   No Known Allergies Social History   Socioeconomic History  . Marital status: Married    Spouse name: [name]  . Number of children: Not on file  . Years of education: Not on file  . Highest education level: Not on file  Occupational History  . Not on file  Tobacco Use  . Smoking  status: Never Smoker  . Smokeless tobacco: Never Used  Substance and Sexual Activity  . Alcohol use: No    Alcohol/week: 0.0 standard drinks  . Drug use: No  . Sexual activity: Yes  Other Topics Concern  . Not on file  Social History Narrative   Lives with wife   Social Determinants of Health   Financial Resource Strain:   . Difficulty of Paying Living Expenses: Not on file  Food Insecurity:   . Worried About Charity fundraiser in the Last Year: Not on file  . Ran Out of Food in the Last Year: Not on file  Transportation Needs:   . Lack of Transportation (Medical): Not on file  . Lack of Transportation (Non-Medical): Not on file  Physical Activity:   . Days of Exercise per Week:  Not on file  . Minutes of Exercise per Session: Not on file  Stress:   . Feeling of Stress : Not on file  Social Connections:   . Frequency of Communication with Friends and Family: Not on file  . Frequency of Social Gatherings with Friends and Family: Not on file  . Attends Religious Services: Not on file  . Active Member of Clubs or Organizations: Not on file  . Attends Archivist Meetings: Not on file  . Marital Status: Not on file  Intimate Partner Violence:   . Fear of Current or Ex-Partner: Not on file  . Emotionally Abused: Not on file  . Physically Abused: Not on file  . Sexually Abused: Not on file   Family History  Problem Relation Age of Onset  . Tuberculosis Mother   . Stroke Mother   . Heart disease Father        died at 42  . Bradycardia Brother        pacemaker  . Stroke Brother   . Cancer Paternal Uncle   . Mental illness Paternal Grandfather        suicide at 4  . Colon cancer Neg Hx       Review of Systems  All other systems reviewed and are negative.      Objective:   Physical Exam  Constitutional: He is oriented to person, place, and time. He appears well-developed and well-nourished. No distress.  HENT:  Head: Normocephalic and atraumatic.  Right Ear: External ear normal.  Left Ear: External ear normal.  Nose: Nose normal.  Mouth/Throat: Oropharynx is clear and moist. No oropharyngeal exudate.  Eyes: Pupils are equal, round, and reactive to light. Conjunctivae and EOM are normal. Right eye exhibits no discharge. Left eye exhibits no discharge. No scleral icterus.  Neck: No JVD present. No tracheal deviation present. No thyromegaly present.  Cardiovascular: Normal rate, regular rhythm, normal heart sounds and intact distal pulses. Exam reveals no gallop and no friction rub.  No murmur heard. Pulmonary/Chest: Effort normal and breath sounds normal. No stridor. No respiratory distress. He has no wheezes. He has no rales. He exhibits no  tenderness.  Abdominal: Soft. Bowel sounds are normal. He exhibits no distension and no mass. There is no abdominal tenderness. There is no rebound and no guarding.  Musculoskeletal:        General: No tenderness, deformity or edema. Normal range of motion.     Cervical back: Normal range of motion and neck supple.  Lymphadenopathy:    He has no cervical adenopathy.  Neurological: He is alert and oriented to person, place, and time. He has normal reflexes. No  cranial nerve deficit. He exhibits normal muscle tone. Coordination normal.  Skin: Skin is warm. No rash noted. He is not diaphoretic. No erythema. No pallor.  Psychiatric: He has a normal mood and affect. His behavior is normal. Judgment and thought content normal.  Vitals reviewed.         Assessment & Plan:  Hypercalcemia - Plan: COMPLETE METABOLIC PANEL WITH GFR, Parathyroid hormone, intact (no Ca)  I suspect this is due to medication.  I have recommended discontinuation of the vitamin D supplement as well as the hydrochlorothiazide.  Replace the hydrochlorothiazide with amlodipine 10 mg a day.  Monitor for any swelling in the ankles.  I will check lab work today to monitor for hyperparathyroidism.  If his PTH level is normal I would recommend rechecking a calcium level in 2 weeks off of his medications and I suspect that will have normalized.

## 2019-11-22 LAB — COMPLETE METABOLIC PANEL WITH GFR
AG Ratio: 1.4 (calc) (ref 1.0–2.5)
ALT: 31 U/L (ref 9–46)
AST: 28 U/L (ref 10–35)
Albumin: 4.1 g/dL (ref 3.6–5.1)
Alkaline phosphatase (APISO): 78 U/L (ref 35–144)
BUN: 25 mg/dL (ref 7–25)
CO2: 29 mmol/L (ref 20–32)
Calcium: 10.1 mg/dL (ref 8.6–10.3)
Chloride: 101 mmol/L (ref 98–110)
Creat: 1.08 mg/dL (ref 0.70–1.25)
GFR, Est African American: 84 mL/min/{1.73_m2} (ref >=60)
GFR, Est Non African American: 72 mL/min/{1.73_m2} (ref >=60)
Globulin: 2.9 g/dL (calc) (ref 1.9–3.7)
Glucose, Bld: 90 mg/dL (ref 65–99)
Potassium: 4.1 mmol/L (ref 3.5–5.3)
Sodium: 138 mmol/L (ref 135–146)
Total Bilirubin: 0.9 mg/dL (ref 0.2–1.2)
Total Protein: 7 g/dL (ref 6.1–8.1)

## 2019-11-22 LAB — PARATHYROID HORMONE, INTACT (NO CA): PTH: 7 pg/mL — ABNORMAL LOW (ref 14–64)

## 2019-11-22 LAB — EXTRA LAV TOP TUBE

## 2019-12-24 NOTE — Progress Notes (Signed)
PATIENT: CORDARIUS BENNING DOB: 1956-06-03  REASON FOR VISIT: follow up HISTORY FROM: patient  HISTORY OF PRESENT ILLNESS: Today 12/25/19  Mr. Dibiasio is a 64 year old male with history of obstructive sleep apnea here for CPAP compliance.  Review of his download from 11/25/2019-12/24/2019 show 93% compliance, use for greater than 4 hours 19/30 days, 63%, less than 4 hours 9 days, 30%.  Average usage was 4 hours 13 minutes.  His set pressure is 9 cmH2O, leak 95th percentile 5.1, AHI 1.0. He falls asleep in the chair watching TV at 9 pm without CPAP, his wife wakes him up around 11 PM to go to bed.  At times, he take may take the mask off during the night. His wife knows because she will hear him snoring.  Some days, his memory is worse, have not paid attention to whether sleep the night before was good. He indicates his equipment is working well.  He has less daytime drowsiness, may take short nap after working.  He works as an Clinical biochemist.  His supplier is [employer].  He is here with his wife today.  ESS  Is 7.  HISTORY HISTORY OF PRESENT ILLNESS:UPDATE 2/12/2020CM Mr. Fedie, 64 year old male returns for follow-up with a history of obstructive sleep apnea here for CPAP compliance.  He has been doing well.  Much less daytime drowsiness.  CPAP data dated 11/18/2018-12/17/2018 shows usage  greater than 4 hours at 93%.  Average usage 5 hours 59 minutes.  Set pressure 9 cm leak 95th percentile 6.8.  AHI 0.7.  He returns for reevaluation   05/24/2018 Dr. Brett Fairy: HPI:  IMANUEL PRUIETT is a 64 y.o. male ,  Seen in STROKE follow up -Mr. Fields Oros is a 64 year old male patient whom I had initially seen earlier this year for evaluation of sleep apnea unfortunately the patient underwent a right-sided inguinal hernia repair for which she had to discontinue his full size aspirin 5 days prior.  Also aspirin was removed within 2 or 3 days post surgery he did suffer a stroke.  And his main symptom was right-sided numbness and  clumsiness sensory loss of the right dominant hand.  I see him today after he has undergone a stroke work-up in the hospital which included a head CT without contrast on 7 June, an MRI MRA of head and neck, the findings were an acute lacunar infarct in the left lateral thalamus there was no hemorrhage, he had negative abnormalities he was negative for abnormalities in the vascular tree his neck MRA showed mild atherosclerosis at the origins of the left internal carotid artery and right subclavian artery but not significant stenosis and he had very mild for age nonspecific white matter signal changes these are commonly seen the small vessel disease there was no brain atrophy noted.  He had presented to the hospital ED with elevated blood pressures which may explain the lacunar appearance of a previous stroke.  Head CT was negative.  Onset of symptoms was on 13 April 2018 he was evaluated by Dr. Kerney Elbe neurologist on-call, who also mentioned that the patient has a history of obstructive sleep apnea and is currently treated at 9 cmH2O pressure.  The patient restarted on aspirin metabolic panels were normal his creatinine was 1.05 his BUN 24 which may indicate that he was slightly dehydrated, his CBC was normal but that the count was 9.6K hemoglobin 15.1 hematocrit 50.1 without any evidence of anemia normal platelet count 350 3K.  Cardiac enzymes  were negative his total cholesterol was actually rather low at 133 ng but is good "" cholesterol HDL was only 30.  He finished OT and PT- he is awaiting surgery for TKR on the left knee. Dr Amedeo Plenty follows. I would like for him to have 2-3 month of daily exercise 30 minutes, walking or biking, and some weight training. Control BP and cholesterol. Diet adjustment.  More fruit and vegetables.    HPI : Seen here as in a referral from Dr. Dennard Schaumann for a sleep consult.  I have the pleasure of meeting Mr. and Mrs. Berling today, Mr. Reinhardt is referred for evaluation of  excessive daytime sleepiness, witnessed apneas, and some confusion when aroused from sleep.Mr. Gerling has no significant medical history, his only diagnosis is a recent hernia and at one time he was evaluated by cardiology with a cardiac echo that returned normal.  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 in the day may be followed by several hours in the office.Mr. Nicoson has developed over the last years a habit of falling asleep in the den watching TV.  He may sleep there already 2 hours in a recliner before he will go over to the bedroom.  The couple at bedtime is now around 1130.  The bedroom is cool quiet and dark, the patient sleeps on a flat mattress, he sleeps on one pillow for head support one pillow between his knees and rest on his side. He will turned to supine sleep soon after he is asleep.  When he snores loudest, and that is when apneas have been witnessed. He will have always 1 sometimes 2 or 3 bathroom breaks depending on fluid intake, his first bathroom break is usually around 3:00 AM. He does not recall vivid or lucid dreams and there is no dream enactment.  He wakes up spontaneously not depending on work requirements, he seems to wake up spontaneously between 3 and 5 AM and often has difficulties to go back to sleep.  In that case he usually goes back to his recliner.  He estimates that he gets only 4-5 hours of nocturnal sleep and some nights less. He has not fallen asleep at job sites, but once he is no longer physically active or mentally stimulated he struggles to stay awake. On a week end he may take a nap- 20 minute power to 1 hour.  Sleep medical history and family sleep history:  His father died young - heavy smoker , blood clot at 35, his mother at 39, he was not aware of any sleep disorders in his  parents.  The patient has 1 older brother, unaware of any sleep issues.  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  He has never used tobacco products, he does not consume alcohol, but caffeine : Drinks colas, but not more than 1 a day.  On occasion iced tea, not coffee.  He does not drink coffee but decaffeinated tea at home.  He returned to work on July 8th.   REVIEW OF SYSTEMS: Out of a complete 14 system review of symptoms, the patient complains only of the following symptoms, and all other reviewed systems are negative.  Sleep apnea  ALLERGIES: No Known Allergies  HOME MEDICATIONS: Outpatient Medications Prior to  Visit  Medication Sig Dispense Refill  . amLODipine (NORVASC) 10 MG tablet Take 1 tablet (10 mg total) by mouth daily. 90 tablet 3  . aspirin 325 MG tablet Take 1 tablet (325 mg total) by mouth daily. 30 tablet 0  . atorvastatin (LIPITOR) 40 MG tablet TAKE 1 TABLET BY MOUTH DAILY AT 6 PM (Patient taking differently: Take by mouth daily. ) 90 tablet 3  . Calcium Carbonate Antacid (CALCIUM CARBONATE PO) Take by mouth. Unknown dose    . Cholecalciferol (VITAMIN D3) 2000 units TABS Take 2,000 Units by mouth daily.    . diphenhydrAMINE (BENADRYL) 25 MG tablet Take 25 mg by mouth at bedtime as needed.     Marland Kitchen escitalopram (LEXAPRO) 10 MG tablet Take 1 tablet (10 mg total) by mouth daily. 30 tablet 3  . hydrochlorothiazide (HYDRODIURIL) 25 MG tablet TAKE ONE (1) TABLET BY MOUTH EVERY DAY 90 tablet 3  . losartan (COZAAR) 100 MG tablet TAKE ONE (1) TABLET BY MOUTH EVERY DAY 90 tablet 3  . Mesalamine (DELZICOL) 400 MG CPDR DR capsule Take 2 capsules (800 mg total) by mouth 2 (two) times daily. 360 capsule 3  . metoprolol succinate (TOPROL-XL) 25 MG 24 hr tablet TAKE ONE TABLET (25MG TOTAL) BY MOUTH DAILY 90 tablet 3   No facility-administered medications prior to visit.    PAST MEDICAL  HISTORY: Past Medical History:  Diagnosis Date  . Anxiety   . Diarrhea   . Hypertension   . OSA (obstructive sleep apnea)    cpap 9 cm H2O  . Stroke [facility])     PAST SURGICAL HISTORY: Past Surgical History:  Procedure Laterality Date  . BIOPSY  06/21/2017   Procedure: BIOPSY;  Surgeon: Rogene Houston, MD;  [facility];  Service: Endoscopy;;  rectal, righta and left colon;  . COLONOSCOPY N/A 06/21/2017   Procedure: COLONOSCOPY;  Surgeon: Rogene Houston, MD;  Location: [facility];  Service: Endoscopy;  Laterality: N/A;  1:25  . INGUINAL HERNIA REPAIR Right 04/04/2018   Procedure: HERNIA REPAIR INGUINAL ADULT WITH MESH;  Surgeon: Aviva Signs, MD;  Location: [facility];  Service: General;  Laterality: Right;  . INGUINAL HERNIA REPAIR Right 01/31/2019   Procedure: RECURRENT RIGHT INGUINAL HERNIORRHAPY WITH MESH, INCARCERATED;  Surgeon: Aviva Signs, MD;  Location: [facility];  Service: General;  Laterality: Right;  . KNEE SURGERY     tore mcl in high school (70's)    FAMILY HISTORY: Family History  Problem Relation Age of Onset  . Tuberculosis Mother   . Stroke Mother   . Heart disease Father        died at 55  . Bradycardia Brother        pacemaker  . Stroke Brother   . Cancer Paternal Uncle   . Mental illness Paternal Grandfather        suicide at 61  . Colon cancer Neg Hx     SOCIAL HISTORY: Social History   Socioeconomic History  . Marital status: Married    Spouse name: [name]  . Number of children: Not on file  . Years of education: Not on file  . Highest education level: Not on file  Occupational History  . Not on file  Tobacco Use  . Smoking status: Never Smoker  . Smokeless tobacco: Never Used  Substance and Sexual Activity  . Alcohol use: No    Alcohol/week: 0.0 standard drinks  . Drug use: No  . Sexual activity: Yes  Other  Topics Concern  . Not on file  Social History Narrative   Lives with wife   Social Determinants of Health    Financial Resource Strain:   . Difficulty of Paying Living Expenses: Not on file  Food Insecurity:   . Worried About Charity fundraiser in the Last Year: Not on file  . Ran Out of Food in the Last Year: Not on file  Transportation Needs:   . Lack of Transportation (Medical): Not on file  . Lack of Transportation (Non-Medical): Not on file  Physical Activity:   . Days of Exercise per Week: Not on file  . Minutes of Exercise per Session: Not on file  Stress:   . Feeling of Stress : Not on file  Social Connections:   . Frequency of Communication with Friends and Family: Not on file  . Frequency of Social Gatherings with Friends and Family: Not on file  . Attends Religious Services: Not on file  . Active Member of Clubs or Organizations: Not on file  . Attends Archivist Meetings: Not on file  . Marital Status: Not on file  Intimate Partner Violence:   . Fear of Current or Ex-Partner: Not on file  . Emotionally Abused: Not on file  . Physically Abused: Not on file  . Sexually Abused: Not on file    PHYSICAL EXAM  Vitals:   12/25/19 0834  BP: 121/71  Pulse: (!) 57  Temp: (!) 97.4 F (36.3 C)  TempSrc: Oral  Weight: 205 lb 9.6 oz (93.3 kg)  Height: 6' 1"  (1.854 m)   Body mass index is 27.13 kg/m.  Generalized: Well developed, in no acute distress, MMSE today was 26/30  Neurological examination  Mentation: Alert oriented to time, place, history taking. Follows all commands speech and language fluent Cranial nerve II-XII: Pupils were equal round reactive to light. Extraocular movements were full, visual field were full on confrontational test. Facial sensation and strength were normal. Head turning and shoulder shrug were normal and symmetric. Motor: The motor testing reveals 5 over 5 strength of all 4 extremities. Good symmetric motor tone is noted throughout.  Sensory: Sensory testing is intact to soft touch on all 4 extremities. No evidence of extinction is  noted.  Coordination: Cerebellar testing reveals good finger-nose-finger and heel-to-shin bilaterally.  Gait and station: Gait is normal. Reflexes: Deep tendon reflexes are symmetric and normal bilaterally.   DIAGNOSTIC DATA (LABS, IMAGING, TESTING) - I reviewed patient records, labs, notes, testing and imaging myself where available.  Lab Results  Component Value Date   WBC 7.5 09/26/2019   HGB 16.8 09/26/2019   HCT 48.7 09/26/2019   MCV 95 09/26/2019   PLT 314 09/26/2019      Component Value Date/Time   NA 138 11/21/2019 0820   NA 139 09/26/2019 1132   K 4.1 11/21/2019 0820   CL 101 11/21/2019 0820   CO2 29 11/21/2019 0820   GLUCOSE 90 11/21/2019 0820   BUN 25 11/21/2019 0820   BUN 19 09/26/2019 1132   CREATININE 1.08 11/21/2019 0820   CALCIUM 10.1 11/21/2019 0820   PROT 7.0 11/21/2019 0820   PROT 7.1 09/26/2019 1132   ALBUMIN 4.4 09/26/2019 1132   AST 28 11/21/2019 0820   ALT 31 11/21/2019 0820   ALKPHOS 97 09/26/2019 1132   BILITOT 0.9 11/21/2019 0820   BILITOT 0.8 09/26/2019 1132   GFRNONAA 72 11/21/2019 0820   GFRAA 84 11/21/2019 0820   Lab Results  Component Value  Date   CHOL 123 05/20/2019   HDL 46 05/20/2019   LDLCALC 61 05/20/2019   TRIG 82 05/20/2019   CHOLHDL 2.7 05/20/2019   Lab Results  Component Value Date   HGBA1C 5.2 04/14/2018   Lab Results  Component Value Date   GLOVFIEP32 951 09/26/2019   Lab Results  Component Value Date   TSH 1.290 09/26/2019    ASSESSMENT AND PLAN 64 y.o. year old male  has a past medical history of Anxiety, Diarrhea, Hypertension, OSA (obstructive sleep apnea), and Stroke ([place]). here with:  1.  Obstructive sleep apnea on CPAP  Review of download indicates 93% compliance of last 30 days.  Needs to improve compliance for greater than 4 hours of use every night, with currently 19/30 days, 63%.  AHI was 1.0, leak 95th percentile 5.1.  He is falling asleep in the chair without CPAP, and his wife is bringing him to  bed few hours later. May trying using CPAP in his chair or going to bed earlier so that he is using for greater than 4 hours every night.  He has memory concerns, will follow up in a few weeks with Janett Billow, NP. Try to correlate if memory seems worse, when poor sleep, less time on CPAP the night before.  He will follow-up in 1 year for CPAP, hopefully, his appointments may be combined in the future.  I spent 15 minutes with the patient. 50% of this time was spent discussing his plan of care.  Butler Denmark, AGNP-C, DNP 12/25/2019, 9:19 AM [facility] [address] [place], [place] [address] [phone]

## 2019-12-25 ENCOUNTER — Encounter: Payer: Self-pay | Admitting: Neurology

## 2019-12-25 ENCOUNTER — Other Ambulatory Visit: Payer: Self-pay

## 2019-12-25 ENCOUNTER — Ambulatory Visit (INDEPENDENT_AMBULATORY_CARE_PROVIDER_SITE_OTHER): Payer: BC Managed Care – PPO | Admitting: Neurology

## 2019-12-25 VITALS — BP 121/71 | HR 57 | Temp 97.4°F | Ht 73.0 in | Wt 205.6 lb

## 2019-12-25 DIAGNOSIS — Z9989 Dependence on other enabling machines and devices: Secondary | ICD-10-CM

## 2019-12-25 DIAGNOSIS — G4733 Obstructive sleep apnea (adult) (pediatric): Secondary | ICD-10-CM

## 2019-12-25 NOTE — Patient Instructions (Addendum)
It was great to see you today! Please try to use CPAP every night, for 4 hours or more, every night!

## 2019-12-25 NOTE — Progress Notes (Signed)
I agree with the assessment and plan as directed by NP on this visit . I was available for consultation.   Carmella Kees, MD

## 2020-01-14 ENCOUNTER — Other Ambulatory Visit: Payer: Self-pay

## 2020-01-14 ENCOUNTER — Ambulatory Visit (INDEPENDENT_AMBULATORY_CARE_PROVIDER_SITE_OTHER): Payer: BC Managed Care – PPO | Admitting: Adult Health

## 2020-01-14 ENCOUNTER — Encounter: Payer: Self-pay | Admitting: Adult Health

## 2020-01-14 VITALS — BP 125/79 | HR 51 | Temp 97.7°F | Ht 73.0 in | Wt 205.0 lb

## 2020-01-14 DIAGNOSIS — F411 Generalized anxiety disorder: Secondary | ICD-10-CM | POA: Diagnosis not present

## 2020-01-14 DIAGNOSIS — G4733 Obstructive sleep apnea (adult) (pediatric): Secondary | ICD-10-CM

## 2020-01-14 DIAGNOSIS — G3184 Mild cognitive impairment, so stated: Secondary | ICD-10-CM

## 2020-01-14 DIAGNOSIS — Z8673 Personal history of transient ischemic attack (TIA), and cerebral infarction without residual deficits: Secondary | ICD-10-CM

## 2020-01-14 DIAGNOSIS — F329 Major depressive disorder, single episode, unspecified: Secondary | ICD-10-CM

## 2020-01-14 DIAGNOSIS — Z9989 Dependence on other enabling machines and devices: Secondary | ICD-10-CM

## 2020-01-14 DIAGNOSIS — F32A Depression, unspecified: Secondary | ICD-10-CM

## 2020-01-14 NOTE — Progress Notes (Signed)
I agree with the above plan 

## 2020-01-14 NOTE — Progress Notes (Signed)
Provider:  [name], [name]  Primary Care Physician:  [name], MD  Reason for visit: Office follow-up   Chief Complaint  Patient presents with  . Follow-up    Rm9. with wife. Still has difficulty and with short term memory.    HPI:   Justin [name] is a 64 year old male presenting today, 01/14/2020 for a follow up with his wife present. He was last seen on 09/26/2019 for worsening short-term memory loss, which he and his wife feel has improved. He was started on lexapro 81m due to concerns of underlying depression/anxiety affecting memory with improvement of underlying depression/anxiety.  Most recent MMSE 26/30 on 12/25/2019 with prior 23/30.  He was previously being followed in this office due to left thalamic stroke in 04/2018 with residual right hemisensory impairment. He has continued on aspirin and atorvastatin for secondary stroke prevention. Overall patient and wife feel like his depression, anxiety and memory impairment have improved.  Recently had follow-up with SJudson Roch NP for sleep apnea and endorses ongoing compliance.  Continues on aspirin and atorvastatin for secondary stroke prevention. Blood pressure today satisfactory 125/79. No further concerns at this time.     History provided for reference purposes only Update 09/26/2019: Justin [name] a 64year old male who is being seen per patient/wife request due to worsening short-term memory loss.  He was previously being followed in this office due to left thalamic stroke in 04/2018 with residual right hemisensory impairment.  He has continued on aspirin and atorvastatin for secondary stroke prevention.  Recent lipid panel showed LDL 61.  Wife has been observing occasional short-term memory concerns over the past few months.  He has continued working and functioning without difficulty but he will occasionally forget certain conversations, triple check to ensure he is completed different tasks and forgetting certain dates.  He  does endorse increased stress over the past few months at work where he has no disease last patient with his workers and increased irritability.  He is also been experiencing worsening insomnia and has been having greater difficulty tolerating CPAP mask.  He does have a prior history of anxiety.  He was previously treated on Xanax several years prior. MMSE 23/30 greatest in recall, repetition, and drawing.  GAD-7 score 4.  PHQ 9 score 5.  After further discussing possible depression/anxiety causing concerns of memory, wife has noticed patient being more depressed recently.  Reviewing his medication list, he will take Benadryl occasionally as needed to help with sleep.  Interval history 08/23/2018: Patient returns today for stroke follow-up visit.  He did undergo 30-day cardiac monitor which did show episode of V. tach with heart rate 178 but negative for atrial fibrillation.  Upon initial evaluation in the ED, he reported numbness of the right face, right upper extremity and right lower extremity.  Patient does state that he continues to have right upper extremity numbness that is constant with a "stiff and tight feeling".  He has continued to work despite his continued numbness but does have intermittent difficulties as he is right-hand dominant.  He also does have some memory concerns stating that he is not as "sharp" as he previously was and will ask the same questions or will forget certain directions.  He continues to take aspirin without side effects of bleeding or bruising.  Continues to take Lipitor without side effects myalgias.  Blood pressure today satisfactory 135/89.  He does continue to state compliance with CPAP but has been finding himself taking  the mask off in the middle the night.  He does have a follow-up in this office for OSA management on 09/13/2018.  No further concerns at this time.  Denies new or worsening stroke/TIA symptoms.  05/24/2018 visit CD: Seen in STROKE follow up -Justin [name] is a 64 year old male patient whom I had initially seen earlier this year for evaluation of sleep apnea unfortunately the patient underwent a right-sided inguinal hernia repair for which she had to discontinue his full size aspirin 5 days prior.  Also aspirin was removed within 2 or 3 days post surgery he did suffer a stroke.  And his main symptom was right-sided numbness and clumsiness sensory loss of the right dominant hand.  I see him today after he has undergone a stroke work-up in the hospital which included a head CT without contrast on 7 June, an MRI MRA of head and neck, the findings were an acute lacunar infarct in the left lateral thalamus there was no hemorrhage, he had negative abnormalities he was negative for abnormalities in the vascular tree his neck MRA showed mild atherosclerosis at the origins of the left internal carotid artery and right subclavian artery but not significant stenosis and he had very mild for age nonspecific white matter signal changes these are commonly seen the small vessel disease there was no brain atrophy noted.  He had presented to the hospital ED with elevated blood pressures which may explain the lacunar appearance of a previous stroke.  Head CT was negative.  Onset of symptoms was on 13 April 2018 he was evaluated by Dr. Kerney Elbe neurologist on-call, who also mentioned that the patient has a history of obstructive sleep apnea and is currently treated at 9 cmH2O pressure.  The patient restarted on aspirin metabolic panels were normal his creatinine was 1.05 his BUN 24 which may indicate that he was slightly dehydrated, his CBC was normal but that the count was 9.6K hemoglobin 15.1 hematocrit 50.1 without any evidence of anemia normal platelet count 350 3K.  Cardiac enzymes were negative his total cholesterol was actually rather low at 133 ng but is good "" cholesterol HDL was only 30.      Review of Systems: Out of a complete 14 system review, the patient  complains of only the following symptoms, and all other reviewed systems are negative. Memory loss, anxiety, depression    Social History   Socioeconomic History  . Marital status: Married    Spouse name: Justin [name]  . Number of children: Not on file  . Years of education: Not on file  . Highest education level: Not on file  Occupational History  . Not on file  Tobacco Use  . Smoking status: Never Smoker  . Smokeless tobacco: Never Used  Substance and Sexual Activity  . Alcohol use: No    Alcohol/week: 0.0 standard drinks  . Drug use: No  . Sexual activity: Yes  Other Topics Concern  . Not on file  Social History Narrative   Lives with wife   Social Determinants of Health   Financial Resource Strain:   . Difficulty of Paying Living Expenses: Not on file  Food Insecurity:   . Worried About Charity fundraiser in the Last Year: Not on file  . Ran Out of Food in the Last Year: Not on file  Transportation Needs:   . Lack of Transportation (Medical): Not on file  . Lack of Transportation (Non-Medical): Not on file  Physical Activity:   .  Days of Exercise per Week: Not on file  . Minutes of Exercise per Session: Not on file  Stress:   . Feeling of Stress : Not on file  Social Connections:   . Frequency of Communication with Friends and Family: Not on file  . Frequency of Social Gatherings with Friends and Family: Not on file  . Attends Religious Services: Not on file  . Active Member of Clubs or Organizations: Not on file  . Attends Archivist Meetings: Not on file  . Marital Status: Not on file  Intimate Partner Violence:   . Fear of Current or Ex-Partner: Not on file  . Emotionally Abused: Not on file  . Physically Abused: Not on file  . Sexually Abused: Not on file    Family History  Problem Relation Age of Onset  . Tuberculosis Mother   . Stroke Mother   . Heart disease Father        died at 75  . Bradycardia Brother        pacemaker  . Stroke  Brother   . Cancer Paternal Uncle   . Mental illness Paternal Grandfather        suicide at 40  . Colon cancer Neg Hx     Past Medical History:  Diagnosis Date  . Anxiety   . Diarrhea   . Hypertension   . OSA (obstructive sleep apnea)    cpap 9 cm H2O  . Stroke [facility])     Past Surgical History:  Procedure Laterality Date  . BIOPSY  06/21/2017   Procedure: BIOPSY;  Surgeon: Rogene Houston, MD;  Location: [facility];  Service: Endoscopy;;  rectal, righta and left colon;  . COLONOSCOPY N/A 06/21/2017   Procedure: COLONOSCOPY;  Surgeon: Rogene Houston, MD;  Location: [facility];  Service: Endoscopy;  Laterality: N/A;  1:25  . INGUINAL HERNIA REPAIR Right 04/04/2018   Procedure: HERNIA REPAIR INGUINAL ADULT WITH MESH;  Surgeon: Aviva Signs, MD;  Location: [facility] ORS;  Service: General;  Laterality: Right;  . INGUINAL HERNIA REPAIR Right 01/31/2019   Procedure: RECURRENT RIGHT INGUINAL HERNIORRHAPY WITH MESH, INCARCERATED;  Surgeon: Aviva Signs, MD;  Location: [facility] OR[facility];  Service: General;  Laterality: Right;  . KNEE SURGERY     tore mcl in high school (70's)    Current Outpatient Medications  Medication Sig Dispense Refill  . amLODipine (NORVASC) 10 MG tablet Take 1 tablet (10 mg total) by mouth daily. 90 tablet 3  . Ascorbic Acid (VITAMIN C) 100 MG tablet Take 100 mg by mouth daily. OTC    . aspirin 325 MG tablet Take 1 tablet (325 mg total) by mouth daily. 30 tablet 0  . atorvastatin (LIPITOR) 40 MG tablet TAKE 1 TABLET BY MOUTH DAILY AT 6 PM (Patient taking differently: Take by mouth daily. ) 90 tablet 3  . Calcium Carbonate Antacid (CALCIUM CARBONATE PO) Take by mouth. Unknown dose    . Cholecalciferol (VITAMIN D3) 2000 units TABS Take 2,000 Units by mouth daily.    . diphenhydrAMINE (BENADRYL) 25 MG tablet Take 25 mg by mouth at bedtime as needed.     Marland Kitchen escitalopram (LEXAPRO) 10 MG tablet Take 1 tablet (10 mg total) by mouth daily. 30 tablet 3  . losartan (COZAAR) 100 MG  tablet TAKE ONE (1) TABLET BY MOUTH EVERY DAY 90 tablet 3  . Mesalamine (DELZICOL) 400 MG CPDR DR capsule Take 2 capsules (800 mg total) by mouth 2 (two) times daily. [place]  capsule 3  . metoprolol succinate (TOPROL-XL) 25 MG 24 hr tablet TAKE ONE TABLET (25MG TOTAL) BY MOUTH DAILY 90 tablet 3   No current facility-administered medications for this visit.    Allergies as of 01/14/2020  . (No Known Allergies)    Vitals: Today's Vitals   01/14/20 0948  BP: 125/79  Pulse: (!) 51  Temp: 97.7 F (36.5 C)  Weight: 205 lb (93 kg)  Height: 6' 1"  (1.854 m)   Body mass index is 27.05 kg/m.  Depression screen [facility] 2/9 01/14/2020 01/14/2020 09/26/2019  Decreased Interest 1 1 1   Down, Depressed, Hopeless 0 0 1  PHQ - 2 Score 1 1 2   Altered sleeping 1 - 0  Tired, decreased energy 1 - 1  Change in appetite 0 - 0  Feeling bad or failure about yourself  0 - 1  Trouble concentrating 1 - 1  Moving slowly or fidgety/restless 1 - 0  Suicidal thoughts 0 - 0  PHQ-9 Score 5 - 5  Difficult doing work/chores Somewhat difficult - -   GAD 7 : Generalized Anxiety Score 01/14/2020 09/26/2019  Nervous, Anxious, on Edge 1 1  Control/stop worrying 1 1  Worry too much - different things 1 1  Trouble relaxing 1 0  Restless 0 0  Easily annoyed or irritable 0 1  Afraid - awful might happen 0 0  Total GAD 7 Score 4 4  Anxiety Difficulty Somewhat difficult Somewhat difficult     Physical exam:  General: well developed, well nourished, pleasant middle-age Caucasian male, seated, in no evident distress Head: head normocephalic and atraumatic.   Neck: supple with no carotid or supraclavicular bruits Cardiovascular: regular rate and rhythm, no murmurs Musculoskeletal: no deformity Skin:  no rash/petichiae Vascular:  Normal pulses all extremities   Neurologic Exam Mental Status: Awake and fully alert. Oriented to place and time. Recent memory impaired and remote memory intact. Attention span, concentration  and fund of knowledge appropriate during visit. Mood and affect appropriate.  MMSE - Mini Mental State Exam 12/25/2019 09/26/2019  Not completed: (No Data) -  Orientation to time 3 4  Orientation to Place 5 5  Registration 3 3  Attention/ Calculation 5 3  Recall 1 1  Language- name 2 objects 2 2  Language- repeat 1 0  Language- follow 3 step command 3 3  Language- read & follow direction 1 1  Write a sentence 1 1  Copy design 1 0  Total score 26 23   Cranial Nerves:  Pupils equal, briskly reactive to light. Extraocular movements full without nystagmus. Visual fields full to confrontation. Hearing intact. Facial sensation intact. Face, tongue, palate moves normally and symmetrically.  Motor: Normal bulk and tone. Normal strength in all tested extremity muscles. Sensory.: intact to touch , pinprick , position and vibratory sensation.  Coordination: Rapid alternating movements normal in all extremities. Finger-to-nose and heel-to-shin performed accurately bilaterally. Gait and Station: Arises from chair without difficulty. Stance is normal. Gait demonstrates normal stride length and balance Reflexes: 1+ and symmetric. Toes downgoing.      Assessment: Justin [name] is a 64 year old male who is being seen today for follow-up regarding short-term memory loss complaints likely secondary to prior stroke, anxiety and depression.  History of left thalamic infarct in 04/14/2018 secondary to small vessel disease, HTN, HLD and OSA on CPAP.  MMSE 26/30.  PHQ 9 score 5.  GAD-7 score 4.  Short-term memory loss, anxiety and depression greatly improved with initiation of Lexapro  Plan:   -Continuation of Lexapro 10 mg daily for ongoing benefit of anxiety/depression. -Discussion regarding stress relaxation techniques -MMSE improved  - advised to do memory exercises such as crossword puzzles, word search, card games and reading along with staying active maintaining healthy diet -Advised to continue to stay  active and maintain a healthy diet -Ongoing use of CPAP for OSA management -recently seen by Judson Roch, NP with compliance and recommended 1 year follow-up.  Will combine stroke and CPAP follow-up at that time. -Follow up with PCP for HTN and HLD management. -Maintain strict control of hypertension with blood pressure goal below 130/90, diabetes with hemoglobin A1c goal below 6.5% and cholesterol with LDL cholesterol (bad cholesterol) goal below 70 mg/dL. I also advised the patient to eat a healthy diet with plenty of whole grains, cereals, fruits and vegetables, exercise regularly and maintain ideal body weight.   Follow-up in 4 months or call earlier if needed  I spent 30 minutes of face-to-face and non-face-to-face time with patient.  This included previsit chart review, lab review, study review, order entry, electronic health record documentation, patient education   Frann Rider, [facility]  [facility] [address] [place], [place] [address]  Phone [phone] Fax [phone] Note: This document was prepared with digital dictation and possible smart phrase technology. Any transcriptional errors that result from this process are unintentional.

## 2020-01-18 ENCOUNTER — Other Ambulatory Visit: Payer: Self-pay | Admitting: Adult Health

## 2020-05-18 ENCOUNTER — Encounter: Payer: Self-pay | Admitting: Adult Health

## 2020-05-18 ENCOUNTER — Other Ambulatory Visit: Payer: Self-pay

## 2020-05-18 ENCOUNTER — Telehealth: Payer: Self-pay | Admitting: Adult Health

## 2020-05-18 ENCOUNTER — Ambulatory Visit (INDEPENDENT_AMBULATORY_CARE_PROVIDER_SITE_OTHER): Payer: Medicare Other | Admitting: Adult Health

## 2020-05-18 VITALS — BP 125/77 | HR 50 | Ht 73.0 in | Wt 206.0 lb

## 2020-05-18 DIAGNOSIS — G4733 Obstructive sleep apnea (adult) (pediatric): Secondary | ICD-10-CM

## 2020-05-18 DIAGNOSIS — Z9989 Dependence on other enabling machines and devices: Secondary | ICD-10-CM

## 2020-05-18 DIAGNOSIS — F419 Anxiety disorder, unspecified: Secondary | ICD-10-CM | POA: Diagnosis not present

## 2020-05-18 DIAGNOSIS — F32A Depression, unspecified: Secondary | ICD-10-CM

## 2020-05-18 DIAGNOSIS — F329 Major depressive disorder, single episode, unspecified: Secondary | ICD-10-CM

## 2020-05-18 DIAGNOSIS — R4189 Other symptoms and signs involving cognitive functions and awareness: Secondary | ICD-10-CM

## 2020-05-18 DIAGNOSIS — Z8673 Personal history of transient ischemic attack (TIA), and cerebral infarction without residual deficits: Secondary | ICD-10-CM | POA: Diagnosis not present

## 2020-05-18 DIAGNOSIS — R4701 Aphasia: Secondary | ICD-10-CM

## 2020-05-18 MED ORDER — ESCITALOPRAM OXALATE 20 MG PO TABS: 20.0000 mg | ORAL_TABLET | Freq: Every day | ORAL | 3 refills | Status: DC

## 2020-05-18 NOTE — Progress Notes (Signed)
I agree with the above plan 

## 2020-05-18 NOTE — Progress Notes (Signed)
Provider:  [name], [name]  Primary Care Physician:  [name], MD  Reason for visit: Office follow-up   Chief Complaint  Patient presents with  . Follow-up    TM rm, with wife, pt reports no changes  . Memory Loss    Relatively stable but continues to have difficulty with short-term memory, multitasking and focusing  . Depression    On Lexapro -continues to experience occasional agitation/irritability  . Aphasia    Intermittent word finding difficulty -recent onset    HPI:   Today, 05/18/2020, [name] returns for follow-up accompanied by his wife. Prior concerns of short-term memory loss gradually worsening since his stroke in 05/2018.  Evidence of depression/anxiety and initiated Lexapro 10 mg daily with improvement.  He does continue to experience occasional agitation/irritability as well as occasional short-term memory deficit as well as difficulty in multitasking and focusing.  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  He also reports intermittent word finding difficulty which has been more noticeable per wife.  He does report increased fatigue taking naps after work as well as difficulty with insomnia.  Ongoing compliance with CPAP for OSA management -download report told to ensure optimal residual AHI which showed excellent compliance with residual AHI 0.9.  Continues on aspirin 325 mg daily and atorvastatin 40 mg daily for secondary stroke prevention.  Blood pressure today satisfactory 125/77.  No further concerns at this time.    History provided for reference purposes only Update 01/14/2020 JM: [name] is a 64 year old male presenting today, 01/14/2020 for a follow up with his wife present. He was last seen on 09/26/2019 for worsening short-term memory loss, which he and his wife feel has improved. He was started on lexapro 45m due to concerns of underlying depression/anxiety affecting memory with improvement of underlying  depression/anxiety.  Most recent MMSE 26/30 on 12/25/2019 with prior 23/30.  He was previously being followed in this office due to left thalamic stroke in 04/2018 with residual right hemisensory impairment. He has continued on aspirin and atorvastatin for secondary stroke prevention. Overall patient and wife feel like his depression, anxiety and memory impairment have improved.  Recently had follow-up with SJudson Roch NP for sleep apnea and endorses ongoing compliance.  Continues on aspirin and atorvastatin for secondary stroke prevention. Blood pressure today satisfactory 125/79. No further concerns at this time.   Update 09/26/2019: Mr. HWilbonis a 64year old male who is being seen per patient/wife request due to worsening short-term memory loss.  He was previously being followed in this office due to left thalamic stroke in 04/2018 with residual right hemisensory impairment.  He has continued on aspirin and atorvastatin for secondary stroke prevention.  Recent lipid panel showed LDL 61.  Wife has been observing occasional short-term memory concerns over the past few months.  He has continued working and functioning without difficulty but he will occasionally forget certain conversations, triple check to ensure he is completed different tasks and forgetting certain dates.  He does endorse increased stress over the past few months at work where he has no disease last patient with his workers and increased irritability.  He is also been experiencing worsening insomnia and has been having greater difficulty tolerating CPAP mask.  He does have a prior history of anxiety.  He was previously treated on Xanax several years prior. MMSE 23/30 greatest in recall, repetition, and drawing.  GAD-7 score 4.  PHQ 9 score 5.  After  further discussing possible depression/anxiety causing concerns of memory, wife has noticed patient being more depressed recently.  Reviewing his medication list, he will take Benadryl occasionally as needed  to help with sleep.  Interval history 08/23/2018: Patient returns today for stroke follow-up visit.  He did undergo 30-day cardiac monitor which did show episode of V. tach with heart rate 178 but negative for atrial fibrillation.  Upon initial evaluation in the ED, he reported numbness of the right face, right upper extremity and right lower extremity.  Patient does state that he continues to have right upper extremity numbness that is constant with a "stiff and tight feeling".  He has continued to work despite his continued numbness but does have intermittent difficulties as he is right-hand dominant.  He also does have some memory concerns stating that he is not as "sharp" as he previously was and will ask the same questions or will forget certain directions.  He continues to take aspirin without side effects of bleeding or bruising.  Continues to take Lipitor without side effects myalgias.  Blood pressure today satisfactory 135/89.  He does continue to state compliance with CPAP but has been finding himself taking the mask off in the middle the night.  He does have a follow-up in this office for OSA management on 09/13/2018.  No further concerns at this time.  Denies new or worsening stroke/TIA symptoms.  05/24/2018 visit CD: Seen in STROKE follow up -[name] is a 64 year old male patient whom I had initially seen earlier this year for evaluation of sleep apnea unfortunately the patient underwent a right-sided inguinal hernia repair for which she had to discontinue his full size aspirin 5 days prior.  Also aspirin was removed within 2 or 3 days post surgery he did suffer a stroke.  And his main symptom was right-sided numbness and clumsiness sensory loss of the right dominant hand.  I see him today after he has undergone a stroke work-up in the hospital which included a head CT without contrast on 7 June, an MRI MRA of head and neck, the findings were an acute lacunar infarct in the left lateral  thalamus there was no hemorrhage, he had negative abnormalities he was negative for abnormalities in the vascular tree his neck MRA showed mild atherosclerosis at the origins of the left internal carotid artery and right subclavian artery but not significant stenosis and he had very mild for age nonspecific white matter signal changes these are commonly seen the small vessel disease there was no brain atrophy noted.  He had presented to the hospital ED with elevated blood pressures which may explain the lacunar appearance of a previous stroke.  Head CT was negative.  Onset of symptoms was on 13 April 2018 he was evaluated by Dr. Kerney Elbe neurologist on-call, who also mentioned that the patient has a history of obstructive sleep apnea and is currently treated at 9 cmH2O pressure.  The patient restarted on aspirin metabolic panels were normal his creatinine was 1.05 his BUN 24 which may indicate that he was slightly dehydrated, his CBC was normal but that the count was 9.6K hemoglobin 15.1 hematocrit 50.1 without any evidence of anemia normal platelet count 350 3K.  Cardiac enzymes were negative his total cholesterol was actually rather low at 133 ng but is good "" cholesterol HDL was only 30.      Review of Systems: Out of a complete 14 system review, the patient complains of only the following symptoms, and all  other reviewed systems are negative. Word finding difficulty, memory loss, anxiety, depression    Social History   Socioeconomic History  . Marital status: Married    Spouse name: [name]  . Number of children: Not on file  . Years of education: Not on file  . Highest education level: Not on file  Occupational History  . Not on file  Tobacco Use  . Smoking status: Never Smoker  . Smokeless tobacco: Never Used  Vaping Use  . Vaping Use: Never used  Substance and Sexual Activity  . Alcohol use: No    Alcohol/week: 0.0 standard drinks  . Drug use: No  . Sexual activity: Yes  Other  Topics Concern  . Not on file  Social History Narrative   Lives with wife   Social Determinants of Health   Financial Resource Strain:   . Difficulty of Paying Living Expenses:   Food Insecurity:   . Worried About Charity fundraiser in the Last Year:   . Arboriculturist in the Last Year:   Transportation Needs:   . Film/video editor (Medical):   Marland Kitchen Lack of Transportation (Non-Medical):   Physical Activity:   . Days of Exercise per Week:   . Minutes of Exercise per Session:   Stress:   . Feeling of Stress :   Social Connections:   . Frequency of Communication with Friends and Family:   . Frequency of Social Gatherings with Friends and Family:   . Attends Religious Services:   . Active Member of Clubs or Organizations:   . Attends Archivist Meetings:   Marland Kitchen Marital Status:   Intimate Partner Violence:   . Fear of Current or Ex-Partner:   . Emotionally Abused:   Marland Kitchen Physically Abused:   . Sexually Abused:     Family History  Problem Relation Age of Onset  . Tuberculosis Mother   . Stroke Mother   . Heart disease Father        died at 54  . Bradycardia Brother        pacemaker  . Stroke Brother   . Cancer Paternal Uncle   . Mental illness Paternal Grandfather        suicide at 57  . Colon cancer Neg Hx     Past Medical History:  Diagnosis Date  . Anxiety   . Diarrhea   . Hypertension   . OSA (obstructive sleep apnea)    cpap 9 cm H2O  . Stroke [facility])     Past Surgical History:  Procedure Laterality Date  . BIOPSY  06/21/2017   Procedure: BIOPSY;  Surgeon: Rogene Houston, MD;  Location: [facility];  Service: Endoscopy;;  rectal, righta and left colon;  . COLONOSCOPY N/A 06/21/2017   Procedure: COLONOSCOPY;  Surgeon: Rogene Houston, MD;  Location: [facility];  Service: Endoscopy;  Laterality: N/A;  1:25  . INGUINAL HERNIA REPAIR Right 04/04/2018   Procedure: HERNIA REPAIR INGUINAL ADULT WITH MESH;  Surgeon: Aviva Signs, MD;  Location: [facility]  ORS;  Service: General;  Laterality: Right;  . INGUINAL HERNIA REPAIR Right 01/31/2019   Procedure: RECURRENT RIGHT INGUINAL HERNIORRHAPY WITH MESH, INCARCERATED;  Surgeon: Aviva Signs, MD;  Location: [facility] ORS;  Service: General;  Laterality: Right;  . KNEE SURGERY     tore mcl in high school (70's)    Current Outpatient Medications  Medication Sig Dispense Refill  . amLODipine (NORVASC) 10 MG tablet Take 1 tablet (10 mg  total) by mouth daily. 90 tablet 3  . Ascorbic Acid (VITAMIN C) 100 MG tablet Take 100 mg by mouth daily. OTC    . aspirin 325 MG tablet Take 1 tablet (325 mg total) by mouth daily. 30 tablet 0  . atorvastatin (LIPITOR) 40 MG tablet TAKE 1 TABLET BY MOUTH DAILY AT 6 PM (Patient taking differently: Take by mouth daily. ) 90 tablet 3  . Calcium Carbonate Antacid (CALCIUM CARBONATE PO) Take by mouth. Unknown dose    . Cholecalciferol (VITAMIN D3) 2000 units TABS Take 2,000 Units by mouth daily.    . diphenhydrAMINE (BENADRYL) 25 MG tablet Take 25 mg by mouth at bedtime as needed.     Marland Kitchen escitalopram (LEXAPRO) 20 MG tablet Take 1 tablet (20 mg total) by mouth at bedtime. 90 tablet 3  . losartan (COZAAR) 100 MG tablet TAKE ONE (1) TABLET BY MOUTH EVERY DAY 90 tablet 3  . Mesalamine (DELZICOL) 400 MG CPDR DR capsule Take 2 capsules (800 mg total) by mouth 2 (two) times daily. 360 capsule 3  . metoprolol succinate (TOPROL-XL) 25 MG 24 hr tablet TAKE ONE TABLET (25MG TOTAL) BY MOUTH DAILY 90 tablet 3   No current facility-administered medications for this visit.    Allergies as of 05/18/2020  . (No Known Allergies)    Vitals: Today's Vitals   05/18/20 1036  BP: 125/77  Pulse: (!) 50  Weight: 206 lb (93.4 kg)  Height: 6' 1"  (1.854 m)   Body mass index is 27.18 kg/m.  Depression screen [facility] 2/9 05/18/2020 01/14/2020 01/14/2020  Decreased Interest 0 1 1  Down, Depressed, Hopeless 0 0 0  PHQ - 2 Score 0 1 1  Altered sleeping 2 1 -  Tired, decreased energy 3 1 -  Change in  appetite 0 0 -  Feeling bad or failure about yourself  0 0 -  Trouble concentrating 1 1 -  Moving slowly or fidgety/restless 0 1 -  Suicidal thoughts 0 0 -  PHQ-9 Score 6 5 -  Difficult doing work/chores - Somewhat difficult -   GAD 7 : Generalized Anxiety Score 05/18/2020 01/14/2020 09/26/2019  Nervous, Anxious, on Edge 1 1 1   Control/stop worrying 0 1 1  Worry too much - different things 0 1 1  Trouble relaxing 1 1 0  Restless 0 0 0  Easily annoyed or irritable 1 0 1  Afraid - awful might happen 0 0 0  Total GAD 7 Score 3 4 4   Anxiety Difficulty - Somewhat difficult Somewhat difficult     Physical exam:  General: well developed, well nourished, pleasant middle-age Caucasian male, seated, in no evident distress Head: head normocephalic and atraumatic.   Neck: supple with no carotid or supraclavicular bruits Cardiovascular: regular rate and rhythm, no murmurs Musculoskeletal: no deformity Skin:  no rash/petichiae Vascular:  Normal pulses all extremities   Neurologic Exam Mental Status: Awake and fully alert. Oriented to place and time. Recent memory impaired and remote memory intact. Attention span, concentration and fund of knowledge appropriate during visit. Mood and affect appropriate.  MMSE - Mini Mental State Exam 05/18/2020 12/25/2019 09/26/2019  Not completed: - (No Data) -  Orientation to time 2 3 4   Orientation to Place 5 5 5   Registration 3 3 3   Attention/ Calculation 3 5 3   Recall 0 1 1  Language- name 2 objects 2 2 2   Language- repeat 1 1 0  Language- follow 3 step command 3 3 3   Language- read &  follow direction 1 1 1   Write a sentence 1 1 1   Copy design 1 1 0  Total score 22 26 23    Cranial Nerves:  Pupils equal, briskly reactive to light. Extraocular movements full without nystagmus. Visual fields full to confrontation. Hearing intact. Facial sensation intact. Face, tongue, palate moves normally and symmetrically.  Motor: Normal bulk and tone. Normal  strength in all tested extremity muscles. Sensory.: intact to touch , pinprick , position and vibratory sensation.  Coordination: Rapid alternating movements normal in all extremities. Finger-to-nose and heel-to-shin performed accurately bilaterally. Gait and Station: Arises from chair without difficulty. Stance is normal. Gait demonstrates normal stride length and balance Reflexes: 1+ and symmetric. Toes downgoing.      Assessment/plan: Justin [name] is a 64 year old male who is being seen today for follow-up regarding short-term memory loss complaints likely secondary to prior stroke, anxiety and depression.  History of left thalamic infarct in 04/14/2018 secondary to small vessel disease, HTN, HLD and OSA on CPAP.     1. Cognitive decline/impairment -MMSE declined from 26 to 22 over a 4 month period -Likely vascular from prior stroke but will evaluate further with below orders to assess for reversible causes or abnormalities Orders: - Dementia Panel - MR BRAIN W WO CONTRAST - EEG adult  2. Aphasia -Recent onset of intermittent word finding difficulty but unable to determine the exact timeframe -Possibly related to cognitive impairment with underlying depression/anxiety but will obtain MRI to rule out acute abnormalities - MR BRAIN W WO CONTRAST  3. History of stroke -Continue aspirin 325 mg daily and atorvastatin 40 mg daily -Advised to schedule follow-up visit with PCP for HTN and HLD management as well as obtaining lipid panel  4. Anxiety and depression -PHQ-9 6 -recommend increasing Lexapro dosage from 10 mg to 20 mg daily -Fatigue and insomnia may potentially be related to untreated anxiety and depression - escitalopram (LEXAPRO) 20 MG tablet; Take 1 tablet (20 mg total) by mouth at bedtime.  Dispense: 90 tablet; Refill: 3  5.  Obstructive sleep apnea -Continue CPAP for sleep apnea management -Review of compliance report showed satisfactory compliance with optimal residual apnea  at 0.9 -Insomnia and fatigue: Discussed importance of adequate sleep hygiene and use of CPAP machine even with daytime naps for optimal benefit    Follow-up in 4 months or call earlier if needed  I spent 35 minutes of face-to-face and non-face-to-face time with patient and wife.  This included previsit chart review, lab review, study review, order entry, electronic health record documentation, patient education regarding cognitive impairment, word finding difficulty, depression and anxiety, history of prior stroke and obstructive sleep apnea on CPAP and answered all questions to patient and wife satisfaction   Frann Rider, AGNP-BC  [facility] [address] [place], [place] [address]  Phone [phone] Fax [phone] Note: This document was prepared with digital dictation and possible smart phrase technology. Any transcriptional errors that result from this process are unintentional.

## 2020-05-18 NOTE — Telephone Encounter (Signed)
Medicare order sent to GI. No auth they will reach out to the patient to schedule.

## 2020-05-18 NOTE — Patient Instructions (Signed)
Your Plan:  Recommending increasing Lexapro to 20 mg nightly due to continued depression/anxiety  Recommend obtaining MRI w/wo contrast due to continued cognitive concerns, increased fatigue and word finding difficulty  Please ensure you follow-up with PCP for ongoing management of blood pressure and cholesterol  Continue to use your CPAP nightly for sleep apnea management    Follow-up in 4 months or call earlier if needed        Thank you for coming to see [place] at [facility]. I hope we have been able to provide you high quality care today.  You may receive a patient satisfaction survey over the next few weeks. We would appreciate your feedback and comments so that we may continue to improve ourselves and the health of our patients.

## 2020-05-19 LAB — DEMENTIA PANEL
Homocysteine: 15.8 umol/L (ref 0.0–17.2)
RPR Ser Ql: NONREACTIVE
TSH: 1.45 u[IU]/mL (ref 0.450–4.500)
Vitamin B-12: 501 pg/mL (ref 232–1245)

## 2020-05-20 ENCOUNTER — Telehealth: Payer: Self-pay

## 2020-05-20 NOTE — Telephone Encounter (Signed)
Pt was contacted and notified of the message below. Pt had no additional questions

## 2020-05-20 NOTE — Telephone Encounter (Signed)
-----   Message from Frann Rider, NP sent at 05/19/2020  4:47 PM EDT ----- Please advise patient that recent lab work all satisfactory

## 2020-05-29 ENCOUNTER — Ambulatory Visit
Admission: RE | Admit: 2020-05-29 | Discharge: 2020-05-29 | Disposition: A | Discharge status: Home / Self Care | Payer: Medicare Other | Source: Ambulatory Visit | Attending: Adult Health | Admitting: Adult Health

## 2020-05-29 ENCOUNTER — Other Ambulatory Visit: Payer: Self-pay

## 2020-05-29 DIAGNOSIS — R4701 Aphasia: Secondary | ICD-10-CM

## 2020-05-29 DIAGNOSIS — R4189 Other symptoms and signs involving cognitive functions and awareness: Secondary | ICD-10-CM | POA: Diagnosis not present

## 2020-05-29 MED ORDER — GADOBENATE DIMEGLUMINE 529 MG/ML IV SOLN
20.0000 mL | Freq: Once | INTRAVENOUS | Status: AC | PRN
  Administered 2020-05-29: 20 mL via INTRAVENOUS

## 2020-06-01 ENCOUNTER — Telehealth: Payer: Self-pay

## 2020-06-01 NOTE — Telephone Encounter (Signed)
Attempted to call the patient without success.  VM was full and I could not leave a message.

## 2020-06-01 NOTE — Telephone Encounter (Signed)
-----   Message from Frann Rider, NP sent at 06/01/2020  8:54 AM EDT ----- Please advise patient that recent MRI did not show any acute strokes but did show mildly progressed generalized atrophy (shrinking of the brain tissue) which could be responsible for his decline in memory.  Highly encouraged close follow-up with PCP to ensure management of blood pressure and cholesterol as well as routine exercise and healthy diet.  Also encouraged completing EEG which needs to be scheduled.

## 2020-06-02 ENCOUNTER — Telehealth: Payer: Self-pay | Admitting: *Deleted

## 2020-06-02 ENCOUNTER — Other Ambulatory Visit: Payer: Self-pay | Admitting: Family Medicine

## 2020-06-02 ENCOUNTER — Telehealth: Payer: Self-pay | Admitting: Adult Health

## 2020-06-02 NOTE — Telephone Encounter (Signed)
LVM x2 to schedule EEG, advised pt to call back and schedule.

## 2020-06-02 NOTE — Telephone Encounter (Signed)
I spoke to wife. Relayed the MRI results per JM/NP.  Recent MRI did not show any acute strokes but did show mildly progressed generalized atrophy (shrinking of the brain tissue) which could be responsible for his decline in memory. Highly encouraged close follow-up with PCP to ensure management of blood pressure and cholesterol as well as routine exercise and healthy diet. Also encouraged completing EEG which needs to be scheduled. She had not gotten call.  Messaged Harley Alto in check out to call and get scheduled.  She verbalized understanding.

## 2020-06-02 NOTE — Telephone Encounter (Signed)
-----   Message from Frann Rider, NP sent at 06/01/2020  8:54 AM EDT ----- Please advise patient that recent MRI did not show any acute strokes but did show mildly progressed generalized atrophy (shrinking of the brain tissue) which could be responsible for his decline in memory.  Highly encouraged close follow-up with PCP to ensure management of blood pressure and cholesterol as well as routine exercise and healthy diet.  Also encouraged completing EEG which needs to be scheduled.

## 2020-06-06 ENCOUNTER — Other Ambulatory Visit: Payer: Self-pay | Admitting: Family Medicine

## 2020-06-08 ENCOUNTER — Other Ambulatory Visit: Payer: Self-pay | Admitting: *Deleted

## 2020-06-08 DIAGNOSIS — R4189 Other symptoms and signs involving cognitive functions and awareness: Secondary | ICD-10-CM

## 2020-06-08 DIAGNOSIS — R4701 Aphasia: Secondary | ICD-10-CM

## 2020-06-08 DIAGNOSIS — Z8673 Personal history of transient ischemic attack (TIA), and cerebral infarction without residual deficits: Secondary | ICD-10-CM

## 2020-06-15 ENCOUNTER — Ambulatory Visit (INDEPENDENT_AMBULATORY_CARE_PROVIDER_SITE_OTHER): Payer: Medicare Other | Admitting: Neurology

## 2020-06-15 DIAGNOSIS — R4701 Aphasia: Secondary | ICD-10-CM

## 2020-06-15 DIAGNOSIS — R4189 Other symptoms and signs involving cognitive functions and awareness: Secondary | ICD-10-CM

## 2020-06-15 DIAGNOSIS — Z8673 Personal history of transient ischemic attack (TIA), and cerebral infarction without residual deficits: Secondary | ICD-10-CM

## 2020-07-03 ENCOUNTER — Other Ambulatory Visit: Payer: Self-pay

## 2020-07-03 ENCOUNTER — Ambulatory Visit
Admission: RE | Admit: 2020-07-03 | Discharge: 2020-07-03 | Disposition: A | Discharge status: Home / Self Care | Payer: Medicare Other | Source: Ambulatory Visit | Attending: Emergency Medicine | Admitting: Emergency Medicine

## 2020-07-03 VITALS — BP 122/82 | HR 58 | Temp 98.2°F | Resp 19 | Ht 72.0 in | Wt 200.0 lb

## 2020-07-03 DIAGNOSIS — R61 Generalized hyperhidrosis: Secondary | ICD-10-CM | POA: Diagnosis not present

## 2020-07-03 DIAGNOSIS — Z20822 Contact with and (suspected) exposure to covid-19: Secondary | ICD-10-CM

## 2020-07-03 NOTE — ED Triage Notes (Signed)
Pt states he woke up in the middle of the night last night and was sweating profusely. Denies any pain or any other s/s

## 2020-07-03 NOTE — Discharge Instructions (Addendum)
COVID testing ordered.  It will take between 4-7 days for test results.  Someone will contact you regarding abnormal results.    In the meantime: You should remain isolated in your home for 10 days from symptom onset AND greater than 72 hours after symptoms resolution (absence of fever without the use of fever-reducing medication and improvement in respiratory symptoms), whichever is longer Get plenty of rest and push fluids Use medications daily for symptom relief Use OTC medications like ibuprofen or tylenol as needed fever or pain Call or go to the ED if you have any new or worsening symptoms such as fever, worsening cough, shortness of breath, chest tightness, chest pain, turning blue, changes in mental status, etc..Marland Kitchen

## 2020-07-03 NOTE — ED Provider Notes (Signed)
[place]   [id number] 07/03/20 Arrival Time: 5284   CC: COVID symptoms  SUBJECTIVE: History from: patient.  Justin [name] is a 64 y.o. male who presents to the urgent care for complaint of night sweats that occurred last night.  Denies sick exposure to COVID, flu or strep.  Denies recent travel.  Has no tried any OTC medication.  Denies aggravating factors.  Denies previous symptoms in the past.   Denies fatigue, sinus pain, rhinorrhea, sore throat, SOB, wheezing, chest pain, nausea, changes in bowel or bladder habits.     ROS: As per HPI.  All other pertinent ROS negative.      Past Medical History:  Diagnosis Date  . Anxiety   . Diarrhea   . Hypertension   . OSA (obstructive sleep apnea)    cpap 9 cm H2O  . Stroke [facility])    Past Surgical History:  Procedure Laterality Date  . BIOPSY  06/21/2017   Procedure: BIOPSY;  Surgeon: Rogene Houston, MD;  [facility];  Service: Endoscopy;;  rectal, righta and left colon;  . COLONOSCOPY N/A 06/21/2017   Procedure: COLONOSCOPY;  Surgeon: Rogene Houston, MD;  Location: [facility];  Service: Endoscopy;  Laterality: N/A;  1:25  . INGUINAL HERNIA REPAIR Right 04/04/2018   Procedure: HERNIA REPAIR INGUINAL ADULT WITH MESH;  Surgeon: Aviva Signs, MD;  Location: [facility];  Service: General;  Laterality: Right;  . INGUINAL HERNIA REPAIR Right 01/31/2019   Procedure: RECURRENT RIGHT INGUINAL HERNIORRHAPY WITH MESH, INCARCERATED;  Surgeon: Aviva Signs, MD;  Location: [facility];  Service: General;  Laterality: Right;  . KNEE SURGERY     tore mcl in high school (70's)   No Known Allergies No current facility-administered medications on file prior to encounter.   Current Outpatient Medications on File Prior to Encounter  Medication Sig Dispense Refill  . amLODipine (NORVASC) 10 MG tablet Take 1 tablet (10 mg total) by mouth daily. 90 tablet 3  . Ascorbic Acid (VITAMIN C) 100 MG tablet Take 100 mg by mouth daily. OTC      . aspirin 325 MG tablet Take 1 tablet (325 mg total) by mouth daily. 30 tablet 0  . atorvastatin (LIPITOR) 40 MG tablet TAKE ONE TABLET BY MOUTH AT 6PM 90 tablet 3  . Calcium Carbonate Antacid (CALCIUM CARBONATE PO) Take by mouth. Unknown dose    . Cholecalciferol (VITAMIN D3) 2000 units TABS Take 2,000 Units by mouth daily.    . diphenhydrAMINE (BENADRYL) 25 MG tablet Take 25 mg by mouth at bedtime as needed.     Marland Kitchen escitalopram (LEXAPRO) 20 MG tablet Take 1 tablet (20 mg total) by mouth at bedtime. 90 tablet 3  . losartan (COZAAR) 100 MG tablet TAKE ONE (1) TABLET BY MOUTH EVERY DAY 90 tablet 3  . Mesalamine (DELZICOL) 400 MG CPDR DR capsule Take 2 capsules (800 mg total) by mouth 2 (two) times daily. 360 capsule 3  . metoprolol succinate (TOPROL-XL) 25 MG 24 hr tablet TAKE ONE TABLET (25MG TOTAL) BY MOUTH DAILY 90 tablet 3   Social History   Socioeconomic History  . Marital status: Married    Spouse name: [name]  . Number of children: Not on file  . Years of education: Not on file  . Highest education level: Not on file  Occupational History  . Not on file  Tobacco Use  . Smoking status: Never Smoker  . Smokeless tobacco: Never Used  Vaping Use  .  Vaping Use: Never used  Substance and Sexual Activity  . Alcohol use: No    Alcohol/week: 0.0 standard drinks  . Drug use: No  . Sexual activity: Yes  Other Topics Concern  . Not on file  Social History Narrative   Lives with wife   Social Determinants of Health   Financial Resource Strain:   . Difficulty of Paying Living Expenses: Not on file  Food Insecurity:   . Worried About Charity fundraiser in the Last Year: Not on file  . Ran Out of Food in the Last Year: Not on file  Transportation Needs:   . Lack of Transportation (Medical): Not on file  . Lack of Transportation (Non-Medical): Not on file  Physical Activity:   . Days of Exercise per Week: Not on file  . Minutes of Exercise per Session: Not on file  Stress:   .  Feeling of Stress : Not on file  Social Connections:   . Frequency of Communication with Friends and Family: Not on file  . Frequency of Social Gatherings with Friends and Family: Not on file  . Attends Religious Services: Not on file  . Active Member of Clubs or Organizations: Not on file  . Attends Archivist Meetings: Not on file  . Marital Status: Not on file  Intimate Partner Violence:   . Fear of Current or Ex-Partner: Not on file  . Emotionally Abused: Not on file  . Physically Abused: Not on file  . Sexually Abused: Not on file   Family History  Problem Relation Age of Onset  . Tuberculosis Mother   . Stroke Mother   . Heart disease Father        died at 12  . Bradycardia Brother        pacemaker  . Stroke Brother   . Cancer Paternal Uncle   . Mental illness Paternal Grandfather        suicide at 26  . Colon cancer Neg Hx     OBJECTIVE:  Vitals:   07/03/20 1107 07/03/20 1108  BP:  122/82  Pulse:  (!) 58  Resp:  19  Temp:  98.2 F (36.8 C)  TempSrc:  Oral  SpO2:  96%  Weight: 200 lb (90.7 kg)   Height: 6' (1.829 m)      General appearance: alert; appears fatigued, but nontoxic; speaking in full sentences and tolerating own secretions HEENT: NCAT; Ears: EACs clear, TMs pearly gray; Eyes: PERRL.  EOM grossly intact. Sinuses: nontender; Nose: nares patent without rhinorrhea, Throat: oropharynx clear, tonsils non erythematous or enlarged, uvula midline  Neck: supple without LAD Lungs: unlabored respirations, symmetrical air entry; no cough; no respiratory distress; CTAB Heart: regular rate and rhythm.  Radial pulses 2+ symmetrical bilaterally Skin: warm and dry Psychological: alert and cooperative; normal mood and affect  LABS:  No results found for this or any previous visit (from the past 24 hour(s)).   ASSESSMENT & PLAN:  1. Night sweats   2. Close exposure to COVID-19 virus     No orders of the defined types were placed in this  encounter.   Discharge Instructions  COVID testing ordered.  It will take between 4-7 days for test results.  Someone will contact you regarding abnormal results.    In the meantime: You should remain isolated in your home for 10 days from symptom onset AND greater than 72 hours after symptoms resolution (absence of fever without the use of fever-reducing medication and  improvement in respiratory symptoms), whichever is longer Get plenty of rest and push fluids Use medications daily for symptom relief Use OTC medications like ibuprofen or tylenol as needed fever or pain Call or go to the ED if you have any new or worsening symptoms such as fever, worsening cough, shortness of breath, chest tightness, chest pain, turning blue, changes in mental status, etc...   Reviewed expectations re: course of current medical issues. Questions answered. Outlined signs and symptoms indicating need for more acute intervention. Patient verbalized understanding. After Visit Summary given.   Note: This document was prepared using Dragon voice recognition software and may include unintentional dictation errors.       Emerson Monte, FNP 07/03/20 1123

## 2020-07-05 LAB — SARS-COV-2, NAA 2 DAY TAT

## 2020-07-05 LAB — NOVEL CORONAVIRUS, NAA: SARS-CoV-2, NAA: DETECTED — AB

## 2020-07-06 ENCOUNTER — Encounter: Payer: Self-pay | Admitting: Family Medicine

## 2020-07-06 ENCOUNTER — Other Ambulatory Visit: Payer: Self-pay | Admitting: Adult Health

## 2020-07-06 ENCOUNTER — Encounter: Payer: Self-pay | Admitting: Adult Health

## 2020-07-06 DIAGNOSIS — U071 COVID-19: Secondary | ICD-10-CM

## 2020-07-06 NOTE — Progress Notes (Signed)
I connected by phone with Justin [name] on 07/06/2020 at 11:39 AM to discuss the potential use of a new treatment for mild to moderate COVID-19 viral infection in non-hospitalized patients.  This patient is a 64 y.o. male that meets the FDA criteria for Emergency Use Authorization of COVID monoclonal antibody casirivimab/imdevimab.  Has a (+) direct SARS-CoV-2 viral test result  Has mild or moderate COVID-19   Is NOT hospitalized due to COVID-19  Is within 10 days of symptom onset  Has at least one of the high risk factor(s) for progression to severe COVID-19 and/or hospitalization as defined in EUA.  Specific high risk criteria : Older age (>/= 65 yo), BMI > 25 and Cardiovascular disease or hypertension   I have spoken and communicated the following to the patient or parent/caregiver regarding COVID monoclonal antibody treatment:  1. FDA has authorized the emergency use for the treatment of mild to moderate COVID-19 in adults and pediatric patients with positive results of direct SARS-CoV-2 viral testing who are 59 years of age and older weighing at least 40 kg, and who are at high risk for progressing to severe COVID-19 and/or hospitalization.  2. The significant known and potential risks and benefits of COVID monoclonal antibody, and the extent to which such potential risks and benefits are unknown.  3. Information on available alternative treatments and the risks and benefits of those alternatives, including clinical trials.  4. Patients treated with COVID monoclonal antibody should continue to self-isolate and use infection control measures (e.g., wear mask, isolate, social distance, avoid sharing personal items, clean and disinfect "high touch" surfaces, and frequent handwashing) according to CDC guidelines.   5. The patient or parent/caregiver has the option to accept or refuse COVID monoclonal antibody treatment.  After reviewing this information with the patient, The patient  agreed to proceed with receiving casirivimab\imdevimab infusion and will be provided a copy of the Fact sheet prior to receiving the infusion. Scot Dock 07/06/2020 11:39 AM

## 2020-07-07 ENCOUNTER — Ambulatory Visit (HOSPITAL_COMMUNITY)
Admission: RE | Admit: 2020-07-07 | Discharge: 2020-07-07 | Disposition: A | Discharge status: Home / Self Care | Payer: Medicare Other | Source: Ambulatory Visit | Attending: Pulmonary Disease | Admitting: Pulmonary Disease

## 2020-07-07 DIAGNOSIS — Z23 Encounter for immunization: Secondary | ICD-10-CM | POA: Insufficient documentation

## 2020-07-07 DIAGNOSIS — U071 COVID-19: Secondary | ICD-10-CM | POA: Insufficient documentation

## 2020-07-07 MED ORDER — EPINEPHRINE 0.3 MG/0.3ML IJ SOAJ: 0.3000 mg | Freq: Once | INTRAMUSCULAR | Status: DC | PRN

## 2020-07-07 MED ORDER — DIPHENHYDRAMINE HCL 50 MG/ML IJ SOLN: 50.0000 mg | Freq: Once | INTRAMUSCULAR | Status: DC | PRN

## 2020-07-07 MED ORDER — SODIUM CHLORIDE 0.9 % IV SOLN: INTRAVENOUS | Status: DC | PRN

## 2020-07-07 MED ORDER — METHYLPREDNISOLONE SODIUM SUCC 125 MG IJ SOLR: 125.0000 mg | Freq: Once | INTRAMUSCULAR | Status: DC | PRN

## 2020-07-07 MED ORDER — FAMOTIDINE IN NACL 20-0.9 MG/50ML-% IV SOLN: 20.0000 mg | Freq: Once | INTRAVENOUS | Status: DC | PRN

## 2020-07-07 MED ORDER — ALBUTEROL SULFATE HFA 108 (90 BASE) MCG/ACT IN AERS: 2.0000 | INHALATION_SPRAY | Freq: Once | RESPIRATORY_TRACT | Status: DC | PRN

## 2020-07-07 MED ORDER — SODIUM CHLORIDE 0.9 % IV SOLN
1200.0000 mg | Freq: Once | INTRAVENOUS | Status: AC
  Administered 2020-07-07: 1200 mg via INTRAVENOUS

## 2020-07-07 NOTE — Discharge Instructions (Signed)

## 2020-07-07 NOTE — Progress Notes (Signed)
  Diagnosis: COVID-19  Physician:Dr Joya Gaskins  Procedure: [facility] Infusion Clinic Med: casirivimab\imdevimab infusion - Provided patient with casirivimab\imdevimab fact sheet for patients, parents and caregivers prior to infusion.  Complications: No immediate complications noted.  Discharge: Discharged home   [place], [place] 07/07/2020

## 2020-07-10 ENCOUNTER — Other Ambulatory Visit: Payer: Self-pay | Admitting: Family Medicine

## 2020-07-10 ENCOUNTER — Telehealth: Payer: Self-pay | Admitting: Family Medicine

## 2020-07-10 MED ORDER — PREDNISONE 20 MG PO TABS: ORAL_TABLET | ORAL | 0 refills | Status: DC

## 2020-07-10 NOTE — Telephone Encounter (Signed)
Patients wife called states that they are both still in the quantine of Covid. She states that he is complaining that voices and noises are muffled and she spoke with a friend that works at an E[employer] and said that he may need prednisone to help with this.  CB# [phone]

## 2020-07-14 ENCOUNTER — Ambulatory Visit (INDEPENDENT_AMBULATORY_CARE_PROVIDER_SITE_OTHER): Payer: Medicare Other | Admitting: Family Medicine

## 2020-07-14 ENCOUNTER — Other Ambulatory Visit: Payer: Self-pay

## 2020-07-14 DIAGNOSIS — H6123 Impacted cerumen, bilateral: Secondary | ICD-10-CM | POA: Diagnosis not present

## 2020-07-14 NOTE — Progress Notes (Signed)
Subjective:    Patient ID: Justin [name], male    DOB: December 26, 1955, 64 y.o.   MRN: [id number]  HPI  Patient was diagnosed with Covid over 12 days ago.  He is doing much better and has clinically improved.  However he developed hearing loss last week.  Friends were concerned that he may have sudden sensorineural hearing loss secondary to a virus.  We started him on prednisone over the weekend without him being evaluated due to the time in which we talked on Friday afternoon.  He states that he is seen no benefit from taking the prednisone.  However on examination today he has bilateral cerumen impactions in both ears.  He denies any otalgia.  He denies any pain.  He denies any headache or dizziness or vertigo Past Medical History:  Diagnosis Date  . Anxiety   . Diarrhea   . Hypertension   . OSA (obstructive sleep apnea)    cpap 9 cm H2O  . Stroke [facility])    Past Surgical History:  Procedure Laterality Date  . BIOPSY  06/21/2017   Procedure: BIOPSY;  Surgeon: Rogene Houston, MD;  [facility];  Service: Endoscopy;;  rectal, righta and left colon;  . COLONOSCOPY N/A 06/21/2017   Procedure: COLONOSCOPY;  Surgeon: Rogene Houston, MD;  [facility];  Service: Endoscopy;  Laterality: N/A;  1:25  . INGUINAL HERNIA REPAIR Right 04/04/2018   Procedure: HERNIA REPAIR INGUINAL ADULT WITH MESH;  Surgeon: Aviva Signs, MD;  Location: [facility];  Service: General;  Laterality: Right;  . INGUINAL HERNIA REPAIR Right 01/31/2019   Procedure: RECURRENT RIGHT INGUINAL HERNIORRHAPY WITH MESH, INCARCERATED;  Surgeon: Aviva Signs, MD;  Location: [facility];  Service: General;  Laterality: Right;  . KNEE SURGERY     tore mcl in high school (70's)   Current Outpatient Medications on File Prior to Visit  Medication Sig Dispense Refill  . amLODipine (NORVASC) 10 MG tablet Take 1 tablet (10 mg total) by mouth daily. 90 tablet 3  . Ascorbic Acid (VITAMIN C) 100 MG tablet Take 100 mg by mouth  daily. OTC    . aspirin 325 MG tablet Take 1 tablet (325 mg total) by mouth daily. 30 tablet 0  . atorvastatin (LIPITOR) 40 MG tablet TAKE ONE TABLET BY MOUTH AT 6PM 90 tablet 3  . Calcium Carbonate Antacid (CALCIUM CARBONATE PO) Take by mouth. Unknown dose    . Cholecalciferol (VITAMIN D3) 2000 units TABS Take 2,000 Units by mouth daily.    . diphenhydrAMINE (BENADRYL) 25 MG tablet Take 25 mg by mouth at bedtime as needed.     Marland Kitchen escitalopram (LEXAPRO) 20 MG tablet Take 1 tablet (20 mg total) by mouth at bedtime. 90 tablet 3  . losartan (COZAAR) 100 MG tablet TAKE ONE (1) TABLET BY MOUTH EVERY DAY 90 tablet 3  . Mesalamine (DELZICOL) 400 MG CPDR DR capsule Take 2 capsules (800 mg total) by mouth 2 (two) times daily. 360 capsule 3  . metoprolol succinate (TOPROL-XL) 25 MG 24 hr tablet TAKE ONE TABLET (25MG TOTAL) BY MOUTH DAILY 90 tablet 3  . predniSONE (DELTASONE) 20 MG tablet 3 tabs poqday 1-2, 2 tabs poqday 3-4, 1 tab poqday 5-6 12 tablet 0   No current facility-administered medications on file prior to visit.   No Known Allergies Social History   Socioeconomic History  . Marital status: Married    Spouse name: [name]  . Number of children: Not on file  .  Years of education: Not on file  . Highest education level: Not on file  Occupational History  . Not on file  Tobacco Use  . Smoking status: Never Smoker  . Smokeless tobacco: Never Used  Vaping Use  . Vaping Use: Never used  Substance and Sexual Activity  . Alcohol use: No    Alcohol/week: 0.0 standard drinks  . Drug use: No  . Sexual activity: Yes  Other Topics Concern  . Not on file  Social History Narrative   Lives with wife   Social Determinants of Health   Financial Resource Strain:   . Difficulty of Paying Living Expenses: Not on file  Food Insecurity:   . Worried About Charity fundraiser in the Last Year: Not on file  . Ran Out of Food in the Last Year: Not on file  Transportation Needs:   . Lack of  Transportation (Medical): Not on file  . Lack of Transportation (Non-Medical): Not on file  Physical Activity:   . Days of Exercise per Week: Not on file  . Minutes of Exercise per Session: Not on file  Stress:   . Feeling of Stress : Not on file  Social Connections:   . Frequency of Communication with Friends and Family: Not on file  . Frequency of Social Gatherings with Friends and Family: Not on file  . Attends Religious Services: Not on file  . Active Member of Clubs or Organizations: Not on file  . Attends Archivist Meetings: Not on file  . Marital Status: Not on file  Intimate Partner Violence:   . Fear of Current or Ex-Partner: Not on file  . Emotionally Abused: Not on file  . Physically Abused: Not on file  . Sexually Abused: Not on file    Review of Systems  All other systems reviewed and are negative.      Objective:   Physical Exam Constitutional:      Appearance: Normal appearance. He is normal weight. He is not ill-appearing or toxic-appearing.  HENT:     Right Ear: There is impacted cerumen.     Left Ear: There is impacted cerumen.  Cardiovascular:     Rate and Rhythm: Normal rate and regular rhythm.     Heart sounds: Normal heart sounds.  Pulmonary:     Effort: Pulmonary effort is normal.     Breath sounds: Normal breath sounds.  Neurological:     Mental Status: He is alert.    Because the patient has Covid recently, we skipped his vital signs and put him immediately in her room to avoid exposure to the clinic staff and other patients.      Assessment & Plan:  Hearing loss due to cerumen impaction, bilateral  Cerumen was removed bilaterally with a combination of irrigation/lavage and curette.  The patient tolerated the procedure well without complication and hearing improved.  Discontinue prednisone

## 2020-07-14 NOTE — Telephone Encounter (Signed)
Spoke with patient and wife by telephone Friday at 500.  Doing better from covid standpoint but has decreased hearing (ddx cerumen impaction vs sudden sensioneural hearing loss)  Will try prednisone and check here this week for exam.

## 2020-08-21 ENCOUNTER — Telehealth: Payer: Self-pay

## 2020-08-21 NOTE — Telephone Encounter (Signed)
Pt is wanting to know if there is a cheaper med that he can be placed on Mesalamine (DELZICOL) 400 MG CPDR DR capsule Medicare is paying their part and it is costing the Pt 623.00 every three months   Pt wife [phone] [name])

## 2020-08-24 ENCOUNTER — Telehealth (INDEPENDENT_AMBULATORY_CARE_PROVIDER_SITE_OTHER): Payer: Self-pay

## 2020-08-24 NOTE — Telephone Encounter (Signed)
That is prescribed by his gastroenterologist. I would recommend contacting them to see if they have a cheaper option that would be appropriate.

## 2020-08-24 NOTE — Telephone Encounter (Signed)
Noted  

## 2020-08-24 NOTE — Telephone Encounter (Signed)
If the patient or his wife could call their Insurance and ask what medication in this group they will cover, once they know this then call our office back and we will ask Dr.Rehman if it or which one of them would be best for the patient.

## 2020-08-24 NOTE — Telephone Encounter (Signed)
Justin [name] is calling stating that the Rx for Mesalimine is very expensive an they are asking for something cheaper to be called in if possible, please advise?

## 2020-08-24 NOTE — Telephone Encounter (Signed)
Justin [name] is aware and will contact their insurance company and she will call us back once they give her an answer

## 2020-08-24 NOTE — Telephone Encounter (Signed)
Wife called back checking the status of this prescription. She said that she didn't realize it was called in by gastroenterologist so she will reach out to them.

## 2020-09-04 ENCOUNTER — Other Ambulatory Visit: Payer: Self-pay | Admitting: Family Medicine

## 2020-09-20 ENCOUNTER — Ambulatory Visit: Payer: Self-pay

## 2020-09-20 ENCOUNTER — Ambulatory Visit
Admission: EM | Admit: 2020-09-20 | Discharge: 2020-09-20 | Disposition: A | Discharge status: Home / Self Care | Payer: Medicare Other | Attending: Emergency Medicine | Admitting: Emergency Medicine

## 2020-09-20 ENCOUNTER — Other Ambulatory Visit: Payer: Self-pay

## 2020-09-20 ENCOUNTER — Ambulatory Visit (INDEPENDENT_AMBULATORY_CARE_PROVIDER_SITE_OTHER): Payer: Medicare Other

## 2020-09-20 DIAGNOSIS — R0981 Nasal congestion: Secondary | ICD-10-CM

## 2020-09-20 DIAGNOSIS — R059 Cough, unspecified: Secondary | ICD-10-CM

## 2020-09-20 DIAGNOSIS — R0989 Other specified symptoms and signs involving the circulatory and respiratory systems: Secondary | ICD-10-CM | POA: Diagnosis not present

## 2020-09-20 DIAGNOSIS — J22 Unspecified acute lower respiratory infection: Secondary | ICD-10-CM

## 2020-09-20 MED ORDER — AMOXICILLIN 500 MG PO CAPS: 1000.0000 mg | ORAL_CAPSULE | Freq: Three times a day (TID) | ORAL | 0 refills | Status: AC

## 2020-09-20 MED ORDER — BENZONATATE 100 MG PO CAPS: 100.0000 mg | ORAL_CAPSULE | Freq: Three times a day (TID) | ORAL | 0 refills | Status: DC

## 2020-09-20 MED ORDER — AZITHROMYCIN 250 MG PO TABS: 250.0000 mg | ORAL_TABLET | Freq: Every day | ORAL | 0 refills | Status: DC

## 2020-09-20 NOTE — ED Provider Notes (Signed)
[place]   [id number] 09/20/20 Arrival Time: 1217   CC: Cough and fever  SUBJECTIVE: History from: patient.  Justin [name] is a 64 y.o. male who presents with fever, tmax of 101 7 days ago, sinus pain/ pressure, nasal congestion, and yellow productive cough x 2 weeks.  Denies sick exposure to COVID, flu or strep.  Has tried OTC medications without relief.  Symptoms are made worse with time.  Reports previous covid infection in the past.   Denies chills, sore throat, SOB, wheezing, chest pain, nausea, changes in bowel or bladder habits.    ROS: As per HPI.  All other pertinent ROS negative.     Past Medical History:  Diagnosis Date  . Anxiety   . Diarrhea   . Hypertension   . OSA (obstructive sleep apnea)    cpap 9 cm H2O  . Stroke [facility])    Past Surgical History:  Procedure Laterality Date  . BIOPSY  06/21/2017   Procedure: BIOPSY;  Surgeon: Rogene Houston, MD;  Location: [facility];  Service: Endoscopy;;  rectal, righta and left colon;  . COLONOSCOPY N/A 06/21/2017   Procedure: COLONOSCOPY;  Surgeon: Rogene Houston, MD;  Location: [facility];  Service: Endoscopy;  Laterality: N/A;  1:25  . INGUINAL HERNIA REPAIR Right 04/04/2018   Procedure: HERNIA REPAIR INGUINAL ADULT WITH MESH;  Surgeon: Aviva Signs, MD;  Location: [facility];  Service: General;  Laterality: Right;  . INGUINAL HERNIA REPAIR Right 01/31/2019   Procedure: RECURRENT RIGHT INGUINAL HERNIORRHAPY WITH MESH, INCARCERATED;  Surgeon: Aviva Signs, MD;  Location: [facility];  Service: General;  Laterality: Right;  . KNEE SURGERY     tore mcl in high school (70's)   No Known Allergies No current facility-administered medications on file prior to encounter.   Current Outpatient Medications on File Prior to Encounter  Medication Sig Dispense Refill  . amLODipine (NORVASC) 10 MG tablet Take 1 tablet (10 mg total) by mouth daily. 90 tablet 3  . Ascorbic Acid (VITAMIN C) 100 MG tablet Take 100 mg by mouth  daily. OTC    . aspirin 325 MG tablet Take 1 tablet (325 mg total) by mouth daily. 30 tablet 0  . atorvastatin (LIPITOR) 40 MG tablet TAKE ONE TABLET BY MOUTH AT 6PM 90 tablet 3  . Calcium Carbonate Antacid (CALCIUM CARBONATE PO) Take by mouth. Unknown dose    . Cholecalciferol (VITAMIN D3) 2000 units TABS Take 2,000 Units by mouth daily.    . diphenhydrAMINE (BENADRYL) 25 MG tablet Take 25 mg by mouth at bedtime as needed.     Marland Kitchen escitalopram (LEXAPRO) 20 MG tablet Take 1 tablet (20 mg total) by mouth at bedtime. 90 tablet 3  . losartan (COZAAR) 100 MG tablet TAKE ONE (1) TABLET BY MOUTH EVERY DAY 90 tablet 3  . Mesalamine (DELZICOL) 400 MG CPDR DR capsule Take 2 capsules (800 mg total) by mouth 2 (two) times daily. 360 capsule 3  . metoprolol succinate (TOPROL-XL) 25 MG 24 hr tablet TAKE ONE TABLET (25MG TOTAL) BY MOUTH DAILY 90 tablet 3  . predniSONE (DELTASONE) 20 MG tablet 3 tabs poqday 1-2, 2 tabs poqday 3-4, 1 tab poqday 5-6 12 tablet 0   Social History   Socioeconomic History  . Marital status: Married    Spouse name: [name]  . Number of children: Not on file  . Years of education: Not on file  . Highest education level: Not on file  Occupational History  .  Not on file  Tobacco Use  . Smoking status: Never Smoker  . Smokeless tobacco: Never Used  Vaping Use  . Vaping Use: Never used  Substance and Sexual Activity  . Alcohol use: No    Alcohol/week: 0.0 standard drinks  . Drug use: No  . Sexual activity: Yes  Other Topics Concern  . Not on file  Social History Narrative   Lives with wife   Social Determinants of Health   Financial Resource Strain:   . Difficulty of Paying Living Expenses: Not on file  Food Insecurity:   . Worried About Charity fundraiser in the Last Year: Not on file  . Ran Out of Food in the Last Year: Not on file  Transportation Needs:   . Lack of Transportation (Medical): Not on file  . Lack of Transportation (Non-Medical): Not on file    Physical Activity:   . Days of Exercise per Week: Not on file  . Minutes of Exercise per Session: Not on file  Stress:   . Feeling of Stress : Not on file  Social Connections:   . Frequency of Communication with Friends and Family: Not on file  . Frequency of Social Gatherings with Friends and Family: Not on file  . Attends Religious Services: Not on file  . Active Member of Clubs or Organizations: Not on file  . Attends Archivist Meetings: Not on file  . Marital Status: Not on file  Intimate Partner Violence:   . Fear of Current or Ex-Partner: Not on file  . Emotionally Abused: Not on file  . Physically Abused: Not on file  . Sexually Abused: Not on file   Family History  Problem Relation Age of Onset  . Tuberculosis Mother   . Stroke Mother   . Heart disease Father        died at 49  . Bradycardia Brother        pacemaker  . Stroke Brother   . Cancer Paternal Uncle   . Mental illness Paternal Grandfather        suicide at 26  . Colon cancer Neg Hx     OBJECTIVE:  Vitals:   09/20/20 1236  BP: 123/72  Pulse: 60  Resp: 20  Temp: 98.7 F (37.1 C)  SpO2: 96%     General appearance: alert; appears mildly fatigued, but nontoxic; speaking in full sentences and tolerating own secretions HEENT: NCAT; Ears: EACs clear, TMs pearly gray; Eyes: PERRL.  EOM grossly intact. Sinuses: nontender; Nose: nares patent without rhinorrhea, Throat: oropharynx clear, tonsils non erythematous or enlarged, uvula midline  Neck: supple without LAD Lungs: unlabored respirations, symmetrical air entry; cough: mild; no respiratory distress; crackles heard over LT lung Heart: regular rate and rhythm.   Skin: warm and dry Psychological: alert and cooperative; normal mood and affect; very pleasant  DIAGNOSTIC STUDIES:  DG Chest 2 View  Result Date: 09/20/2020 CLINICAL DATA:  Congestion EXAM: CHEST - 2 VIEW COMPARISON:  May 19, 2015 FINDINGS: The cardiomediastinal silhouette is  unchanged and mildly enlarged in contour. No pleural effusion. No pneumothorax. There is mild interstitial prominence. No focal consolidation. Biapical pleuroparenchymal scarring. Visualized abdomen is unremarkable. Mild degenerative changes of the thoracic spine. IMPRESSION: There is mild diffuse interstitial prominence. Findings may reflect underlying pulmonary edema versus atypical infection. Electronically Signed   By: Valentino Saxon MD   On: 09/20/2020 13:31     I have reviewed the x-rays myself and the radiologist interpretation. I am  in agreement with the radiologist interpretation.     ASSESSMENT & PLAN:  1. Nasal congestion   2. Sinus congestion   3. Cough   4. Lower respiratory infection     Meds ordered this encounter  Medications  . amoxicillin (AMOXIL) 500 MG capsule    Sig: Take 2 capsules (1,000 mg total) by mouth 3 (three) times daily for 10 days.    Dispense:  60 capsule    Refill:  0    Order Specific Question:   Supervising Provider    Answer:   Raylene Everts [[id number]]  . azithromycin (ZITHROMAX) 250 MG tablet    Sig: Take 1 tablet (250 mg total) by mouth daily. Take first 2 tablets together, then 1 every day until finished.    Dispense:  6 tablet    Refill:  0    Order Specific Question:   Supervising Provider    Answer:   Raylene Everts [[id number]]  . benzonatate (TESSALON) 100 MG capsule    Sig: Take 1 capsule (100 mg total) by mouth every 8 (eight) hours.    Dispense:  21 capsule    Refill:  0    Order Specific Question:   Supervising Provider    Answer:   Raylene Everts [[id number]]   Chest x-ray concerning for atypical infection Follow up with PCP in 6-8 weeks for repeat chest x-ray Get plenty of rest and push fluids Based on history of productive cough, fever x 2 weeks and abnormal lung sounds on exam will cover for lower respiratory tract infection Amoxicillin and z-pak prescribed.  Take as directed and to completion Tessalon Perles  prescribed for cough Use OTC zyrtec for nasal congestion, runny nose, and/or sore throat Use OTC flonase for nasal congestion and runny nose Use medications daily for symptom relief Follow up with PCP next week for recheck and to ensure symptoms are improving Use OTC medications like ibuprofen or tylenol as needed fever or pain Call or go to the ED if you have any new or worsening symptoms such as fever, worsening cough, shortness of breath, chest tightness, chest pain, turning blue, changes in mental status, etc...   Reviewed expectations re: course of current medical issues. Questions answered. Outlined signs and symptoms indicating need for more acute intervention. Patient verbalized understanding. After Visit Summary given.         Lestine Box, PA-C 09/20/20 1337

## 2020-09-20 NOTE — ED Triage Notes (Signed)
Pt presents with nasal congestion and pressure for almost 2 weeks, recently had covid

## 2020-09-20 NOTE — Discharge Instructions (Addendum)
Chest x-ray concerning for atypical infection Follow up with PCP in 6-8 weeks for repeat chest x-ray Get plenty of rest and push fluids Based on history of productive cough, fever x 2 weeks and abnormal lung sounds on exam will cover for lower respiratory tract infection Amoxicillin and z-pak prescribed.  Take as directed and to completion Tessalon Perles prescribed for cough Use OTC zyrtec for nasal congestion, runny nose, and/or sore throat Use OTC flonase for nasal congestion and runny nose Use medications daily for symptom relief Follow up with PCP next week for recheck and to ensure symptoms are improving Use OTC medications like ibuprofen or tylenol as needed fever or pain Call or go to the ED if you have any new or worsening symptoms such as fever, worsening cough, shortness of breath, chest tightness, chest pain, turning blue, changes in mental status, etc..Marland Kitchen

## 2020-09-24 ENCOUNTER — Ambulatory Visit (INDEPENDENT_AMBULATORY_CARE_PROVIDER_SITE_OTHER): Payer: Self-pay | Admitting: Gastroenterology

## 2020-09-25 ENCOUNTER — Ambulatory Visit (INDEPENDENT_AMBULATORY_CARE_PROVIDER_SITE_OTHER): Payer: Medicare Other | Admitting: Family Medicine

## 2020-09-25 ENCOUNTER — Other Ambulatory Visit: Payer: Self-pay

## 2020-09-25 VITALS — BP 140/80 | HR 55 | Temp 98.1°F | Ht 73.0 in | Wt 208.0 lb

## 2020-09-25 DIAGNOSIS — J189 Pneumonia, unspecified organism: Secondary | ICD-10-CM

## 2020-09-25 NOTE — Progress Notes (Signed)
Subjective:    Patient ID: Justin [name], male    DOB: 11-18-1955, 64 y.o.   MRN: [id number]  HPI  Last Saturday, the patient developed a fever to 101.  He felt achy all over.  He had a cough and chest congestion.  His wife took him to an urgent care where they obtained a chest x-ray that showed interstitial prominence throughout possibly concerning for an atypical infection.  They started him on Augmentin and a Z-Pak.  He is here today for follow-up.  He has not had a fever in several days.  His cough is about 50 or 60% better.  The cough is now nonproductive.  He still feels tired but he is definitely improving.  However he has been working all weekend as an Clinical biochemist and has not rested nor took it easy.  He denies any sore throat or head congestion or rhinorrhea or rash or nausea or vomiting or diarrhea Past Medical History:  Diagnosis Date  . Anxiety   . Diarrhea   . Hypertension   . OSA (obstructive sleep apnea)    cpap 9 cm H2O  . Stroke [facility])    Past Surgical History:  Procedure Laterality Date  . BIOPSY  06/21/2017   Procedure: BIOPSY;  Surgeon: Rogene Houston, MD;  [facility];  Service: Endoscopy;;  rectal, righta and left colon;  . COLONOSCOPY N/A 06/21/2017   Procedure: COLONOSCOPY;  Surgeon: Rogene Houston, MD;  [facility];  Service: Endoscopy;  Laterality: N/A;  1:25  . INGUINAL HERNIA REPAIR Right 04/04/2018   Procedure: HERNIA REPAIR INGUINAL ADULT WITH MESH;  Surgeon: Aviva Signs, MD;  Location: [facility];  Service: General;  Laterality: Right;  . INGUINAL HERNIA REPAIR Right 01/31/2019   Procedure: RECURRENT RIGHT INGUINAL HERNIORRHAPY WITH MESH, INCARCERATED;  Surgeon: Aviva Signs, MD;  Location: [facility];  Service: General;  Laterality: Right;  . KNEE SURGERY     tore mcl in high school (70's)   Current Outpatient Medications on File Prior to Visit  Medication Sig Dispense Refill  . amLODipine (NORVASC) 10 MG tablet Take 1 tablet (10 mg  total) by mouth daily. 90 tablet 3  . amoxicillin (AMOXIL) 500 MG capsule Take 2 capsules (1,000 mg total) by mouth 3 (three) times daily for 10 days. 60 capsule 0  . Ascorbic Acid (VITAMIN C) 100 MG tablet Take 100 mg by mouth daily. OTC    . aspirin 325 MG tablet Take 1 tablet (325 mg total) by mouth daily. 30 tablet 0  . atorvastatin (LIPITOR) 40 MG tablet TAKE ONE TABLET BY MOUTH AT 6PM 90 tablet 3  . azithromycin (ZITHROMAX) 250 MG tablet Take 1 tablet (250 mg total) by mouth daily. Take first 2 tablets together, then 1 every day until finished. 6 tablet 0  . benzonatate (TESSALON) 100 MG capsule Take 1 capsule (100 mg total) by mouth every 8 (eight) hours. 21 capsule 0  . Calcium Carbonate Antacid (CALCIUM CARBONATE PO) Take by mouth. Unknown dose    . Cholecalciferol (VITAMIN D3) 2000 units TABS Take 2,000 Units by mouth daily.    . diphenhydrAMINE (BENADRYL) 25 MG tablet Take 25 mg by mouth at bedtime as needed.     Marland Kitchen escitalopram (LEXAPRO) 20 MG tablet Take 1 tablet (20 mg total) by mouth at bedtime. 90 tablet 3  . losartan (COZAAR) 100 MG tablet TAKE ONE (1) TABLET BY MOUTH EVERY DAY 90 tablet 3  . Mesalamine (DELZICOL) 400  MG CPDR DR capsule Take 2 capsules (800 mg total) by mouth 2 (two) times daily. 360 capsule 3  . metoprolol succinate (TOPROL-XL) 25 MG 24 hr tablet TAKE ONE TABLET (25MG TOTAL) BY MOUTH DAILY 90 tablet 3   No current facility-administered medications on file prior to visit.   No Known Allergies Social History   Socioeconomic History  . Marital status: Married    Spouse name: [name]  . Number of children: Not on file  . Years of education: Not on file  . Highest education level: Not on file  Occupational History  . Not on file  Tobacco Use  . Smoking status: Never Smoker  . Smokeless tobacco: Never Used  Vaping Use  . Vaping Use: Never used  Substance and Sexual Activity  . Alcohol use: No    Alcohol/week: 0.0 standard drinks  . Drug use: No  . Sexual  activity: Yes  Other Topics Concern  . Not on file  Social History Narrative   Lives with wife   Social Determinants of Health   Financial Resource Strain:   . Difficulty of Paying Living Expenses: Not on file  Food Insecurity:   . Worried About Charity fundraiser in the Last Year: Not on file  . Ran Out of Food in the Last Year: Not on file  Transportation Needs:   . Lack of Transportation (Medical): Not on file  . Lack of Transportation (Non-Medical): Not on file  Physical Activity:   . Days of Exercise per Week: Not on file  . Minutes of Exercise per Session: Not on file  Stress:   . Feeling of Stress : Not on file  Social Connections:   . Frequency of Communication with Friends and Family: Not on file  . Frequency of Social Gatherings with Friends and Family: Not on file  . Attends Religious Services: Not on file  . Active Member of Clubs or Organizations: Not on file  . Attends Archivist Meetings: Not on file  . Marital Status: Not on file  Intimate Partner Violence:   . Fear of Current or Ex-Partner: Not on file  . Emotionally Abused: Not on file  . Physically Abused: Not on file  . Sexually Abused: Not on file    Review of Systems  All other systems reviewed and are negative.      Objective:   Physical Exam Vitals reviewed.  Constitutional:      Appearance: Normal appearance. He is normal weight. He is not ill-appearing or toxic-appearing.  HENT:     Right Ear: Tympanic membrane and ear canal normal.     Left Ear: Tympanic membrane and ear canal normal.     Nose: Nose normal. No congestion or rhinorrhea.     Mouth/Throat:     Mouth: Mucous membranes are moist.     Pharynx: No oropharyngeal exudate or posterior oropharyngeal erythema.  Eyes:     Conjunctiva/sclera: Conjunctivae normal.     Pupils: Pupils are equal, round, and reactive to light.  Cardiovascular:     Rate and Rhythm: Normal rate and regular rhythm.     Heart sounds: Normal  heart sounds.  Pulmonary:     Effort: Pulmonary effort is normal. No respiratory distress.     Breath sounds: Normal breath sounds. No stridor. No wheezing, rhonchi or rales.  Chest:     Chest wall: No tenderness.  Abdominal:     General: Abdomen is flat. Bowel sounds are normal.  Palpations: Abdomen is soft.  Musculoskeletal:     Right lower leg: No edema.     Left lower leg: No edema.  Skin:    Findings: No rash.  Neurological:     Mental Status: He is alert.         Assessment & Plan:  Atypical pneumonia - Plan: DG Chest 2 View  Physical exam today is totally normal.  I believe the patient likely had a virus or possibly walking pneumonia.  Regardless he seems to be doing much better.  Recommended rest and tincture of time.  Repeat chest x-ray in 3 weeks.

## 2020-09-28 ENCOUNTER — Encounter: Payer: Self-pay | Admitting: Adult Health

## 2020-09-28 ENCOUNTER — Ambulatory Visit (INDEPENDENT_AMBULATORY_CARE_PROVIDER_SITE_OTHER): Payer: Medicare Other | Admitting: Adult Health

## 2020-09-28 VITALS — BP 110/68 | HR 53 | Ht 73.0 in | Wt 203.0 lb

## 2020-09-28 DIAGNOSIS — Z9989 Dependence on other enabling machines and devices: Secondary | ICD-10-CM | POA: Diagnosis not present

## 2020-09-28 DIAGNOSIS — I69319 Unspecified symptoms and signs involving cognitive functions following cerebral infarction: Secondary | ICD-10-CM | POA: Diagnosis not present

## 2020-09-28 DIAGNOSIS — Z8673 Personal history of transient ischemic attack (TIA), and cerebral infarction without residual deficits: Secondary | ICD-10-CM | POA: Diagnosis not present

## 2020-09-28 DIAGNOSIS — G4733 Obstructive sleep apnea (adult) (pediatric): Secondary | ICD-10-CM

## 2020-09-28 NOTE — Progress Notes (Signed)
Provider:  [name], [name]  Primary Care Physician:  [name], MD  Reason for visit: Office follow-up   Chief Complaint  Patient presents with  . Follow-up    rm 9, with wife, reports memory is worse     HPI:   Today, 09/28/2020, Justin [name] returns for 24-monthfollow-up regarding history of stroke and cognitive decline.  Since prior visit, he was diagnosed with COVID 8/30 and wife has noticed slight worsening of cognition since that time but overall stable. MMSE today 22/30 (prior 22/30). MRI, EEG and lab work all unremarkable for reversible causes or abnormality. 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 Wife has been assisting with this. Does report occasional increased agitation or irritation but has been able to better manage this. Currently on lexapro 282mdaily. Denies new or recurrent stroke/TIA symptoms remaining on aspirin 325 mg daily and atorvastatin 40 mg daily without side effects.  Blood pressure today 110/68. History of OSA on CPAP with difficulty recently due to possibly walking pneumonia recently completing treatment. Review of compliance report from 08/29/2020 -09/27/2020 shows 29 out of 30 usage days for 97% compliance with only 19 days greater than 4 hours for 63% compliance. Average usage 4 hours and 18 minutes. Residual AHI 2.0 on set pressure 9 cm H2O and EPR level 3. He does report slowly increasing his tolerance as he had difficulty using towards the end of October and beginning of November. Continues to follow with DME company [employer] for any needed supplies. No further concerns.    History provided for reference purposes only Update 05/18/2020 JM: Justin [name] for follow-up accompanied by his wife. Prior concerns of short-term memory loss gradually worsening  since his stroke in 05/2018.  Evidence of depression/anxiety and initiated Lexapro 10 mg daily with improvement.  He does continue to experience occasional agitation/irritability as well as occasional short-term memory deficit as well as difficulty in multitasking and focusing.  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  He also reports intermittent word finding difficulty which has been more noticeable per wife.  He does report increased fatigue taking naps after work as well as difficulty with insomnia.  Ongoing compliance with CPAP for OSA management -download report told to ensure optimal residual AHI which showed excellent compliance with residual AHI 0.9.  Continues on aspirin 325 mg daily and atorvastatin 40 mg daily for secondary stroke prevention.  Blood pressure today satisfactory 125/77.  No further concerns at this time.  Update 01/14/2020 JM: Justin [name] a [age]ear old male presenting today, 01/14/2020 for a follow up with his wife present. He was last seen on 09/26/2019 for worsening short-term memory loss, which he and his wife feel has improved. He was started on lexapro 1028mue to concerns of underlying depression/anxiety affecting memory with improvement of underlying depression/anxiety.  Most recent MMSE 26/30 on 12/25/2019 with prior 23/30.  He was previously being followed in this office due to left thalamic stroke in 04/2018 with residual right hemisensory impairment. He has continued on aspirin and atorvastatin for secondary stroke prevention. Overall patient and wife feel like his depression, anxiety and memory impairment have improved.  Recently had follow-up with [name] for sleep apnea and endorses ongoing compliance.  Continues on aspirin  and atorvastatin for secondary stroke prevention. Blood pressure today satisfactory 125/79. No further concerns at this time.   Update 09/26/2019: Justin [name] is a 64 year old male who is being seen per patient/wife  request due to worsening short-term memory loss.  He was previously being followed in this office due to left thalamic stroke in 04/2018 with residual right hemisensory impairment.  He has continued on aspirin and atorvastatin for secondary stroke prevention.  Recent lipid panel showed LDL 61.  Wife has been observing occasional short-term memory concerns over the past few months.  He has continued working and functioning without difficulty but he will occasionally forget certain conversations, triple check to ensure he is completed different tasks and forgetting certain dates.  He does endorse increased stress over the past few months at work where he has no disease last patient with his workers and increased irritability.  He is also been experiencing worsening insomnia and has been having greater difficulty tolerating CPAP mask.  He does have a prior history of anxiety.  He was previously treated on Xanax several years prior. MMSE 23/30 greatest in recall, repetition, and drawing.  GAD-7 score 4.  PHQ 9 score 5.  After further discussing possible depression/anxiety causing concerns of memory, wife has noticed patient being more depressed recently.  Reviewing his medication list, he will take Benadryl occasionally as needed to help with sleep.  Interval history 08/23/2018: Patient returns today for stroke follow-up visit.  He did undergo 30-day cardiac monitor which did show episode of V. tach with heart rate 178 but negative for atrial fibrillation.  Upon initial evaluation in the ED, he reported numbness of the right face, right upper extremity and right lower extremity.  Patient does state that he continues to have right upper extremity numbness that is constant with a "stiff and tight feeling".  He has continued to work despite his continued numbness but does have intermittent difficulties as he is right-hand dominant.  He also does have some memory concerns stating that he is not as "sharp" as he previously  was and will ask the same questions or will forget certain directions.  He continues to take aspirin without side effects of bleeding or bruising.  Continues to take Lipitor without side effects myalgias.  Blood pressure today satisfactory 135/89.  He does continue to state compliance with CPAP but has been finding himself taking the mask off in the middle the night.  He does have a follow-up in this office for OSA management on 09/13/2018.  No further concerns at this time.  Denies new or worsening stroke/TIA symptoms.  05/24/2018 visit CD: Seen in STROKE follow up -Justin [name] is a 64 year old male patient whom I had initially seen earlier this year for evaluation of sleep apnea unfortunately the patient underwent a right-sided inguinal hernia repair for which she had to discontinue his full size aspirin 5 days prior.  Also aspirin was removed within 2 or 3 days post surgery he did suffer a stroke.  And his main symptom was right-sided numbness and clumsiness sensory loss of the right dominant hand.  I see him today after he has undergone a stroke work-up in the hospital which included a head CT without contrast on 7 June, an MRI MRA of head and neck, the findings were an acute lacunar infarct in the left lateral thalamus there was no hemorrhage, he had negative abnormalities he was negative for abnormalities in the vascular tree his neck MRA showed mild atherosclerosis at the origins  of the left internal carotid artery and right subclavian artery but not significant stenosis and he had very mild for age nonspecific white matter signal changes these are commonly seen the small vessel disease there was no brain atrophy noted.  He had presented to the hospital ED with elevated blood pressures which may explain the lacunar appearance of a previous stroke.  Head CT was negative.  Onset of symptoms was on 13 April 2018 he was evaluated by Dr. Kerney Elbe neurologist on-call, who also mentioned that the patient has  a history of obstructive sleep apnea and is currently treated at 9 cmH2O pressure.  The patient restarted on aspirin metabolic panels were normal his creatinine was 1.05 his BUN 24 which may indicate that he was slightly dehydrated, his CBC was normal but that the count was 9.6K hemoglobin 15.1 hematocrit 50.1 without any evidence of anemia normal platelet count 350 3K.  Cardiac enzymes were negative his total cholesterol was actually rather low at 133 ng but is good "" cholesterol HDL was only 30.      Review of Systems: Out of a complete 14 system review, the patient complains of only the following listed in HPI, and all other reviewed systems are negative.    Social History   Socioeconomic History  . Marital status: Married    Spouse name: Justin [name]  . Number of children: Not on file  . Years of education: Not on file  . Highest education level: Not on file  Occupational History  . Not on file  Tobacco Use  . Smoking status: Never Smoker  . Smokeless tobacco: Never Used  Vaping Use  . Vaping Use: Never used  Substance and Sexual Activity  . Alcohol use: No    Alcohol/week: 0.0 standard drinks  . Drug use: No  . Sexual activity: Yes  Other Topics Concern  . Not on file  Social History Narrative   Lives with wife   Social Determinants of Health   Financial Resource Strain:   . Difficulty of Paying Living Expenses: Not on file  Food Insecurity:   . Worried About Charity fundraiser in the Last Year: Not on file  . Ran Out of Food in the Last Year: Not on file  Transportation Needs:   . Lack of Transportation (Medical): Not on file  . Lack of Transportation (Non-Medical): Not on file  Physical Activity:   . Days of Exercise per Week: Not on file  . Minutes of Exercise per Session: Not on file  Stress:   . Feeling of Stress : Not on file  Social Connections:   . Frequency of Communication with Friends and Family: Not on file  . Frequency of Social Gatherings with  Friends and Family: Not on file  . Attends Religious Services: Not on file  . Active Member of Clubs or Organizations: Not on file  . Attends Archivist Meetings: Not on file  . Marital Status: Not on file  Intimate Partner Violence:   . Fear of Current or Ex-Partner: Not on file  . Emotionally Abused: Not on file  . Physically Abused: Not on file  . Sexually Abused: Not on file    Family History  Problem Relation Age of Onset  . Tuberculosis Mother   . Stroke Mother   . Heart disease Father        died at 31  . Bradycardia Brother        pacemaker  . Stroke Brother   .  Cancer Paternal Uncle   . Mental illness Paternal Grandfather        suicide at 20  . Colon cancer Neg Hx     Past Medical History:  Diagnosis Date  . Anxiety   . Diarrhea   . Hypertension   . OSA (obstructive sleep apnea)    cpap 9 cm H2O  . Stroke [facility])     Past Surgical History:  Procedure Laterality Date  . BIOPSY  06/21/2017   Procedure: BIOPSY;  Surgeon: Rogene Houston, MD;  Location: [facility];  Service: Endoscopy;;  rectal, righta and left colon;  . COLONOSCOPY N/A 06/21/2017   Procedure: COLONOSCOPY;  Surgeon: Rogene Houston, MD;  Location: [facility];  Service: Endoscopy;  Laterality: N/A;  1:25  . INGUINAL HERNIA REPAIR Right 04/04/2018   Procedure: HERNIA REPAIR INGUINAL ADULT WITH MESH;  Surgeon: Aviva Signs, MD;  Location: [facility] ORS;  Service: General;  Laterality: Right;  . INGUINAL HERNIA REPAIR Right 01/31/2019   Procedure: RECURRENT RIGHT INGUINAL HERNIORRHAPY WITH MESH, INCARCERATED;  Surgeon: Aviva Signs, MD;  Location: [facility] ORS;  Service: General;  Laterality: Right;  . KNEE SURGERY     tore mcl in high school (70's)    Current Outpatient Medications  Medication Sig Dispense Refill  . amLODipine (NORVASC) 10 MG tablet Take 1 tablet (10 mg total) by mouth daily. 90 tablet 3  . amoxicillin (AMOXIL) 500 MG capsule Take 2 capsules (1,000 mg total) by mouth 3  (three) times daily for 10 days. 60 capsule 0  . Ascorbic Acid (VITAMIN C) 100 MG tablet Take 100 mg by mouth daily. OTC    . aspirin 325 MG tablet Take 1 tablet (325 mg total) by mouth daily. 30 tablet 0  . atorvastatin (LIPITOR) 40 MG tablet TAKE ONE TABLET BY MOUTH AT 6PM 90 tablet 3  . Calcium Carbonate Antacid (CALCIUM CARBONATE PO) Take by mouth. Unknown dose    . Cholecalciferol (VITAMIN D3) 2000 units TABS Take 2,000 Units by mouth daily.    . diphenhydrAMINE (BENADRYL) 25 MG tablet Take 25 mg by mouth at bedtime as needed.     Marland Kitchen escitalopram (LEXAPRO) 20 MG tablet Take 1 tablet (20 mg total) by mouth at bedtime. 90 tablet 3  . losartan (COZAAR) 100 MG tablet TAKE ONE (1) TABLET BY MOUTH EVERY DAY 90 tablet 3  . Mesalamine (DELZICOL) 400 MG CPDR DR capsule Take 2 capsules (800 mg total) by mouth 2 (two) times daily. 360 capsule 3  . metoprolol succinate (TOPROL-XL) 25 MG 24 hr tablet TAKE ONE TABLET (25MG TOTAL) BY MOUTH DAILY 90 tablet 3   No current facility-administered medications for this visit.    Allergies as of 09/28/2020  . (No Known Allergies)    Vitals: Today's Vitals   09/28/20 0934  BP: 110/68  Pulse: (!) 53  Weight: 203 lb (92.1 kg)  Height: 6' 1"  (1.854 m)   Body mass index is 26.78 kg/m.   Physical exam: General: well developed, well nourished, pleasant middle-age Caucasian male, seated, in no evident distress Head: head normocephalic and atraumatic.   Neck: supple with no carotid or supraclavicular bruits Cardiovascular: regular rate and rhythm, no murmurs Musculoskeletal: no deformity Skin:  no rash/petichiae Vascular:  Normal pulses all extremities   Neurologic Exam Mental Status: Awake and fully alert. Oriented to place and time. Recent memory impaired and remote memory intact. Attention span, concentration and fund of knowledge appropriate during visit. Mood and affect appropriate.  MMSE - Mini Mental State Exam 09/28/2020 05/18/2020 12/25/2019   Not completed: - - (No Data)  Orientation to time 4 2 3   Orientation to Place 5 5 5   Registration 3 3 3   Attention/ Calculation 2 3 5   Recall 0 0 1  Language- name 2 objects 2 2 2   Language- repeat 1 1 1   Language- follow 3 step command 3 3 3   Language- read & follow direction 1 1 1   Write a sentence 1 1 1   Copy design 0 1 1  Copy design-comments 6 animals - -  Total score 22 22 26    Cranial Nerves:  Pupils equal, briskly reactive to light. Extraocular movements full without nystagmus. Visual fields full to confrontation. Hearing intact. Facial sensation intact. Face, tongue, palate moves normally and symmetrically.  Motor: Normal bulk and tone. Normal strength in all tested extremity muscles. Sensory.: intact to touch , pinprick , position and vibratory sensation.  Coordination: Rapid alternating movements normal in all extremities. Finger-to-nose and heel-to-shin performed accurately bilaterally. Gait and Station: Arises from chair without difficulty. Stance is normal. Gait demonstrates normal stride length and balance without use of assistive device Reflexes: 1+ and symmetric. Toes downgoing.       Assessment/plan: Justin [name] is a 64 year old male who is being seen today for follow-up regarding short-term memory loss complaints likely secondary to prior stroke, anxiety and depression.  History of left thalamic infarct in 04/14/2018 secondary to small vessel disease, HTN, HLD and OSA on CPAP.      1. Mild cognitive impairment with depression/anxiety -MMSE 22/30 -stable compared to prior visit -Slight worsening after recent Covid infection 06/2020 -Continue Lexapro 20 mg daily for anxiety/depression possibly associated with cognitive impairment  -Discussed importance of memory exercises, managing stroke risk factors, healthy diet, routine exercise and adequate sleep. He is not interested in initiating any medications such as Namenda or Aricept at this time -MRI, EEG and lab work  all negative for reversible causes   2. History of stroke -Continue aspirin 325 mg daily and atorvastatin 40 mg daily -Advised to schedule follow-up visit with PCP for HTN and HLD management as well as obtaining lipid panel  3.  Obstructive sleep apnea -Recent difficulty with CPAP tolerance due to COVID-19 infection and recently treated for pneumonia. Tolerance has been slowly improving. Advised patient to call office in the next 1 to 2 months to repeat CPAP download to ensure improvement of compliance. -Continue to follow with DME company [employer] for any ongoing supplies or CPAP related concerns    Follow-up in 6 months or call earlier if needed   CC:  [place] provider: Dr. Elissa Hefty, [name], MD     I spent 40 minutes of face-to-face and non-face-to-face time with patient and wife.  This included previsit chart review, lab review, study review, order entry, electronic health record documentation, patient education regarding cognitive impairment with depression and anxiety, typical progression and potential future medication options, history of prior stroke and importance of managing stroke risk factors and obstructive sleep apnea on CPAP with review of compliance report download and answered all other questions to patient and wife satisfaction   Frann Rider, AGNP-BC  [facility] [address] [place], [place] [address]  Phone [phone] Fax [phone] Note: This document was prepared with digital dictation and possible smart phrase technology. Any transcriptional errors that result from this process are unintentional.

## 2020-09-28 NOTE — Patient Instructions (Addendum)
Continue to follow with [employer] for CPAP related supplies or concerns Please call in the next 1-2 months once you are able to use your CPAP for a more prolonged period of time so we can recheck a compliance report  Continue lexapro 48m daily   May consider use of namenda or aricept in the future which may help slow progression of memory loss and can help with associated anxiety, frustrations or irriations May also consider formal neurocognitive evaluation if needed in the future  Continue aspirin 81 mg daily  and lipitor  for secondary stroke prevention  Continue to follow up with PCP regarding cholesterol and blood pressure management - recommend follow up in the next 3-4 months with your PCP to obtain cholesterol levels  Maintain strict control of hypertension with blood pressure goal below 130/90 and cholesterol with LDL cholesterol (bad cholesterol) goal below 70 mg/dL.      Followup in the future with me in 6 months or call earlier if needed      Thank you for coming [facility]. I hope we have been able to provide you high quality care today.  You may receive a patient satisfaction survey over the next few weeks. We would appreciate your feedback and comments so that we may continue to improve ourselves and the health of our patients.    (Namenda) Memantine Tablets What is this medicine? MEMANTINE (MEM an teen) is used to treat dementia caused by Alzheimer's disease. This medicine may be used for other purposes; ask your health care provider or pharmacist if you have questions. COMMON BRAND NAME(S): Namenda What should I tell my health care provider before I take this medicine? They need to know if you have any of these conditions:  difficulty passing urine  kidney disease  liver disease  seizures  an unusual or allergic reaction to memantine, other medicines, foods, dyes, or preservatives  pregnant or trying to get  pregnant  breast-feeding How should I use this medicine? Take this medicine by mouth with a glass of water. Follow the directions on the prescription label. You may take this medicine with or without food. Take your doses at regular intervals. Do not take your medicine more often than directed. Continue to take your medicine even if you feel better. Do not stop taking except on the advice of your doctor or health care professional. Talk to your pediatrician regarding the use of this medicine in children. Special care may be needed. Overdosage: If you think you have taken too much of this medicine contact a poison control center or emergency room at once. NOTE: This medicine is only for you. Do not share this medicine with others. What if I miss a dose? If you miss a dose, take it as soon as you can. If it is almost time for your next dose, take only that dose. Do not take double or extra doses. If you do not take your medicine for several days, contact your health care provider. Your dose may need to be changed. What may interact with this medicine?  acetazolamide  amantadine  cimetidine  dextromethorphan  dofetilide  hydrochlorothiazide  ketamine  metformin  methazolamide  quinidine  ranitidine  sodium bicarbonate  triamterene This list may not describe all possible interactions. Give your health care provider a list of all the medicines, herbs, non-prescription drugs, or dietary supplements you use. Also tell them if you smoke, drink alcohol, or use illegal drugs. Some items may  interact with your medicine. What should I watch for while using this medicine? Visit your doctor or health care professional for regular checks on your progress. Check with your doctor or health care professional if there is no improvement in your symptoms or if they get worse. You may get drowsy or dizzy. Do not drive, use machinery, or do anything that needs mental alertness until you know how  this drug affects you. Do not stand or sit up quickly, especially if you are an older patient. This reduces the risk of dizzy or fainting spells. Alcohol can make you more drowsy and dizzy. Avoid alcoholic drinks. What side effects may I notice from receiving this medicine? Side effects that you should report to your doctor or health care professional as soon as possible:  allergic reactions like skin rash, itching or hives, swelling of the face, lips, or tongue  agitation or a feeling of restlessness  depressed mood  dizziness  hallucinations  redness, blistering, peeling or loosening of the skin, including inside the mouth  seizures  vomiting Side effects that usually do not require medical attention (report to your doctor or health care professional if they continue or are bothersome):  constipation  diarrhea  headache  nausea  trouble sleeping This list may not describe all possible side effects. Call your doctor for medical advice about side effects. You may report side effects to FDA at 1-800-FDA-1088. Where should I keep my medicine? Keep out of the reach of children. Store at room temperature between 15 degrees and 30 degrees C (59 degrees and 86 degrees F). Throw away any unused medicine after the expiration date. NOTE: This sheet is a summary. It may not cover all possible information. If you have questions about this medicine, talk to your doctor, pharmacist, or health care provider.  2020 Elsevier/Gold Standard (2013-08-12 14:10:42)

## 2020-09-29 ENCOUNTER — Ambulatory Visit (INDEPENDENT_AMBULATORY_CARE_PROVIDER_SITE_OTHER): Payer: Medicare Other | Admitting: Gastroenterology

## 2020-09-29 ENCOUNTER — Ambulatory Visit (INDEPENDENT_AMBULATORY_CARE_PROVIDER_SITE_OTHER): Payer: Self-pay | Admitting: Internal Medicine

## 2020-09-29 ENCOUNTER — Other Ambulatory Visit: Payer: Self-pay

## 2020-09-29 ENCOUNTER — Encounter (INDEPENDENT_AMBULATORY_CARE_PROVIDER_SITE_OTHER): Payer: Self-pay | Admitting: Gastroenterology

## 2020-09-29 VITALS — BP 118/66 | HR 53 | Temp 97.6°F | Ht 73.0 in | Wt 204.0 lb

## 2020-09-29 DIAGNOSIS — K5100 Ulcerative (chronic) pancolitis without complications: Secondary | ICD-10-CM | POA: Diagnosis not present

## 2020-09-29 MED ORDER — MESALAMINE 400 MG PO CPDR
800.0000 mg | DELAYED_RELEASE_CAPSULE | Freq: Two times a day (BID) | ORAL | 3 refills | Status: DC
Start: 2020-09-29 — End: 2021-11-05

## 2020-09-29 NOTE — Patient Instructions (Addendum)
Start probiotic for a few weeks after completing antibiotics (amoxicillin) - [facility] or Align are good ones.  Continue mesalamine as discussed.   [facility] - # [phone]

## 2020-09-29 NOTE — Progress Notes (Signed)
Patient profile: Justin [name] is a 64 y.o. male seen for follow up. Last seen 09/2019 by Dr Laural Golden for hx of pan UC dx 2018 on colonoscopy done for diarrhea and wt loss. Initially started on prednisone and oral mesalamine and did well when decreasing steroids. Has been on mesalamine since that time.   History of Present Illness: Justin [name] is seen today for follow-up.  He reports overall doing well from a GI standpoint.  He is having 1 bowel movement a day.  He denies any abdominal pain or blood in stool.  His appetite was poor when he had Covid in August 2021 but has improved and is normal now.  He has regained the weight that he lost at that time.   He denies any upper GI symptoms of GERD, nausea, vomiting, dysphagia.  No epigastric pain. Wife accompanies and helps with history.  Takes aspirin 325 mg on stomach.  Denies any other NSAID use.    Wt Readings from Last 3 Encounters:  09/29/20 204 lb (92.5 kg)  09/28/20 203 lb (92.1 kg)  09/25/20 208 lb (94.3 kg)     Last Colonoscopy: 06/2017 - - The examined portion of the ileum was normal. - Endoscopic appearance consistent with pancolitis ulcerative colitis. Inflammatory changes more pronounced in proximal colon. This was moderate in severity. Biopsied. - Diverticulosis in the sigmoid colon. - Internal hemorrhoids. - Stool specimen obtained for O and P.  1. Colon, biopsy, right - CHRONIC MODERATELY ACTIVE COLITIS - REACTIVE ATYPIA - NO DYSPLASIA OR MALIGNANCY IDENTIFIED 2. Colon, biopsy, left - CHRONIC MILDLY ACTIVE COLITIS - NO DYSPLASIA OR MALIGNANCY IDENTIFIED 3. Rectum, biopsy - CHRONIC COLITIS - NO DYSPLASIA OR MALIGNANCY IDENTIFIED    Last Endoscopy: none on file   Past Medical History:  Past Medical History:  Diagnosis Date  . Anxiety   . Diarrhea   . Hypertension   . OSA (obstructive sleep apnea)    cpap 9 cm H2O  . Stroke [facility])     Problem List: Patient Active Problem List   Diagnosis Date Noted  .  Ulcerative colitis ([place]) 09/24/2019  . Incarcerated right inguinal hernia 01/30/2019  . SBO (small bowel obstruction) ([place])   . Left sided lacunar stroke ([place]) 05/24/2018  . Cerebral embolism with cerebral infarction 04/15/2018  . Right facial numbness 04/13/2018  . Recurrent unilateral inguinal hernia with obstruction and without gangrene   . Obstructive sleep apnea treated with continuous positive airway pressure (CPAP) 03/13/2018  . Hypersomnia with sleep apnea 12/15/2017  . Diarrhea 05/08/2017  . Chest pain 05/19/2015  . Anxiety 05/19/2015  . Hyperglycemia 05/19/2015  . Hypertension     Past Surgical History: Past Surgical History:  Procedure Laterality Date  . BIOPSY  06/21/2017   Procedure: BIOPSY;  Surgeon: Rogene Houston, MD;  [facility];  Service: Endoscopy;;  rectal, righta and left colon;  . COLONOSCOPY N/A 06/21/2017   Procedure: COLONOSCOPY;  Surgeon: Rogene Houston, MD;  Location: [facility];  Service: Endoscopy;  Laterality: N/A;  1:25  . INGUINAL HERNIA REPAIR Right 04/04/2018   Procedure: HERNIA REPAIR INGUINAL ADULT WITH MESH;  Surgeon: Aviva Signs, MD;  Location: [facility] ORS;  Service: General;  Laterality: Right;  . INGUINAL HERNIA REPAIR Right 01/31/2019   Procedure: RECURRENT RIGHT INGUINAL HERNIORRHAPY WITH MESH, INCARCERATED;  Surgeon: Aviva Signs, MD;  Location: [facility] ORS;  Service: General;  Laterality: Right;  . KNEE SURGERY     tore mcl in high  school (70's)    Allergies: No Known Allergies    Home Medications:  Current Outpatient Medications:  .  amLODipine (NORVASC) 10 MG tablet, Take 1 tablet (10 mg total) by mouth daily., Disp: 90 tablet, Rfl: 3 .  amoxicillin (AMOXIL) 500 MG capsule, Take 2 capsules (1,000 mg total) by mouth 3 (three) times daily for 10 days., Disp: 60 capsule, Rfl: 0 .  Ascorbic Acid (VITAMIN C) 100 MG tablet, Take 100 mg by mouth daily. OTC, Disp: , Rfl:  .  aspirin 325 MG tablet, Take 1 tablet (325 mg total) by  mouth daily., Disp: 30 tablet, Rfl: 0 .  atorvastatin (LIPITOR) 40 MG tablet, TAKE ONE TABLET BY MOUTH AT 6PM, Disp: 90 tablet, Rfl: 3 .  diphenhydrAMINE (BENADRYL) 25 MG tablet, Take 25 mg by mouth at bedtime as needed. , Disp: , Rfl:  .  escitalopram (LEXAPRO) 20 MG tablet, Take 1 tablet (20 mg total) by mouth at bedtime., Disp: 90 tablet, Rfl: 3 .  losartan (COZAAR) 100 MG tablet, TAKE ONE (1) TABLET BY MOUTH EVERY DAY, Disp: 90 tablet, Rfl: 3 .  Mesalamine (DELZICOL) 400 MG CPDR DR capsule, Take 2 capsules (800 mg total) by mouth 2 (two) times daily., Disp: 360 capsule, Rfl: 3 .  metoprolol succinate (TOPROL-XL) 25 MG 24 hr tablet, TAKE ONE TABLET (25MG TOTAL) BY MOUTH DAILY, Disp: 90 tablet, Rfl: 3 .  Calcium Carbonate Antacid (CALCIUM CARBONATE PO), Take by mouth. Unknown dose (Patient not taking: Reported on 09/29/2020), Disp: , Rfl:    Family History: family history includes Bradycardia in his brother; Cancer in his paternal uncle; Heart disease in his father; Mental illness in his paternal grandfather; Stroke in his brother and mother; Tuberculosis in his mother.  Denies known family history of colon polyps or colon cancer. States mother had colostomy for "lining of colon gave out"  Social History:   reports that he has never smoked. He has never used smokeless tobacco. He reports that he does not drink alcohol and does not use drugs.   Review of Systems: Constitutional: Denies weight loss/weight gain  Eyes: No changes in vision. ENT: No oral lesions, sore throat.  GI: see HPI.  Heme/Lymph: No easy bruising.  CV: No chest pain.  GU: No hematuria.  Integumentary: No rashes.  Neuro: No headaches.  Psych: No depression/anxiety.  Endocrine: No heat/cold intolerance.  Allergic/Immunologic: No urticaria.  Resp: No cough, SOB.  Musculoskeletal: No joint swelling.    Physical Examination: BP 118/66 (BP Location: Right Arm, Patient Position: Sitting, Cuff Size: Large)   Pulse (!) 53    Temp 97.6 F (36.4 C) (Oral)   Ht 6' 1"  (1.854 m)   Wt 204 lb (92.5 kg)   BMI 26.91 kg/m  Gen: NAD, alert and oriented x 4 HEENT: PEERLA, EOMI, Neck: supple, no JVD Chest: CTA bilaterally, no wheezes, crackles, or other adventitious sounds CV: RRR, no m/g/c/r Abd: soft, NT, ND, +BS in all four quadrants; no HSM, guarding, ridigity, or rebound tenderness Ext: no edema, well perfused with 2+ pulses, Skin: no rash or lesions noted on observed skin Lymph: no noted LAD  Data Reviewed:  pcp checks labs yearly - reports these to be normal.    09/20/20-IMPRESSION: There is mild diffuse interstitial prominence. Findings may reflect underlying pulmonary edema versus atypical infection. *Has f/up CXR scheduled    Assessment/Plan: Mr. Mciver is a 64 y.o. male seen for follow-up of ulcerative colitis  1.  UC-clinically in remission on oral mesalamine.  Insurance coverage has been issue and has decreased from 4 tablets/day to 3 tablets/day but has not had any return of symptoms. He is currently on a course of amoxicillin TID for abnormal chest x-ray and congestion as above, recommended course of probiotics following completion of antibiotics.   Clinically he is doing well-he will follow up in 1 year or call with any symptoms sooner if needed   Quentavious was seen today for one year follow up.  Diagnoses and all orders for this visit:  Ulcerative pancolitis without complication ([place])  Other orders -     Mesalamine (DELZICOL) 400 MG CPDR DR capsule; Take 2 capsules (800 mg total) by mouth 2 (two) times daily.      I personally performed the service, non-incident to. (WP)  Laurine Blazer, [facility]

## 2020-09-29 NOTE — Progress Notes (Signed)
I agree with the above plan 

## 2020-10-30 ENCOUNTER — Ambulatory Visit (INDEPENDENT_AMBULATORY_CARE_PROVIDER_SITE_OTHER): Payer: Medicare Other

## 2020-10-30 ENCOUNTER — Other Ambulatory Visit: Payer: Self-pay

## 2020-10-30 ENCOUNTER — Ambulatory Visit
Admission: EM | Admit: 2020-10-30 | Discharge: 2020-10-30 | Disposition: A | Discharge status: Home / Self Care | Payer: Medicare Other | Attending: Family Medicine | Admitting: Family Medicine

## 2020-10-30 ENCOUNTER — Encounter: Payer: Self-pay | Admitting: Emergency Medicine

## 2020-10-30 DIAGNOSIS — J069 Acute upper respiratory infection, unspecified: Secondary | ICD-10-CM

## 2020-10-30 DIAGNOSIS — R519 Headache, unspecified: Secondary | ICD-10-CM | POA: Diagnosis not present

## 2020-10-30 DIAGNOSIS — R509 Fever, unspecified: Secondary | ICD-10-CM | POA: Diagnosis not present

## 2020-10-30 DIAGNOSIS — J0141 Acute recurrent pansinusitis: Secondary | ICD-10-CM

## 2020-10-30 DIAGNOSIS — R059 Cough, unspecified: Secondary | ICD-10-CM | POA: Diagnosis not present

## 2020-10-30 DIAGNOSIS — R0981 Nasal congestion: Secondary | ICD-10-CM | POA: Diagnosis not present

## 2020-10-30 MED ORDER — DOXYCYCLINE HYCLATE 100 MG PO CAPS: 100.0000 mg | ORAL_CAPSULE | Freq: Two times a day (BID) | ORAL | 0 refills | Status: DC

## 2020-10-30 MED ORDER — BENZONATATE 100 MG PO CAPS: 100.0000 mg | ORAL_CAPSULE | Freq: Three times a day (TID) | ORAL | 0 refills | Status: DC

## 2020-10-30 NOTE — Discharge Instructions (Addendum)
Chest xray is stable. No change from the last xray  I have sent in doxycycline for you to take twice a day for 10 days.  Follow up with this office or with primary care if symptoms are persisting.  Follow up in the ER for high fever, trouble swallowing, trouble breathing, other concerning symptoms.

## 2020-10-30 NOTE — ED Triage Notes (Signed)
Cough and nasal congestion x 2 weeks. Has finished course of abx from pcp.

## 2020-11-02 NOTE — ED Provider Notes (Signed)
[facility]    CSN: [id number] Arrival date & time: 10/30/20  0900      History   Chief Complaint Chief Complaint  Patient presents with   Cough    HPI Justin [name] is a 64 y.o. male.   Reports that he has had a cough for over a month. Reports that he was diagnosed with atypical pneumonia in this office on 09/20/20. Was treated with amoxicillin, azithromycin and tessalon perles. Reports that he seemed to be getting better since then, but that he is now feeling a bit worse. Reports chills, increased fatigue, fever off and on and productive cough with dark brown sputum. Denies nausea, vomiting, diarrhea, rash, other symptoms. Reports that he had Covid in August of 2021. Has not completed covid vaccines.   ROS per HPI  The history is provided by the patient.  Cough   Past Medical History:  Diagnosis Date   Anxiety    Diarrhea    Hypertension    OSA (obstructive sleep apnea)    cpap 9 cm H2O   Stroke [facility])     Patient Active Problem List   Diagnosis Date Noted   Ulcerative colitis ([place]) 09/24/2019   Incarcerated right inguinal hernia 01/30/2019   SBO (small bowel obstruction) ([facility])    Left sided lacunar stroke ([place]) 05/24/2018   Cerebral embolism with cerebral infarction 04/15/2018   Right facial numbness 04/13/2018   Recurrent unilateral inguinal hernia with obstruction and without gangrene    Obstructive sleep apnea treated with continuous positive airway pressure (CPAP) 03/13/2018   Hypersomnia with sleep apnea 12/15/2017   Diarrhea 05/08/2017   Chest pain 05/19/2015   Anxiety 05/19/2015   Hyperglycemia 05/19/2015   Hypertension     Past Surgical History:  Procedure Laterality Date   BIOPSY  06/21/2017   Procedure: BIOPSY;  Surgeon: Rogene Houston, MD;  [facility];  Service: Endoscopy;;  rectal, righta and left colon;   COLONOSCOPY N/A 06/21/2017   Procedure: COLONOSCOPY;  Surgeon: Rogene Houston, MD;   [facility];  Service: Endoscopy;  Laterality: N/A;  1:25   INGUINAL HERNIA REPAIR Right 04/04/2018   Procedure: HERNIA REPAIR INGUINAL ADULT WITH MESH;  Surgeon: Aviva Signs, MD;  Location: [facility];  Service: General;  Laterality: Right;   INGUINAL HERNIA REPAIR Right 01/31/2019   Procedure: RECURRENT RIGHT INGUINAL HERNIORRHAPY WITH MESH, INCARCERATED;  Surgeon: Aviva Signs, MD;  Location: [facility];  Service: General;  Laterality: Right;   KNEE SURGERY     tore mcl in high school (70's)       Home Medications    Prior to Admission medications   Medication Sig Start Date End Date Taking? Authorizing Provider  amLODipine (NORVASC) 10 MG tablet Take 1 tablet (10 mg total) by mouth daily. 11/21/19   [name], MD  Ascorbic Acid (VITAMIN C) 100 MG tablet Take 100 mg by mouth daily. OTC    [provider]  aspirin 325 MG tablet Take 1 tablet (325 mg total) by mouth daily. 04/16/18   Hosie Poisson, MD  atorvastatin (LIPITOR) 40 MG tablet TAKE ONE TABLET BY MOUTH AT 6PM 06/02/20   [name], MD  benzonatate (TESSALON) 100 MG capsule Take 1 capsule (100 mg total) by mouth every 8 (eight) hours. 10/30/20   Faustino Congress, NP  Calcium Carbonate Antacid (CALCIUM CARBONATE PO) Take by mouth. Unknown dose Patient not taking: Reported on 09/29/2020    [provider]  diphenhydrAMINE (  BENADRYL) 25 MG tablet Take 25 mg by mouth at bedtime as needed.     [provider]  doxycycline (VIBRAMYCIN) 100 MG capsule Take 1 capsule (100 mg total) by mouth 2 (two) times daily. 10/30/20   Faustino Congress, NP  escitalopram (LEXAPRO) 20 MG tablet Take 1 tablet (20 mg total) by mouth at bedtime. 05/18/20   Frann Rider, NP  losartan (COZAAR) 100 MG tablet TAKE ONE (1) TABLET BY MOUTH EVERY DAY 06/10/20   [name], MD  Mesalamine (DELZICOL) 400 MG CPDR DR capsule Take 2 capsules (800 mg total) by mouth 2 (two) times daily. 09/29/20   Ezzard Standing, PA-C  metoprolol succinate (TOPROL-XL) 25 MG 24 hr tablet TAKE ONE TABLET (25MG TOTAL) BY MOUTH DAILY 09/08/20   [name], MD    Family History Family History  Problem Relation Age of Onset   Tuberculosis Mother    Stroke Mother    Heart disease Father        died at 15   Bradycardia Brother        pacemaker   Stroke Brother    Cancer Paternal Uncle    Mental illness Paternal Grandfather        suicide at 1   Colon cancer Neg Hx     Social History Social History   Tobacco Use   Smoking status: Never Smoker   Smokeless tobacco: Never Used  Scientific laboratory technician Use: Never used  Substance Use Topics   Alcohol use: No    Alcohol/week: 0.0 standard drinks   Drug use: No     Allergies   Patient has no known allergies.   Review of Systems Review of Systems  Respiratory: Positive for cough.      Physical Exam Triage Vital Signs ED Triage Vitals  Enc Vitals Group     BP 10/30/20 0944 123/80     Pulse Rate 10/30/20 0944 68     Resp 10/30/20 0944 17     Temp 10/30/20 0944 98.1 F (36.7 C)     Temp Source 10/30/20 0944 Oral     SpO2 10/30/20 0944 95 %     Weight 10/30/20 0943 202 lb 13.2 oz (92 kg)     Height 10/30/20 0943 6' 1"  (1.854 m)     Head Circumference --      Peak Flow --      Pain Score 10/30/20 0943 0     Pain Loc --      Pain Edu? --      Excl. in [place]? --    No data found.  Updated Vital Signs BP 123/80 (BP Location: Right Arm)    Pulse 68    Temp 98.1 F (36.7 C) (Oral)    Resp 17    Ht 6' 1"  (1.854 m)    Wt 202 lb 13.2 oz (92 kg)    SpO2 95%    BMI 26.76 kg/m   Visual Acuity Right Eye Distance:   Left Eye Distance:   Bilateral Distance:    Right Eye Near:   Left Eye Near:    Bilateral Near:     Physical Exam Vitals and nursing note reviewed.  Constitutional:      General: He is not in acute distress.    Appearance: He is well-developed and well-nourished. He is ill-appearing.  HENT:     Head:  Normocephalic and atraumatic.     Right Ear: Tympanic membrane normal.  Left Ear: Tympanic membrane normal.     Nose: Congestion present.     Mouth/Throat:     Mouth: Mucous membranes are moist.     Pharynx: Oropharynx is clear. Posterior oropharyngeal erythema present.  Eyes:     Extraocular Movements: Extraocular movements intact.     Conjunctiva/sclera: Conjunctivae normal.     Pupils: Pupils are equal, round, and reactive to light.  Cardiovascular:     Rate and Rhythm: Normal rate and regular rhythm.     Heart sounds: Normal heart sounds. No murmur heard.   Pulmonary:     Effort: Pulmonary effort is normal. No respiratory distress.     Breath sounds: Wheezing and rhonchi present.  Abdominal:     General: Bowel sounds are normal. There is no distension.     Palpations: Abdomen is soft.     Tenderness: There is no abdominal tenderness.  Musculoskeletal:        General: No edema. Normal range of motion.     Cervical back: Normal range of motion and neck supple.  Lymphadenopathy:     Cervical: Cervical adenopathy present.  Skin:    General: Skin is warm and dry.     Capillary Refill: Capillary refill takes less than 2 seconds.  Neurological:     General: No focal deficit present.     Mental Status: He is alert and oriented to person, place, and time.  Psychiatric:        Mood and Affect: Mood and affect and mood normal.        Behavior: Behavior normal.        Thought Content: Thought content normal.      UC Treatments / Results  Labs (all labs ordered are listed, but only abnormal results are displayed) Labs Reviewed - No data to display  EKG   Radiology No results found.  Procedures Procedures (including critical care time)  Medications Ordered in UC Medications - No data to display  Initial Impression / Assessment and Plan / UC Course  I have reviewed the triage vital signs and the nursing notes.  Pertinent labs & imaging results that were available  during my care of the patient were reviewed by me and considered in my medical decision making (see chart for details).     Acute pansinusitis URI Cough Congestion Headache  Prescribed doxycycline BID x 10 days Refilled tessalon perles Atypical pneumonia is in the differential and will treat as such Chest xray is stable and unchanged from last xray on 09/20/20, showing mild diffuse interstitial prominence and possible atypical infection Declines Covid and flu testing today Follow up with this office or with PCP if symptoms are persisting Follow up in the ER for acute worsening symptoms, high fever, trouble swallowing, trouble breathing, other concerning symptoms Verbalized understanding and is in agreement with treatment plan  Final Clinical Impressions(s) / UC Diagnoses   Final diagnoses:  Cough  Nasal congestion  Nonintractable headache, unspecified chronicity pattern, unspecified headache type  Acute recurrent pansinusitis  Acute upper respiratory infection     Discharge Instructions     Chest xray is stable. No change from the last xray  I have sent in doxycycline for you to take twice a day for 10 days.  Follow up with this office or with primary care if symptoms are persisting.  Follow up in the ER for high fever, trouble swallowing, trouble breathing, other concerning symptoms.     ED Prescriptions    Medication Sig Dispense Auth.  Provider   doxycycline (VIBRAMYCIN) 100 MG capsule Take 1 capsule (100 mg total) by mouth 2 (two) times daily. 20 capsule Faustino Congress, NP   benzonatate (TESSALON) 100 MG capsule Take 1 capsule (100 mg total) by mouth every 8 (eight) hours. 21 capsule Faustino Congress, NP     PDMP not reviewed this encounter.   Faustino Congress, NP 11/02/20 [phone]

## 2020-11-19 ENCOUNTER — Other Ambulatory Visit: Payer: Self-pay | Admitting: Family Medicine

## 2020-12-02 ENCOUNTER — Ambulatory Visit
Admission: EM | Admit: 2020-12-02 | Discharge: 2020-12-02 | Disposition: A | Discharge status: Home / Self Care | Payer: Medicare Other | Attending: Family Medicine | Admitting: Family Medicine

## 2020-12-02 ENCOUNTER — Encounter: Payer: Self-pay | Admitting: Emergency Medicine

## 2020-12-02 ENCOUNTER — Other Ambulatory Visit: Payer: Self-pay

## 2020-12-02 ENCOUNTER — Ambulatory Visit (INDEPENDENT_AMBULATORY_CARE_PROVIDER_SITE_OTHER): Payer: Medicare Other

## 2020-12-02 ENCOUNTER — Ambulatory Visit: Admission: EM | Admit: 2020-12-02 | Discharge status: Absent without leave | Payer: Self-pay | Source: Home / Self Care

## 2020-12-02 DIAGNOSIS — R059 Cough, unspecified: Secondary | ICD-10-CM

## 2020-12-02 DIAGNOSIS — J0141 Acute recurrent pansinusitis: Secondary | ICD-10-CM

## 2020-12-02 DIAGNOSIS — Z8701 Personal history of pneumonia (recurrent): Secondary | ICD-10-CM

## 2020-12-02 MED ORDER — AMOXICILLIN-POT CLAVULANATE 875-125 MG PO TABS: 1.0000 | ORAL_TABLET | Freq: Two times a day (BID) | ORAL | 0 refills | Status: AC

## 2020-12-02 MED ORDER — BENZONATATE 100 MG PO CAPS: 100.0000 mg | ORAL_CAPSULE | Freq: Three times a day (TID) | ORAL | 0 refills | Status: DC

## 2020-12-02 NOTE — ED Provider Notes (Addendum)
[place]   [id number] 12/02/20 Arrival Time: 0623   CC: COVID symptoms  SUBJECTIVE: History from: patient.  Justin [name] is a 65 y.o. male who presents with sinus congestion that started a week ago.  Also reports that he has had a cough since November 2021 when he was diagnosed with atypical pneumonia.  He was seen in this office on 10/30/2020 for follow-up chest x-ray and was treated with benzonatate and doxycycline. Denies recent travel. Has history of Covid in August 2021. Has not completed Covid vaccines.  Reports postnasal drip that is sending him into coughing fits.  Cough and fatigue are worsened with activity.  Denies fever, chills, rhinorrhea, sore throat, SOB, wheezing, chest pain, nausea, changes in bowel or bladder habits.    ROS: As per HPI.  All other pertinent ROS negative.     Past Medical History:  Diagnosis Date  . Anxiety   . Diarrhea   . Hypertension   . OSA (obstructive sleep apnea)    cpap 9 cm H2O  . Stroke [facility])    Past Surgical History:  Procedure Laterality Date  . BIOPSY  06/21/2017   Procedure: BIOPSY;  Surgeon: Rogene Houston, MD;  [facility];  Service: Endoscopy;;  rectal, righta and left colon;  . COLONOSCOPY N/A 06/21/2017   Procedure: COLONOSCOPY;  Surgeon: Rogene Houston, MD;  [facility];  Service: Endoscopy;  Laterality: N/A;  1:25  . INGUINAL HERNIA REPAIR Right 04/04/2018   Procedure: HERNIA REPAIR INGUINAL ADULT WITH MESH;  Surgeon: Aviva Signs, MD;  Location: [facility] ORS;  Service: General;  Laterality: Right;  . INGUINAL HERNIA REPAIR Right 01/31/2019   Procedure: RECURRENT RIGHT INGUINAL HERNIORRHAPY WITH MESH, INCARCERATED;  Surgeon: Aviva Signs, MD;  Location: [facility];  Service: General;  Laterality: Right;  . KNEE SURGERY     tore mcl in high school (70's)   No Known Allergies No current facility-administered medications on file prior to encounter.   Current Outpatient Medications on File Prior  to Encounter  Medication Sig Dispense Refill  . amLODipine (NORVASC) 10 MG tablet TAKE ONE TABLET (10MG TOTAL) BY MOUTH DAILY 90 tablet 3  . Ascorbic Acid (VITAMIN C) 100 MG tablet Take 100 mg by mouth daily. OTC    . aspirin 325 MG tablet Take 1 tablet (325 mg total) by mouth daily. 30 tablet 0  . atorvastatin (LIPITOR) 40 MG tablet TAKE ONE TABLET BY MOUTH AT 6PM 90 tablet 3  . Calcium Carbonate Antacid (CALCIUM CARBONATE PO) Take by mouth. Unknown dose (Patient not taking: Reported on 09/29/2020)    . diphenhydrAMINE (BENADRYL) 25 MG tablet Take 25 mg by mouth at bedtime as needed.     Marland Kitchen escitalopram (LEXAPRO) 20 MG tablet Take 1 tablet (20 mg total) by mouth at bedtime. 90 tablet 3  . losartan (COZAAR) 100 MG tablet TAKE ONE (1) TABLET BY MOUTH EVERY DAY 90 tablet 3  . Mesalamine (DELZICOL) 400 MG CPDR DR capsule Take 2 capsules (800 mg total) by mouth 2 (two) times daily. 360 capsule 3  . metoprolol succinate (TOPROL-XL) 25 MG 24 hr tablet TAKE ONE TABLET (25MG TOTAL) BY MOUTH DAILY 90 tablet 3   Social History   Socioeconomic History  . Marital status: Married    Spouse name: [name]  . Number of children: Not on file  . Years of education: Not on file  . Highest education level: Not on file  Occupational History  . Not on  file  Tobacco Use  . Smoking status: Never Smoker  . Smokeless tobacco: Never Used  Vaping Use  . Vaping Use: Never used  Substance and Sexual Activity  . Alcohol use: No    Alcohol/week: 0.0 standard drinks  . Drug use: No  . Sexual activity: Yes  Other Topics Concern  . Not on file  Social History Narrative   Lives with wife   Social Determinants of Health   Financial Resource Strain: Not on file  Food Insecurity: Not on file  Transportation Needs: Not on file  Physical Activity: Not on file  Stress: Not on file  Social Connections: Not on file  Intimate Partner Violence: Not on file   Family History  Problem Relation Age of Onset  .  Tuberculosis Mother   . Stroke Mother   . Heart disease Father        died at 45  . Bradycardia Brother        pacemaker  . Stroke Brother   . Cancer Paternal Uncle   . Mental illness Paternal Grandfather        suicide at 23  . Colon cancer Neg Hx     OBJECTIVE:  Vitals:   12/02/20 1132  BP: 118/79  Pulse: (!) 58  Resp: 18  Temp: 98.3 F (36.8 C)  TempSrc: Oral  SpO2: 98%     General appearance: alert; appears fatigued, but nontoxic; speaking in full sentences and tolerating own secretions HEENT: NCAT; Ears: EACs clear, TMs pearly gray; Eyes: PERRL.  EOM grossly intact. Sinuses: frontal and maxillary sinus tenderness; Nose: nares patent with clear rhinorrhea, Throat: oropharynx erythematous, cobblestoning present, tonsils non erythematous or enlarged, uvula midline  Neck: supple without LAD Lungs: unlabored respirations, symmetrical air entry; cough: moderate; no respiratory distress; coarse lung sounds to L lung Heart: regular rate and rhythm.  Radial pulses 2+ symmetrical bilaterally Skin: warm and dry Psychological: alert and cooperative; normal mood and affect  LABS:  No results found for this or any previous visit (from the past 24 hour(s)).   ASSESSMENT & PLAN:  1. Recurrent pansinusitis   2. Cough   3. History of pneumonia     Meds ordered this encounter  Medications  . amoxicillin-clavulanate (AUGMENTIN) 875-125 MG tablet    Sig: Take 1 tablet by mouth 2 (two) times daily for 7 days.    Dispense:  14 tablet    Refill:  0    Order Specific Question:   Supervising Provider    Answer:   Chase Picket [id number]  . benzonatate (TESSALON) 100 MG capsule    Sig: Take 1 capsule (100 mg total) by mouth every 8 (eight) hours.    Dispense:  21 capsule    Refill:  0    Order Specific Question:   Supervising Provider    Answer:   Chase Picket [id number]   Chest x-ray is negative today Pneumonia has cleared. Prescribed Augmentin for recurrent  pansinusitis Prescribed Tessalon Perles for cough Continue supportive care at home COVID and flu testing ordered.  It will take between 2-3 days for test results. Someone will contact you regarding abnormal results.   Work note provided Patient should remain in quarantine until they have received Covid results.  If negative you may resume normal activities (go back to work/school) while practicing hand hygiene, social distance, and mask wearing.  If positive, patient should remain in quarantine for at least 5 days from symptom onset AND greater than 72 hours  after symptoms resolution (absence of fever without the use of fever-reducing medication and improvement in respiratory symptoms), whichever is longer Get plenty of rest and push fluids Use OTC zyrtec for nasal congestion, runny nose, and/or sore throat Use OTC flonase for nasal congestion and runny nose Use medications daily for symptom relief Use OTC medications like ibuprofen or tylenol as needed fever or pain Call or go to the ED if you have any new or worsening symptoms such as fever, worsening cough, shortness of breath, chest tightness, chest pain, turning blue, changes in mental status.  Reviewed expectations re: course of current medical issues. Questions answered. Outlined signs and symptoms indicating need for more acute intervention. Patient verbalized understanding. After Visit Summary given.         Faustino Congress, NP 12/02/20 [place]    Faustino Congress, NP 12/02/20 1220

## 2020-12-02 NOTE — ED Triage Notes (Signed)
Sinus congestion that started x 1 week ago.  Also reports cough that he has had since November.

## 2020-12-02 NOTE — Discharge Instructions (Addendum)
Chest xray is clear today. No pneumonia seen.  I have sent in Augmentin for you to take twice a day for 7 days for sinus infection  Your COVID test is pending.  You should self quarantine until the test result is back.    Take Tylenol or ibuprofen as needed for fever or discomfort.  Rest and keep yourself hydrated.    I have sent in [place] for you to use one capsule every 8 hours as needed for cough.  Follow-up with your primary care provider if your symptoms are not improving.

## 2020-12-04 LAB — COVID-19, FLU A+B NAA
Influenza A, NAA: NOT DETECTED
Influenza B, NAA: NOT DETECTED
SARS-CoV-2, NAA: DETECTED — AB

## 2020-12-08 ENCOUNTER — Ambulatory Visit: Payer: Medicare Other | Admitting: Family Medicine

## 2020-12-21 ENCOUNTER — Encounter: Payer: Self-pay | Admitting: Family Medicine

## 2020-12-21 ENCOUNTER — Ambulatory Visit (INDEPENDENT_AMBULATORY_CARE_PROVIDER_SITE_OTHER): Payer: Medicare Other | Admitting: Family Medicine

## 2020-12-21 ENCOUNTER — Other Ambulatory Visit: Payer: Self-pay

## 2020-12-21 VITALS — BP 116/74 | HR 55 | Temp 97.8°F | Ht 73.0 in | Wt 206.0 lb

## 2020-12-21 DIAGNOSIS — I10 Essential (primary) hypertension: Secondary | ICD-10-CM | POA: Diagnosis not present

## 2020-12-21 DIAGNOSIS — I6930 Unspecified sequelae of cerebral infarction: Secondary | ICD-10-CM

## 2020-12-21 DIAGNOSIS — I6381 Other cerebral infarction due to occlusion or stenosis of small artery: Secondary | ICD-10-CM

## 2020-12-21 LAB — COMPLETE METABOLIC PANEL WITH GFR
AG Ratio: 1.7 (calc) (ref 1.0–2.5)
ALT: 23 U/L (ref 9–46)
AST: 23 U/L (ref 10–35)
Albumin: 4.4 g/dL (ref 3.6–5.1)
Alkaline phosphatase (APISO): 91 U/L (ref 35–144)
BUN: 17 mg/dL (ref 7–25)
CO2: 28 mmol/L (ref 20–32)
Calcium: 9.8 mg/dL (ref 8.6–10.3)
Chloride: 103 mmol/L (ref 98–110)
Creat: 0.99 mg/dL (ref 0.70–1.25)
GFR, Est African American: 92 mL/min/{1.73_m2} (ref >=60)
GFR, Est Non African American: 80 mL/min/{1.73_m2} (ref >=60)
Globulin: 2.6 g/dL (calc) (ref 1.9–3.7)
Glucose, Bld: 87 mg/dL (ref 65–99)
Potassium: 4.3 mmol/L (ref 3.5–5.3)
Sodium: 138 mmol/L (ref 135–146)
Total Bilirubin: 1.1 mg/dL (ref 0.2–1.2)
Total Protein: 7 g/dL (ref 6.1–8.1)

## 2020-12-21 LAB — LIPID PANEL
Cholesterol: 129 mg/dL (ref <200)
HDL: 46 mg/dL (ref >=40)
LDL Cholesterol (Calc): 68 mg/dL (calc)
Non-HDL Cholesterol (Calc): 83 mg/dL (calc) (ref <130)
Total CHOL/HDL Ratio: 2.8 (calc) (ref <5.0)
Triglycerides: 72 mg/dL (ref <150)

## 2020-12-21 LAB — CBC WITH DIFFERENTIAL/PLATELET
Absolute Monocytes: 1173 cells/uL — ABNORMAL HIGH (ref 200–950)
Basophils Absolute: 49 cells/uL (ref 0–200)
Basophils Relative: 0.6 %
Eosinophils Absolute: 156 cells/uL (ref 15–500)
Eosinophils Relative: 1.9 %
HCT: 44.6 % (ref 38.5–50.0)
Hemoglobin: 15.3 g/dL (ref 13.2–17.1)
Lymphs Abs: 1427 cells/uL (ref 850–3900)
MCH: 32 pg (ref 27.0–33.0)
MCHC: 34.3 g/dL (ref 32.0–36.0)
MCV: 93.3 fL (ref 80.0–100.0)
MPV: 9.5 fL (ref 7.5–12.5)
Monocytes Relative: 14.3 %
Neutro Abs: 5396 cells/uL (ref 1500–7800)
Neutrophils Relative %: 65.8 %
Platelets: 369 10*3/uL (ref 140–400)
RBC: 4.78 10*6/uL (ref 4.20–5.80)
RDW: 11.8 % (ref 11.0–15.0)
Total Lymphocyte: 17.4 %
WBC: 8.2 10*3/uL (ref 3.8–10.8)

## 2020-12-21 NOTE — Progress Notes (Signed)
Subjective:    Patient ID: Justin [name], male    DOB: 1956-04-22, 65 y.o.   MRN: [id number]  HPI 09/25/20 Last Saturday, the patient developed a fever to 101.  He felt achy all over.  He had a cough and chest congestion.  His wife took him to an urgent care where they obtained a chest x-ray that showed interstitial prominence throughout possibly concerning for an atypical infection.  They started him on Augmentin and a Z-Pak.  He is here today for follow-up.  He has not had a fever in several days.  His cough is about 50 or 60% better.  The cough is now nonproductive.  He still feels tired but he is definitely improving.  However he has been working all weekend as an Clinical biochemist and has not rested nor took it easy.  He denies any sore throat or head congestion or rhinorrhea or rash or nausea or vomiting or diarrhea.  At that time, my plan was: Physical exam today is totally normal.  I believe the patient likely had a virus or possibly walking pneumonia.  Regardless he seems to be doing much better.  Recommended rest and tincture of time.  Repeat chest x-ray in 3 weeks.  12/21/20 Patient recently had COVID again.  He had COVID in August 2021.  We suspect that he had a delta variant.  January 27, he went to a local urgent care due to cough and head congestion.  Chest x-ray there was negative for any pneumonia however he did test positive for COVID most likely the omicron variant.  He states that he is feeling better now.  He denies any fevers or chills or chest pain or shortness of breath.  His wife wanted him checked out just to make sure that he has not developed pneumonia as he does have some can to need coughing.  He states that he feels much better.  He denies any chest pain or pleurisy or hemoptysis.  He is still due to get his Covid vaccine.  I have recommended that he get 30 days after this initial infection.  He is also due for fasting lab work given his history of hyperlipidemia and history of  stroke. Past Medical History:  Diagnosis Date  . Anxiety   . Diarrhea   . Hypertension   . OSA (obstructive sleep apnea)    cpap 9 cm H2O  . Stroke [facility])    Past Surgical History:  Procedure Laterality Date  . BIOPSY  06/21/2017   Procedure: BIOPSY;  Surgeon: Rogene Houston, MD;  [facility];  Service: Endoscopy;;  rectal, righta and left colon;  . COLONOSCOPY N/A 06/21/2017   Procedure: COLONOSCOPY;  Surgeon: Rogene Houston, MD;  [facility];  Service: Endoscopy;  Laterality: N/A;  1:25  . INGUINAL HERNIA REPAIR Right 04/04/2018   Procedure: HERNIA REPAIR INGUINAL ADULT WITH MESH;  Surgeon: Aviva Signs, MD;  Location: [facility];  Service: General;  Laterality: Right;  . INGUINAL HERNIA REPAIR Right 01/31/2019   Procedure: RECURRENT RIGHT INGUINAL HERNIORRHAPY WITH MESH, INCARCERATED;  Surgeon: Aviva Signs, MD;  Location: [facility];  Service: General;  Laterality: Right;  . KNEE SURGERY     tore mcl in high school (70's)   Current Outpatient Medications on File Prior to Visit  Medication Sig Dispense Refill  . amLODipine (NORVASC) 10 MG tablet TAKE ONE TABLET (10MG TOTAL) BY MOUTH DAILY 90 tablet 3  . Ascorbic Acid (VITAMIN C) 100  MG tablet Take 100 mg by mouth daily. OTC    . aspirin 325 MG tablet Take 1 tablet (325 mg total) by mouth daily. 30 tablet 0  . atorvastatin (LIPITOR) 40 MG tablet TAKE ONE TABLET BY MOUTH AT 6PM 90 tablet 3  . Calcium Carbonate Antacid (CALCIUM CARBONATE PO) Take by mouth. Unknown dose (Patient not taking: Reported on 09/29/2020)    . diphenhydrAMINE (BENADRYL) 25 MG tablet Take 25 mg by mouth at bedtime as needed.     Marland Kitchen escitalopram (LEXAPRO) 20 MG tablet Take 1 tablet (20 mg total) by mouth at bedtime. 90 tablet 3  . losartan (COZAAR) 100 MG tablet TAKE ONE (1) TABLET BY MOUTH EVERY DAY 90 tablet 3  . Mesalamine (DELZICOL) 400 MG CPDR DR capsule Take 2 capsules (800 mg total) by mouth 2 (two) times daily. 360 capsule 3  .  metoprolol succinate (TOPROL-XL) 25 MG 24 hr tablet TAKE ONE TABLET (25MG TOTAL) BY MOUTH DAILY 90 tablet 3   No current facility-administered medications on file prior to visit.   No Known Allergies Social History   Socioeconomic History  . Marital status: Married    Spouse name: Justin [name]  . Number of children: Not on file  . Years of education: Not on file  . Highest education level: Not on file  Occupational History  . Not on file  Tobacco Use  . Smoking status: Never Smoker  . Smokeless tobacco: Never Used  Vaping Use  . Vaping Use: Never used  Substance and Sexual Activity  . Alcohol use: No    Alcohol/week: 0.0 standard drinks  . Drug use: No  . Sexual activity: Yes  Other Topics Concern  . Not on file  Social History Narrative   Lives with wife   Social Determinants of Health   Financial Resource Strain: Not on file  Food Insecurity: Not on file  Transportation Needs: Not on file  Physical Activity: Not on file  Stress: Not on file  Social Connections: Not on file  Intimate Partner Violence: Not on file    Review of Systems  All other systems reviewed and are negative.      Objective:   Physical Exam Vitals reviewed.  Constitutional:      Appearance: Normal appearance. He is normal weight. He is not ill-appearing or toxic-appearing.  HENT:     Right Ear: Tympanic membrane and ear canal normal.     Left Ear: Tympanic membrane and ear canal normal.     Nose: Nose normal. No congestion or rhinorrhea.     Mouth/Throat:     Mouth: Mucous membranes are moist.     Pharynx: No oropharyngeal exudate or posterior oropharyngeal erythema.  Eyes:     Conjunctiva/sclera: Conjunctivae normal.     Pupils: Pupils are equal, round, and reactive to light.  Cardiovascular:     Rate and Rhythm: Normal rate and regular rhythm.     Heart sounds: Normal heart sounds.  Pulmonary:     Effort: Pulmonary effort is normal. No respiratory distress.     Breath sounds: Normal  breath sounds. No stridor. No wheezing, rhonchi or rales.  Chest:     Chest wall: No tenderness.  Abdominal:     General: Abdomen is flat. Bowel sounds are normal.     Palpations: Abdomen is soft.  Musculoskeletal:     Right lower leg: No edema.     Left lower leg: No edema.  Skin:    Findings: No rash.  Neurological:     Mental Status: He is alert.         Assessment & Plan:  Cerebrovascular accident (CVA) due to occlusion of small artery ([place]) - Plan: Lipid panel, COMPLETE METABOLIC PANEL WITH GFR, CBC with Differential/Platelet  Benign essential HTN - Plan: Lipid panel, COMPLETE METABOLIC PANEL WITH GFR, CBC with Differential/Platelet  Pulmonary exam today is completely normal.  Lungs are clear to auscultation bilaterally.  I believe that he is fully recovered from Covid.  I did recommend that he get the Covid vaccine in 30 days.  While the patient is here I would like to get a CBC, CMP, fasting lipid panel.  I like his LDL cholesterol to be less than 70 given his history of stroke.  His blood pressure today is outstanding at [place].

## 2020-12-28 ENCOUNTER — Ambulatory Visit: Payer: BC Managed Care – PPO | Admitting: Neurology

## 2021-02-07 ENCOUNTER — Other Ambulatory Visit: Payer: Self-pay

## 2021-02-07 ENCOUNTER — Encounter: Payer: Self-pay | Admitting: Emergency Medicine

## 2021-02-07 ENCOUNTER — Ambulatory Visit
Admission: EM | Admit: 2021-02-07 | Discharge: 2021-02-07 | Disposition: A | Discharge status: Home / Self Care | Payer: Medicare Other | Attending: Family Medicine | Admitting: Family Medicine

## 2021-02-07 DIAGNOSIS — M1712 Unilateral primary osteoarthritis, left knee: Secondary | ICD-10-CM | POA: Diagnosis not present

## 2021-02-07 DIAGNOSIS — M25462 Effusion, left knee: Secondary | ICD-10-CM

## 2021-02-07 DIAGNOSIS — M25562 Pain in left knee: Secondary | ICD-10-CM

## 2021-02-07 MED ORDER — PREDNISONE 20 MG PO TABS: 40.0000 mg | ORAL_TABLET | Freq: Every day | ORAL | 0 refills | Status: DC

## 2021-02-07 MED ORDER — DEXAMETHASONE SODIUM PHOSPHATE 10 MG/ML IJ SOLN
10.0000 mg | Freq: Once | INTRAMUSCULAR | Status: AC
  Administered 2021-02-07: 10 mg via INTRAMUSCULAR

## 2021-02-07 NOTE — ED Provider Notes (Signed)
[facility]    CSN: [id number] Arrival date & time: 02/07/21  1033      History   Chief Complaint No chief complaint on file.   HPI Justin [name] is a 65 y.o. male.   HPI  Patient presents with left knee pain and swelling. Followed by [facility] for management arthritis of knee and subsequent work related knee injury. He was last seen by Orthopedics 2 days ago and had fluid drained from the left knee. Left knee has been swollen and painful since procedure. He has taken tylenol without significant relief of pain. Past Medical History:  Diagnosis Date  . Anxiety   . Diarrhea   . Hypertension   . OSA (obstructive sleep apnea)    cpap 9 cm H2O  . Stroke [facility])     Patient Active Problem List   Diagnosis Date Noted  . Ulcerative colitis ([place]) 09/24/2019  . Incarcerated right inguinal hernia 01/30/2019  . SBO (small bowel obstruction) ([place])   . Left sided lacunar stroke ([place]) 05/24/2018  . Cerebral embolism with cerebral infarction 04/15/2018  . Right facial numbness 04/13/2018  . Recurrent unilateral inguinal hernia with obstruction and without gangrene   . Obstructive sleep apnea treated with continuous positive airway pressure (CPAP) 03/13/2018  . Hypersomnia with sleep apnea 12/15/2017  . Diarrhea 05/08/2017  . Chest pain 05/19/2015  . Anxiety 05/19/2015  . Hyperglycemia 05/19/2015  . Hypertension     Past Surgical History:  Procedure Laterality Date  . BIOPSY  06/21/2017   Procedure: BIOPSY;  Surgeon: Rogene Houston, MD;  [facility];  Service: Endoscopy;;  rectal, righta and left colon;  . COLONOSCOPY N/A 06/21/2017   Procedure: COLONOSCOPY;  Surgeon: Rogene Houston, MD;  [facility];  Service: Endoscopy;  Laterality: N/A;  1:25  . INGUINAL HERNIA REPAIR Right 04/04/2018   Procedure: HERNIA REPAIR INGUINAL ADULT WITH MESH;  Surgeon: Aviva Signs, MD;  Location: [facility];  Service: General;  Laterality: Right;  . INGUINAL HERNIA  REPAIR Right 01/31/2019   Procedure: RECURRENT RIGHT INGUINAL HERNIORRHAPY WITH MESH, INCARCERATED;  Surgeon: Aviva Signs, MD;  Location: [facility];  Service: General;  Laterality: Right;  . KNEE SURGERY     tore mcl in high school (70's)       Home Medications    Prior to Admission medications   Medication Sig Start Date End Date Taking? Authorizing Provider  amLODipine (NORVASC) 10 MG tablet TAKE ONE TABLET (10MG TOTAL) BY MOUTH DAILY 11/20/20   [name], MD  Ascorbic Acid (VITAMIN C) 100 MG tablet Take 100 mg by mouth daily. OTC    [provider]  aspirin 325 MG tablet Take 1 tablet (325 mg total) by mouth daily. 04/16/18   Hosie Poisson, MD  atorvastatin (LIPITOR) 40 MG tablet TAKE ONE TABLET BY MOUTH AT 6PM 06/02/20   [name], MD  Calcium Carbonate Antacid (CALCIUM CARBONATE PO) Take by mouth. Unknown dose Patient not taking: Reported on 09/29/2020    [provider]  diphenhydrAMINE (BENADRYL) 25 MG tablet Take 25 mg by mouth at bedtime as needed.     [provider]  escitalopram (LEXAPRO) 20 MG tablet Take 1 tablet (20 mg total) by mouth at bedtime. 05/18/20   Frann Rider, NP  losartan (COZAAR) 100 MG tablet TAKE ONE (1) TABLET BY MOUTH EVERY DAY 06/10/20   [name], MD  Mesalamine (DELZICOL) 400 MG CPDR DR capsule Take 2 capsules (  800 mg total) by mouth 2 (two) times daily. 09/29/20   Laurine Blazer B, PA-C  metoprolol succinate (TOPROL-XL) 25 MG 24 hr tablet TAKE ONE TABLET (25MG TOTAL) BY MOUTH DAILY 09/08/20   [name], MD    Family History Family History  Problem Relation Age of Onset  . Tuberculosis Mother   . Stroke Mother   . Heart disease Father        died at 66  . Bradycardia Brother        pacemaker  . Stroke Brother   . Cancer Paternal Uncle   . Mental illness Paternal Grandfather        suicide at 30  . Colon cancer Neg Hx     Social History Social History   Tobacco Use  . Smoking status:  Never Smoker  . Smokeless tobacco: Never Used  Vaping Use  . Vaping Use: Never used  Substance Use Topics  . Alcohol use: No    Alcohol/week: 0.0 standard drinks  . Drug use: No     Allergies   Patient has no known allergies.   Review of Systems Review of Systems Pertinent negatives listed in HPI   Physical Exam Triage Vital Signs ED Triage Vitals  Enc Vitals Group     BP 02/07/21 1214 134/79     Pulse Rate 02/07/21 1214 (!) 56     Resp 02/07/21 1214 18     Temp 02/07/21 1214 98.6 F (37 C)     Temp Source 02/07/21 1214 Oral     SpO2 02/07/21 1214 96 %     Weight --      Height --      Head Circumference --      Peak Flow --      Pain Score 02/07/21 1216 5     Pain Loc --      Pain Edu? --      Excl. in [place]? --    No data found.  Updated Vital Signs BP 134/79 (BP Location: Right Arm)   Pulse (!) 56   Temp 98.6 F (37 C) (Oral)   Resp 18   SpO2 96%   Visual Acuity Right Eye Distance:   Left Eye Distance:   Bilateral Distance:    Right Eye Near:   Left Eye Near:    Bilateral Near:     Physical Exam Constitutional:      Appearance: Normal appearance.  Cardiovascular:     Rate and Rhythm: Normal rate and regular rhythm.  Pulmonary:     Effort: Pulmonary effort is normal.     Breath sounds: Normal breath sounds.  Musculoskeletal:       Legs:  Skin:    General: Skin is warm and dry.     Capillary Refill: Capillary refill takes less than 2 seconds.  Neurological:     General: No focal deficit present.     Mental Status: He is alert and oriented to person, place, and time.  Psychiatric:        Mood and Affect: Mood normal.        Behavior: Behavior normal.        Thought Content: Thought content normal.        Judgment: Judgment normal.    UC Treatments / Results  Labs (all labs ordered are listed, but only abnormal results are displayed) Labs Reviewed - No data to display  EKG   Radiology No results found.  Procedures Procedures  (including critical  care time)  Medications Ordered in UC Medications - No data to display  Initial Impression / Assessment and Plan / UC Course  I have reviewed the triage vital signs and the nursing notes.  Pertinent labs & imaging results that were available during my care of the patient were reviewed by me and considered in my medical decision making (see chart for details).     Left knee swelling and pain. Continue knee brace. Will trial prednisone 40 mg daily x 5 days. Follow-up with orthopedic provider for further management Final Clinical Impressions(s) / UC Diagnoses   Final diagnoses:  Primary osteoarthritis of left knee  Pain and swelling of left knee     Discharge Instructions     Continue wearing knee brace with weight  bearing.  You were given a steriod injection today, start the oral prednisone tomorrow and continue for 5 days. Follow-up with your orthopedic case worker tomorrow to try and schedule injection sooner.    ED Prescriptions    Medication Sig Dispense Auth. Provider   predniSONE (DELTASONE) 20 MG tablet Take 2 tablets (40 mg total) by mouth daily with breakfast. 10 tablet Scot Jun, FNP     PDMP not reviewed this encounter.   Scot Jun, [place] 02/10/21 [phone]

## 2021-02-07 NOTE — ED Triage Notes (Signed)
Left knee pain had fluid drawn off knee on Thursday.  Knee swollen wife states he has had a low grade fever.  Hard for patient to walk today.

## 2021-02-07 NOTE — Discharge Instructions (Addendum)
Continue wearing knee brace with weight  bearing.  You were given a steriod injection today, start the oral prednisone tomorrow and continue for 5 days. Follow-up with your orthopedic case worker tomorrow to try and schedule injection sooner.

## 2021-03-25 ENCOUNTER — Encounter: Payer: Self-pay | Admitting: Adult Health

## 2021-03-29 ENCOUNTER — Other Ambulatory Visit: Payer: Self-pay

## 2021-03-29 ENCOUNTER — Ambulatory Visit (INDEPENDENT_AMBULATORY_CARE_PROVIDER_SITE_OTHER): Payer: Medicare Other | Admitting: Adult Health

## 2021-03-29 ENCOUNTER — Encounter: Payer: Self-pay | Admitting: Adult Health

## 2021-03-29 ENCOUNTER — Telehealth: Payer: Self-pay

## 2021-03-29 ENCOUNTER — Ambulatory Visit
Admission: EM | Admit: 2021-03-29 | Discharge: 2021-03-29 | Disposition: A | Discharge status: Home / Self Care | Payer: Medicare Other | Attending: Family Medicine | Admitting: Family Medicine

## 2021-03-29 VITALS — BP 111/70 | HR 56 | Ht 72.0 in | Wt 204.0 lb

## 2021-03-29 DIAGNOSIS — G4733 Obstructive sleep apnea (adult) (pediatric): Secondary | ICD-10-CM

## 2021-03-29 DIAGNOSIS — I69319 Unspecified symptoms and signs involving cognitive functions following cerebral infarction: Secondary | ICD-10-CM

## 2021-03-29 DIAGNOSIS — H5789 Other specified disorders of eye and adnexa: Secondary | ICD-10-CM

## 2021-03-29 DIAGNOSIS — Z8673 Personal history of transient ischemic attack (TIA), and cerebral infarction without residual deficits: Secondary | ICD-10-CM

## 2021-03-29 MED ORDER — DEXAMETHASONE SODIUM PHOSPHATE 10 MG/ML IJ SOLN
10.0000 mg | Freq: Once | INTRAMUSCULAR | Status: AC
  Administered 2021-03-29: 10 mg via INTRAMUSCULAR

## 2021-03-29 MED ORDER — AMOXICILLIN-POT CLAVULANATE 875-125 MG PO TABS: 1.0000 | ORAL_TABLET | Freq: Two times a day (BID) | ORAL | 0 refills | Status: AC

## 2021-03-29 NOTE — Progress Notes (Signed)
Provider:  Larey [name], [name]  Primary Care Physician:  Justin Frizzle, MD  Reason for visit: Office follow-up   Chief Complaint  Patient presents with  . Follow-up    TR with wife (Justin [name]) Pt is well, stable on stroke standpoint. Thinks memory is about the same. Most nights he sleeps well, wife states he does sleep in recliner often.  Wife did send message to [place] prior to visit.     HPI:   Today, 03/29/2021, Justin [name] returns for 60-monthstroke and sleep apnea follow-up accompanied by his wife.  Wife sent MyChart message prior to todays visit with concerns regarding continued decline in his cognition especially with short-term memory and processing of information.  Remains on Lexapro for prior increased agitation and irritation.  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  This concerns his wife as she does not feel he should be still working in setting of his continued cognitive decline.  MMSE today 24/30 (prior 22/30).  Of note, he was diagnosed with COVID-19 12/03/2020 and prior COVID 19 06/2020. He did recover well from most recent case and does not believe any worsening of cognition denies residual deficits.  Compliant on aspirin and atorvastatin without associated side effects.  Blood pressure today 111/70.   CPAP compliance report from the past 30 days shows 30 out of 30 usage days but only 15 days greater than 4 hours with average usage 4 hours and 2 minutes.  Residual AHI 0.9.  Per wife, will sleep for a couple hours prior to going to bed in recliner then will sleep in bed for 2-4 hours then go back to his recliner and sleep additional time.  He questions possible use of inspire device  Plans on undergoing knee replacement - sees ortho Thursday to discuss further   No further concerns at this time   MMSE - Justin [name] 03/29/2021 09/28/2020 05/18/2020  Not completed:  - - -  Orientation to time 3 4 2   Orientation to Place 5 5 5   Registration 3 3 3   Attention/ Calculation 5 2 3   Recall 0 0 0  Language- name 2 objects 2 2 2   Language- repeat 1 1 1   Language- follow 3 step command 3 3 3   Language- read & follow direction 1 1 1   Write a sentence 1 1 1   Copy design 0 0 1  Copy design-comments - 6 animals -  Total score 24 22 22      History provided for reference purposes only Update 09/28/2020 Justin [name]: Justin [name] for 418-monthollow-up regarding history of stroke and cognitive decline.  Since prior visit, he was diagnosed with COVID 8/30 and wife has noticed slight worsening of cognition since that time but overall stable. MMSE today 22/30 (prior 22/30). MRI, EEG and lab work all unremarkable for reversible causes or abnormality. 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 Wife has been assisting with this. Does report occasional increased agitation or irritation but has been able to better manage this. Currently on lexapro 2026maily. Denies new or recurrent stroke/TIA symptoms remaining on aspirin 325 mg daily and atorvastatin 40 mg daily without side effects.  Blood  pressure today 110/68. History of OSA on CPAP with difficulty recently due to possibly walking pneumonia recently completing treatment. Review of compliance report from 08/29/2020 -09/27/2020 shows 29 out of 30 usage days for 97% compliance with only 19 days greater than 4 hours for 63% compliance. Average usage 4 hours and 18 minutes. Residual AHI 2.0 on set pressure 9 cm H2O and EPR level 3. He does report slowly increasing his tolerance as he had difficulty using towards the end of October and beginning of November. Continues to follow with DME company [employer] for any needed supplies. No further concerns.  Update  05/18/2020 Justin [name]: Justin [name] returns for follow-up accompanied by his wife. Prior concerns of short-term memory loss gradually worsening since his stroke in 05/2018.  Evidence of depression/anxiety and initiated Lexapro 10 mg daily with improvement.  He does continue to experience occasional agitation/irritability as well as occasional short-term memory deficit as well as difficulty in multitasking and focusing.  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  He also reports intermittent word finding difficulty which has been more noticeable per wife.  He does report increased fatigue taking naps after work as well as difficulty with insomnia.  Ongoing compliance with CPAP for OSA management -download report told to ensure optimal residual AHI which showed excellent compliance with residual AHI 0.9.  Continues on aspirin 325 mg daily and atorvastatin 40 mg daily for secondary stroke prevention.  Blood pressure today satisfactory 125/77.  No further concerns at this time.  Update 01/14/2020 Justin [name]: Justin [name] is a 65 year old [name] presenting today, 01/14/2020 for a follow up with his wife present. He was last seen on 09/26/2019 for worsening short-term memory loss, which he and his wife feel has improved. He was started on lexapro 68m due to concerns of underlying depression/anxiety affecting memory with improvement of underlying depression/anxiety.  Most recent MMSE 26/30 on 12/25/2019 with prior 23/30.  He was previously being followed in this office due to left thalamic stroke in 04/2018 with residual right hemisensory impairment. He has continued on aspirin and atorvastatin for secondary stroke prevention. Overall patient and wife feel like his depression, anxiety and memory impairment have improved.  Recently had follow-up with Justin [name] for sleep apnea and endorses ongoing compliance.  Continues on aspirin and atorvastatin for secondary stroke prevention. Blood pressure today satisfactory  125/79. No further concerns at this time.   Update 09/26/2019: Justin [name] who is being seen per patient/wife request due to worsening short-term memory loss.  He was previously being followed in this office due to left thalamic stroke in 04/2018 with residual right hemisensory impairment.  He has continued on aspirin and atorvastatin for secondary stroke prevention.  Recent lipid panel showed LDL 61.  Wife has been observing occasional short-term memory concerns over the past few months.  He has continued working and functioning without difficulty but he will occasionally forget certain conversations, triple check to ensure he is completed different tasks and forgetting certain dates.  He does endorse increased stress over the past few months at work where he has no disease last patient with his workers and increased irritability.  He is also been experiencing worsening insomnia and has been having greater difficulty tolerating CPAP mask.  He does have a prior history of anxiety.  He was previously treated on Xanax several years prior. MMSE 23/30 greatest in recall, repetition, and drawing.  GAD-7 score 4.  PHQ 9 score 5.  After  further discussing possible depression/anxiety causing concerns of memory, wife has noticed patient being more depressed recently.  Reviewing his medication list, he will take Benadryl occasionally as needed to help with sleep.  Interval history 08/23/2018: Patient returns today for stroke follow-up visit.  He did undergo 30-day cardiac monitor which did show episode of V. tach with heart rate 178 but negative for atrial fibrillation.  Upon initial evaluation in the ED, he reported numbness of the right face, right upper extremity and right lower extremity.  Patient does state that he continues to have right upper extremity numbness that is constant with a "stiff and tight feeling".  He has continued to work despite his continued numbness but does have intermittent  difficulties as he is right-hand dominant.  He also does have some memory concerns stating that he is not as "sharp" as he previously was and will ask the same questions or will forget certain directions.  He continues to take aspirin without side effects of bleeding or bruising.  Continues to take Lipitor without side effects myalgias.  Blood pressure today satisfactory 135/89.  He does continue to state compliance with CPAP but has been finding himself taking the mask off in the middle the night.  He does have a follow-up in this office for OSA management on 09/13/2018.  No further concerns at this time.  Denies new or worsening stroke/TIA symptoms.  05/24/2018 visit CD: Seen in STROKE follow up -Justin [name] is a 65 year old [name] patient whom I had initially seen earlier this year for evaluation of sleep apnea unfortunately the patient underwent a right-sided inguinal hernia repair for which she had to discontinue his full size aspirin 5 days prior.  Also aspirin was removed within 2 or 3 days post surgery he did suffer a stroke.  And his main symptom was right-sided numbness and clumsiness sensory loss of the right dominant hand.  I see him today after he has undergone a stroke work-up in the hospital which included a head CT without contrast on 7 June, an MRI MRA of head and neck, the findings were an acute lacunar infarct in the left lateral thalamus there was no hemorrhage, he had negative abnormalities he was negative for abnormalities in the vascular tree his neck MRA showed mild atherosclerosis at the origins of the left internal carotid artery and right subclavian artery but not significant stenosis and he had very mild for age nonspecific white matter signal changes these are commonly seen the small vessel disease there was no brain atrophy noted.  He had presented to the hospital ED with elevated blood pressures which may explain the lacunar appearance of a previous stroke.  Head CT was negative.   Onset of symptoms was on 13 April 2018 he was evaluated by Dr. Kerney Elbe neurologist on-call, who also mentioned that the patient has a history of obstructive sleep apnea and is currently treated at 9 cmH2O pressure.  The patient restarted on aspirin metabolic panels were normal his creatinine was 1.05 his BUN 24 which may indicate that he was slightly dehydrated, his CBC was normal but that the count was 9.6K hemoglobin 15.1 hematocrit 50.1 without any evidence of anemia normal platelet count 350 3K.  Cardiac enzymes were negative his total cholesterol was actually rather low at 133 ng but is good "" cholesterol HDL was only 30.      Review of Systems: Out of a complete 14 system review, the patient complains of only the following listed in HPI,  and all other reviewed systems are negative.    Social History   Socioeconomic History  . Marital status: Married    Spouse name: Justin [name]  . Number of children: Not on file  . Years of education: Not on file  . Highest education level: Not on file  Occupational History  . Not on file  Tobacco Use  . Smoking status: Never Smoker  . Smokeless tobacco: Never Used  Vaping Use  . Vaping Use: Never used  Substance and Sexual Activity  . Alcohol use: No    Alcohol/week: 0.0 standard drinks  . Drug use: No  . Sexual activity: Yes  Other Topics Concern  . Not on file  Social History Narrative   Lives with wife   Social Determinants of Health   Financial Resource Strain: Not on file  Food Insecurity: Not on file  Transportation Needs: Not on file  Physical Activity: Not on file  Stress: Not on file  Social Connections: Not on file  Intimate Partner Violence: Not on file    Family History  Problem Relation Age of Onset  . Tuberculosis Mother   . Stroke Mother   . Heart disease Father        died at 12  . Bradycardia Brother        pacemaker  . Stroke Brother   . Cancer Paternal Uncle   . Mental illness Paternal Grandfather         suicide at 86  . Colon cancer Neg Hx     Past Medical History:  Diagnosis Date  . Anxiety   . Diarrhea   . Hypertension   . OSA (obstructive sleep apnea)    cpap 9 cm H2O  . Stroke [facility])     Past Surgical History:  Procedure Laterality Date  . BIOPSY  06/21/2017   Procedure: BIOPSY;  Surgeon: Rogene Houston, MD;  Location: [facility] SU[facility];  Service: Endoscopy;;  rectal, righta and left colon;  . COLONOSCOPY N/A 06/21/2017   Procedure: COLONOSCOPY;  Surgeon: Rogene Houston, MD;  Location: [facility];  Service: Endoscopy;  Laterality: N/A;  1:25  . INGUINAL HERNIA REPAIR Right 04/04/2018   Procedure: HERNIA REPAIR INGUINAL ADULT WITH MESH;  Surgeon: Aviva Signs, MD;  Location: [facility] ORS;  Service: General;  Laterality: Right;  . INGUINAL HERNIA REPAIR Right 01/31/2019   Procedure: RECURRENT RIGHT INGUINAL HERNIORRHAPY WITH MESH, INCARCERATED;  Surgeon: Aviva Signs, MD;  Location: [facility] ORS;  Service: General;  Laterality: Right;  . KNEE SURGERY     tore mcl in high school (70's)    Current Outpatient Medications  Medication Sig Dispense Refill  . amLODipine (NORVASC) 10 MG tablet TAKE ONE TABLET (10MG TOTAL) BY MOUTH DAILY 90 tablet 3  . amoxicillin-clavulanate (AUGMENTIN) 875-125 MG tablet Take 1 tablet by mouth 2 (two) times daily for 7 days. 14 tablet 0  . Ascorbic Acid (VITAMIN C) 100 MG tablet Take 100 mg by mouth daily. OTC    . aspirin 325 MG tablet Take 1 tablet (325 mg total) by mouth daily. 30 tablet 0  . atorvastatin (LIPITOR) 40 MG tablet TAKE ONE TABLET BY MOUTH AT 6PM 90 tablet 3  . Calcium Carbonate Antacid (CALCIUM CARBONATE PO) Take by mouth. Unknown dose    . diphenhydrAMINE (BENADRYL) 25 MG tablet Take 25 mg by mouth at bedtime as needed.     Marland Kitchen escitalopram (LEXAPRO) 20 MG tablet Take 1 tablet (20 mg total) by mouth at bedtime. [place]  tablet 3  . losartan (COZAAR) 100 MG tablet TAKE ONE (1) TABLET BY MOUTH EVERY DAY 90 tablet 3  . Mesalamine (DELZICOL) 400 MG CPDR  DR capsule Take 2 capsules (800 mg total) by mouth 2 (two) times daily. 360 capsule 3  . metoprolol succinate (TOPROL-XL) 25 MG 24 hr tablet TAKE ONE TABLET (25MG TOTAL) BY MOUTH DAILY 90 tablet 3  . predniSONE (DELTASONE) 20 MG tablet Take 2 tablets (40 mg total) by mouth daily with breakfast. 10 tablet 0   No current facility-administered medications for this visit.    Allergies as of 03/29/2021  . (No Known Allergies)    Vitals: Today's Vitals   03/29/21 0949  BP: 111/70  Pulse: (!) 56  Weight: 204 lb (92.5 kg)  Height: 6' (1.829 m)   Body mass index is 27.67 kg/m.   Physical exam: General: well developed, well nourished, pleasant middle-age Caucasian [name], seated, in no evident distress Head: head normocephalic and atraumatic.   Neck: supple with no carotid or supraclavicular bruits Cardiovascular: regular rate and rhythm, no murmurs Musculoskeletal: no deformity Skin:  no rash/petichiae Vascular:  Normal pulses all extremities   Neurologic Exam Mental Status: Awake and fully alert.   Fluent speech and language.  Oriented to place and time. Recent memory impaired and remote memory intact. Attention span, concentration and fund of knowledge appropriate during visit. Mood and affect appropriate.  MMSE today 24/30 with deficits in recall and drawing Cranial Nerves:  Pupils equal, briskly reactive to light. Extraocular movements full without nystagmus. Visual fields full to confrontation. Hearing intact. Facial sensation intact. Face, tongue, palate moves normally and symmetrically.  Motor: Normal bulk and tone. Normal strength in all tested extremity muscles. Sensory.: intact to touch , pinprick , position and vibratory sensation.  Coordination: Rapid alternating movements normal in all extremities. Finger-to-nose and heel-to-shin performed accurately bilaterally. Gait and Station: Arises from chair without difficulty. Stance is normal. Gait demonstrates normal stride length  and balance without use of assistive device Reflexes: 1+ and symmetric. Toes downgoing.       Assessment/plan: Justin [name] is a 65 year old [name] who is being seen today for follow-up regarding short-term memory loss complaints likely secondary to prior stroke, anxiety and depression.  History of left thalamic infarct in 04/14/2018 secondary to small vessel disease, HTN, HLD and OSA.      1. 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 He is not interested in initiating any medications such as Namenda or Aricept at this time -MRI, EEG and lab work all negative for reversible causes  2. History of stroke -Continue aspirin 325 mg daily and atorvastatin 40 mg daily for secondary stroke prevention -Discussed secondary stroke prevention measures and importance of close follow-up with PCP for HTN and HLD management  -Recent lipid panel showed LDL 68  3.  Obstructive sleep apnea -Recent compliance report shows 30 out of 30 usage days but only 15 days greater than 4 hours residual AHI 0.9 -Due to difficulty tolerating and limited medically usage, referral placed to ENT for evaluation of hypoglossal nerve stimulator.  Advised to continue to use CPAP and to use throughout the entire duration of sleeping until evaluation with  ENT and possible placement of hypoglossal nerve stimulator    Follow-up in 6 months or call earlier if needed   CC:  [employer] provider: Dr. Elissa Hefty, [name], MD    Frann Rider, [facility]  [facility] [address] [place], [place]  [address]  Phone [phone] Fax [phone] Note: This document was prepared with digital dictation and possible smart phrase technology. Any transcriptional errors that result from this process are unintentional.

## 2021-03-29 NOTE — Patient Instructions (Signed)
Referral placed to ENT for evaluation of Inspire Device - continue to use your CPAP nightly  Referral placed to neuropsychology for neurocognitive evaluation  Continue aspirin 81 mg daily  and atorvastatin  for secondary stroke prevention  Continue to follow up with PCP regarding cholesterol and blood pressure management  Maintain strict control of hypertension with blood pressure goal below 130/90 and cholesterol with LDL cholesterol (bad cholesterol) goal below 70 mg/dL.      Followup in the future with me in 6 months or call earlier if needed     Thank you for coming to see [place] at [facility]. I hope we have been able to provide you high quality care today.  You may receive a patient satisfaction survey over the next few weeks. We would appreciate your feedback and comments so that we may continue to improve ourselves and the health of our patients.

## 2021-03-29 NOTE — ED Triage Notes (Signed)
Pt woke up with swollen lids of lower and upper lids this morning, denies injury or pain , no vision changes

## 2021-03-29 NOTE — ED Provider Notes (Signed)
[facility]    CSN: [id number] Arrival date & time: 03/29/21  3329      History   Chief Complaint No chief complaint on file.   HPI Justin [name] is a 65 y.o. male.   Reports that he woke up with his left eyelids and above the left eyebrow swollen this morning. Has not attempted OTC treatment. Denies injury, redness to the eye, foreign body sensation, blurred vision, rash, pain, other symptoms.  ROS per HPI  The history is provided by the patient and the spouse.    Past Medical History:  Diagnosis Date  . Anxiety   . Diarrhea   . Hypertension   . OSA (obstructive sleep apnea)    cpap 9 cm H2O  . Stroke [facility])     Patient Active Problem List   Diagnosis Date Noted  . Ulcerative colitis ([place]) 09/24/2019  . Incarcerated right inguinal hernia 01/30/2019  . SBO (small bowel obstruction) ([place])   . Left sided lacunar stroke ([place]) 05/24/2018  . Cerebral embolism with cerebral infarction 04/15/2018  . Right facial numbness 04/13/2018  . Recurrent unilateral inguinal hernia with obstruction and without gangrene   . Obstructive sleep apnea treated with continuous positive airway pressure (CPAP) 03/13/2018  . Hypersomnia with sleep apnea 12/15/2017  . Diarrhea 05/08/2017  . Chest pain 05/19/2015  . Anxiety 05/19/2015  . Hyperglycemia 05/19/2015  . Hypertension     Past Surgical History:  Procedure Laterality Date  . BIOPSY  06/21/2017   Procedure: BIOPSY;  Surgeon: Rogene Houston, MD;  [facility];  Service: Endoscopy;;  rectal, righta and left colon;  . COLONOSCOPY N/A 06/21/2017   Procedure: COLONOSCOPY;  Surgeon: Rogene Houston, MD;  Location: [facility];  Service: Endoscopy;  Laterality: N/A;  1:25  . INGUINAL HERNIA REPAIR Right 04/04/2018   Procedure: HERNIA REPAIR INGUINAL ADULT WITH MESH;  Surgeon: Aviva Signs, MD;  Location: [facility] ORS;  Service: General;  Laterality: Right;  . INGUINAL HERNIA REPAIR Right 01/31/2019   Procedure:  RECURRENT RIGHT INGUINAL HERNIORRHAPY WITH MESH, INCARCERATED;  Surgeon: Aviva Signs, MD;  Location: [facility];  Service: General;  Laterality: Right;  . KNEE SURGERY     tore mcl in high school (70's)       Home Medications    Prior to Admission medications   Medication Sig Start Date End Date Taking? Authorizing Provider  amoxicillin-clavulanate (AUGMENTIN) 875-125 MG tablet Take 1 tablet by mouth 2 (two) times daily for 7 days. 03/29/21 04/05/21 Yes Faustino Congress, NP  amLODipine (NORVASC) 10 MG tablet TAKE ONE TABLET (10MG TOTAL) BY MOUTH DAILY 11/20/20   [name], MD  Ascorbic Acid (VITAMIN C) 100 MG tablet Take 100 mg by mouth daily. OTC    [provider]  aspirin 325 MG tablet Take 1 tablet (325 mg total) by mouth daily. 04/16/18   Hosie Poisson, MD  atorvastatin (LIPITOR) 40 MG tablet TAKE ONE TABLET BY MOUTH AT 6PM 06/02/20   [name], MD  Calcium Carbonate Antacid (CALCIUM CARBONATE PO) Take by mouth. Unknown dose Patient not taking: Reported on 09/29/2020    [provider]  diphenhydrAMINE (BENADRYL) 25 MG tablet Take 25 mg by mouth at bedtime as needed.     [provider]  escitalopram (LEXAPRO) 20 MG tablet Take 1 tablet (20 mg total) by mouth at bedtime. 05/18/20   Frann Rider, NP  losartan (COZAAR) 100 MG tablet TAKE ONE (1) TABLET BY MOUTH EVERY  DAY 06/10/20   [name], MD  Mesalamine (DELZICOL) 400 MG CPDR DR capsule Take 2 capsules (800 mg total) by mouth 2 (two) times daily. 09/29/20   Laurine Blazer B, PA-C  metoprolol succinate (TOPROL-XL) 25 MG 24 hr tablet TAKE ONE TABLET (25MG TOTAL) BY MOUTH DAILY 09/08/20   [name], MD  predniSONE (DELTASONE) 20 MG tablet Take 2 tablets (40 mg total) by mouth daily with breakfast. 02/08/21   Scot Jun, FNP    Family History Family History  Problem Relation Age of Onset  . Tuberculosis Mother   . Stroke Mother   . Heart disease Father        died at 58   . Bradycardia Brother        pacemaker  . Stroke Brother   . Cancer Paternal Uncle   . Mental illness Paternal Grandfather        suicide at 70  . Colon cancer Neg Hx     Social History Social History   Tobacco Use  . Smoking status: Never Smoker  . Smokeless tobacco: Never Used  Vaping Use  . Vaping Use: Never used  Substance Use Topics  . Alcohol use: No    Alcohol/week: 0.0 standard drinks  . Drug use: No     Allergies   Patient has no known allergies.   Review of Systems Review of Systems   Physical Exam Triage Vital Signs ED Triage Vitals  Enc Vitals Group     BP 03/29/21 0847 110/68     Pulse Rate 03/29/21 0847 (!) 58     Resp 03/29/21 0847 17     Temp 03/29/21 0847 (!) 97.5 F (36.4 C)     Temp Source 03/29/21 0847 Tympanic     SpO2 03/29/21 0847 95 %     Weight --      Height --      Head Circumference --      Peak Flow --      Pain Score 03/29/21 0901 0     Pain Loc --      Pain Edu? --      Excl. in [place]? --    No data found.  Updated Vital Signs BP 110/68 (BP Location: Right Arm)   Pulse (!) 58   Temp (!) 97.5 F (36.4 C) (Tympanic)   Resp 17   SpO2 95%   Visual Acuity Right Eye Distance:   Left Eye Distance:   Bilateral Distance:    Right Eye Near:   Left Eye Near:    Bilateral Near:     Physical Exam Vitals and nursing note reviewed.  Constitutional:      General: He is not in acute distress.    Appearance: Normal appearance. He is well-developed and normal weight. He is not ill-appearing.  HENT:     Head: Normocephalic and atraumatic.     Nose: Nose normal.     Mouth/Throat:     Mouth: Mucous membranes are moist.     Pharynx: Oropharynx is clear.  Eyes:     General: Vision grossly intact. Gaze aligned appropriately.     Extraocular Movements: Extraocular movements intact.     Conjunctiva/sclera: Conjunctivae normal.      Comments: Area of swelling, no erythema, no drainage noted, non tender  Cardiovascular:      Rate and Rhythm: Normal rate and regular rhythm.     Heart sounds: No murmur heard.   Pulmonary:  Effort: Pulmonary effort is normal. No respiratory distress.     Breath sounds: Normal breath sounds.  Abdominal:     Palpations: Abdomen is soft.     Tenderness: There is no abdominal tenderness.  Musculoskeletal:        General: Normal range of motion.     Cervical back: Normal range of motion and neck supple.  Skin:    General: Skin is warm and dry.  Neurological:     Mental Status: He is alert.      UC Treatments / Results  Labs (all labs ordered are listed, but only abnormal results are displayed) Labs Reviewed - No data to display  EKG   Radiology No results found.  Procedures Procedures (including critical care time)  Medications Ordered in UC Medications  dexamethasone (DECADRON) injection 10 mg (10 mg Intramuscular Given 03/29/21 0923)    Initial Impression / Assessment and Plan / UC Course  I have reviewed the triage vital signs and the nursing notes.  Pertinent labs & imaging results that were available during my care of the patient were reviewed by me and considered in my medical decision making (see chart for details).    Swelling of left eye  Decadron 39m IM in office today Suspect allergic etiology, but will cover for bacterial with Augmentin 8746mBID x 7 days  Discussed taking the antibiotic if swelling has not improved over the next 24 hours after steroid injection Follow up with this office or with primary care if symptoms are persisting.  Follow up in the ER for high fever, trouble swallowing, trouble breathing, other concerning symptoms.   Final Clinical Impressions(s) / UC Diagnoses   Final diagnoses:  Swelling of left eye     Discharge Instructions     You have received a steroid injection in the office  I have sent in Augmentin for you to take twice a day for 7 days  Follow up with this office or with primary care if symptoms  are persisting.  Follow up in the ER for high fever, trouble swallowing, trouble breathing, other concerning symptoms.     ED Prescriptions    Medication Sig Dispense Auth. Provider   amoxicillin-clavulanate (AUGMENTIN) 875-125 MG tablet Take 1 tablet by mouth 2 (two) times daily for 7 days. 14 tablet MaFaustino CongressNP     PDMP not reviewed this encounter.   MaFaustino CongressNP 03/29/21 [id number]

## 2021-03-29 NOTE — Discharge Instructions (Signed)
You have received a steroid injection in the office  I have sent in Augmentin for you to take twice a day for 7 days  Follow up with this office or with primary care if symptoms are persisting.  Follow up in the ER for high fever, trouble swallowing, trouble breathing, other concerning symptoms.

## 2021-03-29 NOTE — Progress Notes (Signed)
I agree with the above plan 

## 2021-03-29 NOTE — Telephone Encounter (Signed)
Completed referral to ENT- Dr. Redmond Baseman or Dr. Wilburn Cornelia at [facility]. P: [phone].

## 2021-04-02 ENCOUNTER — Encounter: Payer: Self-pay | Admitting: Adult Health

## 2021-04-07 ENCOUNTER — Encounter: Payer: Self-pay | Admitting: Psychology

## 2021-04-13 ENCOUNTER — Encounter: Payer: Self-pay | Admitting: Psychology

## 2021-04-22 ENCOUNTER — Encounter: Payer: Self-pay | Admitting: Family Medicine

## 2021-04-26 ENCOUNTER — Other Ambulatory Visit: Payer: Self-pay

## 2021-04-26 ENCOUNTER — Ambulatory Visit (INDEPENDENT_AMBULATORY_CARE_PROVIDER_SITE_OTHER): Payer: Medicare Other | Admitting: Family Medicine

## 2021-04-26 ENCOUNTER — Encounter: Payer: Self-pay | Admitting: Family Medicine

## 2021-04-26 VITALS — BP 130/72 | HR 54 | Temp 98.0°F | Resp 16 | Ht 72.0 in | Wt 205.0 lb

## 2021-04-26 DIAGNOSIS — I6381 Other cerebral infarction due to occlusion or stenosis of small artery: Secondary | ICD-10-CM | POA: Diagnosis not present

## 2021-04-26 DIAGNOSIS — R413 Other amnesia: Secondary | ICD-10-CM | POA: Diagnosis not present

## 2021-04-26 NOTE — Progress Notes (Signed)
Subjective:    Patient ID: Justin [name], male    DOB: 06-07-1956, 65 y.o.   MRN: [id number]  HPI Recent update from patient's spouse: "My husband Justin [name] has an appointment with Dr. Dennard [name] on Monday at 2pm.  At my last appointment with Dr. Dennard [name] we discussed how Justin [name] should not be working at this time for safety reasons due to memory issues that he has been having .  Dr. Dennard [name] offered to have a conversation with Justin [name] should I need his assistance.  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  Justin [name]'s extensive cognitive test is scheduled for August 30.  After that we should know a little more about what is causing the issues with his memory.  I appreciate any help Dr. Dennard [name] can give."  Patient reports orthostatic dizziness.  This primarily occurs when he is squatting at work and when he stands up.  He will feel dizzy for split-second.  He feels lightheaded at times when this happens.  He denies any vertigo.  He denies any syncope.  He denies any chest pains or palpitations.  However, the patient is accompanied by his wife today.  I did not discussed the note that she sent me earlier.  We have had an extensive conversation privately regarding her husband.  He is demonstrating significant cognitive decline.  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  They have reported concerns about him working independently in the field.  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  We discussed this at length for more than 45 minutes today.  Patient has stopped excepting new work.  However he wants to gradually wind down and complete the jobs that he has.  I believe his wife is kept some of the concerns private from him and he was unaware of some of the  complaints.  Together we brought this to his attention as respectfully as possible.  Patient is very kind and polite.  He accepts this information without anger or resentment.  However he does not seem to respond to my suggestion.  I explained to him that I was concerned about him working in the [employer], [employer].  I am concerned that he may get hurt or that he may incorrectly perform his job duties leading to an unintended consequence such as a fire or an injury.  I recommended that he take no new business until we perform his cognitive assessment this summer.  He has an appointment for a full cognitive assessment in August.  I recommended that he rest until then and take a leave of absence.   Past Medical History:  Diagnosis Date   Anxiety    Diarrhea    Hypertension    OSA (obstructive sleep apnea)    cpap 9 cm H2O   Stroke [facility])    Past Surgical History:  Procedure Laterality Date   BIOPSY  06/21/2017   Procedure: BIOPSY;  Surgeon: Rogene Houston, MD;  [facility];  Service: Endoscopy;;  rectal, righta and left colon;   COLONOSCOPY N/A 06/21/2017   Procedure: COLONOSCOPY;  Surgeon: Rogene Houston, MD;  [facility];  Service: Endoscopy;  Laterality: N/A;  1:25   INGUINAL HERNIA REPAIR Right 04/04/2018  Procedure: HERNIA REPAIR INGUINAL ADULT WITH MESH;  Surgeon: Aviva Signs, MD;  Location: [facility];  Service: General;  Laterality: Right;   INGUINAL HERNIA REPAIR Right 01/31/2019   Procedure: RECURRENT RIGHT INGUINAL HERNIORRHAPY WITH MESH, INCARCERATED;  Surgeon: Aviva Signs, MD;  Location: [facility];  Service: General;  Laterality: Right;   KNEE SURGERY     tore mcl in high school (70's)   Current Outpatient Medications on File Prior to Visit  Medication Sig Dispense Refill   amLODipine (NORVASC) 10 MG tablet TAKE ONE TABLET (10MG TOTAL) BY MOUTH DAILY 90 tablet 3   Ascorbic Acid (VITAMIN C) 100 MG tablet Take 100 mg by mouth daily. OTC     aspirin 325 MG  tablet Take 1 tablet (325 mg total) by mouth daily. 30 tablet 0   atorvastatin (LIPITOR) 40 MG tablet TAKE ONE TABLET BY MOUTH AT 6PM 90 tablet 3   Calcium Carbonate Antacid (CALCIUM CARBONATE PO) Take by mouth. Unknown dose     diphenhydrAMINE (BENADRYL) 25 MG tablet Take 25 mg by mouth at bedtime as needed.      escitalopram (LEXAPRO) 20 MG tablet Take 1 tablet (20 mg total) by mouth at bedtime. 90 tablet 3   losartan (COZAAR) 100 MG tablet TAKE ONE (1) TABLET BY MOUTH EVERY DAY 90 tablet 3   Mesalamine (DELZICOL) 400 MG CPDR DR capsule Take 2 capsules (800 mg total) by mouth 2 (two) times daily. 360 capsule 3   metoprolol succinate (TOPROL-XL) 25 MG 24 hr tablet TAKE ONE TABLET (25MG TOTAL) BY MOUTH DAILY 90 tablet 3   predniSONE (DELTASONE) 20 MG tablet Take 2 tablets (40 mg total) by mouth daily with breakfast. 10 tablet 0   No current facility-administered medications on file prior to visit.   No Known Allergies Social History   Socioeconomic History   Marital status: Married    Spouse name: Justin [name]   Number of children: Not on file   Years of education: Not on file   Highest education level: Not on file  Occupational History   Not on file  Tobacco Use   Smoking status: Never   Smokeless tobacco: Never  Vaping Use   Vaping Use: Never used  Substance and Sexual Activity   Alcohol use: No    Alcohol/week: 0.0 standard drinks   Drug use: No   Sexual activity: Yes  Other Topics Concern   Not on file  Social History Narrative   Lives with wife   Social Determinants of Health   Financial Resource Strain: Not on file  Food Insecurity: Not on file  Transportation Needs: Not on file  Physical Activity: Not on file  Stress: Not on file  Social Connections: Not on file  Intimate Partner Violence: Not on file    Review of Systems     Objective:   Physical Exam Vitals reviewed.  Constitutional:      Appearance: Normal appearance. He is normal weight.  Cardiovascular:      Rate and Rhythm: Normal rate and regular rhythm.     Heart sounds: Normal heart sounds. No murmur heard.   No friction rub. No gallop.  Pulmonary:     Effort: Pulmonary effort is normal. No respiratory distress.     Breath sounds: Normal breath sounds. No stridor.  Abdominal:     General: Bowel sounds are normal. There is no distension.     Palpations: Abdomen is soft.     Tenderness: There is no abdominal tenderness. There is  no guarding.  Musculoskeletal:     Right lower leg: No edema.     Left lower leg: No edema.  Skin:    Findings: No erythema or rash.  Neurological:     General: No focal deficit present.     Mental Status: He is alert and oriented to person, place, and time.     Cranial Nerves: No cranial nerve deficit.     Motor: No weakness.     Gait: Gait normal.        Assessment & Plan:  Cerebrovascular accident (CVA) due to occlusion of small artery ([place])  Memory loss Please see the history of present illness.  I recommended a temporary leave of absence.  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  I am concerned for his safety along with potential accidents.  Patient agrees.  His wife was present for conversation.  Await the results of his formal cognitive assessment later this summer.  Spent more than 45 minutes today with the patient.  Recommended that he temporarily hold amlodipine and monitor his blood pressure as I believe he is experiencing orthostatic dizziness

## 2021-04-27 DIAGNOSIS — K219 Gastro-esophageal reflux disease without esophagitis: Secondary | ICD-10-CM | POA: Diagnosis present

## 2021-04-27 HISTORY — DX: Gastro-esophageal reflux disease without esophagitis: K21.9

## 2021-04-30 ENCOUNTER — Emergency Department (HOSPITAL_COMMUNITY): Payer: Medicare Other

## 2021-04-30 ENCOUNTER — Encounter (HOSPITAL_COMMUNITY): Payer: Self-pay | Admitting: Emergency Medicine

## 2021-04-30 ENCOUNTER — Other Ambulatory Visit: Payer: Self-pay

## 2021-04-30 ENCOUNTER — Inpatient Hospital Stay (HOSPITAL_COMMUNITY)
Admission: EM | Admit: 2021-04-30 | Discharge: 2021-05-05 | DRG: 097 | Disposition: A | Discharge status: Home / Self Care | Payer: Medicare Other | Attending: Internal Medicine | Admitting: Internal Medicine

## 2021-04-30 DIAGNOSIS — I10 Essential (primary) hypertension: Secondary | ICD-10-CM | POA: Diagnosis present

## 2021-04-30 DIAGNOSIS — K219 Gastro-esophageal reflux disease without esophagitis: Secondary | ICD-10-CM | POA: Diagnosis present

## 2021-04-30 DIAGNOSIS — G0490 Encephalitis and encephalomyelitis, unspecified: Principal | ICD-10-CM | POA: Diagnosis present

## 2021-04-30 DIAGNOSIS — G9341 Metabolic encephalopathy: Secondary | ICD-10-CM | POA: Diagnosis present

## 2021-04-30 DIAGNOSIS — Z79899 Other long term (current) drug therapy: Secondary | ICD-10-CM | POA: Diagnosis not present

## 2021-04-30 DIAGNOSIS — Z8616 Personal history of COVID-19: Secondary | ICD-10-CM

## 2021-04-30 DIAGNOSIS — Z6827 Body mass index (BMI) 27.0-27.9, adult: Secondary | ICD-10-CM | POA: Insufficient documentation

## 2021-04-30 DIAGNOSIS — F419 Anxiety disorder, unspecified: Secondary | ICD-10-CM | POA: Diagnosis present

## 2021-04-30 DIAGNOSIS — Z7982 Long term (current) use of aspirin: Secondary | ICD-10-CM

## 2021-04-30 DIAGNOSIS — Z8249 Family history of ischemic heart disease and other diseases of the circulatory system: Secondary | ICD-10-CM | POA: Diagnosis not present

## 2021-04-30 DIAGNOSIS — Z8673 Personal history of transient ischemic attack (TIA), and cerebral infarction without residual deficits: Secondary | ICD-10-CM

## 2021-04-30 DIAGNOSIS — I6340 Cerebral infarction due to embolism of unspecified cerebral artery: Secondary | ICD-10-CM | POA: Diagnosis present

## 2021-04-30 DIAGNOSIS — G4733 Obstructive sleep apnea (adult) (pediatric): Secondary | ICD-10-CM | POA: Diagnosis present

## 2021-04-30 DIAGNOSIS — Z20822 Contact with and (suspected) exposure to covid-19: Secondary | ICD-10-CM | POA: Diagnosis present

## 2021-04-30 DIAGNOSIS — E876 Hypokalemia: Secondary | ICD-10-CM | POA: Diagnosis not present

## 2021-04-30 DIAGNOSIS — G9389 Other specified disorders of brain: Secondary | ICD-10-CM | POA: Diagnosis present

## 2021-04-30 DIAGNOSIS — Z823 Family history of stroke: Secondary | ICD-10-CM

## 2021-04-30 DIAGNOSIS — K5190 Ulcerative colitis, unspecified, without complications: Secondary | ICD-10-CM | POA: Diagnosis present

## 2021-04-30 DIAGNOSIS — R4182 Altered mental status, unspecified: Secondary | ICD-10-CM

## 2021-04-30 HISTORY — DX: Encephalitis and encephalomyelitis, unspecified: G04.90

## 2021-04-30 HISTORY — DX: Metabolic encephalopathy: G93.41

## 2021-04-30 LAB — CBC WITH DIFFERENTIAL/PLATELET
Abs Immature Granulocytes: 0.07 10*3/uL (ref 0.00–0.07)
Basophils Absolute: 0 10*3/uL (ref 0.0–0.1)
Basophils Relative: 0 %
Eosinophils Absolute: 0.1 10*3/uL (ref 0.0–0.5)
Eosinophils Relative: 1 %
HCT: 46.9 % (ref 39.0–52.0)
Hemoglobin: 15.9 g/dL (ref 13.0–17.0)
Immature Granulocytes: 1 %
Lymphocytes Relative: 7 %
Lymphs Abs: 0.9 10*3/uL (ref 0.7–4.0)
MCH: 32.9 pg (ref 26.0–34.0)
MCHC: 33.9 g/dL (ref 30.0–36.0)
MCV: 96.9 fL (ref 80.0–100.0)
Monocytes Absolute: 0.5 10*3/uL (ref 0.1–1.0)
Monocytes Relative: 4 %
Neutro Abs: 10.3 10*3/uL — ABNORMAL HIGH (ref 1.7–7.7)
Neutrophils Relative %: 87 %
Platelets: 304 10*3/uL (ref 150–400)
RBC: 4.84 MIL/uL (ref 4.22–5.81)
RDW: 12.6 % (ref 11.5–15.5)
WBC: 11.9 10*3/uL — ABNORMAL HIGH (ref 4.0–10.5)
nRBC: 0 % (ref 0.0–0.2)

## 2021-04-30 LAB — TROPONIN I (HIGH SENSITIVITY)
Troponin I (High Sensitivity): 4 ng/L (ref <18)
Troponin I (High Sensitivity): 4 ng/L (ref <18)

## 2021-04-30 LAB — URINALYSIS, ROUTINE W REFLEX MICROSCOPIC
Bilirubin Urine: NEGATIVE
Glucose, UA: NEGATIVE mg/dL
Hgb urine dipstick: NEGATIVE
Ketones, ur: NEGATIVE mg/dL
Leukocytes,Ua: NEGATIVE
Nitrite: NEGATIVE
Protein, ur: NEGATIVE mg/dL
Specific Gravity, Urine: 1.015 (ref 1.005–1.030)
pH: 6 (ref 5.0–8.0)

## 2021-04-30 LAB — RESP PANEL BY RT-PCR (FLU A&B, COVID) ARPGX2
Influenza A by PCR: NEGATIVE
Influenza B by PCR: NEGATIVE
SARS Coronavirus 2 by RT PCR: NEGATIVE

## 2021-04-30 LAB — COMPREHENSIVE METABOLIC PANEL
ALT: 32 U/L (ref 0–44)
AST: 30 U/L (ref 15–41)
Albumin: 4.2 g/dL (ref 3.5–5.0)
Alkaline Phosphatase: 92 U/L (ref 38–126)
Anion gap: 6 (ref 5–15)
BUN: 18 mg/dL (ref 8–23)
CO2: 25 mmol/L (ref 22–32)
Calcium: 9.2 mg/dL (ref 8.9–10.3)
Chloride: 103 mmol/L (ref 98–111)
Creatinine, Ser: 0.98 mg/dL (ref 0.61–1.24)
GFR, Estimated: >60 mL/min (ref >60)
Glucose, Bld: 128 mg/dL — ABNORMAL HIGH (ref 70–99)
Potassium: 3.7 mmol/L (ref 3.5–5.1)
Sodium: 134 mmol/L — ABNORMAL LOW (ref 135–145)
Total Bilirubin: 1.2 mg/dL (ref 0.3–1.2)
Total Protein: 7.3 g/dL (ref 6.5–8.1)

## 2021-04-30 LAB — RAPID URINE DRUG SCREEN, HOSP PERFORMED
Amphetamines: NOT DETECTED
Barbiturates: NOT DETECTED
Benzodiazepines: NOT DETECTED
Cocaine: NOT DETECTED
Opiates: NOT DETECTED
Tetrahydrocannabinol: NOT DETECTED

## 2021-04-30 LAB — PROTIME-INR
INR: 1.1 (ref 0.8–1.2)
Prothrombin Time: 14.6 s (ref 11.4–15.2)

## 2021-04-30 LAB — ETHANOL: Alcohol, Ethyl (B): <10 mg/dL (ref <10)

## 2021-04-30 LAB — APTT: aPTT: 28 s (ref 24–36)

## 2021-04-30 MED ORDER — VANCOMYCIN HCL IN DEXTROSE 1-5 GM/200ML-% IV SOLN
1000.0000 mg | Freq: Three times a day (TID) | INTRAVENOUS | Status: DC
  Administered 2021-05-01 (×2): 1000 mg via INTRAVENOUS
  Filled 2021-04-30 (×5): qty 200

## 2021-04-30 MED ORDER — SODIUM CHLORIDE 0.9 % IV SOLN: INTRAVENOUS | Status: DC

## 2021-04-30 MED ORDER — VANCOMYCIN HCL IN DEXTROSE 1-5 GM/200ML-% IV SOLN
1000.0000 mg | Freq: Once | INTRAVENOUS | Status: AC
  Administered 2021-04-30: 1000 mg via INTRAVENOUS
  Filled 2021-04-30: qty 200

## 2021-04-30 MED ORDER — DEXTROSE 5 % IV SOLN
10.0000 mg/kg | Freq: Three times a day (TID) | INTRAVENOUS | Status: DC
  Administered 2021-04-30 – 2021-05-03 (×7): 930 mg via INTRAVENOUS
  Filled 2021-04-30 (×16): qty 18.6

## 2021-04-30 MED ORDER — METOPROLOL SUCCINATE ER 25 MG PO TB24: 25.0000 mg | ORAL_TABLET | Freq: Every day | ORAL | Status: DC

## 2021-04-30 MED ORDER — AMPICILLIN SODIUM 2 G IJ SOLR
2.0000 g | INTRAMUSCULAR | Status: DC
  Administered 2021-04-30 – 2021-05-01 (×2): 2 g via INTRAVENOUS
  Filled 2021-04-30 (×19): qty 2000

## 2021-04-30 MED ORDER — LORAZEPAM 2 MG/ML IJ SOLN
1.0000 mg | Freq: Once | INTRAMUSCULAR | Status: AC
  Administered 2021-04-30: 1 mg via INTRAVENOUS
  Filled 2021-04-30: qty 1

## 2021-04-30 MED ORDER — ATORVASTATIN CALCIUM 40 MG PO TABS
40.0000 mg | ORAL_TABLET | Freq: Every day | ORAL | Status: DC
  Administered 2021-05-01 – 2021-05-05 (×5): 40 mg via ORAL
  Filled 2021-04-30 (×5): qty 1

## 2021-04-30 MED ORDER — SODIUM CHLORIDE 0.9 % IV SOLN
2.0000 g | Freq: Two times a day (BID) | INTRAVENOUS | Status: DC
  Administered 2021-04-30 – 2021-05-01 (×3): 2 g via INTRAVENOUS
  Filled 2021-04-30 (×3): qty 20

## 2021-04-30 MED ORDER — MESALAMINE 400 MG PO CPDR
800.0000 mg | DELAYED_RELEASE_CAPSULE | Freq: Two times a day (BID) | ORAL | Status: DC
Start: 2021-04-30 — End: 2021-05-01
  Administered 2021-05-01: 800 mg via ORAL
  Filled 2021-04-30 (×8): qty 2

## 2021-04-30 NOTE — ED Triage Notes (Signed)
Pt reports pt has hx of stroke x 3 years ago; pt has recent memory issues but wife feels he's more confused than normal; LKW 0845; EDP at bedside for MSE

## 2021-04-30 NOTE — ED Notes (Signed)
Wife went home to get pts cpap.

## 2021-04-30 NOTE — ED Notes (Signed)
Sats dropped to 88% on room air after ativan, Oxygen at 2lnc applied.

## 2021-04-30 NOTE — ED Provider Notes (Signed)
Emergency Medicine Provider Triage Evaluation Note  Justin [name] , a 65 y.o. male  was evaluated in triage.  Pt complains of confusion.  Review of Systems  Positive: confusion Negative: weakness  Physical Exam  BP (!) 169/93 (BP Location: Right Arm)   Pulse 60   Temp 97.9 F (36.6 C) (Oral)   Resp 18   Ht 6' 1"  (1.854 m)   Wt 93 kg   SpO2 100%   BMI 27.05 kg/m  Gen:   Awake, no distress   Resp:  Normal effort  MSK:   Moves extremities without difficulty no facial droop Other:  Calm and cooperative  Medical Decision Making  Medically screening exam initiated at 1:15 PM.  Appropriate orders placed.  Justin [name] was informed that the remainder of the evaluation will be completed by another provider, this initial triage assessment does not replace that evaluation, and the importance of remaining in the ED until their evaluation is complete.  Remote history of stroke. Increasing memory problems recently. Wife concerned about confusion today. No focal deficits to suggest acute infarct.    Truddie Hidden, MD 04/30/21 1316

## 2021-04-30 NOTE — ED Notes (Signed)
Intermittent receptive and expressive aphasia noted.

## 2021-04-30 NOTE — H&P (Signed)
TRH H&P   Patient Demographics:    Justin [name], is a 65 y.o. male  MRN: [id number]   DOB - [birth year]  Admit Date - 04/30/2021  Outpatient Primary MD for the patient is [name], MD  Referring MD/NP/PA: PA COuture    Patient coming from: Home  Chief Complaint  Patient presents with   Altered Mental Status      HPI:    Justin [name]  is a 65 y.o. male, with past medical history of anxiety, hypertension, OSA on CPAP, CVA, patient was brought by his wife for evaluation for consult mental status, wife report she left husband this morning at 9 AM with normal state of health, when she came back later in the day was significantly confused, wife report overall patient has some memory problems over the last few month, but has been mild, and they have worsened after stroke last year, as well after COVID infection last year, but overall he was still working(but he had to cut significantly to his mobility issues over the last few month), he was driving until yesterday, but today he is much more confused, and she was not not able to have any meaningful conversation with him, so she brought him to ED. - in ED patient is afebrile, with no meningeal signs, but his MRI did show abnormal signal involving the mesial left temporal lobe, with differential including subacute infarction, encephalitis and seizure, LP was unsuccessful in ED, patient was started empirically on acyclovir, ampicillin, Rocephin and vancomycin, received 1 mg of IV Ativan without significant improvement of mental status, but he became more sleepy after receiving it, [facility] hospitalist consulted to admit.    Review of systems:    He is significantly confused, unable to provide any reliable review of system   With Past History of the following :    Past Medical History:  Diagnosis Date   Anxiety    Diarrhea     Hypertension    OSA (obstructive sleep apnea)    cpap 9 cm H2O   Stroke [facility])       Past Surgical History:  Procedure Laterality Date   BIOPSY  06/21/2017   Procedure: BIOPSY;  Surgeon: Rogene Houston, MD;  Location: [facility];  Service: Endoscopy;;  rectal, righta and left colon;   COLONOSCOPY N/A 06/21/2017   Procedure: COLONOSCOPY;  Surgeon: Rogene Houston, MD;  Location: [facility];  Service: Endoscopy;  Laterality: N/A;  1:25   INGUINAL HERNIA REPAIR Right 04/04/2018   Procedure: HERNIA REPAIR INGUINAL ADULT WITH MESH;  Surgeon: Aviva Signs, MD;  Location: [facility] ORS;  Service: General;  Laterality: Right;   INGUINAL HERNIA REPAIR Right 01/31/2019   Procedure: RECURRENT RIGHT INGUINAL HERNIORRHAPY WITH MESH, INCARCERATED;  Surgeon: Aviva Signs, MD;  Location: [facility] ORS;  Service: General;  Laterality: Right;   KNEE SURGERY     tore  mcl in high school (70's)      Social History:     Social History   Tobacco Use   Smoking status: Never   Smokeless tobacco: Never  Substance Use Topics   Alcohol use: No    Alcohol/week: 0.0 standard drinks     Lives -at home, with wife  Mobility -independent     Family History :     Family History  Problem Relation Age of Onset   Tuberculosis Mother    Stroke Mother    Heart disease Father        died at 49   Bradycardia Brother        pacemaker   Stroke Brother    Cancer Paternal Uncle    Mental illness Paternal Grandfather        suicide at 83   Colon cancer Neg Hx       Home Medications:   Prior to Admission medications   Medication Sig Start Date End Date Taking? Authorizing Provider  Ascorbic Acid (VITAMIN C) 100 MG tablet Take 100 mg by mouth daily. OTC   Yes [provider]  aspirin 325 MG tablet Take 1 tablet (325 mg total) by mouth daily. 04/16/18  Yes Hosie Poisson, MD  atorvastatin (LIPITOR) 40 MG tablet TAKE ONE TABLET BY MOUTH AT 6PM Patient taking differently: Take 40 mg by mouth daily.  06/02/20  Yes [name], MD  diphenhydrAMINE (BENADRYL) 25 MG tablet Take 25 mg by mouth at bedtime as needed.    Yes [provider]  escitalopram (LEXAPRO) 20 MG tablet Take 1 tablet (20 mg total) by mouth at bedtime. 05/18/20  Yes McCue, Janett Billow, NP  hydrochlorothiazide (HYDRODIURIL) 25 MG tablet Take 25 mg by mouth daily.   Yes [provider]  losartan (COZAAR) 100 MG tablet TAKE ONE (1) TABLET BY MOUTH EVERY DAY 06/10/20  Yes [name], MD  Mesalamine (DELZICOL) 400 MG CPDR DR capsule Take 2 capsules (800 mg total) by mouth 2 (two) times daily. 09/29/20  Yes Laurine Blazer B, PA-C  metoprolol succinate (TOPROL-XL) 25 MG 24 hr tablet TAKE ONE TABLET (25MG TOTAL) BY MOUTH DAILY Patient taking differently: Take 25 mg by mouth daily. 09/08/20  Yes [name], MD  amLODipine (NORVASC) 10 MG tablet TAKE ONE TABLET (10MG TOTAL) BY MOUTH DAILY Patient not taking: Reported on 04/30/2021 11/20/20   [name], MD  diphenhydrAMINE (SOMINEX) 25 MG tablet Take by mouth. Patient not taking: Reported on 04/30/2021    [provider]  etodolac (LODINE) 400 MG tablet etodolac 400 mg tablet Patient not taking: Reported on 04/30/2021    [provider]  predniSONE (DELTASONE) 20 MG tablet Take by mouth. Patient not taking: Reported on 04/30/2021 02/07/21   [provider]     Allergies:    No Known Allergies   Physical Exam:   Vitals  Blood pressure 126/75, pulse 61, temperature 97.9 F (36.6 C), temperature source Oral, resp. rate 20, height 6' 1"  (1.854 m), weight 93 kg, SpO2 98 %.   1. General developed male, laying in bed, no apparent distress  2.  Diminished cognition and insight, significantly confused, unable to answer any questions appropriately,  3. No F.N deficits, ALL C.Nerves Intact, Strength 5/5 all 4 extremities, Sensation intact all 4 extremities, Plantars down going.  4. Ears and Eyes appear Normal, Conjunctivae  clear, PERRLA. Moist Oral Mucosa.  5. Supple Neck, No JVD, No cervical lymphadenopathy appriciated, No Carotid Bruits.  6. Symmetrical Chest wall movement, Good air movement bilaterally, CTAB.  7. RRR, No Gallops, Rubs or Murmurs, No Parasternal Heave.  8. Positive Bowel Sounds, Abdomen Soft, No tenderness, No organomegaly appriciated,No rebound -guarding or rigidity.  9.  No Cyanosis, Normal Skin Turgor, No Skin Rash or Bruise.  10. Good muscle tone,  joints appear normal , no effusions, Normal ROM.  11. No Palpable Lymph Nodes in Neck or Axillae    Data Review:    CBC Recent Labs  Lab 04/30/21 1344  WBC 11.9*  HGB 15.9  HCT 46.9  PLT 304  MCV 96.9  MCH 32.9  MCHC 33.9  RDW 12.6  LYMPHSABS 0.9  MONOABS 0.5  EOSABS 0.1  BASOSABS 0.0   ------------------------------------------------------------------------------------------------------------------  Chemistries  Recent Labs  Lab 04/30/21 1344  NA 134*  K 3.7  CL 103  CO2 25  GLUCOSE 128*  BUN 18  CREATININE 0.98  CALCIUM 9.2  AST 30  ALT 32  ALKPHOS 92  BILITOT 1.2   ------------------------------------------------------------------------------------------------------------------ estimated creatinine clearance is 84.9 mL/min (by C-G formula based on SCr of 0.98 mg/dL). ------------------------------------------------------------------------------------------------------------------ No results for input(s): TSH, T4TOTAL, T3FREE, THYROIDAB in the last 72 hours.  Invalid input(s): FREET3  Coagulation profile Recent Labs  Lab 04/30/21 1344  INR 1.1   ------------------------------------------------------------------------------------------------------------------- No results for input(s): DDIMER in the last 72 hours. -------------------------------------------------------------------------------------------------------------------  Cardiac Enzymes No results for input(s): CKMB, TROPONINI, MYOGLOBIN  in the last 168 hours.  Invalid input(s): CK ------------------------------------------------------------------------------------------------------------------ No results found for: BNP   ---------------------------------------------------------------------------------------------------------------  Urinalysis    Component Value Date/Time   COLORURINE YELLOW 04/30/2021 [place] 04/30/2021 1709   LABSPEC 1.015 04/30/2021 1709   PHURINE 6.0 04/30/2021 1709   GLUCOSEU NEGATIVE 04/30/2021 1709   HGBUR NEGATIVE 04/30/2021 1709   BILIRUBINUR NEGATIVE 04/30/2021 1709   KETONESUR NEGATIVE 04/30/2021 1709   PROTEINUR NEGATIVE 04/30/2021 1709   NITRITE NEGATIVE 04/30/2021 1709   LEUKOCYTESUR NEGATIVE 04/30/2021 1709    ----------------------------------------------------------------------------------------------------------------   Imaging Results:    DG Chest 2 View  Result Date: 04/30/2021 CLINICAL DATA:  Cough and altered mental status. EXAM: CHEST - 2 VIEW COMPARISON:  Chest x-ray dated December 02, 2020. FINDINGS: The heart size and mediastinal contours are within normal limits. Both lungs are clear. The visualized skeletal structures are unremarkable. IMPRESSION: No active cardiopulmonary disease. Electronically Signed   By: Titus Dubin M.D.   On: 04/30/2021 14:59   CT Head Wo Contrast  Result Date: 04/30/2021 CLINICAL DATA:  Mental status change with increased confusion. History of stroke 3 years ago. EXAM: CT HEAD WITHOUT CONTRAST TECHNIQUE: Contiguous axial images were obtained from the base of the skull through the vertex without intravenous contrast. COMPARISON:  CT head 04/13/2018.  Head MRI 05/29/2020. FINDINGS: Brain: There is no evidence of acute intracranial hemorrhage, mass lesion, brain edema or extra-axial fluid collection. Stable mild atrophy with mild prominence of the ventricles and subarachnoid spaces. Stable mild chronic small vessel ischemic  changes in the periventricular white matter. There is no CT evidence of acute cortical infarction. Vascular: Intracranial vascular calcifications. No hyperdense vessel identified. Skull: Negative for fracture or suspicious focal lesion. Stable benign lucencies in the frontal bone bilaterally. Sinuses/Orbits: The visualized paranasal sinuses and mastoid air cells are clear. No orbital abnormalities are seen. Other: None. IMPRESSION: Stable head CT without acute intracranial findings. Mild atrophy and chronic small vessel ischemic changes. Electronically Signed   By: Richardean Sale M.D.   On: 04/30/2021 14:05  MR BRAIN WO CONTRAST  Result Date: 04/30/2021 CLINICAL DATA:  Increasing memory problems EXAM: MRI HEAD WITHOUT CONTRAST TECHNIQUE: Multiplanar, multiecho pulse sequences of the brain and surrounding structures were obtained without intravenous contrast. COMPARISON:  Images only 05/29/2020.  Dictation by non-radiologist. FINDINGS: Brain: There is abnormal diffusion hyperintensity along the head and body of the left hippocampus. Probable asymmetric involvement of the uncus as well. Suspect subtle corresponding T2 FLAIR hyperintensity. No mass effect. No evidence of intracranial hemorrhage. Patchy additional foci of T2 hyperintensity in the supratentorial white matter are nonspecific but may reflect minor chronic microvascular ischemic changes. Prominence of the ventricles and sulci reflects generalized parenchymal volume loss. There is no hydrocephalus or extra-axial fluid collection. Vascular: Major vessel flow voids at the skull base are preserved. Skull and upper cervical spine: Normal marrow signal is preserved. Sinuses/Orbits: Paranasal sinuses are aerated. Orbits are unremarkable. Other: Sella is unremarkable.  Mastoid air cells are clear. IMPRESSION: Abnormal signal involving the mesial left temporal lobe. Differential considerations include subacute infarction, encephalitis (autoimmune or early  changes of herpes), and seizure effect. There is no expansion or mass effect suggest glioma. Follow-up is recommended. CSF sampling could be considered depending on clinical suspicion. Electronically Signed   By: Macy Mis M.D.   On: 04/30/2021 16:12    My personal review of EKG:   Vent. rate 52 BPM PR interval 212 ms QRS duration 156 ms QT/QTcB 480/447 ms P-R-T axes 65 59 21 Sinus rhythm Borderline prolonged PR interval Right bundle branch block Baseline wander in lead(s) V3    Assessment & Plan:    Active Problems:   OSA (obstructive sleep apnea)   Cerebral embolism with cerebral infarction   Acute metabolic encephalopathy   Encephalitis   Acid reflux  Acute Encephalopathy -Patient with significant confusion, more pronounced today, but that sounds to been intermittent over the last few weeks. -MRI brain with significant for abnormal signal in the left temporal lobe, differential include subacute infarction, encephalitis versus seizure. -Patient has no focal deficits, likely related to ischemic event. -He will be kept empirically on encephalitis antibiotics/acyclovir coverage, LP attempt in ED is unsuccessful. -As well there is concern for seizures, defer to [facility]y at [facility] after routine versus LTG EEG, he received 1 mg of IV Ativan in ED, he remains confused. -We will keep on IV fluids given his antibiotics/acyclovir regimen  Hypertension -Blood pressure is acceptable, will hold Toprol-XL given his heart rate in the low 50s, will hold his hydrochlorothiazide and losartan for now, Norvasc was recently stopped by his PCP.    OSA - Continue with CPAP  History of CVA -Continue with aspirin after LP  History of ulcerative colitis -Continue with mesalamine    DVT Prophylaxis SCDs, can start DVT prophylaxis after LP is done  AM Labs Ordered, also please review Full Orders  Family Communication: Admission, patients condition and plan of care including tests  being ordered have been discussed with the patient and wife at bedside who indicate understanding and agree with the plan and Code Status.  Code Status Full  Likely DC to  Home  Condition GUARDED    Consults called: neurology by ED    Admission status: inpatient    Time spent in minutes : 60 minutes   Phillips Climes M.D on 04/30/2021 at 9:14 PM   [facility] - Office  [phone]

## 2021-04-30 NOTE — ED Provider Notes (Signed)
[facility] EMERGENCY DEPARTMENT Provider Note   CSN: [id number] Arrival date & time: 04/30/21  1244     History Chief Complaint  Patient presents with   Altered Mental Status    Justin [name] is a 65 y.o. male.  HPI   65 y/o male with a h/o anxiety, diarrhea, HTN, PSA, CVA, who presents to the ED today for eval of altered mental status.  His wife is at bedside and assist with the history.  She states that the patient's last known well was at 845 this morning when she left the house to go to church.  She states patient has a baseline history of some memory problems but today when she came home from church his mental status had declined significantly and she states that he was not making sense and was not able to have a meaningful conversation with her which is not typical for him.  She states that he has seemed to be in his normal state of health recently other than having a mild intermittent cough.  He also had a little bit of lightheadedness earlier this week, saw his PCP and his BP meds were decreased at that time.  She otherwise reports no recent changes with the patient's health status.  The patient is unable to provide any meaningful history at this time due to his altered mental status and therefore there is a level 5 caveat.  Past Medical History:  Diagnosis Date   Anxiety    Diarrhea    Hypertension    OSA (obstructive sleep apnea)    cpap 9 cm H2O   Stroke [facility])     Patient Active Problem List   Diagnosis Date Noted   Acute metabolic encephalopathy 67/89/3810   Encephalitis 04/30/2021   Ulcerative colitis ([place]) 09/24/2019   Incarcerated right inguinal hernia 01/30/2019   SBO (small bowel obstruction) (HCC)    Left sided lacunar stroke ([place]) 05/24/2018   Cerebral embolism with cerebral infarction 04/15/2018   Right facial numbness 04/13/2018   Recurrent unilateral inguinal hernia with obstruction and without gangrene    Obstructive sleep apnea treated with continuous  positive airway pressure (CPAP) 03/13/2018   Hypersomnia with sleep apnea 12/15/2017   Diarrhea 05/08/2017   Chest pain 05/19/2015   Anxiety 05/19/2015   Hyperglycemia 05/19/2015   Hypertension     Past Surgical History:  Procedure Laterality Date   BIOPSY  06/21/2017   Procedure: BIOPSY;  Surgeon: Rogene Houston, MD;  Location: [facility] ENDO [facility];  Service: Endoscopy;;  rectal, righta and left colon;   COLONOSCOPY N/A 06/21/2017   Procedure: COLONOSCOPY;  Surgeon: Rogene Houston, MD;  Location: [facility] ENDO SUITE;  Service: Endoscopy;  Laterality: N/A;  1:25   INGUINAL HERNIA REPAIR Right 04/04/2018   Procedure: HERNIA REPAIR INGUINAL ADULT WITH MESH;  Surgeon: Aviva Signs, MD;  Location: [facility] ORS;  Service: General;  Laterality: Right;   INGUINAL HERNIA REPAIR Right 01/31/2019   Procedure: RECURRENT RIGHT INGUINAL HERNIORRHAPY WITH MESH, INCARCERATED;  Surgeon: Aviva Signs, MD;  Location: [facility] ORS;  Service: General;  Laterality: Right;   KNEE SURGERY     tore mcl in high school (71's)       Family History  Problem Relation Age of Onset   Tuberculosis Mother    Stroke Mother    Heart disease Father        died at 58   Bradycardia Brother        pacemaker   Stroke Brother  Cancer Paternal Uncle    Mental illness Paternal Grandfather        suicide at 68   Colon cancer Neg Hx     Social History   Tobacco Use   Smoking status: Never   Smokeless tobacco: Never  Vaping Use   Vaping Use: Never used  Substance Use Topics   Alcohol use: No    Alcohol/week: 0.0 standard drinks   Drug use: No    Home Medications Prior to Admission medications   Medication Sig Start Date End Date Taking? Authorizing Provider  Ascorbic Acid (VITAMIN C) 100 MG tablet Take 100 mg by mouth daily. OTC   Yes [provider]  aspirin 325 MG tablet Take 1 tablet (325 mg total) by mouth daily. 04/16/18  Yes Hosie Poisson, MD  atorvastatin (LIPITOR) 40 MG tablet TAKE ONE TABLET BY MOUTH AT  6PM Patient taking differently: Take 40 mg by mouth daily. 06/02/20  Yes [name], MD  diphenhydrAMINE (BENADRYL) 25 MG tablet Take 25 mg by mouth at bedtime as needed.    Yes [provider]  escitalopram (LEXAPRO) 20 MG tablet Take 1 tablet (20 mg total) by mouth at bedtime. 05/18/20  Yes McCue, Janett Billow, NP  hydrochlorothiazide (HYDRODIURIL) 25 MG tablet Take 25 mg by mouth daily.   Yes [provider]  losartan (COZAAR) 100 MG tablet TAKE ONE (1) TABLET BY MOUTH EVERY DAY 06/10/20  Yes [name], MD  Mesalamine (DELZICOL) 400 MG CPDR DR capsule Take 2 capsules (800 mg total) by mouth 2 (two) times daily. 09/29/20  Yes Laurine Blazer B, PA-C  metoprolol succinate (TOPROL-XL) 25 MG 24 hr tablet TAKE ONE TABLET (25MG TOTAL) BY MOUTH DAILY Patient taking differently: Take 25 mg by mouth daily. 09/08/20  Yes [name], MD  amLODipine (NORVASC) 10 MG tablet TAKE ONE TABLET (10MG TOTAL) BY MOUTH DAILY Patient not taking: Reported on 04/30/2021 11/20/20   [name], MD  diphenhydrAMINE (SOMINEX) 25 MG tablet Take by mouth. Patient not taking: Reported on 04/30/2021    [provider]  etodolac (LODINE) 400 MG tablet etodolac 400 mg tablet Patient not taking: Reported on 04/30/2021    [provider]  predniSONE (DELTASONE) 20 MG tablet Take by mouth. Patient not taking: Reported on 04/30/2021 02/07/21   [provider]    Allergies    Patient has no known allergies.  Review of Systems   Review of Systems  Constitutional:  Negative for chills and fever.  HENT:  Negative for ear pain and sore throat.   Eyes:  Negative for pain and visual disturbance.  Respiratory:  Positive for cough. Negative for shortness of breath.   Cardiovascular:  Negative for chest pain.  Gastrointestinal:  Negative for abdominal pain, constipation, diarrhea, nausea and vomiting.  Genitourinary:  Negative for dysuria and hematuria.  Musculoskeletal:   Negative for back pain.  Skin:  Negative for rash.  Neurological:  Positive for dizziness, speech difficulty and light-headedness. Negative for seizures, syncope, weakness and headaches.       AMS  All other systems reviewed and are negative.  Physical Exam Updated Vital Signs BP 126/69   Pulse (!) 57   Temp 97.9 F (36.6 C) (Oral)   Resp (!) 25   Ht 6' 1"  (1.854 m)   Wt 93 kg   SpO2 94%   BMI 27.05 kg/m   Physical Exam Vitals and nursing note reviewed.  Constitutional:      Appearance: He  is well-developed.  HENT:     Head: Normocephalic and atraumatic.  Eyes:     Extraocular Movements: Extraocular movements intact.     Conjunctiva/sclera: Conjunctivae normal.     Pupils: Pupils are equal, round, and reactive to light.  Neck:     Comments: No meningismus Cardiovascular:     Rate and Rhythm: Normal rate and regular rhythm.     Heart sounds: Normal heart sounds. No murmur heard. Pulmonary:     Effort: Pulmonary effort is normal. No respiratory distress.     Breath sounds: Normal breath sounds. No wheezing, rhonchi or rales.  Abdominal:     General: Bowel sounds are normal.     Palpations: Abdomen is soft.     Tenderness: There is no abdominal tenderness. There is no guarding or rebound.  Musculoskeletal:     Cervical back: Neck supple.  Skin:    General: Skin is warm and dry.  Neurological:     Mental Status: He is alert.     Comments: Mental Status:  Alert, oriented to self/time. Unable to state location. Confused. Answers questions inappropriately. Able to follow simple commands, but requires redirection. Unable to follow complex commands.  Cranial Nerves:  II:  pupils equal, round, reactive to light III,IV, VI: ptosis not present, extra-ocular motions intact bilaterally  V,VII: smile symmetric, facial light touch sensation equal VIII: hearing grossly normal to voice  X: uvula elevates symmetrically  XI: bilateral shoulder shrug symmetric and strong XII:  midline tongue extension without fassiculations Motor:  Normal tone. 5/5 strength of BUE and BLE major muscle groups  Sensory: unable to elicit sensation (pt does not understand this part of the exam) Gait: normal gait and balance.      ED Results / Procedures / Treatments   Labs (all labs ordered are listed, but only abnormal results are displayed) Labs Reviewed  COMPREHENSIVE METABOLIC PANEL - Abnormal; Notable for the following components:      Result Value   Sodium 134 (*)    Glucose, Bld 128 (*)    All other components within normal limits  CBC WITH DIFFERENTIAL/PLATELET - Abnormal; Notable for the following components:   WBC 11.9 (*)    Neutro Abs 10.3 (*)    All other components within normal limits  RESP PANEL BY RT-PCR (FLU A&B, COVID) ARPGX2  CULTURE, BLOOD (ROUTINE X 2)  CULTURE, BLOOD (ROUTINE X 2)  CSF CULTURE W GRAM STAIN  URINALYSIS, ROUTINE W REFLEX MICROSCOPIC  ETHANOL  PROTIME-INR  APTT  RAPID URINE DRUG SCREEN, HOSP PERFORMED  CSF CELL COUNT WITH DIFFERENTIAL  PROTEIN AND GLUCOSE, CSF  HSV 1/2 PCR, CSF  VDRL, CSF  TROPONIN I (HIGH SENSITIVITY)  TROPONIN I (HIGH SENSITIVITY)    EKG EKG Interpretation  Date/Time:  Friday April 30 2021 14:38:37 EDT Ventricular Rate:  52 PR Interval:  212 QRS Duration: 156 QT Interval:  480 QTC Calculation: 447 R Axis:   59 Text Interpretation: Sinus rhythm Borderline prolonged PR interval Right bundle branch block Baseline wander in lead(s) V3 Confirmed by Octaviano Glow [phone]) on 04/30/2021 4:36:28 PM  Radiology DG Chest 2 View  Result Date: 04/30/2021 CLINICAL DATA:  Cough and altered mental status. EXAM: CHEST - 2 VIEW COMPARISON:  Chest x-ray dated December 02, 2020. FINDINGS: The heart size and mediastinal contours are within normal limits. Both lungs are clear. The visualized skeletal structures are unremarkable. IMPRESSION: No active cardiopulmonary disease. Electronically Signed   By: Titus Dubin M.D.    On:  04/30/2021 14:59   CT Head Wo Contrast  Result Date: 04/30/2021 CLINICAL DATA:  Mental status change with increased confusion. History of stroke 3 years ago. EXAM: CT HEAD WITHOUT CONTRAST TECHNIQUE: Contiguous axial images were obtained from the base of the skull through the vertex without intravenous contrast. COMPARISON:  CT head 04/13/2018.  Head MRI 05/29/2020. FINDINGS: Brain: There is no evidence of acute intracranial hemorrhage, mass lesion, brain edema or extra-axial fluid collection. Stable mild atrophy with mild prominence of the ventricles and subarachnoid spaces. Stable mild chronic small vessel ischemic changes in the periventricular white matter. There is no CT evidence of acute cortical infarction. Vascular: Intracranial vascular calcifications. No hyperdense vessel identified. Skull: Negative for fracture or suspicious focal lesion. Stable benign lucencies in the frontal bone bilaterally. Sinuses/Orbits: The visualized paranasal sinuses and mastoid air cells are clear. No orbital abnormalities are seen. Other: None. IMPRESSION: Stable head CT without acute intracranial findings. Mild atrophy and chronic small vessel ischemic changes. Electronically Signed   By: Richardean Sale M.D.   On: 04/30/2021 14:05   MR BRAIN WO CONTRAST  Result Date: 04/30/2021 CLINICAL DATA:  Increasing memory problems EXAM: MRI HEAD WITHOUT CONTRAST TECHNIQUE: Multiplanar, multiecho pulse sequences of the brain and surrounding structures were obtained without intravenous contrast. COMPARISON:  Images only 05/29/2020.  Dictation by non-radiologist. FINDINGS: Brain: There is abnormal diffusion hyperintensity along the head and body of the left hippocampus. Probable asymmetric involvement of the uncus as well. Suspect subtle corresponding T2 FLAIR hyperintensity. No mass effect. No evidence of intracranial hemorrhage. Patchy additional foci of T2 hyperintensity in the supratentorial white matter are nonspecific but  may reflect minor chronic microvascular ischemic changes. Prominence of the ventricles and sulci reflects generalized parenchymal volume loss. There is no hydrocephalus or extra-axial fluid collection. Vascular: Major vessel flow voids at the skull base are preserved. Skull and upper cervical spine: Normal marrow signal is preserved. Sinuses/Orbits: Paranasal sinuses are aerated. Orbits are unremarkable. Other: Sella is unremarkable.  Mastoid air cells are clear. IMPRESSION: Abnormal signal involving the mesial left temporal lobe. Differential considerations include subacute infarction, encephalitis (autoimmune or early changes of herpes), and seizure effect. There is no expansion or mass effect suggest glioma. Follow-up is recommended. CSF sampling could be considered depending on clinical suspicion. Electronically Signed   By: Macy Mis M.D.   On: 04/30/2021 16:12    Procedures .Lumbar Puncture  Date/Time: 04/30/2021 7:29 PM Performed by: Rodney Booze, PA-C Authorized by: Rodney Booze, PA-C   Consent:    Consent obtained:  Verbal   Consent given by:  Patient and spouse   Risks, benefits, and alternatives were discussed: yes     Risks discussed:  Bleeding, infection and pain   Alternatives discussed:  No treatment Universal protocol:    Procedure explained and questions answered to patient or proxy's satisfaction: yes     Site/side marked: yes     Patient identity confirmed:  Verbally with patient Pre-procedure details:    Procedure purpose:  Diagnostic   Preparation: Patient was prepped and draped in usual sterile fashion   Anesthesia:    Anesthesia method:  Local infiltration   Local anesthetic:  Lidocaine 1% w/o epi Procedure details:    Lumbar space:  L4-L5 interspace   Patient position:  Sitting   Needle gauge:  24   Needle type:  Diamond point   Needle length (in):  2.5   Ultrasound guidance: no     Number of attempts:  3  Tubes of fluid: n/a.   Total volume  (ml): 0. Post-procedure details:    Puncture site:  Direct pressure applied and adhesive bandage applied   Procedure completion:  Procedure terminated electively by provider Comments:     Unsuccessful attempts   --------------------------------------------  7:00 PM Cardiac monitoring reveals NSR, HR 61 (Rate & rhythm), as reviewed and interpreted by me. Cardiac monitoring was ordered due to AMS, concern for CVA, and to monitor patient for dysrhythmia.  -------------------------------------   CRITICAL CARE Performed by: Rodney Booze   Total critical care time: 44 minutes  Critical care time was exclusive of separately billable procedures and treating other patients.  Critical care was necessary to treat or prevent imminent or life-threatening deterioration.  Critical care was time spent personally by me on the following activities: development of treatment plan with patient and/or surrogate as well as nursing, discussions with consultants, evaluation of patient's response to treatment, examination of patient, obtaining history from patient or surrogate, ordering and performing treatments and interventions, ordering and review of laboratory studies, ordering and review of radiographic studies, pulse oximetry and re-evaluation of patient's condition.   Medications Ordered in ED Medications  vancomycin (VANCOCIN) IVPB 1000 mg/200 mL premix (has no administration in time range)  cefTRIAXone (ROCEPHIN) 2 g in sodium chloride 0.9 % 100 mL IVPB (has no administration in time range)  ampicillin (OMNIPEN) 2 g in sodium chloride 0.9 % 100 mL IVPB (has no administration in time range)  acyclovir (ZOVIRAX) 930 mg in dextrose 5 % 250 mL IVPB (has no administration in time range)  vancomycin (VANCOCIN) IVPB 1000 mg/200 mL premix (has no administration in time range)  vancomycin (VANCOCIN) IVPB 1000 mg/200 mL premix (has no administration in time range)    ED Course  I have reviewed the  triage vital signs and the nursing notes.  Pertinent labs & imaging results that were available during my care of the patient were reviewed by me and considered in my medical decision making (see chart for details).  Clinical Course as of 04/30/21 1954  Fri Jun 24, 858  8251 65 year old here with acute on chronic confusion, concerning for encephalitis per clinical work-up.  CT scan did show some temporal lobe flareup again consistent with possible encephalitis.  Patient is pleasantly confused on exam.  He is compliant with commands.  His wife is present at bedside and says that he is off his baseline.  He has no other neurological deficits.  Based on MRI findings and discussion with neurology, we did attempt a bedside LP, were unsuccessful due to inability to introduce the needle into the spinal space.  We discussed with the neurologist again and decided on empiric coverage for both viral and bacterial meningitis.  IV antiviral  ordered as well as IV antibiotics.  COVID is negative.  Her white blood cell count was 11.9.  However he is afebrile and does not appear toxic on exam.  Anticipate transfer and admission at [facility] [MT]    Clinical Course User Index [MT] Wyvonnia Dusky, MD   MDM Rules/Calculators/A&P                          65 y/o M presenting for eval of acutely worsening confusion that started this after noon  Reviewed/interpreted labs CBC with mild leukocytosis, otherwise unremarkable CMP with mild hyponatremia, otherwise unremarkable ETOH neg Coags neg UA neg Uds neg Trop neg COVID neg  EKG - Sinus rhythm Borderline  prolonged PR interval Right bundle branch block Baseline wander in lead(s) V3   Reviewed/interpreted imaging CXR - No active cardiopulmonary disease. CT head -  Stable head CT without acute intracranial findings. Mild atrophy and chronic small vessel ischemic changes. MRI brain - Abnormal signal involving the medial left temporal lobe. Differential  considerations include subacute infarction, encephalitis (autoimmune or early changes of herpes), and seizure effect. There is no expansion or mass effect suggest glioma. Follow-up is recommended. CSF sampling could be considered depending on clinical suspicion.  5:38 PM CONSULT With Dr. Merlene Laughter with neurology who recommends LP. [facility]comm[facility] admission to [facility].  7:15 PM CONSULT with Dr. Leonel Ramsay with neurology at [facility] who recommends empiric abx including antivirals. Neuro will consult on pt at [facility].  65 year old male presenting for acutely altered mental status.  MRI concerning for encephalitis.  LP attempted here in the ED however was unsuccessful. He was started on empiric abx in the ED and will be admitted to the hospitalist service and transferred to [facility].  7:45 PM CONSULT with Dr. Waldron Labs who accepts patient for admission  Pt seen in conjunciton with Dr. Langston Masker who personally evaluated the patient and is in agreement with the plan.  Final Clinical Impression(s) / ED Diagnoses Final diagnoses:  Encephalitis    Rx / DC Orders ED Discharge Orders     None        Bishop Dublin 04/30/21 1954    Wyvonnia Dusky, MD 05/01/21 1125

## 2021-04-30 NOTE — Progress Notes (Signed)
Pharmacy Antibiotic Note  Justin [name] a 65 y.o. male admitted on 04/30/2021 with meningitis.  Pharmacy has been consulted for vancomycin dosing.  Plan: Vancomycin 1048m IV every 8 hours.  Goal trough 15-20 mcg/mL.  Medical History: Past Medical History:  Diagnosis Date   Anxiety    Diarrhea    Hypertension    OSA (obstructive sleep apnea)    cpap 9 cm H2O   Stroke (HCC)     Allergies:  No Known Allergies  Filed Weights   04/30/21 1303  Weight: 93 kg (205 lb)    CBC Latest Ref Rng & Units 04/30/2021 12/21/2020 09/26/2019  WBC 4.0 - 10.5 K/uL 11.9(H) 8.2 7.5  Hemoglobin 13.0 - 17.0 g/dL 15.9 15.3 16.8  Hematocrit 39.0 - 52.0 % 46.9 44.6 48.7  Platelets 150 - 400 K/uL 304 369 314     Estimated Creatinine Clearance: 84.9 mL/min (by C-G formula based on SCr of 0.98 mg/dL).  Antibiotics Given (last 72 hours)     None       Antimicrobials this admission:  vancomycin 04/30/2021  >>  Ampicillin 04/30/2021 >> Ceftriaxone 04/30/2021 >> Acyclovir 04/30/2021 >>  Microbiology results: 04/30/2021  BCx: sent 04/30/2021  UCx: sent 04/30/2021  Resp Panel: sent  04/30/2021  MRSA PCR: sent  Thank you for allowing pharmacy to be a part of this patient's care.  GThomasenia Sales PharmD Clinical Pharmacist

## 2021-04-30 NOTE — ED Notes (Signed)
Attempted to get a urine but pt's wife stated he used the urinal and then poured the urine out due to confusion.

## 2021-05-01 ENCOUNTER — Inpatient Hospital Stay (HOSPITAL_COMMUNITY): Payer: Medicare Other

## 2021-05-01 DIAGNOSIS — G0490 Encephalitis and encephalomyelitis, unspecified: Principal | ICD-10-CM

## 2021-05-01 DIAGNOSIS — G9341 Metabolic encephalopathy: Secondary | ICD-10-CM

## 2021-05-01 LAB — CBC
HCT: 41.6 % (ref 39.0–52.0)
Hemoglobin: 13.9 g/dL (ref 13.0–17.0)
MCH: 32.3 pg (ref 26.0–34.0)
MCHC: 33.4 g/dL (ref 30.0–36.0)
MCV: 96.5 fL (ref 80.0–100.0)
Platelets: 290 10*3/uL (ref 150–400)
RBC: 4.31 MIL/uL (ref 4.22–5.81)
RDW: 12.5 % (ref 11.5–15.5)
WBC: 13.2 10*3/uL — ABNORMAL HIGH (ref 4.0–10.5)
nRBC: 0 % (ref 0.0–0.2)

## 2021-05-01 LAB — COMPREHENSIVE METABOLIC PANEL
ALT: 20 U/L (ref 0–44)
AST: 20 U/L (ref 15–41)
Albumin: 3.2 g/dL — ABNORMAL LOW (ref 3.5–5.0)
Alkaline Phosphatase: 64 U/L (ref 38–126)
Anion gap: 8 (ref 5–15)
BUN: 15 mg/dL (ref 8–23)
CO2: 24 mmol/L (ref 22–32)
Calcium: 8.4 mg/dL — ABNORMAL LOW (ref 8.9–10.3)
Chloride: 104 mmol/L (ref 98–111)
Creatinine, Ser: 0.97 mg/dL (ref 0.61–1.24)
GFR, Estimated: >60 mL/min (ref >60)
Glucose, Bld: 102 mg/dL — ABNORMAL HIGH (ref 70–99)
Potassium: 4.1 mmol/L (ref 3.5–5.1)
Sodium: 136 mmol/L (ref 135–145)
Total Bilirubin: 1.2 mg/dL (ref 0.3–1.2)
Total Protein: 5.7 g/dL — ABNORMAL LOW (ref 6.5–8.1)

## 2021-05-01 LAB — CSF CELL COUNT WITH DIFFERENTIAL
RBC Count, CSF: 0 /mm3
Tube #: 1
WBC, CSF: 3 /mm3 (ref 0–5)

## 2021-05-01 LAB — PROTEIN AND GLUCOSE, CSF
Glucose, CSF: 76 mg/dL — ABNORMAL HIGH (ref 40–70)
Total  Protein, CSF: 30 mg/dL (ref 15–45)

## 2021-05-01 LAB — HIV ANTIBODY (ROUTINE TESTING W REFLEX): HIV Screen 4th Generation wRfx: NONREACTIVE

## 2021-05-01 MED ORDER — SODIUM CHLORIDE 0.9 % IV SOLN: INTRAVENOUS | Status: AC

## 2021-05-01 MED ORDER — LEVETIRACETAM 500 MG PO TABS
500.0000 mg | ORAL_TABLET | Freq: Two times a day (BID) | ORAL | Status: DC
  Administered 2021-05-01 – 2021-05-05 (×8): 500 mg via ORAL
  Filled 2021-05-01 (×8): qty 1

## 2021-05-01 MED ORDER — GADOBUTROL 1 MMOL/ML IV SOLN
9.0000 mL | Freq: Once | INTRAVENOUS | Status: AC | PRN
  Administered 2021-05-01: 9 mL via INTRAVENOUS

## 2021-05-01 MED ORDER — MESALAMINE 400 MG PO CPDR
800.0000 mg | DELAYED_RELEASE_CAPSULE | Freq: Two times a day (BID) | ORAL | Status: DC
Start: 2021-05-01 — End: 2021-05-05
  Administered 2021-05-01 – 2021-05-05 (×9): 800 mg via ORAL
  Filled 2021-05-01 (×10): qty 2

## 2021-05-01 MED ORDER — SODIUM CHLORIDE 0.9 % IV SOLN
2.0000 g | INTRAVENOUS | Status: DC
  Administered 2021-05-01 (×2): 2 g via INTRAVENOUS
  Filled 2021-05-01 (×7): qty 2000

## 2021-05-01 NOTE — Progress Notes (Signed)
EEG completed, results pending. 

## 2021-05-01 NOTE — Progress Notes (Signed)
Pt pulled out electrodes, bo cuff and pulse oximeter,pt said he was ready to go home now, re orient pt, and pt will agree not remove anything,  pt can tell name and birthday and other names related to pt, but he do not know where he is and what he is doing here, mittens applied and explained the benefit of mittens for pt safety, and pt agreed, but pt managed to removed it, IV cannula was also removed,

## 2021-05-01 NOTE — Progress Notes (Signed)
Brief Neuro Update:  Preliminary CSF studies are not concerning for bacterial meningitis. I am going to discontinue Vanc, Ceftriaxone and Ampicillin. Continue Acyclovir while HSV PCR is pending.  [place] Pager Number [phone]

## 2021-05-01 NOTE — Procedures (Signed)
Routine EEG Report  [name] is a 65 y.o. male with a history of encephalopathy who is undergoing an EEG to evaluate for seizures.  Report: This EEG was acquired with electrodes placed according to the International 10-20 electrode system (including Fp1, Fp2, F3, F4, C3, C4, P3, P4, O1, O2, T3, T4, T5, T6, A1, A2, Fz, Cz, Pz). The following electrodes were missing or displaced: none.  The occipital dominant rhythm was 7-8 Hz with overriding beta frequencies. This activity is reactive to stimulation. Drowsiness was manifested by background fragmentation; deeper stages of sleep were not identified. There was focal slowing over the left frontotemporal region. There were no interictal epileptiform discharges. There were no electrographic seizures identified. Photic stimulation and hyperventilation were not performed.   Impression: This EEG was obtained while awake and drowsy and is abnormal due to  - mild diffuse slowing indicating global cerebral dysfunction - focal slowing indicating superimposed focal dysfunction in the left frontotemporal region.   Su Monks, MD [facility] [phone]  If 7pm- 7am, please page neurology on call as listed in [place].

## 2021-05-01 NOTE — Progress Notes (Signed)
BERTHEL BAGNALL [id number] Admission Data: 05/01/2021 1:08 AM Attending Provider: Albertine Patricia, MD  IWL:[id number], [name], MD Consults/ Treatment Team:   Justin [name] is a 65 y.o. male patient admitted from ED awake, alert  & orientated  X 3,  Full Code, VSS. No c/o shortness of breath, no c/o chest pain, no distress noted. Tele # [id number] placed and pt is currently running:normal sinus rhythm.   Pt orientation to unit, room and routine. Information packet given to patient/family and safety video watched.  Admission INP armband ID verified with patient/family, and in place. SR up x 2, fall risk assessment complete with Patient and family verbalizing understanding of risks associated with falls. Pt verbalizes an understanding of how to use the call bell and to call for help before getting out of bed.  Skin, clean-dry- intact without evidence of bruising, or skin tears.   No evidence of skin break down noted on exam.    Will cont to monitor and assist as needed.  Hiram Comber, RN 05/01/2021 1:08 AM

## 2021-05-01 NOTE — Procedures (Signed)
Uncomplicated LP at 5/1.  92FC OP.  30cc clear fluid collected.    JWatts MD

## 2021-05-01 NOTE — Progress Notes (Signed)
PROGRESS NOTE    Justin [name]  HEN:[id number] DOB: [birth year] DOA: 04/30/2021 PCP: Justin Frizzle, MD   Brief Narrative:  HPI On 04/30/2021 by Dr. Phillips Climes Justin [name]  is a 65 y.o. male, with past medical history of anxiety, hypertension, OSA on CPAP, CVA, patient was brought by his wife for evaluation for consult mental status, wife report she left husband this morning at 9 AM with normal state of health, when she came back later in the day was significantly confused, wife report overall patient has some memory problems over the last few month, but has been mild, and they have worsened after stroke last year, as well after COVID infection last year, but overall he was still working(but he had to cut significantly to his mobility issues over the last few month), he was driving until yesterday, but today he is much more confused, and she was not not able to have any meaningful conversation with him, so she brought him to ED. - in ED patient is afebrile, with no meningeal signs, but his MRI did show abnormal signal involving the mesial left temporal lobe, with differential including subacute infarction, encephalitis and seizure, LP was unsuccessful in ED, patient was started empirically on acyclovir, ampicillin, Rocephin and vancomycin, received 1 mg of IV Ativan without significant improvement of mental status, but he became more sleepy after receiving it, [facility] consulted to admit.  Assessment & Plan   Acute encephalopathy -Patient presented with confusion more so than he has been.  Per wife, he is supposed to follow-up with neurology for mental status evaluation in August. -MRI brain showed significant abnormal signal in the left temporal lobe, differential includes subacute infarction, encephalitis versus seizure -Currently patient is alert and oriented x3 -Per wife, he does appear to be improved -Patient with no neurological focal deficits at this time -IR consulted for LP  (LP attempted in the ED was unsuccessful) -Patient placed on antibiotics, ampicillin, ceftriaxone and vancomycin as well as acyclovir -Pending EEG -Neurology consulted and appreciated  Essential hypertension -Antihypertensives currently held (metoprolol, HCTZ and losartan) -Norvasc was recently discontinued by his PCP  Obstructive sleep apnea -Continue CPAP  History of CVA  History of ulcerative colitis -Continue mesalamine  DVT Prophylaxis SCDs  Code Status: Full  Family Communication: Wife at bedside  Disposition Plan:  Status is: Inpatient  Remains inpatient appropriate because:Altered mental status, Ongoing diagnostic testing needed not appropriate for outpatient work up, and IV treatments appropriate due to intensity of illness or inability to take PO  Dispo: The patient is from: Home              Anticipated d/c is to: Home              Patient currently is not medically stable to d/c.   Difficult to place patient No   Consultants Neurology Interventional radiology  Procedures  Lumbar puncture  Antibiotics   Anti-infectives (From admission, onward)    Start     Dose/Rate Route Frequency Ordered Stop   05/01/21 1200  ampicillin (OMNIPEN) 2 g in sodium chloride 0.9 % 100 mL IVPB        2 g 300 mL/hr over 20 Minutes Intravenous Every 4 hours 05/01/21 0806     05/01/21 0400  vancomycin (VANCOCIN) IVPB 1000 mg/200 mL premix        1,000 mg 200 mL/hr over 60 Minutes Intravenous Every 8 hours 04/30/21 1942     04/30/21 2000  ampicillin (OMNIPEN) 2  g in sodium chloride 0.9 % 100 mL IVPB  Status:  Discontinued        2 g 300 mL/hr over 20 Minutes Intravenous Every 4 hours 04/30/21 1918 05/01/21 0806   04/30/21 1945  vancomycin (VANCOCIN) IVPB 1000 mg/200 mL premix        1,000 mg 200 mL/hr over 60 Minutes Intravenous  Once 04/30/21 1940 04/30/21 2230   04/30/21 1930  vancomycin (VANCOCIN) IVPB 1000 mg/200 mL premix        1,000 mg 200 mL/hr over 60 Minutes  Intravenous  Once 04/30/21 1918 04/30/21 2216   04/30/21 1930  cefTRIAXone (ROCEPHIN) 2 g in sodium chloride 0.9 % 100 mL IVPB        2 g 200 mL/hr over 30 Minutes Intravenous Every 12 hours 04/30/21 1918     04/30/21 1930  acyclovir (ZOVIRAX) 930 mg in dextrose 5 % 250 mL IVPB        10 mg/kg  93 kg 268.6 mL/hr over 60 Minutes Intravenous Every 8 hours 04/30/21 1919         Subjective:   Justin [name] seen and examined today.  Patient with no complaints this morning.  Denies current chest pain, shortness of breath, abdominal pain, nausea or vomiting, diarrhea constipation, dizziness or headache.    Objective:   Vitals:   05/01/21 0310 05/01/21 0445 05/01/21 0804 05/01/21 1211  BP: 108/75 95/69 104/60 118/79  Pulse: (!) 53 (!) 52 (!) 52 63  Resp: 17 18 15 17   Temp: 98 F (36.7 C) 98.3 F (36.8 C) 98.1 F (36.7 C) 98.2 F (36.8 C)  TempSrc: Oral Axillary Oral Oral  SpO2: 97% 93% 97% 98%  Weight:      Height:        Intake/Output Summary (Last 24 hours) at 05/01/2021 1404 Last data filed at 05/01/2021 0900 Gross per 24 hour  Intake 1095.69 ml  Output 300 ml  Net 795.69 ml   Filed Weights   04/30/21 1303  Weight: 93 kg    Exam General: Well developed, well nourished, NAD, appears stated age HEENT: NCAT, PERRLA, EOMI, Anicteic Sclera, mucous membranes moist.  Neck: Supple, no JVD, no masses Cardiovascular: S1 S2 auscultated, no rubs, murmurs or gallops. Regular rate and rhythm. Respiratory: Clear to auscultation bilaterally with equal chest rise Abdomen: Soft, nontender, nondistended, + bowel sounds Extremities: warm dry without cyanosis clubbing or edema Neuro: AAOx3, cranial nerves grossly intact. Strength 5/5 in patient's upper and lower extremities bilaterally Skin: Without rashes exudates or nodules Psych: Normal affect and demeanor with intact judgement and insight   Data Reviewed: I have personally reviewed following labs and imaging studies  CBC: Recent  Labs  Lab 04/30/21 1344 05/01/21 0136  WBC 11.9* 13.2*  NEUTROABS 10.3*  --   HGB 15.9 13.9  HCT 46.9 41.6  MCV 96.9 96.5  PLT 304 619   Basic Metabolic Panel: Recent Labs  Lab 04/30/21 1344 05/01/21 0136  NA 134* 136  K 3.7 4.1  CL 103 104  CO2 25 24  GLUCOSE 128* 102*  BUN 18 15  CREATININE 0.98 0.97  CALCIUM 9.2 8.4*   GFR: Estimated Creatinine Clearance: 85.8 mL/min (by C-G formula based on SCr of 0.97 mg/dL). Liver Function Tests: Recent Labs  Lab 04/30/21 1344 05/01/21 0136  AST 30 20  ALT 32 20  ALKPHOS 92 64  BILITOT 1.2 1.2  PROT 7.3 5.7*  ALBUMIN 4.2 3.2*   No results for input(s): LIPASE, AMYLASE in  the last 168 hours. No results for input(s): AMMONIA in the last 168 hours. Coagulation Profile: Recent Labs  Lab 04/30/21 1344  INR 1.1   Cardiac Enzymes: No results for input(s): CKTOTAL, CKMB, CKMBINDEX, TROPONINI in the last 168 hours. BNP (last 3 results) No results for input(s): PROBNP in the last 8760 hours. HbA1C: No results for input(s): HGBA1C in the last 72 hours. CBG: No results for input(s): GLUCAP in the last 168 hours. Lipid Profile: No results for input(s): CHOL, HDL, LDLCALC, TRIG, CHOLHDL, LDLDIRECT in the last 72 hours. Thyroid Function Tests: No results for input(s): TSH, T4TOTAL, FREET4, T3FREE, THYROIDAB in the last 72 hours. Anemia Panel: No results for input(s): VITAMINB12, FOLATE, FERRITIN, TIBC, IRON, RETICCTPCT in the last 72 hours. Urine analysis:    Component Value Date/Time   COLORURINE YELLOW 04/30/2021 1709   APPEARANCEUR CLEAR 04/30/2021 1709   LABSPEC 1.015 04/30/2021 1709   PHURINE 6.0 04/30/2021 1709   GLUCOSEU NEGATIVE 04/30/2021 1709   HGBUR NEGATIVE 04/30/2021 1709   BILIRUBINUR NEGATIVE 04/30/2021 1709   [place] 04/30/2021 1709   PROTEINUR NEGATIVE 04/30/2021 1709   NITRITE NEGATIVE 04/30/2021 1709   LEUKOCYTESUR NEGATIVE 04/30/2021 1709   Sepsis  Labs: @LABRCNTIP (procalcitonin:4,lacticidven:4)  ) Recent Results (from the past 240 hour(s))  Resp Panel by RT-PCR (Flu A&B, Covid) Nasopharyngeal Swab     Status: None   Collection Time: 04/30/21  5:15 PM   Specimen: Nasopharyngeal Swab; Nasopharyngeal(NP) swabs in vial transport medium  Result Value Ref Range Status   SARS Coronavirus 2 by RT PCR NEGATIVE NEGATIVE Final    Comment: (NOTE) SARS-CoV-2 target nucleic acids are NOT DETECTED.  The SARS-CoV-2 RNA is generally detectable in upper respiratory specimens during the acute phase of infection. The lowest concentration of SARS-CoV-2 viral copies this assay can detect is 138 copies/mL. A negative result does not preclude SARS-Cov-2 infection and should not be used as the sole basis for treatment or other patient management decisions. A negative result may occur with  improper specimen collection/handling, submission of specimen other than nasopharyngeal swab, presence of viral mutation(s) within the areas targeted by this assay, and inadequate number of viral copies(<138 copies/mL). A negative result must be combined with clinical observations, patient history, and epidemiological information. The expected result is Negative.  Fact Sheet for Patients:  EntrepreneurPulse.com.au  Fact Sheet for Healthcare Providers:  IncredibleEmployment.be  This test is no t yet approved or cleared by the [place] FDA and  has been authorized for detection and/or diagnosis of SARS-CoV-2 by FDA under an Emergency Use Authorization (EUA). This EUA will remain  in effect (meaning this test can be used) for the duration of the COVID-19 declaration under Section 564(b)(1) of the Act, 21 U.S.C.section 360bbb-3(b)(1), unless the authorization is terminated  or revoked sooner.       Influenza A by PCR NEGATIVE NEGATIVE Final   Influenza B by PCR NEGATIVE NEGATIVE Final    Comment: (NOTE) The Xpert  Xpress SARS-CoV-2/FLU/RSV plus assay is intended as an aid in the diagnosis of influenza from Nasopharyngeal swab specimens and should not be used as a sole basis for treatment. Nasal washings and aspirates are unacceptable for Xpert Xpress SARS-CoV-2/FLU/RSV testing.  Fact Sheet for Patients: EntrepreneurPulse.com.au  Fact Sheet for Healthcare Providers: IncredibleEmployment.be  This test is not yet approved or cleared by the [place] FDA and has been authorized for detection and/or diagnosis of SARS-CoV-2 by FDA under an Emergency Use Authorization (EUA). This EUA will remain in effect (meaning  this test can be used) for the duration of the COVID-19 declaration under Section 564(b)(1) of the Act, 21 U.S.C. section 360bbb-3(b)(1), unless the authorization is terminated or revoked.  Performed at [facility], [address] [place], [place] [address]   Blood culture (routine x 2)     Status: None (Preliminary result)   Collection Time: 04/30/21  6:19 PM   Specimen: BLOOD RIGHT ARM  Result Value Ref Range Status   Specimen Description BLOOD RIGHT ARM  Final   Special Requests   Final    Blood Culture adequate volume BOTTLES DRAWN AEROBIC AND ANAEROBIC   Culture   Final    NO GROWTH < 12 HOURS Performed at [facility], [address], [place], [place] [address]    Report Status PENDING  Incomplete  Blood culture (routine x 2)     Status: None (Preliminary result)   Collection Time: 04/30/21  6:29 PM   Specimen: Left Antecubital; Blood  Result Value Ref Range Status   Specimen Description LEFT ANTECUBITAL  Final   Special Requests   Final    Blood Culture results may not be optimal due to an excessive volume of blood received in culture bottles BOTTLES DRAWN AEROBIC AND ANAEROBIC   Culture   Final    NO GROWTH < 12 HOURS Performed at [facility], [address], [place], [place] [address]    Report Status PENDING  Incomplete  CSF  culture w Gram Stain     Status: None (Preliminary result)   Collection Time: 04/30/21 11:33 PM   Specimen: CSF; Cerebrospinal Fluid  Result Value Ref Range Status   Specimen Description CSF  Final   Special Requests Normal  Final   Gram Stain   Final    WBC PRESENT,BOTH PMN AND MONONUCLEAR NO ORGANISMS SEEN CYTOSPIN SMEAR Performed at [facility], [place] [address], [place], [place] [address]    Culture PENDING  Incomplete   Report Status PENDING  Incomplete      Radiology Studies: DG Chest 2 View  Result Date: 04/30/2021 CLINICAL DATA:  Cough and altered mental status. EXAM: CHEST - 2 VIEW COMPARISON:  Chest x-ray dated December 02, 2020. FINDINGS: The heart size and mediastinal contours are within normal limits. Both lungs are clear. The visualized skeletal structures are unremarkable. IMPRESSION: No active cardiopulmonary disease. Electronically Signed   By: Titus Dubin M.D.   On: 04/30/2021 14:59   CT Head Wo Contrast  Result Date: 04/30/2021 CLINICAL DATA:  Mental status change with increased confusion. History of stroke 3 years ago. EXAM: CT HEAD WITHOUT CONTRAST TECHNIQUE: Contiguous axial images were obtained from the base of the skull through the vertex without intravenous contrast. COMPARISON:  CT head 04/13/2018.  Head MRI 05/29/2020. FINDINGS: Brain: There is no evidence of acute intracranial hemorrhage, mass lesion, brain edema or extra-axial fluid collection. Stable mild atrophy with mild prominence of the ventricles and subarachnoid spaces. Stable mild chronic small vessel ischemic changes in the periventricular white matter. There is no CT evidence of acute cortical infarction. Vascular: Intracranial vascular calcifications. No hyperdense vessel identified. Skull: Negative for fracture or suspicious focal lesion. Stable benign lucencies in the frontal bone bilaterally. Sinuses/Orbits: The visualized paranasal sinuses and mastoid air cells are clear. No orbital  abnormalities are seen. Other: None. IMPRESSION: Stable head CT without acute intracranial findings. Mild atrophy and chronic small vessel ischemic changes. Electronically Signed   By: Richardean Sale M.D.   On: 04/30/2021 14:05   MR  BRAIN WO CONTRAST  Result Date: 04/30/2021 CLINICAL DATA:  Increasing memory problems EXAM: MRI HEAD WITHOUT CONTRAST TECHNIQUE: Multiplanar, multiecho pulse sequences of the brain and surrounding structures were obtained without intravenous contrast. COMPARISON:  Images only 05/29/2020.  Dictation by non-radiologist. FINDINGS: Brain: There is abnormal diffusion hyperintensity along the head and body of the left hippocampus. Probable asymmetric involvement of the uncus as well. Suspect subtle corresponding T2 FLAIR hyperintensity. No mass effect. No evidence of intracranial hemorrhage. Patchy additional foci of T2 hyperintensity in the supratentorial white matter are nonspecific but may reflect minor chronic microvascular ischemic changes. Prominence of the ventricles and sulci reflects generalized parenchymal volume loss. There is no hydrocephalus or extra-axial fluid collection. Vascular: Major vessel flow voids at the skull base are preserved. Skull and upper cervical spine: Normal marrow signal is preserved. Sinuses/Orbits: Paranasal sinuses are aerated. Orbits are unremarkable. Other: Sella is unremarkable.  Mastoid air cells are clear. IMPRESSION: Abnormal signal involving the mesial left temporal lobe. Differential considerations include subacute infarction, encephalitis (autoimmune or early changes of herpes), and seizure effect. There is no expansion or mass effect suggest glioma. Follow-up is recommended. CSF sampling could be considered depending on clinical suspicion. Electronically Signed   By: Macy Mis M.D.   On: 04/30/2021 16:12   DG FL GUIDED LUMBAR PUNCTURE  Result Date: 05/01/2021 CLINICAL DATA:  Altered mental status and question of encephalitis. EXAM:  DIAGNOSTIC LUMBAR PUNCTURE UNDER FLUOROSCOPIC GUIDANCE COMPARISON:  Preceding brain MRI FLUOROSCOPY TIME:  Fluoroscopy Time:  0.5 minutes Radiation Exposure Index (if provided by the fluoroscopic device): 2.9 mGy air kerma Number of Acquired Spot Images: 0 PROCEDURE: Informed consent was obtained from the patient and his wife prior to the procedure. The patient was able to sign for himself. With the patient prone, the lower back was prepped with Betadine. 1% Lidocaine was used for local anesthesia. Lumbar puncture was performed at the L5-S1 level using a 20 gauge needle with return of clear CSF with an opening pressure of 17 cm water. As requested, 30 ml of CSF were obtained for laboratory studies. The patient tolerated the procedure well and there were no apparent complications. IMPRESSION: Uncomplicated lumbar puncture. Electronically Signed   By: Monte Fantasia M.D.   On: 05/01/2021 11:15     Scheduled Meds:  atorvastatin  40 mg Oral Daily   Mesalamine  800 mg Oral BID   Continuous Infusions:  sodium chloride 50 mL/hr at 05/01/21 0014   sodium chloride 100 mL/hr at 04/30/21 2132   acyclovir 930 mg (05/01/21 0521)   ampicillin (OMNIPEN) IV 2 g (05/01/21 1126)   cefTRIAXone (ROCEPHIN)  IV 2 g (05/01/21 0622)   vancomycin 1,000 mg (05/01/21 1156)     LOS: 1 day   Time Spent in minutes   45 minutes  Timya Trimmer D.O. on 05/01/2021 at 2:04 PM  Between 7am to 7pm - Please see pager noted on [email or web address]  After 7pm go to [email or web address]  And look for the night coverage person covering for me after hours  [facility] Office  [phone]

## 2021-05-01 NOTE — Consult Note (Signed)
Neurology Consultation Reason for Consult: AMS Referring Physician: Elgergawy, D  CC: AMS  History is obtained from:PAtient, Chart  HPI: Justin [name] is a 65 y.o. male with a history of previous stroke who was in his normal state of health when his wife left around 9 AM.  Which came back later in the day, she found him to be significantly confused.  He had remained persistently confused, unable to have meaningful conversation.  Due to this she brought him in to be evaluated in the emergency department where he remained persistently confused when evaluated by hospitalist later in the evening.  He apparently could not give his name, or answer anything meaningfully at that time.  He had an MRI which demonstrates some mesial temporal hyperintensity on T2 imaging.  Due to this finding, he was started on IV acyclovir and an LP was attempted.  He was transferred down to [facility] for further evaluation.  Apparently, he has been having waxing/waning of his mental status over the past few weeks, but no clear definite seizure activity.  ROS: A 14 point ROS was performed and is negative except as noted in the HPI.  Past Medical History:  Diagnosis Date   Anxiety    Diarrhea    Hypertension    OSA (obstructive sleep apnea)    cpap 9 cm H2O   Stroke [facility])      Family History  Problem Relation Age of Onset   Tuberculosis Mother    Stroke Mother    Heart disease Father        died at 32   Bradycardia Brother        pacemaker   Stroke Brother    Cancer Paternal Uncle    Mental illness Paternal Grandfather        suicide at 51   Colon cancer Neg Hx      Social History:  reports that he has never smoked. He has never used smokeless tobacco. He reports that he does not drink alcohol and does not use drugs.   Exam: Current vital signs: BP 108/75 (BP Location: Right Arm)   Pulse (!) 53   Temp 98 F (36.7 C) (Oral)   Resp 17   Ht 6' 1"  (1.854 m)   Wt 93 kg   SpO2 97%   BMI 27.05  kg/m  Vital signs in last 24 hours: Temp:  [97.8 F (36.6 C)-98 F (36.7 C)] 98 F (36.7 C) (06/25 0310) Pulse Rate:  [48-66] 53 (06/25 0310) Resp:  [14-27] 17 (06/25 0310) BP: (92-169)/(56-94) 108/75 (06/25 0310) SpO2:  [89 %-100 %] 97 % (06/25 0310) Weight:  [93 kg] 93 kg (06/24 1303)   Physical Exam  Constitutional: Appears well-developed and well-nourished.  Psych: Affect appropriate to situation Eyes: No scleral injection HENT: No OP obstruction MSK: no joint deformities.  Cardiovascular: Normal rate and regular rhythm.  Respiratory: Effort normal, non-labored breathing GI: Soft.  No distension. There is no tenderness.  Skin: WDI  Neuro: Mental Status: Patient is awake, alert, oriented to person, place, month, year, and knows that he is in the hospital, but not which one He is unable to spell world backwards("d-l-o-r-d") and is unable to get the number of quarters in $2.75. He converses fluently and is able to name simple objects such as fingers and keys/carrying Cranial Nerves: II: Visual Fields are full. Pupils are equal, round, and reactive to light.   III,IV, VI: EOMI without ptosis or diploplia.  V: Facial sensation is  symmetric to temperature VII: Facial movement is symmetric.  VIII: hearing is intact to voice X: Uvula elevates symmetrically XI: Shoulder shrug is symmetric. XII: tongue is midline without atrophy or fasciculations.  Motor: Tone is normal. Bulk is normal. 5/5 strength was present in all four extremities.  Sensory: Sensation is symmetric to light touch and temperature in the arms and legs. Cerebellar: No clear ataxia     I have reviewed labs in epic and the results pertinent to this consultation are: Ca corrects to 9.0 Na 136 Cr 0.97 WBC 13.2  I have reviewed the images obtained:MRI brain - hyperintensity in the mesial temporal lobe on the left   Impression: 65 year old male with intermittent episodes of altered mental status who  presented with persistent altered mental status today.  He has had marked improvement since evaluation initially at [facility].  I do wonder if he was in status epilepticus which is resolved with Ativan administration.  His improvement would argue against herpes, but with the imaging findings I still think that CSF sampling would be prudent.  I think that bacterial meningitis is much less likely.  Recommendations: 1) EEG 2) would consider Keppra 500 twice daily after EEG 3) LP for cells, culture, glucose, protein, acyclovir 4) continue acyclovir pending LP   Roland Rack, MD [facility] [phone]  If 7pm- 7am, please page neurology on call as listed in [place].

## 2021-05-02 LAB — TSH: TSH: 1.334 u[IU]/mL (ref 0.350–4.500)

## 2021-05-02 LAB — FOLATE: Folate: 9.7 ng/mL (ref >5.9)

## 2021-05-02 LAB — AMMONIA: Ammonia: 19 umol/L (ref 9–35)

## 2021-05-02 LAB — VITAMIN B12: Vitamin B-12: 279 pg/mL (ref 180–914)

## 2021-05-02 MED ORDER — THIAMINE HCL 100 MG/ML IJ SOLN: 100.0000 mg | Freq: Every day | INTRAMUSCULAR | Status: DC

## 2021-05-02 MED ORDER — LORAZEPAM 2 MG/ML IJ SOLN: 1.0000 mg | INTRAMUSCULAR | Status: DC | PRN

## 2021-05-02 MED ORDER — VITAMIN B-12 1000 MCG PO TABS: 1000.0000 ug | ORAL_TABLET | Freq: Every day | ORAL | Status: DC

## 2021-05-02 MED ORDER — CYANOCOBALAMIN 1000 MCG/ML IJ SOLN
1000.0000 ug | Freq: Every day | INTRAMUSCULAR | Status: DC
  Administered 2021-05-02 – 2021-05-05 (×4): 1000 ug via INTRAMUSCULAR
  Filled 2021-05-02 (×4): qty 1

## 2021-05-02 MED ORDER — HALOPERIDOL LACTATE 5 MG/ML IJ SOLN
2.5000 mg | Freq: Once | INTRAMUSCULAR | Status: AC
  Administered 2021-05-02: 2.5 mg via INTRAVENOUS
  Filled 2021-05-02: qty 1

## 2021-05-02 MED ORDER — THIAMINE HCL 100 MG PO TABS
100.0000 mg | ORAL_TABLET | Freq: Every day | ORAL | Status: DC
  Administered 2021-05-02 – 2021-05-05 (×4): 100 mg via ORAL
  Filled 2021-05-02 (×4): qty 1

## 2021-05-02 MED ORDER — LORAZEPAM 2 MG/ML IJ SOLN: 1.0000 mg | Freq: Once | INTRAMUSCULAR | Status: DC | PRN

## 2021-05-02 NOTE — Progress Notes (Signed)
RT placed patient on CPAP HS. Patient on home unit. No O2 bleed in needed.

## 2021-05-02 NOTE — Progress Notes (Signed)
Ms Justin [name] came, pt can recognized the wife and pt followed the instruction of the wife, cardiac monitor put back on together with the IVF, pt lie on bed and advised to sleep, CN Nikki aware that pt wife will stay the whole shift

## 2021-05-02 NOTE — Progress Notes (Signed)
DR Olevia Bowens, talked with pt and the wife and Ms [name] agreed to give the treatment needed by the pt, Dr Olevia Bowens allowed Ms [name] to come and see the pt in brief time that might help to calm down the pt

## 2021-05-02 NOTE — Progress Notes (Signed)
Neurology Progress Note  S: Overnight, patient became more confused and agitated. Security had to be called, wife came in, and patient was given Haldol. Wife at bedside states he has had difficulties with short term memory loss at home for awhile. He can not tell NP what he had for breakfast today.   He tells NP that he still doesn't feel like he is running on all cylinders. When asked to clarify what is specifically wrong, he states, "well, ya know, sometimes things take longer, like three hours, and sometimes you have to change".  O: Current vital signs: BP 116/77 (BP Location: Right Arm)   Pulse 63   Temp 98.8 F (37.1 C) (Oral)   Resp 20   Ht 6' 1"  (1.854 m)   Wt 93 kg   SpO2 95%   BMI 27.05 kg/m  Vital signs in last 24 hours: Temp:  [98.2 F (36.8 C)-98.8 F (37.1 C)] 98.8 F (37.1 C) (06/26 0723) Pulse Rate:  [56-64] 63 (06/26 0723) Resp:  [15-20] 20 (06/26 0723) BP: (110-123)/(71-81) 116/77 (06/26 0723) SpO2:  [94 %-98 %] 95 % (06/26 0723)  GENERAL: Awake, alert in NAD. HEENT: Normocephalic and atraumatic. LUNGS: Normal respiratory effort.  Ext: warm. Psych: His affect is somewhat incongruent. He jokes a lot when he can not answer a question.   NEURO:  Mental Status: Alert. Oriented to self, wife, place, city, state, and year. Not oriented to day or date. He looks to his wife a lot for answers. He can spell WORLD backwards, but can not tell NP how many quarters are in $2.75. He replies, there are how ever many quarters it takes.  Speech/Language: speech is without aphasia or dysarthria.  Naming, repetition, fluency, and comprehension intact. He is able to tell NP what a watch is and that it tells time. He knows a pen is for writing. He seems to joke a lot when he can not think of an answer.  Cranial Nerves:  II: PERRL. Visual fields full.  III, IV, VI: EOMI.  V: Sensation is intact to light touch and symmetrical to face.  VII: Smile is symmetrical.   VIII: hearing  intact to voice. IX, X: Palate elevates symmetrically. Phonation is normal.  OX:BDZHGDJM shrug 5/5. XII: tongue is midline without fasciculations. Motor: 5/5 strength to all muscle groups tested.  Tone: is normal and bulk is normal Sensation- Intact to light touch bilaterally. Extinction absent to light touch to DSS.    Coordination: FTN intact bilaterally, HKS: no ataxia in BLE. No drift.  DTRs: 2+ throughout Gait- deferred  Medications  Current Facility-Administered Medications:    0.9 %  sodium chloride infusion, , Intravenous, Continuous, [place]ger[place]awy, Silver Huguenin, MD, Last Rate: 100 mL/hr at 05/01/21 2000, Rate Verify at 05/01/21 2000   acyclovir (ZOVIRAX) 930 mg in dextrose 5 % 250 mL IVPB, 10 mg/kg, Intravenous, Q8H, Mikhail, [place], DO, Last Rate: 268.6 mL/hr at 05/02/21 0554, 930 mg at 05/02/21 0554   atorvastatin (LIPITOR) tablet 40 mg, 40 mg, Oral, Daily, Cristal Ford, DO, 40 mg at 05/02/21 0919   levETIRAcetam (KEPPRA) tablet 500 mg, 500 mg, Oral, BID, Greta Doom, MD, 500 mg at 05/02/21 0919   LORazepam (ATIVAN) injection 1 mg, 1 mg, Intravenous, Once PRN, Reubin Milan, MD   Mesalamine (ASACOL) DR capsule 800 mg, 800 mg, Oral, BID, Cristal Ford, DO, 800 mg at 05/02/21 0919  Pertinent Labs Results for VIRAJ, LIBY (MRN [id number]) as of 05/02/2021 11:21  Ref. Range 05/01/2021  11:20  Appearance, CSF Latest Ref Range: CLEAR  CLEAR  Glucose, CSF Latest Ref Range: 40 - 70 mg/dL 76 (H)  RBC Count, CSF Latest Ref Range: 0 /cu mm 0  WBC, CSF Latest Ref Range: 0 - 5 /cu mm 3  Other Cells, CSF Unknown TOO FEW TO COUNT, SMEAR AVAILABLE FOR REVIEW  Color, CSF Latest Ref Range: COLORLESS  COLORLESS  Supernatant Unknown NOT INDICATED  Total  Protein, CSF Latest Ref Range: 15 - 45 mg/dL 30  Tube # Unknown 1  CSF culture and gram stain negative.  HSV pending. HIV NR. Blood cx NTD.   Imaging CT Head Stable head CT without acute intracranial findings. Mild atrophy  and chronic small vessel ischemic changes.   MRI Brain  No abnormal or pathologic enhancement seen within the brain.  EEG 05/01/21: This EEG was obtained while awake and drowsy and is abnormal due to  - mild diffuse slowing indicating global cerebral dysfunction - focal slowing indicating superimposed focal dysfunction in the left frontotemporal region.    Assessment: 65 yo male who presented yesterday with confusion on top of cognitive deficit. Awaiting HSV PCR from [facility] and is being treated empirically with Acyclovir. CSF studies were not concerning for bacterial meningitis, so Vancomycin, Ceftriaxone, and Ampicillin were discontinued. NP believes there may be some underlying cognitive deficit here as evidenced by his joking to cover up unknown answers and wife's story of his short term memory loss at home. We are still awaiting HSV results, but his mental status improvement while here, is not consistent with HSV, but will continue medications. Due to questionable status on admission, will continue Keppra and make decision about continuing it on discharge after HSV returns.  Impression: -cognitive deficit, unknown etiology.  -rule out HSV encephalitis - ?autoimmune/limbic encephalitis  Plan:  -Continue Keppra for now.  -Await HSV results.  -Continue Acyclovir for now.  -seizure precautions.  -Ativan 53m IV q 4 hours prn for a seizure lasting over 3 minutes.  -Autoimmune panel on serum and CSF ordered.  -may need steroids as diagnosis is uncertain Will continue to follow.   Pt seen by KClance Boll MSN, APN-BC/Nurse Practitioner/Neuro and later by MD. Note and plan to be edited as needed by MD.  Pager: [phone] NEUROHOSPITALIST ADDENDUM Performed a face to face diagnostic evaluation.   I have reviewed the contents of history and physical exam as documented by PA/ARNP/Resident and agree with above documentation.  I have discussed and formulated the above plan as documented.  Edits to the note have been made as needed.  Impression/Key exam findings/Plan: 65y/o male with prior hx of basal ganglia stroke with no residual symptoms who was found acutely confused with not making much sense. He was seen in the ED and workup with MRI Brain w/o contrast with mesial temporal T2/FLAIR hyperintensity. He was started on empiric acyclovir, LP at bedside was unsuccessful and [facility]  On exam, he appears to have very poor insight into his presentation. Daughter at bedside states they have noted some changes concerning for potential cognitive decline for about a year with a much rapid decline over the last 2-3 months. His cognitive deficit is more apparent when having a conversation with him. He is elusive and tries to hid if he does not know the answer by smiling and shifting the conversation. He did not know why he is in the hospital or details of agitation last night. He is still oriented to self, place, month  and year and can do calculations fairly well.  Here MRI with contrast does not show any enhancing lesion. LP with mildly elevated protein to 76 but no pleocytosis and not particularly concerning for bacterial meningitis. HSV PCR is still pending and he is on acyclovir for now. CSF and serum Autoimmune encephalitis is pending. Encephalopathy labs with low Vit B12 and being replaced.  I think if the HSV PCR comes back negative, I would lean towards a trial of high dose corticosteroids. I hope that this helps him. This may very well serve as a diagnostic test specially since the diagnosis is uncertain. Important to note that despite the progress made in diagnostics, in many instances, patients with immunotherapy-responsive encephalopathies and dementias are seronegative for encephalitis-specific antibodies. It requires a high index of suspicion and I would again mention think that the noted mesial temporal T2/Flair abnormalities are very atypical of dementia.  Donnetta Simpers, MD [facility] [phone]   If 7pm to 7am, please call on call as listed on AMION.

## 2021-05-02 NOTE — Progress Notes (Signed)
[facility] night shift.  The nursing staff communicated to me that the patient has been restless and wanting to leave the facility.  The security officers had to be called as he was refusing medications.  The security officers had to be called to his bedside.  I ordered haloperidol 2.5 mg IVP x1, but he did not allow the staff to administer it.  He subsequently settled down after speaking to his wife on the phone.  I also spoke to his wife.  She has decided to come in to help him reorient.  I spoke to the patient and told him that his wife was coming to the hospital.  He still did not want to be medicated, but subsequently allowed it.  Soft wrist restraints and as needed lorazepam 1 mg IVP ordered.  Tennis Must, MD.

## 2021-05-02 NOTE — Progress Notes (Signed)
Dr Olevia Bowens ordered for PRN Ativan and bilateral restraint to pt

## 2021-05-02 NOTE — Progress Notes (Signed)
[facility] night shift.  The nursing staff reported that the patient has been confused, anxious and restless.  He has removed his IV catheter and mittens.  He has been trying to get out of bed and is at high risk for injury.  Haloperidol 2.5 mg IVP x1 dose ordered.  Tennis Must, MD.

## 2021-05-02 NOTE — Progress Notes (Signed)
Referred pt to DR Olevia Bowens thru secured message, informed pt was confused and anxious, and informed about the incidence of removing contraption and IV cannula, mittens was already applied but pt managed to removed it,

## 2021-05-02 NOTE — Progress Notes (Signed)
PROGRESS NOTE    Justin [name]  YIA:[id number] DOB: 1956-06-16 DOA: 04/30/2021 PCP: [name], MD   Brief Narrative:  HPI On 04/30/2021 by Dr. Phillips Climes Ziquan [name]  is a 65 y.o. male, with past medical history of anxiety, hypertension, OSA on CPAP, CVA, patient was brought by his wife for evaluation for consult mental status, wife report she left husband this morning at 9 AM with normal state of health, when she came back later in the day was significantly confused, wife report overall patient has some memory problems over the last few month, but has been mild, and they have worsened after stroke last year, as well after COVID infection last year, but overall he was still working(but he had to cut significantly to his mobility issues over the last few month), he was driving until yesterday, but today he is much more confused, and she was not not able to have any meaningful conversation with him, so she brought him to ED. - in ED patient is afebrile, with no meningeal signs, but his MRI did show abnormal signal involving the mesial left temporal lobe, with differential including subacute infarction, encephalitis and seizure, LP was unsuccessful in ED, patient was started empirically on acyclovir, ampicillin, Rocephin and vancomycin, received 1 mg of IV Ativan without significant improvement of mental status, but he became more sleepy after receiving it, [facility] consulted to admit.  Assessment & Plan   Acute metabolic encephalopathy -Patient presented with confusion more so than he has been.  Per wife, he is supposed to follow-up with neurology for mental status evaluation in August. -MRI brain showed significant abnormal signal in the left temporal lobe, differential includes subacute infarction, encephalitis versus seizure -MRI brain with contrast showed no evidence normal or pathological enhancement seen within the brain -Currently patient is alert and oriented x3 -Per wife,  he does appear to be improved this morning.  Patient did have some confusion and restlessness overnight. -Patient with no neurological focal deficits at this time -IR consulted for LP (LP attempted in the ED was unsuccessful)-not consistent with bacterial infection, therefore ampicillin, ceftriaxone and vancomycin have been discontinued -Continue acyclovir -EEG showed mild diffuse slowing indicating global cerebral dysfunction.  Focal slowing indicating superimposed focal dysfunction in the lef frontotemporal region. -HSV and VDRL currently pending -Neurology consulted and appreciated -Patient receiving vitamin B1 supplementation, level pending  Essential hypertension -Antihypertensives currently held (metoprolol, HCTZ and losartan) -Norvasc was recently discontinued by his PCP  Obstructive sleep apnea -Continue CPAP  History of CVA  History of ulcerative colitis -Continue mesalamine  DVT Prophylaxis SCDs  Code Status: Full  Family Communication: Wife at bedside  Disposition Plan:  Status is: Inpatient  Remains inpatient appropriate because:Altered mental status, Ongoing diagnostic testing needed not appropriate for outpatient work up, and IV treatments appropriate due to intensity of illness or inability to take PO  Dispo: The patient is from: Home              Anticipated d/c is to: Home              Patient currently is not medically stable to d/c.   Difficult to place patient No   Consultants Neurology Interventional radiology  Procedures  Lumbar puncture EEG  Antibiotics   Anti-infectives (From admission, onward)    Start     Dose/Rate Route Frequency Ordered Stop   05/01/21 1200  ampicillin (OMNIPEN) 2 g in sodium chloride 0.9 % 100 mL IVPB  Status:  Discontinued        2 g 300 mL/hr over 20 Minutes Intravenous Every 4 hours 05/01/21 0806 05/01/21 1928   05/01/21 0400  vancomycin (VANCOCIN) IVPB 1000 mg/200 mL premix  Status:  Discontinued        1,000  mg 200 mL/hr over 60 Minutes Intravenous Every 8 hours 04/30/21 1942 05/01/21 1928   04/30/21 2000  ampicillin (OMNIPEN) 2 g in sodium chloride 0.9 % 100 mL IVPB  Status:  Discontinued        2 g 300 mL/hr over 20 Minutes Intravenous Every 4 hours 04/30/21 1918 05/01/21 0806   04/30/21 1945  vancomycin (VANCOCIN) IVPB 1000 mg/200 mL premix        1,000 mg 200 mL/hr over 60 Minutes Intravenous  Once 04/30/21 1940 04/30/21 2230   04/30/21 1930  vancomycin (VANCOCIN) IVPB 1000 mg/200 mL premix        1,000 mg 200 mL/hr over 60 Minutes Intravenous  Once 04/30/21 1918 04/30/21 2216   04/30/21 1930  cefTRIAXone (ROCEPHIN) 2 g in sodium chloride 0.9 % 100 mL IVPB  Status:  Discontinued        2 g 200 mL/hr over 30 Minutes Intravenous Every 12 hours 04/30/21 1918 05/01/21 1928   04/30/21 1930  acyclovir (ZOVIRAX) 930 mg in dextrose 5 % 250 mL IVPB        10 mg/kg  93 kg 268.6 mL/hr over 60 Minutes Intravenous Every 8 hours 04/30/21 1919         Subjective:   Justin [name] seen and examined today.  Patient with no complaints this morning.  Denies chest pain shortness of breath, abdominal pain, nausea or vomiting, diarrhea, dizziness or headache.  States he has not had a bowel movement today.  Objective:   Vitals:   05/01/21 2344 05/02/21 0454 05/02/21 0723 05/02/21 1249  BP: 111/71  116/77 134/88  Pulse: 60 (!) 56 63 (!) 54  Resp: 18 17 20 19   Temp: 98.4 F (36.9 C) 98.4 F (36.9 C) 98.8 F (37.1 C) 98.8 F (37.1 C)  TempSrc: Oral Axillary Oral Oral  SpO2: 94% 96% 95% 97%  Weight:      Height:        Intake/Output Summary (Last 24 hours) at 05/02/2021 1346 Last data filed at 05/02/2021 0900 Gross per 24 hour  Intake 3816.74 ml  Output 1675 ml  Net 2141.74 ml    Filed Weights   04/30/21 1303  Weight: 93 kg    Exam General: Well developed, well nourished, NAD, appears stated age HEENT: NCAT, mucous membranes moist.  Cardiovascular: S1 S2 auscultated, RRR Respiratory:  Clear to auscultation bilaterally  Abdomen: Soft, nontender, nondistended, + bowel sounds Extremities: warm dry without cyanosis clubbing or edema Neuro: AAOx3, nonfocal Skin: Without rashes exudates or nodules Psych: Pleasant, appropriate mood and affect   Data Reviewed: I have personally reviewed following labs and imaging studies  CBC: Recent Labs  Lab 04/30/21 1344 05/01/21 0136  WBC 11.9* 13.2*  NEUTROABS 10.3*  --   HGB 15.9 13.9  HCT 46.9 41.6  MCV 96.9 96.5  PLT 304 016    Basic Metabolic Panel: Recent Labs  Lab 04/30/21 1344 05/01/21 0136  NA 134* 136  K 3.7 4.1  CL 103 104  CO2 25 24  GLUCOSE 128* 102*  BUN 18 15  CREATININE 0.98 0.97  CALCIUM 9.2 8.4*    GFR: Estimated Creatinine Clearance: 85.8 mL/min (by C-G formula based on SCr of 0.97 mg/dL).  Liver Function Tests: Recent Labs  Lab 04/30/21 1344 05/01/21 0136  AST 30 20  ALT 32 20  ALKPHOS 92 64  BILITOT 1.2 1.2  PROT 7.3 5.7*  ALBUMIN 4.2 3.2*    No results for input(s): LIPASE, AMYLASE in the last 168 hours. No results for input(s): AMMONIA in the last 168 hours. Coagulation Profile: Recent Labs  Lab 04/30/21 1344  INR 1.1    Cardiac Enzymes: No results for input(s): CKTOTAL, CKMB, CKMBINDEX, TROPONINI in the last 168 hours. BNP (last 3 results) No results for input(s): PROBNP in the last 8760 hours. HbA1C: No results for input(s): HGBA1C in the last 72 hours. CBG: No results for input(s): GLUCAP in the last 168 hours. Lipid Profile: No results for input(s): CHOL, HDL, LDLCALC, TRIG, CHOLHDL, LDLDIRECT in the last 72 hours. Thyroid Function Tests: No results for input(s): TSH, T4TOTAL, FREET4, T3FREE, THYROIDAB in the last 72 hours. Anemia Panel: No results for input(s): VITAMINB12, FOLATE, FERRITIN, TIBC, IRON, RETICCTPCT in the last 72 hours. Urine analysis:    Component Value Date/Time   COLORURINE YELLOW 04/30/2021 1709   APPEARANCEUR CLEAR 04/30/2021 1709   LABSPEC  1.015 04/30/2021 1709   PHURINE 6.0 04/30/2021 1709   GLUCOSEU NEGATIVE 04/30/2021 1709   HGBUR NEGATIVE 04/30/2021 1709   BILIRUBINUR NEGATIVE 04/30/2021 1709   [place] 04/30/2021 1709   PROTEINUR NEGATIVE 04/30/2021 1709   NITRITE NEGATIVE 04/30/2021 1709   LEUKOCYTESUR NEGATIVE 04/30/2021 1709   Sepsis Labs: @LABRCNTIP (procalcitonin:4,lacticidven:4)  ) Recent Results (from the past 240 hour(s))  Resp Panel by RT-PCR (Flu A&B, Covid) Nasopharyngeal Swab     Status: None   Collection Time: 04/30/21  5:15 PM   Specimen: Nasopharyngeal Swab; Nasopharyngeal(NP) swabs in vial transport medium  Result Value Ref Range Status   SARS Coronavirus 2 by RT PCR NEGATIVE NEGATIVE Final    Comment: (NOTE) SARS-CoV-2 target nucleic acids are NOT DETECTED.  The SARS-CoV-2 RNA is generally detectable in upper respiratory specimens during the acute phase of infection. The lowest concentration of SARS-CoV-2 viral copies this assay can detect is 138 copies/mL. A negative result does not preclude SARS-Cov-2 infection and should not be used as the sole basis for treatment or other patient management decisions. A negative result may occur with  improper specimen collection/handling, submission of specimen other than nasopharyngeal swab, presence of viral mutation(s) within the areas targeted by this assay, and inadequate number of viral copies(<138 copies/mL). A negative result must be combined with clinical observations, patient history, and epidemiological information. The expected result is Negative.  Fact Sheet for Patients:  EntrepreneurPulse.com.au  Fact Sheet for Healthcare Providers:  IncredibleEmployment.be  This test is no t yet approved or cleared by the [place] FDA and  has been authorized for detection and/or diagnosis of SARS-CoV-2 by FDA under an Emergency Use Authorization (EUA). This EUA will remain  in effect (meaning this  test can be used) for the duration of the COVID-19 declaration under Section 564(b)(1) of the Act, 21 U.S.C.section 360bbb-3(b)(1), unless the authorization is terminated  or revoked sooner.       Influenza A by PCR NEGATIVE NEGATIVE Final   Influenza B by PCR NEGATIVE NEGATIVE Final    Comment: (NOTE) The Xpert Xpress SARS-CoV-2/FLU/RSV plus assay is intended as an aid in the diagnosis of influenza from Nasopharyngeal swab specimens and should not be used as a sole basis for treatment. Nasal washings and aspirates are unacceptable for Xpert Xpress SARS-CoV-2/FLU/RSV testing.  Fact Sheet for Patients: EntrepreneurPulse.com.au  Fact Sheet for Healthcare Providers: IncredibleEmployment.be  This test is not yet approved or cleared by the [place] FDA and has been authorized for detection and/or diagnosis of SARS-CoV-2 by FDA under an Emergency Use Authorization (EUA). This EUA will remain in effect (meaning this test can be used) for the duration of the COVID-19 declaration under Section 564(b)(1) of the Act, 21 U.S.C. section 360bbb-3(b)(1), unless the authorization is terminated or revoked.  Performed at [facility], [address], [place], [place] [address]   Blood culture (routine x 2)     Status: None (Preliminary result)   Collection Time: 04/30/21  6:19 PM   Specimen: BLOOD RIGHT ARM  Result Value Ref Range Status   Specimen Description BLOOD RIGHT ARM  Final   Special Requests   Final    Blood Culture adequate volume BOTTLES DRAWN AEROBIC AND ANAEROBIC   Culture   Final    NO GROWTH < 12 HOURS Performed at [facility], [address], [place], [place] [address]    Report Status PENDING  Incomplete  Blood culture (routine x 2)     Status: None (Preliminary result)   Collection Time: 04/30/21  6:29 PM   Specimen: Left Antecubital; Blood  Result Value Ref Range Status   Specimen Description LEFT ANTECUBITAL  Final   Special  Requests   Final    Blood Culture results may not be optimal due to an excessive volume of blood received in culture bottles BOTTLES DRAWN AEROBIC AND ANAEROBIC   Culture   Final    NO GROWTH < 12 HOURS Performed at [facility], [address] [place], [place] [address]    Report Status PENDING  Incomplete  CSF culture w Gram Stain     Status: None (Preliminary result)   Collection Time: 04/30/21 11:33 PM   Specimen: CSF; Cerebrospinal Fluid  Result Value Ref Range Status   Specimen Description CSF  Final   Special Requests Normal  Final   Gram Stain   Final    WBC PRESENT,BOTH PMN AND MONONUCLEAR NO ORGANISMS SEEN CYTOSPIN SMEAR    Culture   Final    NO GROWTH 1 DAY Performed at [facility], [place] [address] [place], [place] [address]    Report Status PENDING  Incomplete      Radiology Studies: DG Chest 2 View  Result Date: 04/30/2021 CLINICAL DATA:  Cough and altered mental status. EXAM: CHEST - 2 VIEW COMPARISON:  Chest x-ray dated December 02, 2020. FINDINGS: The heart size and mediastinal contours are within normal limits. Both lungs are clear. The visualized skeletal structures are unremarkable. IMPRESSION: No active cardiopulmonary disease. Electronically Signed   By: Titus Dubin M.D.   On: 04/30/2021 14:59   MR BRAIN WO CONTRAST  Result Date: 04/30/2021 CLINICAL DATA:  Increasing memory problems EXAM: MRI HEAD WITHOUT CONTRAST TECHNIQUE: Multiplanar, multiecho pulse sequences of the brain and surrounding structures were obtained without intravenous contrast. COMPARISON:  Images only 05/29/2020.  Dictation by non-radiologist. FINDINGS: Brain: There is abnormal diffusion hyperintensity along the head and body of the left hippocampus. Probable asymmetric involvement of the uncus as well. Suspect subtle corresponding T2 FLAIR hyperintensity. No mass effect. No evidence of intracranial hemorrhage. Patchy additional foci of T2 hyperintensity in the supratentorial white  matter are nonspecific but may reflect minor chronic microvascular ischemic changes. Prominence of the ventricles and sulci reflects generalized parenchymal volume loss. There is no hydrocephalus or extra-axial fluid collection. Vascular: Major vessel flow voids at  the skull base are preserved. Skull and upper cervical spine: Normal marrow signal is preserved. Sinuses/Orbits: Paranasal sinuses are aerated. Orbits are unremarkable. Other: Sella is unremarkable.  Mastoid air cells are clear. IMPRESSION: Abnormal signal involving the mesial left temporal lobe. Differential considerations include subacute infarction, encephalitis (autoimmune or early changes of herpes), and seizure effect. There is no expansion or mass effect suggest glioma. Follow-up is recommended. CSF sampling could be considered depending on clinical suspicion. Electronically Signed   By: Macy Mis M.D.   On: 04/30/2021 16:12   MR BRAIN W CONTRAST  Result Date: 05/02/2021 CLINICAL DATA:  Initial evaluation for brain mass or lesion. EXAM: MRI HEAD WITH CONTRAST TECHNIQUE: Multiplanar, multiecho pulse sequences of the brain and surrounding structures were obtained with intravenous contrast. CONTRAST:  61m GADAVIST GADOBUTROL 1 MMOL/ML IV SOLN COMPARISON:  Prior noncontrast MRI from 04/30/2021. FINDINGS: Brain: Postcontrast imaging of the brain only was performed. No abnormal or pathologic enhancement seen within the brain. Particularly, no abnormal enhancement seen about the previously seen signal abnormality about the mesial left temporal lobe. No other new or progressive finding. Vascular: Normal intravascular enhancement seen throughout the brain. Skull and upper cervical spine: Unremarkable. Sinuses/Orbits: Unremarkable. Other: None. IMPRESSION: No abnormal or pathologic enhancement seen within the brain. Electronically Signed   By: BJeannine BogaM.D.   On: 05/02/2021 02:42   EEG adult  Result Date: 05/01/2021 SDerek Jack  MD     05/01/2021  8:20 PM Routine EEG Report KEMEKA LINDNERis a 65y.o. male with a history of encephalopathy who is undergoing an EEG to evaluate for seizures. Report: This EEG was acquired with electrodes placed according to the International 10-20 electrode system (including Fp1, Fp2, F3, F4, C3, C4, P3, P4, O1, O2, T3, T4, T5, T6, A1, A2, Fz, Cz, Pz). The following electrodes were missing or displaced: none. The occipital dominant rhythm was 7-8 Hz with overriding beta frequencies. This activity is reactive to stimulation. Drowsiness was manifested by background fragmentation; deeper stages of sleep were not identified. There was focal slowing over the left frontotemporal region. There were no interictal epileptiform discharges. There were no electrographic seizures identified. Photic stimulation and hyperventilation were not performed. Impression: This EEG was obtained while awake and drowsy and is abnormal due to - mild diffuse slowing indicating global cerebral dysfunction - focal slowing indicating superimposed focal dysfunction in the left frontotemporal region. CSu Monks MD [facility] [phone] 7pm- 7am, please page neurology on call as listed in [facility]   DG FL GUIDED LUMBAR PUNCTURE  Result Date: 05/01/2021 CLINICAL DATA:  Altered mental status and question of encephalitis. EXAM: DIAGNOSTIC LUMBAR PUNCTURE UNDER FLUOROSCOPIC GUIDANCE COMPARISON:  Preceding brain MRI FLUOROSCOPY TIME:  Fluoroscopy Time:  0.5 minutes Radiation Exposure Index (if provided by the fluoroscopic device): 2.9 mGy air kerma Number of Acquired Spot Images: 0 PROCEDURE: Informed consent was obtained from the patient and his wife prior to the procedure. The patient was able to sign for himself. With the patient prone, the lower back was prepped with Betadine. 1% Lidocaine was used for local anesthesia. Lumbar puncture was performed at the L5-S1 level using a 20 gauge needle with return of clear CSF with an  opening pressure of 17 cm water. As requested, 30 ml of CSF were obtained for laboratory studies. The patient tolerated the procedure well and there were no apparent complications. IMPRESSION: Uncomplicated lumbar puncture. Electronically Signed   By: JMonte FantasiaM.D.   On: 05/01/2021  11:15     Scheduled Meds:  atorvastatin  40 mg Oral Daily   levETIRAcetam  500 mg Oral BID   Mesalamine  800 mg Oral BID   Continuous Infusions:  sodium chloride 100 mL/hr at 05/01/21 2000   acyclovir 930 mg (05/02/21 0554)     LOS: 2 days   Time Spent in minutes   45 minutes  Jamaira Sherk D.O. on 05/02/2021 at 1:46 PM  Between 7am to 7pm - Please see pager noted on [email or web address]  After 7pm go to [email or web address]  And look for the night coverage person covering for me after hours  [facility] Office  [phone]

## 2021-05-02 NOTE — Progress Notes (Signed)
Called up the pt wife Ms [name], inform her about the aggressive behavior of the pt and pt is more confused than before. Also inform her that the security might   hold him back to bed so that med can be given to him, Ms Wand talk the pt on the phone

## 2021-05-02 NOTE — Progress Notes (Signed)
Security came as called by Rozetta Nunnery, tried to talk with the pt to go back on bed and let the nurses to do care and management, pt refused, informed DR Olevia Bowens thru secured message about the disrupting behavior of  pt, pt was too restless and anxious and not allowing [place] to get near to him and to give the med

## 2021-05-03 LAB — VDRL, CSF: VDRL Quant, CSF: NONREACTIVE

## 2021-05-03 MED ORDER — DEXTROSE 5 % IV SOLN
10.0000 mg/kg | Freq: Three times a day (TID) | INTRAVENOUS | Status: DC
  Administered 2021-05-03 – 2021-05-04 (×4): 850 mg via INTRAVENOUS
  Filled 2021-05-03 (×7): qty 17

## 2021-05-03 NOTE — Progress Notes (Signed)
Neurology Progress Note  S: Patient seen and examined.  Wife at bedside-stayed with him overnight.  Was more calm and not agitated overnight-wife felt having a familiar person around helped. Wife feels he is somewhat better than the day prior but still off of his baseline.  O: Current vital signs: BP 108/67 (BP Location: Right Arm)   Pulse (!) 58   Temp 98.9 F (37.2 C) (Axillary)   Resp 15   Ht 6' 1"  (1.854 m)   Wt 93 kg   SpO2 90%   BMI 27.05 kg/m  Vital signs in last 24 hours: Temp:  [98.7 F (37.1 C)-99 F (37.2 C)] 98.9 F (37.2 C) (06/27 0511) Pulse Rate:  [48-62] 58 (06/27 0729) Resp:  [15-20] 15 (06/27 0511) BP: (108-146)/(66-88) 108/67 (06/27 0729) SpO2:  [90 %-97 %] 90 % (06/27 0729)  GENERAL: Awake, alert in NAD. HEENT: Normocephalic and atraumatic. LUNGS: Normal respiratory effort.  Extremities warm well perfused Abdomen nondistended nontender  NEURO:  Mental status: Awake alert oriented to the place, person and time. He was able to tell me his correct date of birth but not his age that she was aware that he has a birthday coming up in a few days and that he was born in [birth year] and the year currently is 2022 but even with coaching could not come up with how old he would be when he has his birthday this Friday. Poor attention concentration No dysarthria No aphasia Cranial Nerves: Other than questionable mild right nasolabial fold asymmetry that is very subtle, no other cranial nerve deficits noted. Motor examination with antigravity strength in all fours with normal tone and range of motion. Sensation intact to light touch without extinction. Gait- deferred  Medications  Current Facility-Administered Medications:    0.9 %  sodium chloride infusion, , Intravenous, Continuous, Elgergawy, Silver Huguenin, MD, Last Rate: 100 mL/hr at 05/02/21 2218, New Bag at 05/02/21 2218   acyclovir (ZOVIRAX) 930 mg in dextrose 5 % 250 mL IVPB, 10 mg/kg, Intravenous, Q8H, Mikhail,  Hartshorne, DO, Last Rate: 268.6 mL/hr at 05/03/21 0044, 930 mg at 05/03/21 0044   atorvastatin (LIPITOR) tablet 40 mg, 40 mg, Oral, Daily, Cristal Ford, DO, 40 mg at 05/03/21 2505   cyanocobalamin ((VITAMIN B-12)) injection 1,000 mcg, 1,000 mcg, Intramuscular, Daily, 1,000 mcg at 05/03/21 0842 **FOLLOWED BY** [START ON 05/09/2021] vitamin B-12 (CYANOCOBALAMIN) tablet 1,000 mcg, 1,000 mcg, Oral, Daily, Donnetta Simpers, MD   levETIRAcetam (KEPPRA) tablet 500 mg, 500 mg, Oral, BID, Greta Doom, MD, 500 mg at 05/03/21 0841   LORazepam (ATIVAN) injection 1 mg, 1 mg, Intravenous, Q4H PRN, Kirby-Graham, Karsten Fells, NP   Mesalamine (ASACOL) DR capsule 800 mg, 800 mg, Oral, BID, Cristal Ford, DO, 800 mg at 05/03/21 3976   thiamine (B-1) injection 100 mg, 100 mg, Intravenous, Daily **OR** thiamine tablet 100 mg, 100 mg, Oral, Daily, Donnetta Simpers, MD, 100 mg at 05/03/21 0841  Pertinent Labs Results for HARNOOR, KOHLES (MRN [id number]) as of 05/02/2021 11:21  Ref. Range 05/01/2021 11:20  Appearance, CSF Latest Ref Range: CLEAR  CLEAR  Glucose, CSF Latest Ref Range: 40 - 70 mg/dL 76 (H)  RBC Count, CSF Latest Ref Range: 0 /cu mm 0  WBC, CSF Latest Ref Range: 0 - 5 /cu mm 3  Other Cells, CSF Unknown TOO FEW TO COUNT, SMEAR AVAILABLE FOR REVIEW  Color, CSF Latest Ref Range: COLORLESS  COLORLESS  Supernatant Unknown NOT INDICATED  Total  Protein, CSF Latest Ref Range: 15 -  45 mg/dL 30  Tube # Unknown 1  CSF culture and gram stain negative.  HSV pending. HIV NR. Blood cx NTD.   Imaging CT Head Stable head CT without acute intracranial findings. Mild atrophy and chronic small vessel ischemic changes.   MRI Brain with and without contrast-no abnormal enhancement but MRI without contrast with left mesial temporal T2/flair hyperintensity.  EEG 05/01/21: This EEG was obtained while awake and drowsy and is abnormal due to  - mild diffuse slowing indicating global cerebral dysfunction - focal  slowing indicating superimposed focal dysfunction in the left frontotemporal region.    Assessment:  65 year old man with prior history of basal ganglia stroke with no residual symptoms with acute confusion and speech not making sense brought in for emergent evaluation.  MRI of the brain without contrast concerning for some mesial temporal asymmetry of the mesial temporal lobe without abnormal enhancement.  Empirically started on acyclovir-LP with normal protein and 3 white cells-less likely to be HSV encephalitis but HSV PCR is pending at this time. On further discussion with wife, has had a decline in his cognition not just acutely over the last couple of days but over about a year with much more rapid decline in the last 2 or 3 months.  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  She says that he has had difficulty with short-term family to the point where they would be getting ready to go somewhere and he would asked multiple times even after being told where they are going as to what their destination is.  This is atypical for him. He also had a lot of agitation when no family member was present-question sundowning in the setting of underlying neurodegenerative process such as dementia but at this point, in the acute hospital setting, the diagnosis of dementia and work-up of dementia is not reasonable to make. Also noted structural abnormality on the MRI of the brain would not be something expected with dementia-unilateral mesial temporal sclerosis not is not a common finding of dementias. No concern for bacterial infection. Other etiologies such as autoimmune encephalitis could also be playing a role. Vitamin B12 low-being replaced.  Timing is being replaced. I would not be aversive to high-dose steroids for empirically treating for presumed autoimmune encephalitis at this time-once the HSV PCR comes back  negative.  Recommendations: -Continue Keppra for now.  -Await HSV results.  -Continue Acyclovir for now.  If HSV PCR is negative, can discontinue acyclovir. -We will consider high-dose steroids if HSV PCR is negative. -seizure precautions.  -Ativan 7m IV q 4 hours prn for a seizure lasting over 3 minutes.  -Autoimmune panel on serum and CSF ordered and pending-will take multiple days to come back -Outpatient neuropsychological testing   Will follow. Discussed with Dr. MRee Kidaat bedside. Discussed with the wife at bedside-answered questions  -- AAmie Portland MD Neurologist [facility] Pager: [phone]

## 2021-05-03 NOTE — Plan of Care (Signed)

## 2021-05-03 NOTE — Progress Notes (Signed)
PHARMACY NOTE:  ANTIMICROBIAL RENAL DOSAGE ADJUSTMENT  Current antimicrobial regimen includes a mismatch between antimicrobial dosage and estimated renal function.  As per policy approved by the Pharmacy & Therapeutics and Medical Executive Committees, the antimicrobial dosage will be adjusted accordingly.  Current antimicrobial dosage:  Acyclovir 964m IV q8  Indication: R/o HSV encephalitis  Renal Function:  Estimated Creatinine Clearance: 85.8 mL/min (by C-G formula based on SCr of 0.97 mg/dL). []      On intermittent HD, scheduled: []      On CRRT    Antimicrobial dosage has been changed to:  Acyclovir 8563mIV q8 based on adjusted body weight  Additional comments:  MiOnnie BoerPharmD, BCIDP, AAHIVP, CPP Infectious Disease Pharmacist 05/03/2021 11:17 AM

## 2021-05-03 NOTE — Progress Notes (Signed)
PROGRESS NOTE    Justin [name]  XBD:[id number] DOB: 10/17/1956 DOA: 04/30/2021 PCP: [name], MD   Brief Narrative:  HPI On 04/30/2021 by Dr. Phillips Climes Corgan Mormile  is a 65 y.o. male, with past medical history of anxiety, hypertension, OSA on CPAP, CVA, patient was brought by his wife for evaluation for consult mental status, wife report she left husband this morning at 9 AM with normal state of health, when she came back later in the day was significantly confused, wife report overall patient has some memory problems over the last few month, but has been mild, and they have worsened after stroke last year, as well after COVID infection last year, but overall he was still working(but he had to cut significantly to his mobility issues over the last few month), he was driving until yesterday, but today he is much more confused, and she was not not able to have any meaningful conversation with him, so she brought him to ED. - in ED patient is afebrile, with no meningeal signs, but his MRI did show abnormal signal involving the mesial left temporal lobe, with differential including subacute infarction, encephalitis and seizure, LP was unsuccessful in ED, patient was started empirically on acyclovir, ampicillin, Rocephin and vancomycin, received 1 mg of IV Ativan without significant improvement of mental status, but he became more sleepy after receiving it, [facility] consulted to admit.  Interim history Admitted with AMS, w/up pending.  Assessment & Plan   Acute metabolic encephalopathy -Patient presented with confusion more so than he has been.  Per wife, he is supposed to follow-up with neurology for mental status evaluation in August. -MRI brain showed significant abnormal signal in the left temporal lobe, differential includes subacute infarction, encephalitis versus seizure -MRI brain with contrast showed no evidence normal or pathological enhancement seen within the  brain -Currently patient is alert and oriented x3 -Per wife, he does appear to be improved this morning.  Patient did have some confusion and restlessness overnight. -Patient with no neurological focal deficits at this time -IR consulted for LP (LP attempted in the ED was unsuccessful)-not consistent with bacterial infection, therefore ampicillin, ceftriaxone and vancomycin have been discontinued -Continue acyclovir -EEG showed mild diffuse slowing indicating global cerebral dysfunction.  Focal slowing indicating superimposed focal dysfunction in the lef frontotemporal region. -HSV and VDRL currently pending -Pending autoimmune workup pending  -Neurology consulted and appreciated -Patient receiving vitamin B1 supplementation, level pending  Essential hypertension -Antihypertensives currently held (metoprolol, HCTZ and losartan) -Norvasc was recently discontinued by his PCP  Obstructive sleep apnea -Continue CPAP  History of CVA  History of ulcerative colitis -Continue mesalamine  DVT Prophylaxis SCDs  Code Status: Full  Family Communication: Wife at bedside  Disposition Plan:  Status is: Inpatient  Remains inpatient appropriate because:Altered mental status, Ongoing diagnostic testing needed not appropriate for outpatient work up, and IV treatments appropriate due to intensity of illness or inability to take PO  Dispo: The patient is from: Home              Anticipated d/c is to: Home              Patient currently is not medically stable to d/c.   Difficult to place patient No   Consultants Neurology Interventional radiology  Procedures  Lumbar puncture EEG  Antibiotics   Anti-infectives (From admission, onward)    Start     Dose/Rate Route Frequency Ordered Stop   05/01/21 1200  ampicillin (OMNIPEN)  2 g in sodium chloride 0.9 % 100 mL IVPB  Status:  Discontinued        2 g 300 mL/hr over 20 Minutes Intravenous Every 4 hours 05/01/21 0806 05/01/21 1928    05/01/21 0400  vancomycin (VANCOCIN) IVPB 1000 mg/200 mL premix  Status:  Discontinued        1,000 mg 200 mL/hr over 60 Minutes Intravenous Every 8 hours 04/30/21 1942 05/01/21 1928   04/30/21 2000  ampicillin (OMNIPEN) 2 g in sodium chloride 0.9 % 100 mL IVPB  Status:  Discontinued        2 g 300 mL/hr over 20 Minutes Intravenous Every 4 hours 04/30/21 1918 05/01/21 0806   04/30/21 1945  vancomycin (VANCOCIN) IVPB 1000 mg/200 mL premix        1,000 mg 200 mL/hr over 60 Minutes Intravenous  Once 04/30/21 1940 04/30/21 2230   04/30/21 1930  vancomycin (VANCOCIN) IVPB 1000 mg/200 mL premix        1,000 mg 200 mL/hr over 60 Minutes Intravenous  Once 04/30/21 1918 04/30/21 2216   04/30/21 1930  cefTRIAXone (ROCEPHIN) 2 g in sodium chloride 0.9 % 100 mL IVPB  Status:  Discontinued        2 g 200 mL/hr over 30 Minutes Intravenous Every 12 hours 04/30/21 1918 05/01/21 1928   04/30/21 1930  acyclovir (ZOVIRAX) 930 mg in dextrose 5 % 250 mL IVPB        10 mg/kg  93 kg 268.6 mL/hr over 60 Minutes Intravenous Every 8 hours 04/30/21 1919         Subjective:   Justin [name] seen and examined today.  Patient with no complaints this morning. Denies chest pain shortness of breath, abdominal pain, nausea or vomiting, diarrhea, dizziness or headache.    Objective:   Vitals:   05/02/21 2240 05/02/21 2341 05/03/21 0511 05/03/21 0729  BP:  132/70 108/67 108/67  Pulse: (!) 49 (!) 49 (!) 48 (!) 58  Resp: 18 15 15    Temp:  98.9 F (37.2 C) 98.9 F (37.2 C)   TempSrc:  Axillary Axillary Oral  SpO2: 93% 95% 94% 90%  Weight:      Height:        Intake/Output Summary (Last 24 hours) at 05/03/2021 0950 Last data filed at 05/03/2021 0408 Gross per 24 hour  Intake 3545.72 ml  Output 300 ml  Net 3245.72 ml    Filed Weights   04/30/21 1303  Weight: 93 kg    Exam General: Well developed, well nourished, NAD, appears stated age HEENT: NCAT, mucous membranes moist.  Cardiovascular: S1 S2  auscultated, RRR Respiratory: Clear to auscultation bilaterally  Abdomen: Soft, nontender, nondistended, + bowel sounds Extremities: warm dry without cyanosis clubbing or edema Neuro: AAOx3, nonfocal Skin: Without rashes exudates or nodules Psych: Pleasant, appropriate mood and affect   Data Reviewed: I have personally reviewed following labs and imaging studies  CBC: Recent Labs  Lab 04/30/21 1344 05/01/21 0136  WBC 11.9* 13.2*  NEUTROABS 10.3*  --   HGB 15.9 13.9  HCT 46.9 41.6  MCV 96.9 96.5  PLT 304 179    Basic Metabolic Panel: Recent Labs  Lab 04/30/21 1344 05/01/21 0136  NA 134* 136  K 3.7 4.1  CL 103 104  CO2 25 24  GLUCOSE 128* 102*  BUN 18 15  CREATININE 0.98 0.97  CALCIUM 9.2 8.4*    GFR: Estimated Creatinine Clearance: 85.8 mL/min (by C-G formula based on SCr of 0.97 mg/dL).  Liver Function Tests: Recent Labs  Lab 04/30/21 1344 05/01/21 0136  AST 30 20  ALT 32 20  ALKPHOS 92 64  BILITOT 1.2 1.2  PROT 7.3 5.7*  ALBUMIN 4.2 3.2*    No results for input(s): LIPASE, AMYLASE in the last 168 hours. Recent Labs  Lab 05/02/21 1442  AMMONIA 19   Coagulation Profile: Recent Labs  Lab 04/30/21 1344  INR 1.1    Cardiac Enzymes: No results for input(s): CKTOTAL, CKMB, CKMBINDEX, TROPONINI in the last 168 hours. BNP (last 3 results) No results for input(s): PROBNP in the last 8760 hours. HbA1C: No results for input(s): HGBA1C in the last 72 hours. CBG: No results for input(s): GLUCAP in the last 168 hours. Lipid Profile: No results for input(s): CHOL, HDL, LDLCALC, TRIG, CHOLHDL, LDLDIRECT in the last 72 hours. Thyroid Function Tests: Recent Labs    05/02/21 1442  TSH 1.334   Anemia Panel: Recent Labs    05/02/21 1442  VITAMINB12 279  FOLATE 9.7   Urine analysis:    Component Value Date/Time   COLORURINE YELLOW 04/30/2021 1709   APPEARANCEUR CLEAR 04/30/2021 1709   LABSPEC 1.015 04/30/2021 1709   PHURINE 6.0 04/30/2021 1709    GLUCOSEU NEGATIVE 04/30/2021 1709   HGBUR NEGATIVE 04/30/2021 1709   BILIRUBINUR NEGATIVE 04/30/2021 1709   [place] 04/30/2021 1709   PROTEINUR NEGATIVE 04/30/2021 1709   NITRITE NEGATIVE 04/30/2021 1709   LEUKOCYTESUR NEGATIVE 04/30/2021 1709   Sepsis Labs: @LABRCNTIP (procalcitonin:4,lacticidven:4)  ) Recent Results (from the past 240 hour(s))  Resp Panel by RT-PCR (Flu A&B, Covid) Nasopharyngeal Swab     Status: None   Collection Time: 04/30/21  5:15 PM   Specimen: Nasopharyngeal Swab; Nasopharyngeal(NP) swabs in vial transport medium  Result Value Ref Range Status   SARS Coronavirus 2 by RT PCR NEGATIVE NEGATIVE Final    Comment: (NOTE) SARS-CoV-2 target nucleic acids are NOT DETECTED.  The SARS-CoV-2 RNA is generally detectable in upper respiratory specimens during the acute phase of infection. The lowest concentration of SARS-CoV-2 viral copies this assay can detect is 138 copies/mL. A negative result does not preclude SARS-Cov-2 infection and should not be used as the sole basis for treatment or other patient management decisions. A negative result may occur with  improper specimen collection/handling, submission of specimen other than nasopharyngeal swab, presence of viral mutation(s) within the areas targeted by this assay, and inadequate number of viral copies(<138 copies/mL). A negative result must be combined with clinical observations, patient history, and epidemiological information. The expected result is Negative.  Fact Sheet for Patients:  EntrepreneurPulse.com.au  Fact Sheet for Healthcare Providers:  IncredibleEmployment.be  This test is no t yet approved or cleared by the [place] FDA and  has been authorized for detection and/or diagnosis of SARS-CoV-2 by FDA under an Emergency Use Authorization (EUA). This EUA will remain  in effect (meaning this test can be used) for the duration of the COVID-19  declaration under Section 564(b)(1) of the Act, 21 U.S.C.section 360bbb-3(b)(1), unless the authorization is terminated  or revoked sooner.       Influenza A by PCR NEGATIVE NEGATIVE Final   Influenza B by PCR NEGATIVE NEGATIVE Final    Comment: (NOTE) The Xpert Xpress SARS-CoV-2/FLU/RSV plus assay is intended as an aid in the diagnosis of influenza from Nasopharyngeal swab specimens and should not be used as a sole basis for treatment. Nasal washings and aspirates are unacceptable for Xpert Xpress SARS-CoV-2/FLU/RSV testing.  Fact Sheet for Patients: EntrepreneurPulse.com.au  Fact Sheet for Healthcare Providers: IncredibleEmployment.be  This test is not yet approved or cleared by the [place] FDA and has been authorized for detection and/or diagnosis of SARS-CoV-2 by FDA under an Emergency Use Authorization (EUA). This EUA will remain in effect (meaning this test can be used) for the duration of the COVID-19 declaration under Section 564(b)(1) of the Act, 21 U.S.C. section 360bbb-3(b)(1), unless the authorization is terminated or revoked.  Performed at [facility], [address] [place], [place] [address]   Blood culture (routine x 2)     Status: None (Preliminary result)   Collection Time: 04/30/21  6:19 PM   Specimen: BLOOD RIGHT ARM  Result Value Ref Range Status   Specimen Description BLOOD RIGHT ARM  Final   Special Requests   Final    Blood Culture adequate volume BOTTLES DRAWN AEROBIC AND ANAEROBIC   Culture   Final    NO GROWTH 3 DAYS Performed at [facility], [address] [place], [place] [address]    Report Status PENDING  Incomplete  Blood culture (routine x 2)     Status: None (Preliminary result)   Collection Time: 04/30/21  6:29 PM   Specimen: Left Antecubital; Blood  Result Value Ref Range Status   Specimen Description LEFT ANTECUBITAL  Final   Special Requests   Final    Blood Culture results may not be  optimal due to an excessive volume of blood received in culture bottles BOTTLES DRAWN AEROBIC AND ANAEROBIC   Culture   Final    NO GROWTH 3 DAYS Performed at [facility], [address] [place], [place] [address]    Report Status PENDING  Incomplete  CSF culture w Gram Stain     Status: None (Preliminary result)   Collection Time: 04/30/21 11:33 PM   Specimen: CSF; Cerebrospinal Fluid  Result Value Ref Range Status   Specimen Description CSF  Final   Special Requests Normal  Final   Gram Stain   Final    WBC PRESENT,BOTH PMN AND MONONUCLEAR NO ORGANISMS SEEN CYTOSPIN SMEAR    Culture   Final    NO GROWTH 2 DAYS Performed at [facility] [address] [place], [place] [address]    Report Status PENDING  Incomplete      Radiology Studies: MR BRAIN W CONTRAST  Result Date: 05/02/2021 CLINICAL DATA:  Initial evaluation for brain mass or lesion. EXAM: MRI HEAD WITH CONTRAST TECHNIQUE: Multiplanar, multiecho pulse sequences of the brain and surrounding structures were obtained with intravenous contrast. CONTRAST:  5m GADAVIST GADOBUTROL 1 MMOL/ML IV SOLN COMPARISON:  Prior noncontrast MRI from 04/30/2021. FINDINGS: Brain: Postcontrast imaging of the brain only was performed. No abnormal or pathologic enhancement seen within the brain. Particularly, no abnormal enhancement seen about the previously seen signal abnormality about the mesial left temporal lobe. No other new or progressive finding. Vascular: Normal intravascular enhancement seen throughout the brain. Skull and upper cervical spine: Unremarkable. Sinuses/Orbits: Unremarkable. Other: None. IMPRESSION: No abnormal or pathologic enhancement seen within the brain. Electronically Signed   By: BJeannine BogaM.D.   On: 05/02/2021 02:42   EEG adult  Result Date: 05/01/2021 SDerek Jack MD     05/01/2021  8:20 PM Routine EEG Report KNATHANUEL CABREJAis a 65y.o. male with a history of encephalopathy who is undergoing an  EEG to evaluate for seizures. Report: This EEG was acquired with electrodes placed according to the International 10-20 electrode system (including Fp1,  Fp2, F3, F4, C3, C4, P3, P4, O1, O2, T3, T4, T5, T6, A1, A2, Fz, Cz, Pz). The following electrodes were missing or displaced: none. The occipital dominant rhythm was 7-8 Hz with overriding beta frequencies. This activity is reactive to stimulation. Drowsiness was manifested by background fragmentation; deeper stages of sleep were not identified. There was focal slowing over the left frontotemporal region. There were no interictal epileptiform discharges. There were no electrographic seizures identified. Photic stimulation and hyperventilation were not performed. Impression: This EEG was obtained while awake and drowsy and is abnormal due to - mild diffuse slowing indicating global cerebral dysfunction - focal slowing indicating superimposed focal dysfunction in the left frontotemporal region. Su Monks, MD [facility] [phone] If 7pm- 7am, please page neurology on call as listed in [place].   DG FL GUIDED LUMBAR PUNCTURE  Result Date: 05/01/2021 CLINICAL DATA:  Altered mental status and question of encephalitis. EXAM: DIAGNOSTIC LUMBAR PUNCTURE UNDER FLUOROSCOPIC GUIDANCE COMPARISON:  Preceding brain MRI FLUOROSCOPY TIME:  Fluoroscopy Time:  0.5 minutes Radiation Exposure Index (if provided by the fluoroscopic device): 2.9 mGy air kerma Number of Acquired Spot Images: 0 PROCEDURE: Informed consent was obtained from the patient and his wife prior to the procedure. The patient was able to sign for himself. With the patient prone, the lower back was prepped with Betadine. 1% Lidocaine was used for local anesthesia. Lumbar puncture was performed at the L5-S1 level using a 20 gauge needle with return of clear CSF with an opening pressure of 17 cm water. As requested, 30 ml of CSF were obtained for laboratory studies. The patient tolerated the  procedure well and there were no apparent complications. IMPRESSION: Uncomplicated lumbar puncture. Electronically Signed   By: Monte Fantasia M.D.   On: 05/01/2021 11:15     Scheduled Meds:  atorvastatin  40 mg Oral Daily   cyanocobalamin  1,000 mcg Intramuscular Daily   Followed by   Derrill Memo ON 05/09/2021] vitamin B-12  1,000 mcg Oral Daily   levETIRAcetam  500 mg Oral BID   Mesalamine  800 mg Oral BID   thiamine injection  100 mg Intravenous Daily   Or   thiamine  100 mg Oral Daily   Continuous Infusions:  sodium chloride 100 mL/hr at 05/02/21 2218   acyclovir 930 mg (05/03/21 0044)     LOS: 3 days   Time Spent in minutes   45 minutes  Valia Wingard D.O. on 05/03/2021 at 9:50 AM  Between 7am to 7pm - Please see pager noted on [email or web address]  After 7pm go to [email or web address]  And look for the night coverage person covering for me after hours  [facility] Office  [phone]

## 2021-05-04 LAB — CSF CULTURE W GRAM STAIN
Culture: NO GROWTH
Special Requests: NORMAL

## 2021-05-04 LAB — BASIC METABOLIC PANEL
Anion gap: 6 (ref 5–15)
BUN: 12 mg/dL (ref 8–23)
CO2: 24 mmol/L (ref 22–32)
Calcium: 8.6 mg/dL — ABNORMAL LOW (ref 8.9–10.3)
Chloride: 106 mmol/L (ref 98–111)
Creatinine, Ser: 0.93 mg/dL (ref 0.61–1.24)
GFR, Estimated: >60 mL/min (ref >60)
Glucose, Bld: 86 mg/dL (ref 70–99)
Potassium: 3.2 mmol/L — ABNORMAL LOW (ref 3.5–5.1)
Sodium: 136 mmol/L (ref 135–145)

## 2021-05-04 LAB — ANTI-JO 1 ANTIBODY, IGG: Anti JO-1: <0.2 AI (ref 0.0–0.9)

## 2021-05-04 LAB — RHEUMATOID FACTOR: Rheumatoid fact SerPl-aCnc: <10 IU/mL (ref <14.0)

## 2021-05-04 LAB — EXTRACTABLE NUCLEAR ANTIGEN ANTIBODY
ENA SM Ab Ser-aCnc: <0.2 AI (ref 0.0–0.9)
Ribonucleic Protein: <0.2 AI (ref 0.0–0.9)
SSA (Ro) (ENA) Antibody, IgG: 0.2 AI (ref 0.0–0.9)
SSB (La) (ENA) Antibody, IgG: <0.2 AI (ref 0.0–0.9)
Scleroderma (Scl-70) (ENA) Antibody, IgG: <0.2 AI (ref 0.0–0.9)
ds DNA Ab: <1 IU/mL (ref 0–9)

## 2021-05-04 LAB — HSV 1/2 PCR, CSF
HSV-1 DNA: NEGATIVE
HSV-2 DNA: NEGATIVE

## 2021-05-04 LAB — THYROGLOBULIN ANTIBODY: Thyroglobulin Antibody: <1 IU/mL (ref 0.0–0.9)

## 2021-05-04 MED ORDER — POTASSIUM CHLORIDE CRYS ER 20 MEQ PO TBCR
40.0000 meq | EXTENDED_RELEASE_TABLET | Freq: Once | ORAL | Status: AC
  Administered 2021-05-04: 40 meq via ORAL
  Filled 2021-05-04: qty 2

## 2021-05-04 MED ORDER — SODIUM CHLORIDE 0.9 % IV SOLN
1000.0000 mg | Freq: Every day | INTRAVENOUS | Status: DC
  Administered 2021-05-04 – 2021-05-05 (×2): 1000 mg via INTRAVENOUS
  Filled 2021-05-04 (×2): qty 8

## 2021-05-04 NOTE — Progress Notes (Signed)
PROGRESS NOTE    Justin [name]  OIZ:[id number] DOB: 10-03-1956 DOA: 04/30/2021 PCP: [name], MD   Brief Narrative:  HPI On 04/30/2021 by Dr. Phillips Climes [name]  is a 65 y.o. male, with past medical history of anxiety, hypertension, OSA on CPAP, CVA, patient was brought by his wife for evaluation for consult mental status, wife report she left husband this morning at 9 AM with normal state of health, when she came back later in the day was significantly confused, wife report overall patient has some memory problems over the last few month, but has been mild, and they have worsened after stroke last year, as well after COVID infection last year, but overall he was still working(but he had to cut significantly to his mobility issues over the last few month), he was driving until yesterday, but today he is much more confused, and she was not not able to have any meaningful conversation with him, so she brought him to ED. - in ED patient is afebrile, with no meningeal signs, but his MRI did show abnormal signal involving the mesial left temporal lobe, with differential including subacute infarction, encephalitis and seizure, LP was unsuccessful in ED, patient was started empirically on acyclovir, ampicillin, Rocephin and vancomycin, received 1 mg of IV Ativan without significant improvement of mental status, but he became more sleepy after receiving it, [facility] hospitalist consulted to admit.  Interim history Admitted with AMS, w/up pending.  Assessment & Plan   Acute metabolic encephalopathy -Patient presented with confusion more so than he has been.  Per wife, he is supposed to follow-up with neurology for mental status evaluation in August. -MRI brain showed significant abnormal signal in the left temporal lobe, differential includes subacute infarction, encephalitis versus seizure -MRI brain with contrast showed no evidence normal or pathological enhancement seen within the  brain -Currently patient is alert and oriented x3 -Per wife, he does appear to be improved this morning.  Patient did have some confusion and restlessness overnight. -Patient with no neurological focal deficits at this time -IR consulted for LP (LP attempted in the ED was unsuccessful)-not consistent with bacterial infection, therefore ampicillin, ceftriaxone and vancomycin have been discontinued -HSV was negative, VDRL nonreactive, will discontinue acyclovir  -EEG showed mild diffuse slowing indicating global cerebral dysfunction.  Focal slowing indicating superimposed focal dysfunction in the lef frontotemporal region. -Pending autoimmune workup pending  -Neurology consulted and appreciated- Discussed with Dr. Rory Percy, will start high dose steroids  -Patient receiving vitamin B1 supplementation, level pending  Essential hypertension -Antihypertensives currently held (metoprolol, HCTZ and losartan) -Norvasc was recently discontinued by his PCP  Obstructive sleep apnea -Continue CPAP  History of CVA  History of ulcerative colitis -Continue mesalamine  Hypokalemia -will replace and continue to monitor   DVT Prophylaxis SCDs  Code Status: Full  Family Communication: None at bedside  Disposition Plan:  Status is: Inpatient  Remains inpatient appropriate because:Altered mental status, Ongoing diagnostic testing needed not appropriate for outpatient work up, and IV treatments appropriate due to intensity of illness or inability to take PO  Dispo: The patient is from: Home              Anticipated d/c is to: Home              Patient currently is not medically stable to d/c.   Difficult to place patient No   Consultants Neurology Interventional radiology  Procedures  Lumbar puncture EEG  Antibiotics   Anti-infectives (From admission,  onward)    Start     Dose/Rate Route Frequency Ordered Stop   05/03/21 1400  acyclovir (ZOVIRAX) 850 mg in dextrose 5 % 250 mL IVPB         10 mg/kg  85.1 kg (Adjusted) 267 mL/hr over 60 Minutes Intravenous Every 8 hours 05/03/21 1112     05/01/21 1200  ampicillin (OMNIPEN) 2 g in sodium chloride 0.9 % 100 mL IVPB  Status:  Discontinued        2 g 300 mL/hr over 20 Minutes Intravenous Every 4 hours 05/01/21 0806 05/01/21 1928   05/01/21 0400  vancomycin (VANCOCIN) IVPB 1000 mg/200 mL premix  Status:  Discontinued        1,000 mg 200 mL/hr over 60 Minutes Intravenous Every 8 hours 04/30/21 1942 05/01/21 1928   04/30/21 2000  ampicillin (OMNIPEN) 2 g in sodium chloride 0.9 % 100 mL IVPB  Status:  Discontinued        2 g 300 mL/hr over 20 Minutes Intravenous Every 4 hours 04/30/21 1918 05/01/21 0806   04/30/21 1945  vancomycin (VANCOCIN) IVPB 1000 mg/200 mL premix        1,000 mg 200 mL/hr over 60 Minutes Intravenous  Once 04/30/21 1940 04/30/21 2230   04/30/21 1930  vancomycin (VANCOCIN) IVPB 1000 mg/200 mL premix        1,000 mg 200 mL/hr over 60 Minutes Intravenous  Once 04/30/21 1918 04/30/21 2216   04/30/21 1930  cefTRIAXone (ROCEPHIN) 2 g in sodium chloride 0.9 % 100 mL IVPB  Status:  Discontinued        2 g 200 mL/hr over 30 Minutes Intravenous Every 12 hours 04/30/21 1918 05/01/21 1928   04/30/21 1930  acyclovir (ZOVIRAX) 930 mg in dextrose 5 % 250 mL IVPB  Status:  Discontinued        10 mg/kg  93 kg 268.6 mL/hr over 60 Minutes Intravenous Every 8 hours 04/30/21 1919 05/03/21 1112       Subjective:   Justin [name] seen and examined today.  Patient with no complaints today.  Denies current chest pain, shortness of breath, abdominal pain, nausea or vomiting, diarrhea constipation, dizziness or headache.    Objective:   Vitals:   05/04/21 0400 05/04/21 0411 05/04/21 0837 05/04/21 1157  BP:  119/79 126/75 116/71  Pulse: (!) 51 (!) 50 (!) 58 (!) 52  Resp:  19 18 19   Temp:  98.8 F (37.1 C) 98.1 F (36.7 C) 98.5 F (36.9 C)  TempSrc:  Oral Oral Oral  SpO2: 94% 96% 96% 97%  Weight:      Height:         Intake/Output Summary (Last 24 hours) at 05/04/2021 1442 Last data filed at 05/04/2021 1962 Gross per 24 hour  Intake 3165.61 ml  Output --  Net 3165.61 ml    Filed Weights   04/30/21 1303  Weight: 93 kg    Exam General: Well developed, well nourished, NAD, appears stated age HEENT: NCAT, mucous membranes moist.  Cardiovascular: S1 S2 auscultated, RRR Respiratory: Clear to auscultation bilaterally  Abdomen: Soft, nontender, nondistended, + bowel sounds Extremities: warm dry without cyanosis clubbing or edema Neuro: AAOx3, nonfocal Psych: Pleasant, appropriate mood and affect   Data Reviewed: I have personally reviewed following labs and imaging studies  CBC: Recent Labs  Lab 04/30/21 1344 05/01/21 0136  WBC 11.9* 13.2*  NEUTROABS 10.3*  --   HGB 15.9 13.9  HCT 46.9 41.6  MCV 96.9 96.5  PLT 304  161    Basic Metabolic Panel: Recent Labs  Lab 04/30/21 1344 05/01/21 0136 05/04/21 0201  NA 134* 136 136  K 3.7 4.1 3.2*  CL 103 104 106  CO2 25 24 24   GLUCOSE 128* 102* 86  BUN 18 15 12   CREATININE 0.98 0.97 0.93  CALCIUM 9.2 8.4* 8.6*    GFR: Estimated Creatinine Clearance: 89.5 mL/min (by C-G formula based on SCr of 0.93 mg/dL). Liver Function Tests: Recent Labs  Lab 04/30/21 1344 05/01/21 0136  AST 30 20  ALT 32 20  ALKPHOS 92 64  BILITOT 1.2 1.2  PROT 7.3 5.7*  ALBUMIN 4.2 3.2*    No results for input(s): LIPASE, AMYLASE in the last 168 hours. Recent Labs  Lab 05/02/21 1442  AMMONIA 19    Coagulation Profile: Recent Labs  Lab 04/30/21 1344  INR 1.1    Cardiac Enzymes: No results for input(s): CKTOTAL, CKMB, CKMBINDEX, TROPONINI in the last 168 hours. BNP (last 3 results) No results for input(s): PROBNP in the last 8760 hours. HbA1C: No results for input(s): HGBA1C in the last 72 hours. CBG: No results for input(s): GLUCAP in the last 168 hours. Lipid Profile: No results for input(s): CHOL, HDL, LDLCALC, TRIG, CHOLHDL,  LDLDIRECT in the last 72 hours. Thyroid Function Tests: Recent Labs    05/02/21 1442  TSH 1.334    Anemia Panel: Recent Labs    05/02/21 1442  VITAMINB12 279  FOLATE 9.7    Urine analysis:    Component Value Date/Time   COLORURINE YELLOW 04/30/2021 1709   APPEARANCEUR CLEAR 04/30/2021 1709   LABSPEC 1.015 04/30/2021 1709   PHURINE 6.0 04/30/2021 1709   GLUCOSEU NEGATIVE 04/30/2021 1709   HGBUR NEGATIVE 04/30/2021 1709   BILIRUBINUR NEGATIVE 04/30/2021 1709   [place] 04/30/2021 1709   PROTEINUR NEGATIVE 04/30/2021 1709   NITRITE NEGATIVE 04/30/2021 1709   LEUKOCYTESUR NEGATIVE 04/30/2021 1709   Sepsis Labs: @LABRCNTIP (procalcitonin:4,lacticidven:4)  ) Recent Results (from the past 240 hour(s))  Resp Panel by RT-PCR (Flu A&B, Covid) Nasopharyngeal Swab     Status: None   Collection Time: 04/30/21  5:15 PM   Specimen: Nasopharyngeal Swab; Nasopharyngeal(NP) swabs in vial transport medium  Result Value Ref Range Status   SARS Coronavirus 2 by RT PCR NEGATIVE NEGATIVE Final    Comment: (NOTE) SARS-CoV-2 target nucleic acids are NOT DETECTED.  The SARS-CoV-2 RNA is generally detectable in upper respiratory specimens during the acute phase of infection. The lowest concentration of SARS-CoV-2 viral copies this assay can detect is 138 copies/mL. A negative result does not preclude SARS-Cov-2 infection and should not be used as the sole basis for treatment or other patient management decisions. A negative result may occur with  improper specimen collection/handling, submission of specimen other than nasopharyngeal swab, presence of viral mutation(s) within the areas targeted by this assay, and inadequate number of viral copies(<138 copies/mL). A negative result must be combined with clinical observations, patient history, and epidemiological information. The expected result is Negative.  Fact Sheet for Patients:   EntrepreneurPulse.com.au  Fact Sheet for Healthcare Providers:  IncredibleEmployment.be  This test is no t yet approved or cleared by the [place] FDA and  has been authorized for detection and/or diagnosis of SARS-CoV-2 by FDA under an Emergency Use Authorization (EUA). This EUA will remain  in effect (meaning this test can be used) for the duration of the COVID-19 declaration under Section 564(b)(1) of the Act, 21 U.S.C.section 360bbb-3(b)(1), unless the authorization is terminated  or  revoked sooner.       Influenza A by PCR NEGATIVE NEGATIVE Final   Influenza B by PCR NEGATIVE NEGATIVE Final    Comment: (NOTE) The Xpert Xpress SARS-CoV-2/FLU/RSV plus assay is intended as an aid in the diagnosis of influenza from Nasopharyngeal swab specimens and should not be used as a sole basis for treatment. Nasal washings and aspirates are unacceptable for Xpert Xpress SARS-CoV-2/FLU/RSV testing.  Fact Sheet for Patients: EntrepreneurPulse.com.au  Fact Sheet for Healthcare Providers: IncredibleEmployment.be  This test is not yet approved or cleared by the [place] FDA and has been authorized for detection and/or diagnosis of SARS-CoV-2 by FDA under an Emergency Use Authorization (EUA). This EUA will remain in effect (meaning this test can be used) for the duration of the COVID-19 declaration under Section 564(b)(1) of the Act, 21 U.S.C. section 360bbb-3(b)(1), unless the authorization is terminated or revoked.  Performed at [facility], [address], [place], [place] [address]   Blood culture (routine x 2)     Status: None (Preliminary result)   Collection Time: 04/30/21  6:19 PM   Specimen: BLOOD RIGHT ARM  Result Value Ref Range Status   Specimen Description BLOOD RIGHT ARM  Final   Special Requests   Final    Blood Culture adequate volume BOTTLES DRAWN AEROBIC AND ANAEROBIC   Culture   Final     NO GROWTH 4 DAYS Performed at [facility], [address], [place], [place] [address]    Report Status PENDING  Incomplete  Blood culture (routine x 2)     Status: None (Preliminary result)   Collection Time: 04/30/21  6:29 PM   Specimen: Left Antecubital; Blood  Result Value Ref Range Status   Specimen Description LEFT ANTECUBITAL  Final   Special Requests   Final    Blood Culture results may not be optimal due to an excessive volume of blood received in culture bottles BOTTLES DRAWN AEROBIC AND ANAEROBIC   Culture   Final    NO GROWTH 4 DAYS Performed at [facility], [address], [place], [place] [address]    Report Status PENDING  Incomplete  CSF culture w Gram Stain     Status: None   Collection Time: 04/30/21 11:33 PM   Specimen: CSF; Cerebrospinal Fluid  Result Value Ref Range Status   Specimen Description CSF  Final   Special Requests Normal  Final   Gram Stain   Final    WBC PRESENT,BOTH PMN AND MONONUCLEAR NO ORGANISMS SEEN CYTOSPIN SMEAR    Culture   Final    NO GROWTH 3 DAYS Performed at [facility] [address], [place], [place] [address]    Report Status 05/04/2021 FINAL  Final      Radiology Studies: No results found.   Scheduled Meds:  atorvastatin  40 mg Oral Daily   cyanocobalamin  1,000 mcg Intramuscular Daily   Followed by   Derrill Memo ON 05/09/2021] vitamin B-12  1,000 mcg Oral Daily   levETIRAcetam  500 mg Oral BID   Mesalamine  800 mg Oral BID   thiamine injection  100 mg Intravenous Daily   Or   thiamine  100 mg Oral Daily   Continuous Infusions:  sodium chloride 100 mL/hr at 05/04/21 1302   acyclovir 850 mg (05/04/21 1308)     LOS: 4 days   Time Spent in minutes   45 minutes  Chidera Dearcos D.O. on 05/04/2021 at 2:42 PM  Between 7am to 7pm -  Please see pager noted on [email or web address]  After 7pm go to [email or web address]  And look for the night coverage person covering for me after hours  [facility] Office   [phone]

## 2021-05-04 NOTE — Progress Notes (Signed)
PROGRESS NOTE    Justin [name]  PPJ:[id number] DOB: 1956/09/14 DOA: 04/30/2021 PCP: [name], MD   Brief Narrative:  HPI On 04/30/2021 by Dr. Phillips Climes Justin [name]  is a 65 y.o. male, with past medical history of anxiety, hypertension, OSA on CPAP, CVA, patient was brought by his wife for evaluation for consult mental status, wife report she left husband this morning at 9 AM with normal state of health, when she came back later in the day was significantly confused, wife report overall patient has some memory problems over the last few month, but has been mild, and they have worsened after stroke last year, as well after COVID infection last year, but overall he was still working(but he had to cut significantly to his mobility issues over the last few month), he was driving until yesterday, but today he is much more confused, and she was not not able to have any meaningful conversation with him, so she brought him to ED. - in ED patient is afebrile, with no meningeal signs, but his MRI did show abnormal signal involving the mesial left temporal lobe, with differential including subacute infarction, encephalitis and seizure, LP was unsuccessful in ED, patient was started empirically on acyclovir, ampicillin, Rocephin and vancomycin, received 1 mg of IV Ativan without significant improvement of mental status, but he became more sleepy after receiving it, [facility] consulted to admit.  Interim history Admitted with AMS, w/up pending.  Assessment & Plan   Acute metabolic encephalopathy -Patient presented with confusion more so than he has been.  Per wife, he is supposed to follow-up with neurology for mental status evaluation in August. -MRI brain showed significant abnormal signal in the left temporal lobe, differential includes subacute infarction, encephalitis versus seizure -MRI brain with contrast showed no evidence normal or pathological enhancement seen within the  brain -Currently patient is alert and oriented x3 -Per wife, he does appear to be improved this morning.  Patient did have some confusion and restlessness overnight. -Patient with no neurological focal deficits at this time -IR consulted for LP (LP attempted in the ED was unsuccessful)-not consistent with bacterial infection, therefore ampicillin, ceftriaxone and vancomycin have been discontinued -HSV was negative, VDRL nonreactive, will discontinue acyclovir  -EEG showed mild diffuse slowing indicating global cerebral dysfunction.  Focal slowing indicating superimposed focal dysfunction in the lef frontotemporal region. -Pending autoimmune workup pending  -Neurology consulted and appreciated- Discussed with Dr. Rory Percy, will start high dose steroids  -Patient receiving vitamin B1 supplementation, level pending  Essential hypertension -Antihypertensives currently held (metoprolol, HCTZ and losartan) -Norvasc was recently discontinued by his PCP  Obstructive sleep apnea -Continue CPAP  History of CVA  History of ulcerative colitis -Continue mesalamine  DVT Prophylaxis SCDs  Code Status: Full  Family Communication: None at bedside  Disposition Plan:  Status is: Inpatient  Remains inpatient appropriate because:Altered mental status, Ongoing diagnostic testing needed not appropriate for outpatient work up, and IV treatments appropriate due to intensity of illness or inability to take PO  Dispo: The patient is from: Home              Anticipated d/c is to: Home              Patient currently is not medically stable to d/c.   Difficult to place patient No   Consultants Neurology Interventional radiology  Procedures  Lumbar puncture EEG  Antibiotics   Anti-infectives (From admission, onward)    Start  Dose/Rate Route Frequency Ordered Stop   05/03/21 1400  acyclovir (ZOVIRAX) 850 mg in dextrose 5 % 250 mL IVPB        10 mg/kg  85.1 kg (Adjusted) 267 mL/hr over 60  Minutes Intravenous Every 8 hours 05/03/21 1112     05/01/21 1200  ampicillin (OMNIPEN) 2 g in sodium chloride 0.9 % 100 mL IVPB  Status:  Discontinued        2 g 300 mL/hr over 20 Minutes Intravenous Every 4 hours 05/01/21 0806 05/01/21 1928   05/01/21 0400  vancomycin (VANCOCIN) IVPB 1000 mg/200 mL premix  Status:  Discontinued        1,000 mg 200 mL/hr over 60 Minutes Intravenous Every 8 hours 04/30/21 1942 05/01/21 1928   04/30/21 2000  ampicillin (OMNIPEN) 2 g in sodium chloride 0.9 % 100 mL IVPB  Status:  Discontinued        2 g 300 mL/hr over 20 Minutes Intravenous Every 4 hours 04/30/21 1918 05/01/21 0806   04/30/21 1945  vancomycin (VANCOCIN) IVPB 1000 mg/200 mL premix        1,000 mg 200 mL/hr over 60 Minutes Intravenous  Once 04/30/21 1940 04/30/21 2230   04/30/21 1930  vancomycin (VANCOCIN) IVPB 1000 mg/200 mL premix        1,000 mg 200 mL/hr over 60 Minutes Intravenous  Once 04/30/21 1918 04/30/21 2216   04/30/21 1930  cefTRIAXone (ROCEPHIN) 2 g in sodium chloride 0.9 % 100 mL IVPB  Status:  Discontinued        2 g 200 mL/hr over 30 Minutes Intravenous Every 12 hours 04/30/21 1918 05/01/21 1928   04/30/21 1930  acyclovir (ZOVIRAX) 930 mg in dextrose 5 % 250 mL IVPB  Status:  Discontinued        10 mg/kg  93 kg 268.6 mL/hr over 60 Minutes Intravenous Every 8 hours 04/30/21 1919 05/03/21 1112       Subjective:   Justin [name] seen and examined today.  Patient with no complaints today.  Denies current chest pain, shortness of breath, abdominal pain, nausea or vomiting, diarrhea constipation, dizziness or headache.    Objective:   Vitals:   05/04/21 0400 05/04/21 0411 05/04/21 0837 05/04/21 1157  BP:  119/79 126/75 116/71  Pulse: (!) 51 (!) 50 (!) 58 (!) 52  Resp:  19 18 19   Temp:  98.8 F (37.1 C) 98.1 F (36.7 C) 98.5 F (36.9 C)  TempSrc:  Oral Oral Oral  SpO2: 94% 96% 96% 97%  Weight:      Height:        Intake/Output Summary (Last 24 hours) at 05/04/2021  1438 Last data filed at 05/04/2021 1610 Gross per 24 hour  Intake 3165.61 ml  Output --  Net 3165.61 ml    Filed Weights   04/30/21 1303  Weight: 93 kg    Exam General: Well developed, well nourished, NAD, appears stated age HEENT: NCAT, mucous membranes moist.  Cardiovascular: S1 S2 auscultated, RRR Respiratory: Clear to auscultation bilaterally  Abdomen: Soft, nontender, nondistended, + bowel sounds Extremities: warm dry without cyanosis clubbing or edema Neuro: AAOx3, nonfocal Psych: Pleasant, appropriate mood and affect   Data Reviewed: I have personally reviewed following labs and imaging studies  CBC: Recent Labs  Lab 04/30/21 1344 05/01/21 0136  WBC 11.9* 13.2*  NEUTROABS 10.3*  --   HGB 15.9 13.9  HCT 46.9 41.6  MCV 96.9 96.5  PLT 304 960    Basic Metabolic Panel: Recent Labs  Lab 04/30/21 1344 05/01/21 0136 05/04/21 0201  NA 134* 136 136  K 3.7 4.1 3.2*  CL 103 104 106  CO2 25 24 24   GLUCOSE 128* 102* 86  BUN 18 15 12   CREATININE 0.98 0.97 0.93  CALCIUM 9.2 8.4* 8.6*    GFR: Estimated Creatinine Clearance: 89.5 mL/min (by C-G formula based on SCr of 0.93 mg/dL). Liver Function Tests: Recent Labs  Lab 04/30/21 1344 05/01/21 0136  AST 30 20  ALT 32 20  ALKPHOS 92 64  BILITOT 1.2 1.2  PROT 7.3 5.7*  ALBUMIN 4.2 3.2*    No results for input(s): LIPASE, AMYLASE in the last 168 hours. Recent Labs  Lab 05/02/21 1442  AMMONIA 19    Coagulation Profile: Recent Labs  Lab 04/30/21 1344  INR 1.1    Cardiac Enzymes: No results for input(s): CKTOTAL, CKMB, CKMBINDEX, TROPONINI in the last 168 hours. BNP (last 3 results) No results for input(s): PROBNP in the last 8760 hours. HbA1C: No results for input(s): HGBA1C in the last 72 hours. CBG: No results for input(s): GLUCAP in the last 168 hours. Lipid Profile: No results for input(s): CHOL, HDL, LDLCALC, TRIG, CHOLHDL, LDLDIRECT in the last 72 hours. Thyroid Function Tests: Recent  Labs    05/02/21 1442  TSH 1.334    Anemia Panel: Recent Labs    05/02/21 1442  VITAMINB12 279  FOLATE 9.7    Urine analysis:    Component Value Date/Time   COLORURINE YELLOW 04/30/2021 1709   APPEARANCEUR CLEAR 04/30/2021 1709   LABSPEC 1.015 04/30/2021 1709   PHURINE 6.0 04/30/2021 1709   GLUCOSEU NEGATIVE 04/30/2021 1709   HGBUR NEGATIVE 04/30/2021 1709   BILIRUBINUR NEGATIVE 04/30/2021 1709   [place] 04/30/2021 1709   PROTEINUR NEGATIVE 04/30/2021 1709   NITRITE NEGATIVE 04/30/2021 1709   LEUKOCYTESUR NEGATIVE 04/30/2021 1709   Sepsis Labs: @LABRCNTIP (procalcitonin:4,lacticidven:4)  ) Recent Results (from the past 240 hour(s))  Resp Panel by RT-PCR (Flu A&B, Covid) Nasopharyngeal Swab     Status: None   Collection Time: 04/30/21  5:15 PM   Specimen: Nasopharyngeal Swab; Nasopharyngeal(NP) swabs in vial transport medium  Result Value Ref Range Status   SARS Coronavirus 2 by RT PCR NEGATIVE NEGATIVE Final    Comment: (NOTE) SARS-CoV-2 target nucleic acids are NOT DETECTED.  The SARS-CoV-2 RNA is generally detectable in upper respiratory specimens during the acute phase of infection. The lowest concentration of SARS-CoV-2 viral copies this assay can detect is 138 copies/mL. A negative result does not preclude SARS-Cov-2 infection and should not be used as the sole basis for treatment or other patient management decisions. A negative result may occur with  improper specimen collection/handling, submission of specimen other than nasopharyngeal swab, presence of viral mutation(s) within the areas targeted by this assay, and inadequate number of viral copies(<138 copies/mL). A negative result must be combined with clinical observations, patient history, and epidemiological information. The expected result is Negative.  Fact Sheet for Patients:  EntrepreneurPulse.com.au  Fact Sheet for Healthcare Providers:   IncredibleEmployment.be  This test is no t yet approved or cleared by the [place] FDA and  has been authorized for detection and/or diagnosis of SARS-CoV-2 by FDA under an Emergency Use Authorization (EUA). This EUA will remain  in effect (meaning this test can be used) for the duration of the COVID-19 declaration under Section 564(b)(1) of the Act, 21 U.S.C.section 360bbb-3(b)(1), unless the authorization is terminated  or revoked sooner.       Influenza A  by PCR NEGATIVE NEGATIVE Final   Influenza B by PCR NEGATIVE NEGATIVE Final    Comment: (NOTE) The Xpert Xpress SARS-CoV-2/FLU/RSV plus assay is intended as an aid in the diagnosis of influenza from Nasopharyngeal swab specimens and should not be used as a sole basis for treatment. Nasal washings and aspirates are unacceptable for Xpert Xpress SARS-CoV-2/FLU/RSV testing.  Fact Sheet for Patients: EntrepreneurPulse.com.au  Fact Sheet for Healthcare Providers: IncredibleEmployment.be  This test is not yet approved or cleared by the [place] FDA and has been authorized for detection and/or diagnosis of SARS-CoV-2 by FDA under an Emergency Use Authorization (EUA). This EUA will remain in effect (meaning this test can be used) for the duration of the COVID-19 declaration under Section 564(b)(1) of the Act, 21 U.S.C. section 360bbb-3(b)(1), unless the authorization is terminated or revoked.  Performed at [facility], [address] [place], [place] [address]   Blood culture (routine x 2)     Status: None (Preliminary result)   Collection Time: 04/30/21  6:19 PM   Specimen: BLOOD RIGHT ARM  Result Value Ref Range Status   Specimen Description BLOOD RIGHT ARM  Final   Special Requests   Final    Blood Culture adequate volume BOTTLES DRAWN AEROBIC AND ANAEROBIC   Culture   Final    NO GROWTH 4 DAYS Performed at [facility], [address], [place], [place]  [address]    Report Status PENDING  Incomplete  Blood culture (routine x 2)     Status: None (Preliminary result)   Collection Time: 04/30/21  6:29 PM   Specimen: Left Antecubital; Blood  Result Value Ref Range Status   Specimen Description LEFT ANTECUBITAL  Final   Special Requests   Final    Blood Culture results may not be optimal due to an excessive volume of blood received in culture bottles BOTTLES DRAWN AEROBIC AND ANAEROBIC   Culture   Final    NO GROWTH 4 DAYS Performed at [facility], [address] [place], [place] [address]    Report Status PENDING  Incomplete  CSF culture w Gram Stain     Status: None   Collection Time: 04/30/21 11:33 PM   Specimen: CSF; Cerebrospinal Fluid  Result Value Ref Range Status   Specimen Description CSF  Final   Special Requests Normal  Final   Gram Stain   Final    WBC PRESENT,BOTH PMN AND MONONUCLEAR NO ORGANISMS SEEN CYTOSPIN SMEAR    Culture   Final    NO GROWTH 3 DAYS Performed at [facility] [address] [place], [place] [address]    Report Status 05/04/2021 FINAL  Final      Radiology Studies: No results found.   Scheduled Meds:  atorvastatin  40 mg Oral Daily   cyanocobalamin  1,000 mcg Intramuscular Daily   Followed by   Derrill Memo ON 05/09/2021] vitamin B-12  1,000 mcg Oral Daily   levETIRAcetam  500 mg Oral BID   Mesalamine  800 mg Oral BID   thiamine injection  100 mg Intravenous Daily   Or   thiamine  100 mg Oral Daily   Continuous Infusions:  sodium chloride 100 mL/hr at 05/04/21 1302   acyclovir 850 mg (05/04/21 1308)     LOS: 4 days   Time Spent in minutes   45 minutes  Sylus Stgermain D.O. on 05/04/2021 at 2:38 PM  Between 7am to 7pm - Please see pager noted on [email or web address]  After 7pm go  to [email or web address]  And look for the night coverage person covering for me after hours  [facility] Office  [phone]

## 2021-05-04 NOTE — Care Management Important Message (Signed)
Important Message  Patient Details  Name: Justin [name] MRN: [id number] Date of Birth: 05-24-1956   Medicare Important Message Given:  Yes - Important Message mailed due to current National Emergency   Verbal consent obtained due to current National Emergency  Relationship to patient: Spouse/Significant Other Contact Name: [name] Call Date: 05/04/21  Time: 64 Phone: [phone] Outcome: Spoke with contact Important Message mailed to: Patient address on file   Delorse Lek 05/04/2021, 1:44 PM

## 2021-05-04 NOTE — Progress Notes (Signed)
Neurology Progress Note  S: Patient seen and examined.  Wife at bedside Reports he slept well-woke up a few times to go to the bathroom overnight but was not agitated  O: Current vital signs: BP 119/79 (BP Location: Right Arm)   Pulse (!) 50   Temp 98.8 F (37.1 C) (Oral)   Resp 19   Ht 6' 1"  (1.854 m)   Wt 93 kg   SpO2 96%   BMI 27.05 kg/m  Vital signs in last 24 hours: Temp:  [98 F (36.7 C)-98.8 F (37.1 C)] 98.8 F (37.1 C) (06/28 0411) Pulse Rate:  [50-58] 50 (06/28 0411) Resp:  [17-19] 19 (06/28 0411) BP: (110-133)/(71-87) 119/79 (06/28 0411) SpO2:  [94 %-98 %] 96 % (06/28 0411)  GENERAL: Awake, alert in NAD. HEENT: Normocephalic and atraumatic. LUNGS: Normal respiratory effort.  Extremities warm well perfused Abdomen nondistended nontender  NEURO:  Mental status: Awake alert oriented to the place, person and time. He was able to tell me his correct date of birth but not his age that she was aware that he has a birthday coming up in a few days and that he was born in [birth year] and the year currently is 2022 but even with coaching could not come up with how old he would be when he has his birthday this Friday. Poor attention concentration No dysarthria No aphasia Cranial Nerves: Other than questionable mild right nasolabial fold asymmetry that is very subtle, no other cranial nerve deficits noted. Motor examination with antigravity strength in all fours with normal tone and range of motion. Sensation intact to light touch without extinction. Gait- deferred  Medications  Current Facility-Administered Medications:    0.9 %  sodium chloride infusion, , Intravenous, Continuous, Elgergawy, Silver Huguenin, MD, Last Rate: 100 mL/hr at 05/04/21 0400, Infusion Verify at 05/04/21 0400   acyclovir (ZOVIRAX) 850 mg in dextrose 5 % 250 mL IVPB, 10 mg/kg (Adjusted), Intravenous, Q8H, Pham, Minh Q, RPH-CPP, Last Rate: 267 mL/hr at 05/04/21 0557, 850 mg at 05/04/21 0557   atorvastatin  (LIPITOR) tablet 40 mg, 40 mg, Oral, Daily, Cristal Ford, DO, 40 mg at 05/04/21 1245   cyanocobalamin ((VITAMIN B-12)) injection 1,000 mcg, 1,000 mcg, Intramuscular, Daily, 1,000 mcg at 05/04/21 0817 **FOLLOWED BY** [START ON 05/09/2021] vitamin B-12 (CYANOCOBALAMIN) tablet 1,000 mcg, 1,000 mcg, Oral, Daily, Donnetta Simpers, MD   levETIRAcetam (KEPPRA) tablet 500 mg, 500 mg, Oral, BID, Greta Doom, MD, 500 mg at 05/04/21 0817   LORazepam (ATIVAN) injection 1 mg, 1 mg, Intravenous, Q4H PRN, Kirby-Graham, Karsten Fells, NP   Mesalamine (ASACOL) DR capsule 800 mg, 800 mg, Oral, BID, Cristal Ford, DO, 800 mg at 05/04/21 8099   thiamine (B-1) injection 100 mg, 100 mg, Intravenous, Daily **OR** thiamine tablet 100 mg, 100 mg, Oral, Daily, Donnetta Simpers, MD, 100 mg at 05/04/21 0817  Pertinent Labs Results for EZRAEL, SAM (MRN [id number]) as of 05/02/2021 11:21  Ref. Range 05/01/2021 11:20  Appearance, CSF Latest Ref Range: CLEAR  CLEAR  Glucose, CSF Latest Ref Range: 40 - 70 mg/dL 76 (H)  RBC Count, CSF Latest Ref Range: 0 /cu mm 0  WBC, CSF Latest Ref Range: 0 - 5 /cu mm 3  Other Cells, CSF Unknown TOO FEW TO COUNT, SMEAR AVAILABLE FOR REVIEW  Color, CSF Latest Ref Range: COLORLESS  COLORLESS  Supernatant Unknown NOT INDICATED  Total  Protein, CSF Latest Ref Range: 15 - 45 mg/dL 30  Tube # Unknown 1  CSF culture and gram  stain negative.  HSV PCR pending. HIV NR. Blood cx NTD.   Imaging CT Head Stable head CT without acute intracranial findings. Mild atrophy and chronic small vessel ischemic changes.   MRI Brain with and without contrast-no abnormal enhancement but MRI without contrast with left mesial temporal T2/flair hyperintensity.  EEG 05/01/21: This EEG was obtained while awake and drowsy and is abnormal due to  - mild diffuse slowing indicating global cerebral dysfunction - focal slowing indicating superimposed focal dysfunction in the left frontotemporal region.     Assessment:  65 year old man with prior history of basal ganglia stroke with no residual symptoms with acute confusion and speech not making sense brought in for emergent evaluation.  MRI of the brain without contrast concerning for some mesial temporal asymmetry of the mesial temporal lobe without abnormal enhancement.  Empirically started on acyclovir-LP with normal protein and 3 white cells-less likely to be HSV encephalitis but HSV PCR is pending at this time. On further discussion with wife, has had a decline in his cognition not just acutely over the last couple of days but over about a year with much more rapid decline in the last 2 or 3 months.  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  She says that he has had difficulty with short-term family to the point where they would be getting ready to go somewhere and he would asked multiple times even after being told where they are going as to what their destination is.  This is atypical for him. He also had a lot of agitation when no family member was present-question sundowning in the setting of underlying neurodegenerative process such as dementia but at this point, in the acute hospital setting, the diagnosis of dementia and work-up of dementia is not reasonable to make. Also noted structural abnormality on the MRI of the brain would not be something expected with dementia-unilateral mesial temporal sclerosis not is not a common finding of dementias. No concern for bacterial infection. Other etiologies such as autoimmune encephalitis could also be playing a role. Vitamin B12 low-being replaced.  Timing is being replaced. I would not be aversive to high-dose steroids for empirically treating for presumed autoimmune encephalitis at this time-once the HSV PCR comes back negative-test still pending.  Recommendations: -Continue Keppra for now.  -Await HSV results.  -Continue Acyclovir for  now.  If HSV PCR is negative, can discontinue acyclovir. -We will consider high-dose steroids if HSV PCR is negative. -Continue B12 repletion -seizure precautions.  -Ativan 68m IV q 4 hours prn for a seizure lasting over 3 minutes.  -Autoimmune panel on serum and CSF ordered and pending-will take multiple days to come back -ANA and double-stranded DNA along with SSA SSB and rheumatoid factor ordered. -Outpatient neuropsychological testing  Will follow.  Discussed with the wife at bedside-answered questions  Plan discussed with Dr. MRee Kidaover secure chat.  -- AAmie Portland MD Neurologist [facility] Pager: [phone]

## 2021-05-05 ENCOUNTER — Other Ambulatory Visit (HOSPITAL_COMMUNITY): Payer: Self-pay | Admitting: *Deleted

## 2021-05-05 LAB — MAGNESIUM: Magnesium: 2 mg/dL (ref 1.7–2.4)

## 2021-05-05 LAB — BASIC METABOLIC PANEL
Anion gap: 7 (ref 5–15)
BUN: 12 mg/dL (ref 8–23)
CO2: 24 mmol/L (ref 22–32)
Calcium: 8.9 mg/dL (ref 8.9–10.3)
Chloride: 105 mmol/L (ref 98–111)
Creatinine, Ser: 0.95 mg/dL (ref 0.61–1.24)
GFR, Estimated: >60 mL/min (ref >60)
Glucose, Bld: 157 mg/dL — ABNORMAL HIGH (ref 70–99)
Potassium: 3.6 mmol/L (ref 3.5–5.1)
Sodium: 136 mmol/L (ref 135–145)

## 2021-05-05 LAB — MISC LABCORP TEST (SEND OUT): Labcorp test code: 505265

## 2021-05-05 LAB — CBC
HCT: 40.9 % (ref 39.0–52.0)
Hemoglobin: 14.1 g/dL (ref 13.0–17.0)
MCH: 32.2 pg (ref 26.0–34.0)
MCHC: 34.5 g/dL (ref 30.0–36.0)
MCV: 93.4 fL (ref 80.0–100.0)
Platelets: 283 10*3/uL (ref 150–400)
RBC: 4.38 MIL/uL (ref 4.22–5.81)
RDW: 12.5 % (ref 11.5–15.5)
WBC: 7.2 10*3/uL (ref 4.0–10.5)
nRBC: 0 % (ref 0.0–0.2)

## 2021-05-05 LAB — VITAMIN B1: Vitamin B1 (Thiamine): 112.1 nmol/L (ref 66.5–200.0)

## 2021-05-05 MED ORDER — PANTOPRAZOLE SODIUM 40 MG PO TBEC: 40.0000 mg | DELAYED_RELEASE_TABLET | Freq: Every day | ORAL | 0 refills | Status: DC

## 2021-05-05 MED ORDER — ASPIRIN EC 81 MG PO TBEC: 81.0000 mg | DELAYED_RELEASE_TABLET | Freq: Every day | ORAL | 2 refills | Status: DC

## 2021-05-05 MED ORDER — PANTOPRAZOLE SODIUM 40 MG PO TBEC
40.0000 mg | DELAYED_RELEASE_TABLET | Freq: Every day | ORAL | Status: DC
  Administered 2021-05-05: 40 mg via ORAL
  Filled 2021-05-05: qty 1

## 2021-05-05 MED ORDER — CYANOCOBALAMIN 500 MCG PO TABS: 500.0000 ug | ORAL_TABLET | Freq: Every day | ORAL | 1 refills | Status: DC

## 2021-05-05 MED ORDER — LEVETIRACETAM 500 MG PO TABS: 500.0000 mg | ORAL_TABLET | Freq: Two times a day (BID) | ORAL | 1 refills | Status: DC

## 2021-05-05 NOTE — Discharge Summary (Signed)
Physician Discharge Summary  DORON SHAKE QPY:[id number] DOB: 08/08/1956 DOA: 04/30/2021  PCP: [name], MD  Admit date: 04/30/2021 Discharge date: 05/05/2021  Admitted From: Home Disposition: Home   Recommendations for Outpatient Follow-up:  -Continue Keppra on discharge. -Have patient f/up with [facility] 4 weeks after discharge. -Recommend formal neurological cognitive testing (this has already been mentioned in out patient neurology notes). -F/U autoimmune encephalitis panel as outpatient. - seizure precautions. Home safety seizure precautions.     Home Health:NO Equipment/Devices:No  Discharge Condition:Stable CODE STATUS:FULL Diet recommendation: Heart Healthy   Brief/Interim Summary:  Acute metabolic encephalopathy -Work-up diagnosis on discharge is autoimmune encephalitis versus less likely seizures (Patient and wife at bedside were counseled at length about seizures restrictions on discharge) -Patient presented with confusion more so than he has been.  Per wife, he is supposed to follow-up with neurology for mental status evaluation in August. -MRI brain showed significant abnormal signal in the left temporal lobe, differential includes subacute infarction, encephalitis versus seizure -MRI brain with contrast showed no evidence normal or pathological enhancement seen within the brain -Currently patient is alert and oriented x3 -Patient with no neurological focal deficits at this time -IR consulted for LP (LP attempted in the ED was unsuccessful)-not consistent with bacterial infection, therefore ampicillin, ceftriaxone and vancomycin have been discontinued -HSV was negative, VDRL nonreactive, so acyclovir was stopped -EEG showed mild diffuse slowing indicating global cerebral dysfunction.  Focal slowing indicating superimposed focal dysfunction in the lef frontotemporal region. -Pending autoimmune workup  -Neurology consulted and appreciated, patient is  treated with IV Solu-Medrol 1000 mg oral daily, to finish total of 30 days, tomorrow he will receive it in the infusion center, gratefully it has been arranged by neurology -B12 on the lower side, he will be discharged on supplements   Essential hypertension -Antihypertensives currently held (metoprolol, HCTZ and losartan) during hospital stay given soft blood pressure, it is started to increase at time of discharge, so losartan has been resumed, patient heart rate on the lower side, so metoprolol need to be held. -Norvasc was recently discontinued by his PCP   Obstructive sleep apnea -Continue CPAP   History of CVA -Continue with aspirin   History of ulcerative colitis -Continue mesalamine   Hypokalemia -replaced  Discharge Diagnoses:  Active Problems:   OSA (obstructive sleep apnea)   Cerebral embolism with cerebral infarction   Acute metabolic encephalopathy   Encephalitis   Acid reflux    Discharge Instructions  Discharge Instructions     Diet - low sodium heart healthy   Complete by: As directed    Discharge instructions   Complete by: As directed    Patient will receive last dose of Solu-Medrol tomorrow in out patient infusion clinic.  Phone # [phone].  neurology have spoken to the infusion clinic RN-he is scheduled for 10 AM on 05/06/2021 as outpatient.   Increase activity slowly   Complete by: As directed       Allergies as of 05/05/2021   No Known Allergies      Medication List     STOP taking these medications    amLODipine 10 MG tablet Commonly known as: NORVASC   aspirin 325 MG tablet Replaced by: aspirin EC 81 MG tablet   diphenhydrAMINE 25 MG tablet Commonly known as: SOMINEX   etodolac 400 MG tablet Commonly known as: LODINE   losartan 100 MG tablet Commonly known as: COZAAR   metoprolol succinate 25 MG 24 hr tablet Commonly known as: TOPROL-XL  predniSONE 20 MG tablet Commonly known as: DELTASONE       TAKE these  medications    aspirin EC 81 MG tablet Take 1 tablet (81 mg total) by mouth daily. Swallow whole. Replaces: aspirin 325 MG tablet   atorvastatin 40 MG tablet Commonly known as: LIPITOR TAKE ONE TABLET BY MOUTH AT 6PM What changed: See the new instructions.   diphenhydrAMINE 25 MG tablet Commonly known as: BENADRYL Take 25 mg by mouth at bedtime as needed.   escitalopram 20 MG tablet Commonly known as: LEXAPRO Take 1 tablet (20 mg total) by mouth at bedtime.   hydrochlorothiazide 25 MG tablet Commonly known as: HYDRODIURIL Take 25 mg by mouth daily.   levETIRAcetam 500 MG tablet Commonly known as: KEPPRA Take 1 tablet (500 mg total) by mouth 2 (two) times daily.   Mesalamine 400 MG Cpdr DR capsule Commonly known as: Delzicol Take 2 capsules (800 mg total) by mouth 2 (two) times daily.   pantoprazole 40 MG tablet Commonly known as: PROTONIX Take 1 tablet (40 mg total) by mouth daily. Start taking on: May 06, 2021   vitamin B-12 500 MCG tablet Commonly known as: CYANOCOBALAMIN Take 1 tablet (500 mcg total) by mouth daily.   vitamin C 100 MG tablet Take 100 mg by mouth daily. OTC        Follow-up Information     [name], MD Follow up in 1 week(s).   Specialty: Family Medicine Contact information: [address] [place] [address] [phone]                No Known Allergies  Consultations: Neurology   Procedures/Studies: DG Chest 2 View  Result Date: 04/30/2021 CLINICAL DATA:  Cough and altered mental status. EXAM: CHEST - 2 VIEW COMPARISON:  Chest x-ray dated December 02, 2020. FINDINGS: The heart size and mediastinal contours are within normal limits. Both lungs are clear. The visualized skeletal structures are unremarkable. IMPRESSION: No active cardiopulmonary disease. Electronically Signed   By: Titus Dubin M.D.   On: 04/30/2021 14:59   CT Head Wo Contrast  Result Date: 04/30/2021 CLINICAL DATA:  Mental status  change with increased confusion. History of stroke 3 years ago. EXAM: CT HEAD WITHOUT CONTRAST TECHNIQUE: Contiguous axial images were obtained from the base of the skull through the vertex without intravenous contrast. COMPARISON:  CT head 04/13/2018.  Head MRI 05/29/2020. FINDINGS: Brain: There is no evidence of acute intracranial hemorrhage, mass lesion, brain edema or extra-axial fluid collection. Stable mild atrophy with mild prominence of the ventricles and subarachnoid spaces. Stable mild chronic small vessel ischemic changes in the periventricular white matter. There is no CT evidence of acute cortical infarction. Vascular: Intracranial vascular calcifications. No hyperdense vessel identified. Skull: Negative for fracture or suspicious focal lesion. Stable benign lucencies in the frontal bone bilaterally. Sinuses/Orbits: The visualized paranasal sinuses and mastoid air cells are clear. No orbital abnormalities are seen. Other: None. IMPRESSION: Stable head CT without acute intracranial findings. Mild atrophy and chronic small vessel ischemic changes. Electronically Signed   By: Richardean Sale M.D.   On: 04/30/2021 14:05   MR BRAIN WO CONTRAST  Result Date: 04/30/2021 CLINICAL DATA:  Increasing memory problems EXAM: MRI HEAD WITHOUT CONTRAST TECHNIQUE: Multiplanar, multiecho pulse sequences of the brain and surrounding structures were obtained without intravenous contrast. COMPARISON:  Images only 05/29/2020.  Dictation by non-radiologist. FINDINGS: Brain: There is abnormal diffusion hyperintensity along the head and body of the left hippocampus.  Probable asymmetric involvement of the uncus as well. Suspect subtle corresponding T2 FLAIR hyperintensity. No mass effect. No evidence of intracranial hemorrhage. Patchy additional foci of T2 hyperintensity in the supratentorial white matter are nonspecific but may reflect minor chronic microvascular ischemic changes. Prominence of the ventricles and sulci  reflects generalized parenchymal volume loss. There is no hydrocephalus or extra-axial fluid collection. Vascular: Major vessel flow voids at the skull base are preserved. Skull and upper cervical spine: Normal marrow signal is preserved. Sinuses/Orbits: Paranasal sinuses are aerated. Orbits are unremarkable. Other: Sella is unremarkable.  Mastoid air cells are clear. IMPRESSION: Abnormal signal involving the mesial left temporal lobe. Differential considerations include subacute infarction, encephalitis (autoimmune or early changes of herpes), and seizure effect. There is no expansion or mass effect suggest glioma. Follow-up is recommended. CSF sampling could be considered depending on clinical suspicion. Electronically Signed   By: Macy Mis M.D.   On: 04/30/2021 16:12   MR BRAIN W CONTRAST  Result Date: 05/02/2021 CLINICAL DATA:  Initial evaluation for brain mass or lesion. EXAM: MRI HEAD WITH CONTRAST TECHNIQUE: Multiplanar, multiecho pulse sequences of the brain and surrounding structures were obtained with intravenous contrast. CONTRAST:  51m GADAVIST GADOBUTROL 1 MMOL/ML IV SOLN COMPARISON:  Prior noncontrast MRI from 04/30/2021. FINDINGS: Brain: Postcontrast imaging of the brain only was performed. No abnormal or pathologic enhancement seen within the brain. Particularly, no abnormal enhancement seen about the previously seen signal abnormality about the mesial left temporal lobe. No other new or progressive finding. Vascular: Normal intravascular enhancement seen throughout the brain. Skull and upper cervical spine: Unremarkable. Sinuses/Orbits: Unremarkable. Other: None. IMPRESSION: No abnormal or pathologic enhancement seen within the brain. Electronically Signed   By: BJeannine BogaM.D.   On: 05/02/2021 02:42   EEG adult  Result Date: 05/01/2021 SDerek Jack MD     05/01/2021  8:20 PM Routine EEG Report KALAZAR CHERIANis a 65y.o. male with a history of encephalopathy who is  undergoing an EEG to evaluate for seizures. Report: This EEG was acquired with electrodes placed according to the International 10-20 electrode system (including Fp1, Fp2, F3, F4, C3, C4, P3, P4, O1, O2, T3, T4, T5, T6, A1, A2, Fz, Cz, Pz). The following electrodes were missing or displaced: none. The occipital dominant rhythm was 7-8 Hz with overriding beta frequencies. This activity is reactive to stimulation. Drowsiness was manifested by background fragmentation; deeper stages of sleep were not identified. There was focal slowing over the left frontotemporal region. There were no interictal epileptiform discharges. There were no electrographic seizures identified. Photic stimulation and hyperventilation were not performed. Impression: This EEG was obtained while awake and drowsy and is abnormal due to - mild diffuse slowing indicating global cerebral dysfunction - focal slowing indicating superimposed focal dysfunction in the left frontotemporal region. CSu Monks MD [facility] [phone] 7pm- 7am, please page neurology on call as listed in [place]   DG FL GUIDED LUMBAR PUNCTURE  Result Date: 05/01/2021 CLINICAL DATA:  Altered mental status and question of encephalitis. EXAM: DIAGNOSTIC LUMBAR PUNCTURE UNDER FLUOROSCOPIC GUIDANCE COMPARISON:  Preceding brain MRI FLUOROSCOPY TIME:  Fluoroscopy Time:  0.5 minutes Radiation Exposure Index (if provided by the fluoroscopic device): 2.9 mGy air kerma Number of Acquired Spot Images: 0 PROCEDURE: Informed consent was obtained from the patient and his wife prior to the procedure. The patient was able to sign for himself. With the patient prone, the lower back was prepped with Betadine. 1% Lidocaine was used for  local anesthesia. Lumbar puncture was performed at the L5-S1 level using a 20 gauge needle with return of clear CSF with an opening pressure of 17 cm water. As requested, 30 ml of CSF were obtained for laboratory studies. The patient  tolerated the procedure well and there were no apparent complications. IMPRESSION: Uncomplicated lumbar puncture. Electronically Signed   By: Monte Fantasia M.D.   On: 05/01/2021 11:15      Subjective:  Patient denies any complaints today, no nausea, no vomiting, per wife at his bedside he is pretty much around baseline. Discharge Exam: Vitals:   05/05/21 0600 05/05/21 0810  BP: 131/78 (!) 155/89  Pulse: (!) 53 (!) 51  Resp: 18 16  Temp: 97.6 F (36.4 C) 98.4 F (36.9 C)  SpO2: 94% 95%   Vitals:   05/04/21 2100 05/05/21 0200 05/05/21 0600 05/05/21 0810  BP: (!) 145/91 93/64 131/78 (!) 155/89  Pulse: (!) 55 60 (!) 53 (!) 51  Resp: 18 18 18 16   Temp: 98.5 F (36.9 C) 97.8 F (36.6 C) 97.6 F (36.4 C) 98.4 F (36.9 C)  TempSrc: Oral Oral Oral Oral  SpO2: 96% 96% 94% 95%  Weight:      Height:        General: Pt is alert, awake, not in acute distress, he is awake alert x3, coherent Cardiovascular: RRR, S1/S2 +, no rubs, no gallops Respiratory: CTA bilaterally, no wheezing, no rhonchi Abdominal: Soft, NT, ND, bowel sounds + Extremities: no edema, no cyanosis    The results of significant diagnostics from this hospitalization (including imaging, microbiology, ancillary and laboratory) are listed below for reference.     Microbiology: Recent Results (from the past 240 hour(s))  Resp Panel by RT-PCR (Flu A&B, Covid) Nasopharyngeal Swab     Status: None   Collection Time: 04/30/21  5:15 PM   Specimen: Nasopharyngeal Swab; Nasopharyngeal(NP) swabs in vial transport medium  Result Value Ref Range Status   SARS Coronavirus 2 by RT PCR NEGATIVE NEGATIVE Final    Comment: (NOTE) SARS-CoV-2 target nucleic acids are NOT DETECTED.  The SARS-CoV-2 RNA is generally detectable in upper respiratory specimens during the acute phase of infection. The lowest concentration of SARS-CoV-2 viral copies this assay can detect is 138 copies/mL. A negative result does not preclude  SARS-Cov-2 infection and should not be used as the sole basis for treatment or other patient management decisions. A negative result may occur with  improper specimen collection/handling, submission of specimen other than nasopharyngeal swab, presence of viral mutation(s) within the areas targeted by this assay, and inadequate number of viral copies(<138 copies/mL). A negative result must be combined with clinical observations, patient history, and epidemiological information. The expected result is Negative.  Fact Sheet for Patients:  EntrepreneurPulse.com.au  Fact Sheet for Healthcare Providers:  IncredibleEmployment.be  This test is no t yet approved or cleared by the [place] FDA and  has been authorized for detection and/or diagnosis of SARS-CoV-2 by FDA under an Emergency Use Authorization (EUA). This EUA will remain  in effect (meaning this test can be used) for the duration of the COVID-19 declaration under Section 564(b)(1) of the Act, 21 U.S.C.section 360bbb-3(b)(1), unless the authorization is terminated  or revoked sooner.       Influenza A by PCR NEGATIVE NEGATIVE Final   Influenza B by PCR NEGATIVE NEGATIVE Final    Comment: (NOTE) The Xpert Xpress SARS-CoV-2/FLU/RSV plus assay is intended as an aid in the diagnosis of influenza from Nasopharyngeal swab specimens  and should not be used as a sole basis for treatment. Nasal washings and aspirates are unacceptable for Xpert Xpress SARS-CoV-2/FLU/RSV testing.  Fact Sheet for Patients: EntrepreneurPulse.com.au  Fact Sheet for Healthcare Providers: IncredibleEmployment.be  This test is not yet approved or cleared by the [place] FDA and has been authorized for detection and/or diagnosis of SARS-CoV-2 by FDA under an Emergency Use Authorization (EUA). This EUA will remain in effect (meaning this test can be used) for the duration of  the COVID-19 declaration under Section 564(b)(1) of the Act, 21 U.S.C. section 360bbb-3(b)(1), unless the authorization is terminated or revoked.  Performed at [facility], [address] [place], [place] [address]   Blood culture (routine x 2)     Status: None (Preliminary result)   Collection Time: 04/30/21  6:19 PM   Specimen: BLOOD RIGHT ARM  Result Value Ref Range Status   Specimen Description BLOOD RIGHT ARM  Final   Special Requests   Final    Blood Culture adequate volume BOTTLES DRAWN AEROBIC AND ANAEROBIC   Culture   Final    NO GROWTH 4 DAYS Performed at [facility], [address] [place], [place] [address]    Report Status PENDING  Incomplete  Blood culture (routine x 2)     Status: None (Preliminary result)   Collection Time: 04/30/21  6:29 PM   Specimen: Left Antecubital; Blood  Result Value Ref Range Status   Specimen Description LEFT ANTECUBITAL  Final   Special Requests   Final    Blood Culture results may not be optimal due to an excessive volume of blood received in culture bottles BOTTLES DRAWN AEROBIC AND ANAEROBIC   Culture   Final    NO GROWTH 4 DAYS Performed at [facility], [address], [place], [place] [address]    Report Status PENDING  Incomplete  CSF culture w Gram Stain     Status: None   Collection Time: 04/30/21 11:33 PM   Specimen: CSF; Cerebrospinal Fluid  Result Value Ref Range Status   Specimen Description CSF  Final   Special Requests Normal  Final   Gram Stain   Final    WBC PRESENT,BOTH PMN AND MONONUCLEAR NO ORGANISMS SEEN CYTOSPIN SMEAR    Culture   Final    NO GROWTH 3 DAYS Performed at [facility] [address] [place],  [address]    Report Status 05/04/2021 FINAL  Final     Labs: BNP (last 3 results) No results for input(s): BNP in the last 8760 hours. Basic Metabolic Panel: Recent Labs  Lab 04/30/21 1344 05/01/21 0136 05/04/21 0201 05/05/21 0158  NA 134* 136 136 136  K 3.7 4.1 3.2* 3.6  CL  103 104 106 105  CO2 25 24 24 24   GLUCOSE 128* 102* 86 157*  BUN 18 15 12 12   CREATININE 0.98 0.97 0.93 0.95  CALCIUM 9.2 8.4* 8.6* 8.9  MG  --   --   --  2.0   Liver Function Tests: Recent Labs  Lab 04/30/21 1344 05/01/21 0136  AST 30 20  ALT 32 20  ALKPHOS 92 64  BILITOT 1.2 1.2  PROT 7.3 5.7*  ALBUMIN 4.2 3.2*   No results for input(s): LIPASE, AMYLASE in the last 168 hours. Recent Labs  Lab 05/02/21 1442  AMMONIA 19   CBC: Recent Labs  Lab 04/30/21 1344 05/01/21 0136 05/05/21 0158  WBC 11.9* 13.2* 7.2  NEUTROABS 10.3*  --   --  HGB 15.9 13.9 14.1  HCT 46.9 41.6 40.9  MCV 96.9 96.5 93.4  PLT 304 290 283   Cardiac Enzymes: No results for input(s): CKTOTAL, CKMB, CKMBINDEX, TROPONINI in the last 168 hours. BNP: Invalid input(s): POCBNP CBG: No results for input(s): GLUCAP in the last 168 hours. D-Dimer No results for input(s): DDIMER in the last 72 hours. Hgb A1c No results for input(s): HGBA1C in the last 72 hours. Lipid Profile No results for input(s): CHOL, HDL, LDLCALC, TRIG, CHOLHDL, LDLDIRECT in the last 72 hours. Thyroid function studies Recent Labs    05/02/21 1442  TSH 1.334   Anemia work up Recent Labs    05/02/21 1442  VITAMINB12 279  FOLATE 9.7   Urinalysis    Component Value Date/Time   COLORURINE YELLOW 04/30/2021 [place] 04/30/2021 1709   LABSPEC 1.015 04/30/2021 1709   PHURINE 6.0 04/30/2021 1709   GLUCOSEU NEGATIVE 04/30/2021 1709   HGBUR NEGATIVE 04/30/2021 1709   BILIRUBINUR NEGATIVE 04/30/2021 [place] 04/30/2021 1709   PROTEINUR NEGATIVE 04/30/2021 1709   NITRITE NEGATIVE 04/30/2021 1709   LEUKOCYTESUR NEGATIVE 04/30/2021 1709   Sepsis Labs Invalid input(s): PROCALCITONIN,  WBC,  LACTICIDVEN Microbiology Recent Results (from the past 240 hour(s))  Resp Panel by RT-PCR (Flu A&B, Covid) Nasopharyngeal Swab     Status: None   Collection Time: 04/30/21  5:15 PM   Specimen:  Nasopharyngeal Swab; Nasopharyngeal(NP) swabs in vial transport medium  Result Value Ref Range Status   SARS Coronavirus 2 by RT PCR NEGATIVE NEGATIVE Final    Comment: (NOTE) SARS-CoV-2 target nucleic acids are NOT DETECTED.  The SARS-CoV-2 RNA is generally detectable in upper respiratory specimens during the acute phase of infection. The lowest concentration of SARS-CoV-2 viral copies this assay can detect is 138 copies/mL. A negative result does not preclude SARS-Cov-2 infection and should not be used as the sole basis for treatment or other patient management decisions. A negative result may occur with  improper specimen collection/handling, submission of specimen other than nasopharyngeal swab, presence of viral mutation(s) within the areas targeted by this assay, and inadequate number of viral copies(<138 copies/mL). A negative result must be combined with clinical observations, patient history, and epidemiological information. The expected result is Negative.  Fact Sheet for Patients:  EntrepreneurPulse.com.au  Fact Sheet for Healthcare Providers:  IncredibleEmployment.be  This test is no t yet approved or cleared by the [place] FDA and  has been authorized for detection and/or diagnosis of SARS-CoV-2 by FDA under an Emergency Use Authorization (EUA). This EUA will remain  in effect (meaning this test can be used) for the duration of the COVID-19 declaration under Section 564(b)(1) of the Act, 21 U.S.C.section 360bbb-3(b)(1), unless the authorization is terminated  or revoked sooner.       Influenza A by PCR NEGATIVE NEGATIVE Final   Influenza B by PCR NEGATIVE NEGATIVE Final    Comment: (NOTE) The Xpert Xpress SARS-CoV-2/FLU/RSV plus assay is intended as an aid in the diagnosis of influenza from Nasopharyngeal swab specimens and should not be used as a sole basis for treatment. Nasal washings and aspirates are unacceptable for  Xpert Xpress SARS-CoV-2/FLU/RSV testing.  Fact Sheet for Patients: EntrepreneurPulse.com.au  Fact Sheet for Healthcare Providers: IncredibleEmployment.be  This test is not yet approved or cleared by the [place] FDA and has been authorized for detection and/or diagnosis of SARS-CoV-2 by FDA under an Emergency Use Authorization (EUA). This EUA will remain in effect (meaning this  test can be used) for the duration of the COVID-19 declaration under Section 564(b)(1) of the Act, 21 U.S.C. section 360bbb-3(b)(1), unless the authorization is terminated or revoked.  Performed at [facility], [address], [place], [place] [address]   Blood culture (routine x 2)     Status: None (Preliminary result)   Collection Time: 04/30/21  6:19 PM   Specimen: BLOOD RIGHT ARM  Result Value Ref Range Status   Specimen Description BLOOD RIGHT ARM  Final   Special Requests   Final    Blood Culture adequate volume BOTTLES DRAWN AEROBIC AND ANAEROBIC   Culture   Final    NO GROWTH 4 DAYS Performed at [facility], [address] [place], [place] [address]    Report Status PENDING  Incomplete  Blood culture (routine x 2)     Status: None (Preliminary result)   Collection Time: 04/30/21  6:29 PM   Specimen: Left Antecubital; Blood  Result Value Ref Range Status   Specimen Description LEFT ANTECUBITAL  Final   Special Requests   Final    Blood Culture results may not be optimal due to an excessive volume of blood received in culture bottles BOTTLES DRAWN AEROBIC AND ANAEROBIC   Culture   Final    NO GROWTH 4 DAYS Performed at [facility], [address] [place], [place] [address]    Report Status PENDING  Incomplete  CSF culture w Gram Stain     Status: None   Collection Time: 04/30/21 11:33 PM   Specimen: CSF; Cerebrospinal Fluid  Result Value Ref Range Status   Specimen Description CSF  Final   Special Requests Normal  Final   Gram Stain   Final     WBC PRESENT,BOTH PMN AND MONONUCLEAR NO ORGANISMS SEEN CYTOSPIN SMEAR    Culture   Final    NO GROWTH 3 DAYS Performed at [facility] [address], [place], [place] [address]    Report Status 05/04/2021 FINAL  Final     Time coordinating discharge: Over 30 minutes  SIGNED:   Phillips Climes, MD  [facility] 05/05/2021, 11:14 AM Pager   If 7PM-7AM, please contact night-coverage [email or web address] Password [email or web address]

## 2021-05-05 NOTE — Progress Notes (Signed)
Discharge instructions given to patient and wife, questions answered. Patient transported out to car via wheelchair with belongings.

## 2021-05-05 NOTE — Discharge Instructions (Addendum)
  Per [facility] statutes, patients with seizures are not allowed to drive until they have been seizure-free for six months.   Use caution when using heavy equipment or power tools. Avoid working on ladders or at heights. Take showers instead of baths. Ensure the water temperature is not too high on the home water heater. Do not go swimming alone. Do not lock yourself in a room alone (i.e. bathroom). When caring for infants or small children, sit down when holding, feeding, or changing them to minimize risk of injury to the child in the event you have a seizure. Maintain good sleep hygiene. Avoid alcohol.   Follow with Primary MD [name], MD in 7 days   Get CBC, CMP, checked  by Primary MD next visit.    Activity: As tolerated with Full fall precautions use walker/cane & assistance as needed   Disposition Home    Do not drive, operating heavy machinery, perform activities at heights, swimming or participation in water activities or provide baby sitting services as you were admitted for siezures until you have seen by Primary MD or a Neurologist and advised to do so again.  Diet: Heart Healthy , with feeding assistance and aspiration precautions.  On your next visit with your primary care physician please Get Medicines reviewed and adjusted.   Please request your Prim.MD to go over all Hospital Tests and Procedure/Radiological results at the follow up, please get all Hospital records sent to your Prim MD by signing hospital release before you go home.   If you experience worsening of your admission symptoms, develop shortness of breath, life threatening emergency, suicidal or homicidal thoughts you must seek medical attention immediately by calling 911 or calling your MD immediately  if symptoms less severe.  You Must read complete instructions/literature along with all the possible adverse reactions/side effects for all the Medicines you take and that have been  prescribed to you. Take any new Medicines after you have completely understood and accpet all the possible adverse reactions/side effects.    Do not drive when taking Pain medications.    Do not take more than prescribed Pain, Sleep and Anxiety Medications  Special Instructions: If you have smoked or chewed Tobacco  in the last 2 yrs please stop smoking, stop any regular Alcohol  and or any Recreational drug use.  Wear Seat belts while driving.   Please note  You were cared for by a hospitalist during your hospital stay. If you have any questions about your discharge medications or the care you received while you were in the hospital after you are discharged, you can call the unit and asked to speak with the hospitalist on call if the hospitalist that took care of you is not available. Once you are discharged, your primary care physician will handle any further medical issues. Please note that NO REFILLS for any discharge medications will be authorized once you are discharged, as it is imperative that you return to your primary care physician (or establish a relationship with a primary care physician if you do not have one) for your aftercare needs so that they can reassess your need for medications and monitor your lab values.

## 2021-05-05 NOTE — Progress Notes (Signed)
Neurology Progress Note  S: Feels fine. Wife states he still has slight change of behavior at night, but she thinks he is doing much better overall. More clear during the day. No HA, n/v, pain, weakness, numbness or tingling.   O: Current vital signs: BP (!) 155/89 (BP Location: Left Arm)   Pulse (!) 51   Temp 98.4 F (36.9 C) (Oral)   Resp 16   Ht 6' 1"  (1.854 m)   Wt 93 kg   SpO2 95%   BMI 27.05 kg/m  Vital signs in last 24 hours: Temp:  [97.6 F (36.4 C)-98.6 F (37 C)] 98.4 F (36.9 C) (06/29 0810) Pulse Rate:  [51-60] 51 (06/29 0810) Resp:  [16-19] 16 (06/29 0810) BP: (93-155)/(64-91) 155/89 (06/29 0810) SpO2:  [94 %-97 %] 95 % (06/29 0810)  GENERAL: Well appearing male. Awake, alert in NAD. Sitting up on side of bed eating breakfast.  HEENT: Normocephalic and atraumatic. LUNGS: Normal respiratory effort.  Ext: warm. Psych: Light affect. Continue to laugh inappropriately at times and when covering up not knowing answers to questions.    NEURO:  Alert. Oriented to self, age, birthday (tomorrow), wife, month, year, place. Not oriented to day or date.  Speech/Language: speech is without aphasia or dysarthria.  Naming, repetition, fluency, and comprehension intact with exception of stating a pen is for paying bills, but can not state writing. He is able to tell NP how many quarters are in $2.75 today.   Cranial Nerves:  II: PERRL.  III, IV, VI: EOMI. Eyelids elevate symmetrically.  V: Sensation is intact to light touch and symmetrical to face.  VII: Smile is symmetrical.  VIII: hearing intact to voice. IX, X: Palate elevates symmetrically. Phonation is normal.  XB:MWUXLKGM shrug 5/5. XII: tongue is midline without fasciculations. Motor: 5/5 strength to all muscle groups tested.  Tone: is normal and bulk is normal. Sensation- Intact to light touch bilaterally. Extinction absent to light touch to DSS.    Coordination: FTN intact bilaterally. No drift.  DTRs: 2+  throughout. Gait- deferred.  Medications  Current Facility-Administered Medications:    0.9 %  sodium chloride infusion, , Intravenous, Continuous, Elgergawy, Silver Huguenin, MD, Last Rate: 100 mL/hr at 05/05/21 0058, New Bag at 05/05/21 0058   atorvastatin (LIPITOR) tablet 40 mg, 40 mg, Oral, Daily, Cristal Ford, DO, 40 mg at 05/04/21 0102   cyanocobalamin ((VITAMIN B-12)) injection 1,000 mcg, 1,000 mcg, Intramuscular, Daily, 1,000 mcg at 05/04/21 0817 **FOLLOWED BY** [START ON 05/09/2021] vitamin B-12 (CYANOCOBALAMIN) tablet 1,000 mcg, 1,000 mcg, Oral, Daily, Donnetta Simpers, MD   levETIRAcetam (KEPPRA) tablet 500 mg, 500 mg, Oral, BID, Greta Doom, MD, 500 mg at 05/04/21 2106   LORazepam (ATIVAN) injection 1 mg, 1 mg, Intravenous, Q4H PRN, Kirby-Graham, Karsten Fells, NP   Mesalamine (ASACOL) DR capsule 800 mg, 800 mg, Oral, BID, Cristal Ford, DO, 800 mg at 05/04/21 2106   methylPREDNISolone sodium succinate (SOLU-MEDROL) 1,000 mg in sodium chloride 0.9 % 50 mL IVPB, 1,000 mg, Intravenous, Daily, Cristal Ford, DO, Last Rate: 58 mL/hr at 05/04/21 1731, 1,000 mg at 05/04/21 1731   pantoprazole (PROTONIX) EC tablet 40 mg, 40 mg, Oral, Daily, Elgergawy, Silver Huguenin, MD   thiamine (B-1) injection 100 mg, 100 mg, Intravenous, Daily **OR** thiamine tablet 100 mg, 100 mg, Oral, Daily, Donnetta Simpers, MD, 100 mg at 05/04/21 7253  Pertinent Labs: autoimmune encephalitis panel pending.   No new Imaging.  Assessment: 65 yo male who presented 4 days ago with confusion.  He was treated empirically with Acyclovir awaiting HSV which has resulted negative. Suspicion was and still is for underlying dementia. Due to questionable status epilepticus on admission, will continue Keppra. Due to continued consideration of autoimmune encephalitis, he was started on steroids. Today is Day 2/3. The autoimmune panel is still pending and will take awhile due to send out lab. However, his exam has improved some  compared to NPs last exam.   Impression: -cognitive deficit, suspect underlying dementia.  -possible autoimmune encephalitis with labs pending.   Plan:  -Patient may be discharged from a neurological standpoint.  -Solumedrol 1037m IV total 3 doses-received 1 in the hospital already, will receive 1 later in the day today and last dose tomorrow in out patient infusion clinic.  Phone # [phone]  I have spoken to the infusion clinic RN-he is scheduled for 10 AM on 05/06/2021 as outpatient.  Outpatient Solu-Medrol prescription/order has also been placed. He should come to the main entrance of the hospital, go to the registration and tell them that he is here for infusion and they will direct him to the infusion clinic. -Continue Keppra on discharge.  [facility]  -Recommend formal neurological cognitive testing (this has already been mentioned in out patient neurology notes). -F/U autoimmune encephalitis panel as outpatient. -[place] seizure precautions. Home safety seizure precautions.   [facility], patients with seizures are not allowed to drive until they have been seizure-free for six months.   Use caution when using heavy equipment or power tools. Avoid working on ladders or at heights. Take showers instead of baths. Ensure the water temperature is not too high on the home water heater. Do not go swimming alone. Do not lock yourself in a room alone (i.e. bathroom). When caring for infants or small children, sit down when holding, feeding, or changing them to minimize risk of injury to the child in the event you have a seizure. Maintain good sleep hygiene. Avoid alcohol.    If patient has another seizure, call 911 and bring them back to the ED if: A.  The seizure lasts longer than 5 minutes.      B.  The patient doesn't wake shortly after the seizure or has new problems such as difficulty seeing, speaking or moving  following the seizure C.  The patient was injured during the seizure D.  The patient has a temperature over 102 F (39C) E.  The patient vomited during the seizure and now is having trouble breathing.   Patient seen by KClance Boll NP and by attending. Edit of note and plan or care per attending.    Attending addendum Patient seen and examined Agree with examination above I suspect underlying progressive cognitive deficits/dementia but the imaging findings were concerning for mesial temporal sclerosis without any obvious seizure activity with improvement with benzos and Keppra as well as steroids. He will be completing 3 days of IV steroids-Solu-Medrol 1 g daily and has been started on Keppra which she will continue to take outpatient. Should follow with outpatient neurology as above. Seizure precautions as above Plan communicated to Dr. EWaldron Labsover the phone  -- AAmie Portland MD Neurologist [facility] Pager: [phone]

## 2021-05-06 ENCOUNTER — Ambulatory Visit (HOSPITAL_COMMUNITY)
Admission: RE | Admit: 2021-05-06 | Discharge: 2021-05-06 | Disposition: A | Discharge status: Home / Self Care | Payer: Medicare Other | Source: Ambulatory Visit | Attending: Student in an Organized Health Care Education/Training Program | Admitting: Student in an Organized Health Care Education/Training Program

## 2021-05-06 ENCOUNTER — Telehealth: Payer: Self-pay | Admitting: Family Medicine

## 2021-05-06 ENCOUNTER — Other Ambulatory Visit: Payer: Self-pay

## 2021-05-06 DIAGNOSIS — G9341 Metabolic encephalopathy: Secondary | ICD-10-CM | POA: Insufficient documentation

## 2021-05-06 LAB — CULTURE, BLOOD (ROUTINE X 2)
Culture: NO GROWTH
Culture: NO GROWTH
Special Requests: ADEQUATE

## 2021-05-06 MED ORDER — SODIUM CHLORIDE 0.9 % IV SOLN
1000.0000 mg | Freq: Once | INTRAVENOUS | Status: AC
  Administered 2021-05-06: 1000 mg via INTRAVENOUS
  Filled 2021-05-06: qty 8

## 2021-05-06 NOTE — Telephone Encounter (Signed)
Transition Care Management Unsuccessful Follow-up Telephone Call  Date of discharge and from where:  05/05/2021 from [facility]   Attempts:  1st Attempt  Reason for unsuccessful TCM follow-up call:  Left voice message

## 2021-05-07 ENCOUNTER — Encounter: Payer: Self-pay | Admitting: Family Medicine

## 2021-05-07 NOTE — Telephone Encounter (Signed)
Transition Care Management Unsuccessful Follow-up Telephone Call  Date of discharge and from where:  05/05/2021 from [facility]   Attempts:  2nd Attempt  Reason for unsuccessful TCM follow-up call:  Left voice message

## 2021-05-07 NOTE — Telephone Encounter (Signed)
Transition Care Management Follow-up Telephone Call Date of discharge and from where: 05/05/2021 from [facility]  How have you been since you were released from the hospital? Patient states he is doing better. Has been sleeping well. Any questions or concerns? Yes has concerns of blood pressure   Items Reviewed: Did the pt receive and understand the discharge instructions provided? Yes  Medications obtained and verified? Yes  Other? No  Any new allergies since your discharge? No  Dietary orders reviewed? Yes Do you have support at home? Yes   Home Care and Equipment/Supplies: Were home health services ordered? not applicable If so, what is the name of the agency? N/A   Has the agency set up a time to come to the patient's home? not applicable Were any new equipment or medical supplies ordered?  No What is the name of the medical supply agency? N/A  Were you able to get the supplies/equipment? not applicable Do you have any questions related to the use of the equipment or supplies? No  Functional Questionnaire: (I = Independent and D = Dependent) ADLs: I  Bathing/Dressing- I  Meal Prep- I  Eating- I  Maintaining continence- I  Transferring/Ambulation- I  Managing Meds- I  Follow up appointments reviewed:  PCP Hospital f/u appt confirmed? Yes  Scheduled to see Dr. Dennard Schaumann  on 05/18/2021 @ 4:00 PM . [facility] f/u appt confirmed? No   Are transportation arrangements needed? No  If their condition worsens, is the pt aware to call PCP or go to the Emergency Dept.? Yes Was the patient provided with contact information for the PCP's office or ED? Yes Was to pt encouraged to call back with questions or concerns? Yes,

## 2021-05-08 LAB — METHYLMALONIC ACID, SERUM: Methylmalonic Acid, Quantitative: 335 nmol/L (ref 0–378)

## 2021-05-10 ENCOUNTER — Other Ambulatory Visit: Payer: Self-pay

## 2021-05-10 ENCOUNTER — Ambulatory Visit
Admission: EM | Admit: 2021-05-10 | Discharge: 2021-05-10 | Disposition: A | Discharge status: Home / Self Care | Payer: Medicare Other | Attending: Family Medicine | Admitting: Family Medicine

## 2021-05-10 ENCOUNTER — Encounter: Payer: Self-pay | Admitting: Emergency Medicine

## 2021-05-10 DIAGNOSIS — S46912A Strain of unspecified muscle, fascia and tendon at shoulder and upper arm level, left arm, initial encounter: Secondary | ICD-10-CM

## 2021-05-10 DIAGNOSIS — M25512 Pain in left shoulder: Secondary | ICD-10-CM

## 2021-05-10 MED ORDER — DEXAMETHASONE SODIUM PHOSPHATE 10 MG/ML IJ SOLN
10.0000 mg | Freq: Once | INTRAMUSCULAR | Status: AC
  Administered 2021-05-10: 10 mg via INTRAMUSCULAR

## 2021-05-10 MED ORDER — LIDO-CAPSAICIN-MEN-METHYL SAL 0.5-0.035-5-20 % EX PTCH
1.0000 | MEDICATED_PATCH | Freq: Every day | CUTANEOUS | 0 refills | Status: DC | PRN
Start: 2021-05-10 — End: 2021-07-15

## 2021-05-10 NOTE — ED Triage Notes (Addendum)
LT shoulder pain with movement after trimming some shrubs that started 1-2 days ago.

## 2021-05-10 NOTE — Discharge Instructions (Addendum)
Continue Tylenol along with lidocaine patches as needed for pain. Also try some heat application and increase movement and activity of the left shoulder as tolerated.  If symptoms continue follow-up with your orthopedic provider.

## 2021-05-10 NOTE — ED Provider Notes (Signed)
[facility]    CSN: [id number] Arrival date & time: 05/10/21  1429      History   Chief Complaint Chief Complaint  Patient presents with   Shoulder Pain    HPI Justin [name] is a 65 y.o. male.   HPI Patient here today with shoulder pain after cutting some shrubbery 2 days ago.  He is having some limited range of motion due to pain at the joint.  Patient recently discharged from the hospital for cerebral infarct.  He reports he has not fallen or injured himself since discharge.  He subsequently noticed the pain a few hours after cutting shrubs.  Has been taken Tylenol without resolution of pain. Past Medical History:  Diagnosis Date   Anxiety    Diarrhea    Hypertension    OSA (obstructive sleep apnea)    cpap 9 cm H2O   Stroke [facility])     Patient Active Problem List   Diagnosis Date Noted   Acute metabolic encephalopathy 85/12/7739   Encephalitis 04/30/2021   BMI 27.0-27.9,adult 04/30/2021   Acid reflux 04/27/2021   Ulcerative colitis ([place]) 09/24/2019   Incarcerated right inguinal hernia 01/30/2019   SBO (small bowel obstruction) (HCC)    Left sided lacunar stroke ([place]) 05/24/2018   Cerebral embolism with cerebral infarction 04/15/2018   Right facial numbness 04/13/2018   Recurrent unilateral inguinal hernia with obstruction and without gangrene    OSA (obstructive sleep apnea) 03/13/2018   Hypersomnia with sleep apnea 12/15/2017   Diarrhea 05/08/2017   Chest pain 05/19/2015   Anxiety 05/19/2015   Hyperglycemia 05/19/2015   Hypertension     Past Surgical History:  Procedure Laterality Date   BIOPSY  06/21/2017   Procedure: BIOPSY;  Surgeon: Rogene Houston, MD;  Location: [facility];  Service: Endoscopy;;  rectal, righta and left colon;   COLONOSCOPY N/A 06/21/2017   Procedure: COLONOSCOPY;  Surgeon: Rogene Houston, MD;  Location: [facility];  Service: Endoscopy;  Laterality: N/A;  1:25   INGUINAL HERNIA REPAIR Right 04/04/2018   Procedure:  HERNIA REPAIR INGUINAL ADULT WITH MESH;  Surgeon: Aviva Signs, MD;  Location: [facility] ORS;  Service: General;  Laterality: Right;   INGUINAL HERNIA REPAIR Right 01/31/2019   Procedure: RECURRENT RIGHT INGUINAL HERNIORRHAPY WITH MESH, INCARCERATED;  Surgeon: Aviva Signs, MD;  Location: [facility] ORS;  Service: General;  Laterality: Right;   KNEE SURGERY     tore mcl in high school (70's)       Home Medications    Prior to Admission medications   Medication Sig Start Date End Date Taking? Authorizing Provider  Ascorbic Acid (VITAMIN C) 100 MG tablet Take 100 mg by mouth daily. OTC    [provider]  aspirin EC 81 MG tablet Take 1 tablet (81 mg total) by mouth daily. Swallow whole. 05/05/21 05/05/22  Elgergawy, Silver Huguenin, MD  atorvastatin (LIPITOR) 40 MG tablet TAKE ONE TABLET BY MOUTH AT 6PM 06/02/20   [name], MD  diphenhydrAMINE (BENADRYL) 25 MG tablet Take 25 mg by mouth at bedtime as needed.     [provider]  escitalopram (LEXAPRO) 20 MG tablet Take 1 tablet (20 mg total) by mouth at bedtime. 05/18/20   Frann Rider, NP  hydrochlorothiazide (HYDRODIURIL) 25 MG tablet Take 25 mg by mouth daily.    [provider]  levETIRAcetam (KEPPRA) 500 MG tablet Take 1 tablet (500 mg total) by mouth 2 (two) times daily. 05/05/21   Elgergawy, Emeline Gins  S, MD  Mesalamine (DELZICOL) 400 MG CPDR DR capsule Take 2 capsules (800 mg total) by mouth 2 (two) times daily. 09/29/20   Ezzard Standing, PA-C  pantoprazole (PROTONIX) 40 MG tablet Take 1 tablet (40 mg total) by mouth daily. 05/06/21   Elgergawy, Silver Huguenin, MD  vitamin B-12 (CYANOCOBALAMIN) 500 MCG tablet Take 1 tablet (500 mcg total) by mouth daily. 05/05/21   Elgergawy, Silver Huguenin, MD    Family History Family History  Problem Relation Age of Onset   Tuberculosis Mother    Stroke Mother    Heart disease Father        died at 68   Bradycardia Brother        pacemaker   Stroke Brother    Cancer Paternal Uncle    Mental  illness Paternal Grandfather        suicide at 41   Colon cancer Neg Hx     Social History Social History   Tobacco Use   Smoking status: Never   Smokeless tobacco: Never  Vaping Use   Vaping Use: Never used  Substance Use Topics   Alcohol use: No    Alcohol/week: 0.0 standard drinks   Drug use: No     Allergies   Patient has no known allergies.   Review of Systems Review of Systems Pertinent negatives listed in HPI  Physical Exam Triage Vital Signs ED Triage Vitals  Enc Vitals Group     BP 05/10/21 1441 108/71     Pulse Rate 05/10/21 1441 (!) 58     Resp 05/10/21 1441 18     Temp 05/10/21 1441 98 F (36.7 C)     Temp Source 05/10/21 1441 Oral     SpO2 05/10/21 1441 97 %     Weight --      Height --      Head Circumference --      Peak Flow --      Pain Score 05/10/21 1440 6     Pain Loc --      Pain Edu? --      Excl. in [place]? --    No data found.  Updated Vital Signs BP 108/71 (BP Location: Right Arm)   Pulse (!) 58   Temp 98 F (36.7 C) (Oral)   Resp 18   SpO2 97%   Visual Acuity Right Eye Distance:   Left Eye Distance:   Bilateral Distance:    Right Eye Near:   Left Eye Near:    Bilateral Near:     Physical Exam   UC Treatments / Results  Labs (all labs ordered are listed, but only abnormal results are displayed) Labs Reviewed - No data to display  EKG   Radiology No results found.  Procedures Procedures (including critical care time)  Medications Ordered in UC Medications - No data to display  Initial Impression / Assessment and Plan / UC Course  I have reviewed the triage vital signs and the nursing notes.  Pertinent labs & imaging results that were available during my care of the patient were reviewed by me and considered in my medical decision making (see chart for details).    Patient presents today with acute shoulder pain with no known injury.  Given patient's medical history and recent CVA patient is unable to  tolerate any type of NSAIDs. We will treat today with a steroid oral injection with dexamethasone.  Prescribed lidocaine patches for acute pain advised to continue Tylenol as  needed for pain.  Follow-up with orthopedics if symptoms worsen or do not improve. Left extremity placed in a shoulder immobilizer advised to continue wearing while at rest and perform routine range of motion exercises as tolerated. Final Clinical Impressions(s) / UC Diagnoses   Final diagnoses:  Acute pain of left shoulder     Discharge Instructions      Continue Tylenol along with lidocaine patches as needed for pain. Also try some heat application and increase movement and activity of the left shoulder as tolerated.  If symptoms continue follow-up with your orthopedic provider.    ED Prescriptions     Medication Sig Dispense Auth. Provider   Lido-Capsaicin-Men-Methyl Sal 0.5-0.035-5-20 % [facility] Apply 1 patch topically daily as needed (shoulder neck pr). 79 patch Scot Jun, FNP      PDMP not reviewed this encounter.   Scot Jun, FNP 05/15/21 [phone]

## 2021-05-18 ENCOUNTER — Ambulatory Visit (INDEPENDENT_AMBULATORY_CARE_PROVIDER_SITE_OTHER): Payer: Medicare Other | Admitting: Family Medicine

## 2021-05-18 ENCOUNTER — Encounter: Payer: Self-pay | Admitting: Family Medicine

## 2021-05-18 ENCOUNTER — Other Ambulatory Visit: Payer: Self-pay

## 2021-05-18 VITALS — BP 136/84 | HR 63 | Temp 98.5°F | Ht 73.0 in | Wt 201.4 lb

## 2021-05-18 DIAGNOSIS — G0490 Encephalitis and encephalomyelitis, unspecified: Secondary | ICD-10-CM | POA: Diagnosis not present

## 2021-05-18 DIAGNOSIS — I6930 Unspecified sequelae of cerebral infarction: Secondary | ICD-10-CM

## 2021-05-18 DIAGNOSIS — R413 Other amnesia: Secondary | ICD-10-CM

## 2021-05-18 DIAGNOSIS — I6381 Other cerebral infarction due to occlusion or stenosis of small artery: Secondary | ICD-10-CM

## 2021-05-18 NOTE — Progress Notes (Signed)
Subjective:    Patient ID: Justin [name], male    DOB: February 04, 1956, 65 y.o.   MRN: [id number]  HPI Admit date: 04/30/2021 Discharge date: 05/05/2021   Admitted From: Home Disposition: Home   Recommendations for Outpatient Follow-up:  -Continue Keppra on discharge. -Have patient f/up with [facility] 4 weeks after discharge. -Recommend formal neurological cognitive testing (this has already been mentioned in out patient neurology notes). -F/U autoimmune encephalitis panel as outpatient. -Abeytas seizure precautions. Home safety seizure precautions.      Home Health:NO Equipment/Devices:No   Discharge Condition:Stable CODE STATUS:FULL Diet recommendation: Heart Healthy   Brief/Interim Summary:   Acute metabolic encephalopathy -Work-up diagnosis on discharge is autoimmune encephalitis versus less likely seizures (Patient and wife at bedside were counseled at length about seizures restrictions on discharge) -Patient presented with confusion more so than he has been.  Per wife, he is supposed to follow-up with neurology for mental status evaluation in August. -MRI brain showed significant abnormal signal in the left temporal lobe, differential includes subacute infarction, encephalitis versus seizure -MRI brain with contrast showed no evidence normal or pathological enhancement seen within the brain -Currently patient is alert and oriented x3 -Patient with no neurological focal deficits at this time -IR consulted for LP (LP attempted in the ED was unsuccessful)-not consistent with bacterial infection, therefore ampicillin, ceftriaxone and vancomycin have been discontinued -HSV was negative, VDRL nonreactive, so acyclovir was stopped -EEG showed mild diffuse slowing indicating global cerebral dysfunction.  Focal slowing indicating superimposed focal dysfunction in the lef frontotemporal region. -Pending autoimmune workup -Neurology consulted and appreciated, patient is treated with  IV Solu-Medrol 1000 mg oral daily, to finish total of 30 days, tomorrow he will receive it in the infusion center, gratefully it has been arranged by neurology -B12 on the lower side, he will be discharged on supplements   Essential hypertension -Antihypertensives currently held (metoprolol, HCTZ and losartan) during hospital stay given soft blood pressure, it is started to increase at time of discharge, so losartan has been resumed, patient heart rate on the lower side, so metoprolol need to be held. -Norvasc was recently discontinued by his PCP   Obstructive sleep apnea -Continue CPAP   History of CVA -Continue with aspirin   History of ulcerative colitis -Continue mesalamine   Hypokalemia -replaced   Discharge Diagnoses:  Active Problems:   OSA (obstructive sleep apnea)   Cerebral embolism with cerebral infarction   Acute metabolic encephalopathy   Encephalitis   Acid reflux    I reviewed the patient's discharge summary copied above.  Patient was admitted to the hospital June 24.  His wife came home and found the patient confused.  He was speaking incoherently and making no sense.  This was a sudden change for the patient.  He was taken immediately to the hospital due to concern about a possible stroke.  MRI showed abnormal signal in the left temporal lobe particularly in the hyper campus.  Differential diagnosis included subacute infarction, post seizure, encephalitis.  Patient underwent a lumbar puncture that ruled out herpes encephalitis.  CSF culture was negative.  He also underwent an extensive autoimmune panel to rule out autoimmune encephalitis and received immunosuppressive therapy. Admission on 04/30/2021, Discharged on 05/05/2021  Component Date Value Ref Range Status   Sodium 04/30/2021 134 (A) 135 - 145 mmol/L Final   Potassium 04/30/2021 3.7  3.5 - 5.1 mmol/L Final   Chloride 04/30/2021 103  98 - 111 mmol/L Final   CO2 04/30/2021 25  22 - 32 mmol/L Final   Glucose,  Bld 04/30/2021 128 (A) 70 - 99 mg/dL Final   Glucose reference range applies only to samples taken after fasting for at least 8 hours.   BUN 04/30/2021 18  8 - 23 mg/dL Final   Creatinine, Ser 04/30/2021 0.98  0.61 - 1.24 mg/dL Final   Calcium 04/30/2021 9.2  8.9 - 10.3 mg/dL Final   Total Protein 04/30/2021 7.3  6.5 - 8.1 g/dL Final   Albumin 04/30/2021 4.2  3.5 - 5.0 g/dL Final   AST 04/30/2021 30  15 - 41 U/L Final   ALT 04/30/2021 32  0 - 44 U/L Final   Alkaline Phosphatase 04/30/2021 92  38 - 126 U/L Final   Total Bilirubin 04/30/2021 1.2  0.3 - 1.2 mg/dL Final   GFR, Estimated 04/30/2021 >60  >60 mL/min Final   Comment: (NOTE) Calculated using the CKD-EPI Creatinine Equation (2021)    Anion gap 04/30/2021 6  5 - 15 Final   Performed at [facility], [address], [place], [place] [address]   WBC 04/30/2021 11.9 (A) 4.0 - 10.5 K/uL Final   RBC 04/30/2021 4.84  4.22 - 5.81 MIL/uL Final   Hemoglobin 04/30/2021 15.9  13.0 - 17.0 g/dL Final   HCT 04/30/2021 46.9  39.0 - 52.0 % Final   MCV 04/30/2021 96.9  80.0 - 100.0 fL Final   MCH 04/30/2021 32.9  26.0 - 34.0 pg Final   MCHC 04/30/2021 33.9  30.0 - 36.0 g/dL Final   RDW 04/30/2021 12.6  11.5 - 15.5 % Final   Platelets 04/30/2021 304  150 - 400 K/uL Final   nRBC 04/30/2021 0.0  0.0 - 0.2 % Final   Neutrophils Relative % 04/30/2021 87  % Final   Neutro Abs 04/30/2021 10.3 (A) 1.7 - 7.7 K/uL Final   Lymphocytes Relative 04/30/2021 7  % Final   Lymphs Abs 04/30/2021 0.9  0.7 - 4.0 K/uL Final   Monocytes Relative 04/30/2021 4  % Final   Monocytes Absolute 04/30/2021 0.5  0.1 - 1.0 K/uL Final   Eosinophils Relative 04/30/2021 1  % Final   Eosinophils Absolute 04/30/2021 0.1  0.0 - 0.5 K/uL Final   Basophils Relative 04/30/2021 0  % Final   Basophils Absolute 04/30/2021 0.0  0.0 - 0.1 K/uL Final   Immature Granulocytes 04/30/2021 1  % Final   Abs Immature Granulocytes 04/30/2021 0.07  0.00 - 0.07 K/uL Final   Performed at [facility], [address] [place], [place] [address]   Color, Urine 04/30/2021 YELLOW  YELLOW Final   APPearance 04/30/2021 CLEAR  CLEAR Final   Specific Gravity, Urine 04/30/2021 1.015  1.005 - 1.030 Final   pH 04/30/2021 6.0  5.0 - 8.0 Final   Glucose, UA 04/30/2021 NEGATIVE  NEGATIVE mg/dL Final   Hgb urine dipstick 04/30/2021 NEGATIVE  NEGATIVE Final   Bilirubin Urine 04/30/2021 NEGATIVE  NEGATIVE Final   Ketones, ur 04/30/2021 NEGATIVE  NEGATIVE mg/dL Final   Protein, ur 04/30/2021 NEGATIVE  NEGATIVE mg/dL Final   Nitrite 04/30/2021 NEGATIVE  NEGATIVE Final   Leukocytes,Ua 04/30/2021 NEGATIVE  NEGATIVE Final   Performed at [facility], [address] [place], [place] [address]   SARS Coronavirus 2 by RT PCR 04/30/2021 NEGATIVE  NEGATIVE Final   Comment: (NOTE) SARS-CoV-2 target nucleic acids are NOT DETECTED.  The SARS-CoV-2 RNA is generally detectable in upper respiratory specimens during the acute phase of infection. The lowest concentration of SARS-CoV-2 viral  copies this assay can detect is 138 copies/mL. A negative result does not preclude SARS-Cov-2 infection and should not be used as the sole basis for treatment or other patient management decisions. A negative result may occur with  improper specimen collection/handling, submission of specimen other than nasopharyngeal swab, presence of viral mutation(s) within the areas targeted by this assay, and inadequate number of viral copies(<138 copies/mL). A negative result must be combined with clinical observations, patient history, and epidemiological information. The expected result is Negative.  Fact Sheet for Patients:  EntrepreneurPulse.com.au  Fact Sheet for Healthcare Providers:  IncredibleEmployment.be  This test is no                          t yet approved or cleared by the [place] FDA and  has been authorized for detection and/or diagnosis of SARS-CoV-2 by FDA under an  Emergency Use Authorization (EUA). This EUA will remain  in effect (meaning this test can be used) for the duration of the COVID-19 declaration under Section 564(b)(1) of the Act, 21 U.S.C.section 360bbb-3(b)(1), unless the authorization is terminated  or revoked sooner.       Influenza A by PCR 04/30/2021 NEGATIVE  NEGATIVE Final   Influenza B by PCR 04/30/2021 NEGATIVE  NEGATIVE Final   Comment: (NOTE) The Xpert Xpress SARS-CoV-2/FLU/RSV plus assay is intended as an aid in the diagnosis of influenza from Nasopharyngeal swab specimens and should not be used as a sole basis for treatment. Nasal washings and aspirates are unacceptable for Xpert Xpress SARS-CoV-2/FLU/RSV testing.  Fact Sheet for Patients: EntrepreneurPulse.com.au  Fact Sheet for Healthcare Providers: IncredibleEmployment.be  This test is not yet approved or cleared by the [place] FDA and has been authorized for detection and/or diagnosis of SARS-CoV-2 by FDA under an Emergency Use Authorization (EUA). This EUA will remain in effect (meaning this test can be used) for the duration of the COVID-19 declaration under Section 564(b)(1) of the Act, 21 U.S.C. section 360bbb-3(b)(1), unless the authorization is terminated or revoked.  Performed at [facility], [address], [place], [place] [address]    Alcohol, Ethyl (B) 04/30/2021 <10  <10 mg/dL Final   Comment: (NOTE) Lowest detectable limit for serum alcohol is 10 mg/dL.  For medical purposes only. Performed at [facility], [address], [place], [place] [address]    Prothrombin Time 04/30/2021 14.6  11.4 - 15.2 seconds Final   INR 04/30/2021 1.1  0.8 - 1.2 Final   Comment: (NOTE) INR goal varies based on device and disease states. Performed at [facility], [address], [place], [place] [address]    aPTT 04/30/2021 28  24 - 36 seconds Final   Performed at [facility], [address], [place], [place] [address]    Opiates 04/30/2021 NONE DETECTED  NONE DETECTED Final   Cocaine 04/30/2021 NONE DETECTED  NONE DETECTED Final   Benzodiazepines 04/30/2021 NONE DETECTED  NONE DETECTED Final   Amphetamines 04/30/2021 NONE DETECTED  NONE DETECTED Final   Tetrahydrocannabinol 04/30/2021 NONE DETECTED  NONE DETECTED Final   Barbiturates 04/30/2021 NONE DETECTED  NONE DETECTED Final   Comment: (NOTE) DRUG SCREEN FOR MEDICAL PURPOSES ONLY.  IF CONFIRMATION IS NEEDED FOR ANY PURPOSE, NOTIFY LAB WITHIN 5 DAYS.  LOWEST DETECTABLE LIMITS FOR URINE DRUG SCREEN Drug Class                     Cutoff (ng/mL) Amphetamine and metabolites    1000  Barbiturate and metabolites    200 Benzodiazepine                 101 Tricyclics and metabolites     300 Opiates and metabolites        300 Cocaine and metabolites        300 THC                            50 Performed at [facility], [address] [place], [place] [address]    Troponin I (High Sensitivity) 04/30/2021 4  <18 ng/L Final   Comment: (NOTE) Elevated high sensitivity troponin I (hsTnI) values and significant  changes across serial measurements may suggest ACS but many other  chronic and acute conditions are known to elevate hsTnI results.  Refer to the "Links" section for chest pain algorithms and additional  guidance. Performed at [facility], [address], [place], [place] [address]    Troponin I (High Sensitivity) 04/30/2021 4  <18 ng/L Final   Comment: (NOTE) Elevated high sensitivity troponin I (hsTnI) values and significant  changes across serial measurements may suggest ACS but many other  chronic and acute conditions are known to elevate hsTnI results.  Refer to the "Links" section for chest pain algorithms and additional  guidance. Performed at [facility], [address] [place], [place] [address]    Tube # 05/01/2021 1   Final   Color, CSF 05/01/2021 COLORLESS  COLORLESS Final   Appearance, CSF 05/01/2021 CLEAR  CLEAR Final    Supernatant 05/01/2021 NOT INDICATED   Final   RBC Count, CSF 05/01/2021 0  0 /cu mm Final   WBC, CSF 05/01/2021 3  0 - 5 /cu mm Final   Other Cells, CSF 05/01/2021 TOO FEW TO COUNT, SMEAR AVAILABLE FOR REVIEW   Final   Comment: Rare lymphocyte, rare mono/macrophage Performed at [place] [address], [place], [place] [address]    Glucose, CSF 05/01/2021 76 (A) 40 - 70 mg/dL Final   Total  Protein, CSF 05/01/2021 30  15 - 45 mg/dL Final   Performed at [place] [address], [place], [place] [address]   Specimen Description 04/30/2021 CSF   Final   Special Requests 04/30/2021 Normal   Final   Gram Stain 04/30/2021    Final                   Value:WBC PRESENT,BOTH PMN AND MONONUCLEAR NO ORGANISMS SEEN CYTOSPIN SMEAR    Culture 04/30/2021    Final                   Value:NO GROWTH 3 DAYS Performed at [facility], [place] [address], [place], [place] [address]    Report Status 04/30/2021 05/04/2021 FINAL   Final   HSV-1 DNA 05/01/2021 Negative  Negative Final   HSV-2 DNA 05/01/2021 Negative  Negative Final   Comment: (NOTE) Performed At: [facility] [place], [place] [id number] Rush Farmer MD IZ:[id number]    Specimen Description 04/30/2021 BLOOD RIGHT ARM   Final   Special Requests 04/30/2021 Blood Culture adequate volume BOTTLES DRAWN AEROBIC AND ANAEROBIC   Final   Culture 04/30/2021    Final                   Value:NO GROWTH 6 DAYS Performed at [facility], [address], [place], [place] [address]  Report Status 04/30/2021 05/06/2021 FINAL   Final   Specimen Description 04/30/2021 LEFT ANTECUBITAL   Final   Special Requests 04/30/2021 Blood Culture results may not be optimal due to an excessive volume of blood received in culture bottles BOTTLES DRAWN AEROBIC AND ANAEROBIC   Final   Culture 04/30/2021    Final                   Value:NO GROWTH 6 DAYS Performed at [facility], [address] [place], [place]  [address]    Report Status 04/30/2021 05/06/2021 FINAL   Final   VDRL Quant, CSF 05/01/2021 Non Reactive  Non Rea:<1:1 Final   Comment: (NOTE) Performed At: [facility] [place], [place] [id number] Rush Farmer MD DS:[id number]    WBC 05/01/2021 13.2 (A) 4.0 - 10.5 K/uL Final   RBC 05/01/2021 4.31  4.22 - 5.81 MIL/uL Final   Hemoglobin 05/01/2021 13.9  13.0 - 17.0 g/dL Final   HCT 05/01/2021 41.6  39.0 - 52.0 % Final   MCV 05/01/2021 96.5  80.0 - 100.0 fL Final   MCH 05/01/2021 32.3  26.0 - 34.0 pg Final   MCHC 05/01/2021 33.4  30.0 - 36.0 g/dL Final   RDW 05/01/2021 12.5  11.5 - 15.5 % Final   Platelets 05/01/2021 290  150 - 400 K/uL Final   nRBC 05/01/2021 0.0  0.0 - 0.2 % Final   Performed at [facility], [place] [address] [place], [place] [address]   Sodium 05/01/2021 136  135 - 145 mmol/L Final   Potassium 05/01/2021 4.1  3.5 - 5.1 mmol/L Final   Chloride 05/01/2021 104  98 - 111 mmol/L Final   CO2 05/01/2021 24  22 - 32 mmol/L Final   Glucose, Bld 05/01/2021 102 (A) 70 - 99 mg/dL Final   Glucose reference range applies only to samples taken after fasting for at least 8 hours.   BUN 05/01/2021 15  8 - 23 mg/dL Final   Creatinine, Ser 05/01/2021 0.97  0.61 - 1.24 mg/dL Final   Calcium 05/01/2021 8.4 (A) 8.9 - 10.3 mg/dL Final   Total Protein 05/01/2021 5.7 (A) 6.5 - 8.1 g/dL Final   Albumin 05/01/2021 3.2 (A) 3.5 - 5.0 g/dL Final   AST 05/01/2021 20  15 - 41 U/L Final   ALT 05/01/2021 20  0 - 44 U/L Final   Alkaline Phosphatase 05/01/2021 64  38 - 126 U/L Final   Total Bilirubin 05/01/2021 1.2  0.3 - 1.2 mg/dL Final   GFR, Estimated 05/01/2021 >60  >60 mL/min Final   Comment: (NOTE) Calculated using the CKD-EPI Creatinine Equation (2021)    Anion gap 05/01/2021 8  5 - 15 Final   Performed at [facility], [place] [address], [place], [place] [address]   HIV Screen 4th Generation wRfx 05/01/2021 Non Reactive  Non Reactive Final   Performed at  [facility], [place] [address] [place], [place] [address]   [place] test code 05/02/2021 364680   Corrected   [facility] test name 05/02/2021 AUTOIMMUNE ENCEPHALITIS AB PANEL   Corrected   Source ([facility]) 05/02/2021 SERUM   Corrected   Performed at [facility], [place] [address] [place],  [address]   Misc [facility] result 05/02/2021 COMMENT   Final   Comment: (NOTE) Test Ordered: 482500 Autoimmune Encephalitis Ab NMDA Cell-based IFA            Negative  BN     Reference Range: Negative                              This test was developed and its performance characteristics determined by [facility]. It has not been cleared or approved by the Food and Drug Administration. AMPAR1/R2 Cell-based IFA       Negative                  BN     Reference Range: Negative                              This test was developed and its performance characteristics determined by [facility]. It has not been cleared or approved by the Food and Drug Administration. GABA-B Receptor Cell-based IFA Negative                  BN     Reference Range: Negative                              This test was developed and its performance characteristics determined by [facility]. It has not been cleared or approved by the Food and Drug Administration. LGI-1 Cell-based IFA           Negative                  BN     Reference Range: Negative                                                        This test was developed and its performance characteristics determined by [facility]. It has not been cleared or approved by the Food and Drug Administration. CASPR2 Cell-based IFA          Negative                  BN     Reference Range: Negative                              This test was developed and its performance characteristics determined by [facility]. It has not been cleared or approved by the Food and Drug Administration. DPPX Cell-based IFA            Negative                  BN     Reference Range:  Negative                              This test was developed and its performance characteristics determined by [facility]. It has not been cleared or approved by the Food and Drug Administration. Performed At: [facility] [place], [place] [id number] Rush Farmer MD JK:[id number]    Ammonia 05/02/2021 19  9 - 35 umol/L Final   Performed at [facility], [place] [address], [place], [place] [address]   Folate 05/02/2021 9.7  >5.9 ng/mL Final   Performed at [place] [address] [place], [place]  [address]   TSH 05/02/2021 1.334  0.350 - 4.500 uIU/mL Final   Comment: Performed by a 3rd Generation assay with a functional sensitivity of <=0.01 uIU/mL. Performed at [facility], [place] [address], [place], [place] [address]    Vitamin B-12 05/02/2021 279  180 - 914 pg/mL Final   Comment: (NOTE) This assay is not validated for testing neonatal or myeloproliferative syndrome specimens for Vitamin B12 levels. Performed at [facility], [place] [address], [place], [place] [address]    Vitamin B1 (Thiamine) 05/02/2021 112.1  66.5 - 200.0 nmol/L Final   Comment: (NOTE) This test was developed and its performance characteristics determined by [facility]. It has not been cleared or approved by the Food and Drug Administration. Performed At: [facility] [place], [place] [id number] Rush Farmer MD WY:[id number]    Methylmalonic Acid, Quantitative 05/02/2021 335  0 - 378 nmol/L Final   Comment: (NOTE) This test was developed and its performance characteristics determined by [facility]. It has not been cleared or approved by the Food and Drug Administration. Performed At: [facility] [address] [place], [place] [id number] Rush Farmer MD VE:[id number]    Ribonucleic Protein 05/03/2021 <0.2  0.0 - 0.9 AI Corrected   SSA (Ro) (ENA) Antibody, IgG 05/03/2021 0.2  0.0 - 0.9 AI Final   Scleroderma (Scl-70) (ENA)  Antibod* 05/03/2021 <0.2  0.0 - 0.9 AI Final   ENA SM Ab Ser-aCnc 05/03/2021 <0.2  0.0 - 0.9 AI Final   SSB (La) (ENA) Antibody, IgG 05/03/2021 <0.2  0.0 - 0.9 AI Final   ds DNA Ab 05/03/2021 <1  0 - 9 IU/mL Final   Comment: (NOTE)                                   Negative      <5                                   Equivocal  5 - 9                                   Positive      >9 Performed At: [facility] [place], [place] [id number] Rush Farmer MD LY:[id number]    Anti JO-1 05/03/2021 <0.2  0.0 - 0.9 AI Final   Comment: (NOTE) Performed At: [facility] [place], [place] [id number] Rush Farmer MD VC:[id number]    Rhuematoid fact SerPl-aCnc 05/03/2021 <10.0  <14.0 IU/mL Final   Comment: (NOTE) Performed At: [facility] [place], [place] [id number] Rush Farmer MD TT:[id number]    Thyroglobulin Antibody 05/03/2021 <1.0  0.0 - 0.9 IU/mL Final   Comment: (NOTE) Thyroglobulin Antibody measured by [facility] Methodology Performed At: [facility] [place], [place] [id number] Rush Farmer MD AU:[id number]    Sodium 05/04/2021 136  135 - 145 mmol/L Final   Potassium 05/04/2021 3.2 (A) 3.5 - 5.1 mmol/L Final   Chloride 05/04/2021 106  98 - 111 mmol/L Final   CO2 05/04/2021 24  22 - 32 mmol/L Final   Glucose, Bld 05/04/2021 86  70 - 99 mg/dL Final   Glucose reference range applies only to samples taken after fasting for at least  8 hours.   BUN 05/04/2021 12  8 - 23 mg/dL Final   Creatinine, Ser 05/04/2021 0.93  0.61 - 1.24 mg/dL Final   Calcium 05/04/2021 8.6 (A) 8.9 - 10.3 mg/dL Final   GFR, Estimated 05/04/2021 >60  >60 mL/min Final   Comment: (NOTE) Calculated using the CKD-EPI Creatinine Equation (2021)    Anion gap 05/04/2021 6  5 - 15 Final   Performed at [facility], [place] [address] [place], [place] [address]   Magnesium 05/05/2021 2.0  1.7 - 2.4 mg/dL Final    Performed at [place] [address], [place], [place] [address]   Sodium 05/05/2021 136  135 - 145 mmol/L Final   Potassium 05/05/2021 3.6  3.5 - 5.1 mmol/L Final   Chloride 05/05/2021 105  98 - 111 mmol/L Final   CO2 05/05/2021 24  22 - 32 mmol/L Final   Glucose, Bld 05/05/2021 157 (A) 70 - 99 mg/dL Final   Glucose reference range applies only to samples taken after fasting for at least 8 hours.   BUN 05/05/2021 12  8 - 23 mg/dL Final   Creatinine, Ser 05/05/2021 0.95  0.61 - 1.24 mg/dL Final   Calcium 05/05/2021 8.9  8.9 - 10.3 mg/dL Final   GFR, Estimated 05/05/2021 >60  >60 mL/min Final   Comment: (NOTE) Calculated using the CKD-EPI Creatinine Equation (2021)    Anion gap 05/05/2021 7  5 - 15 Final   Performed at [place] [address], [place], [place] [address]   WBC 05/05/2021 7.2  4.0 - 10.5 K/uL Final   RBC 05/05/2021 4.38  4.22 - 5.81 MIL/uL Final   Hemoglobin 05/05/2021 14.1  13.0 - 17.0 g/dL Final   HCT 05/05/2021 40.9  39.0 - 52.0 % Final   MCV 05/05/2021 93.4  80.0 - 100.0 fL Final   MCH 05/05/2021 32.2  26.0 - 34.0 pg Final   MCHC 05/05/2021 34.5  30.0 - 36.0 g/dL Final   RDW 05/05/2021 12.5  11.5 - 15.5 % Final   Platelets 05/05/2021 283  150 - 400 K/uL Final   nRBC 05/05/2021 0.0  0.0 - 0.2 % Final   Performed at [place] [address], [place], [place] [address]   Autoimmune panel was negative.  EEG showed mild diffuse slowing indicating global cerebral dysfunction.  Patient is here today with his wife for follow-up.  He seems back to his baseline.  He defers to his wife for the majority of the conversation and questioning.  Patient has seemingly progressive short-term memory loss and confusion.  I believe this is indicative of underlying dementia.  However his speech is back to his baseline.  Wife denies any further seizures.  He denies any fevers or chills or neck stiffness.  He denies any other acute neurologic deficit. Past  Medical History:  Diagnosis Date   Anxiety    Diarrhea    Hypertension    OSA (obstructive sleep apnea)    cpap 9 cm H2O   Stroke [facility])    Past Surgical History:  Procedure Laterality Date   BIOPSY  06/21/2017   Procedure: BIOPSY;  Surgeon: Rogene Houston, MD;  Location: [facility] END[facility];  Service: Endoscopy;;  rectal, righta and left colon;   COLONOSCOPY N/A 06/21/2017   Procedure: COLONOSCOPY;  Surgeon: Rogene Houston, MD;  Location: [facility] END[facility];  Service: Endoscopy;  Laterality: N/A;  1:25   INGUINAL HERNIA REPAIR Right 04/04/2018  Procedure: HERNIA REPAIR INGUINAL ADULT WITH MESH;  Surgeon: Aviva Signs, MD;  Location: [facility] ORS;  Service: General;  Laterality: Right;   INGUINAL HERNIA REPAIR Right 01/31/2019   Procedure: RECURRENT RIGHT INGUINAL HERNIORRHAPY WITH MESH, INCARCERATED;  Surgeon: Aviva Signs, MD;  Location: [facility];  Service: General;  Laterality: Right;   KNEE SURGERY     tore mcl in high school (70's)   Current Outpatient Medications on File Prior to Visit  Medication Sig Dispense Refill   Ascorbic Acid (VITAMIN C) 100 MG tablet Take 100 mg by mouth daily. OTC     aspirin EC 81 MG tablet Take 1 tablet (81 mg total) by mouth daily. Swallow whole. 150 tablet 2   atorvastatin (LIPITOR) 40 MG tablet TAKE ONE TABLET BY MOUTH AT 6PM 90 tablet 3   diphenhydrAMINE (BENADRYL) 25 MG tablet Take 25 mg by mouth at bedtime as needed.      escitalopram (LEXAPRO) 20 MG tablet Take 1 tablet (20 mg total) by mouth at bedtime. 90 tablet 3   hydrochlorothiazide (HYDRODIURIL) 25 MG tablet Take 25 mg by mouth daily.     levETIRAcetam (KEPPRA) 500 MG tablet Take 1 tablet (500 mg total) by mouth 2 (two) times daily. 60 tablet 1   Lido-Capsaicin-Men-Methyl Sal 0.5-0.035-5-20 % PTCH Apply 1 patch topically daily as needed (shoulder neck pr). 30 patch 0   Mesalamine (DELZICOL) 400 MG CPDR DR capsule Take 2 capsules (800 mg total) by mouth 2 (two) times daily. 360 capsule 3   pantoprazole  (PROTONIX) 40 MG tablet Take 1 tablet (40 mg total) by mouth daily. 30 tablet 0   vitamin B-12 (CYANOCOBALAMIN) 500 MCG tablet Take 1 tablet (500 mcg total) by mouth daily. 30 tablet 1   No current facility-administered medications on file prior to visit.   No Known Allergies Social History   Socioeconomic History   Marital status: Married    Spouse name: [name]   Number of children: Not on file   Years of education: Not on file   Highest education level: Not on file  Occupational History   Not on file  Tobacco Use   Smoking status: Never   Smokeless tobacco: Never  Vaping Use   Vaping Use: Never used  Substance and Sexual Activity   Alcohol use: No    Alcohol/week: 0.0 standard drinks   Drug use: No   Sexual activity: Yes  Other Topics Concern   Not on file  Social History Narrative   Lives with wife   Social Determinants of Health   Financial Resource Strain: Not on file  Food Insecurity: Not on file  Transportation Needs: Not on file  Physical Activity: Not on file  Stress: Not on file  Social Connections: Not on file  Intimate Partner Violence: Not on file    Review of Systems     Objective:   Physical Exam Vitals reviewed.  Constitutional:      Appearance: Normal appearance. He is normal weight.  Cardiovascular:     Rate and Rhythm: Normal rate and regular rhythm.     Heart sounds: Normal heart sounds. No murmur heard.   No friction rub. No gallop.  Pulmonary:     Effort: Pulmonary effort is normal. No respiratory distress.     Breath sounds: Normal breath sounds. No stridor.  Abdominal:     General: Bowel sounds are normal. There is no distension.     Palpations: Abdomen is soft.     Tenderness: There is  no abdominal tenderness. There is no guarding.  Musculoskeletal:     Right lower leg: No edema.     Left lower leg: No edema.  Skin:    Findings: No erythema or rash.  Neurological:     General: No focal deficit present.     Mental Status: He  is alert and oriented to person, place, and time.     Cranial Nerves: No cranial nerve deficit.     Motor: No weakness.     Gait: Gait normal.        Assessment & Plan:  Memory loss  Cerebrovascular accident (CVA) due to occlusion of small artery (HCC)  Encephalitis Blood pressure at home has been averaging in the 120-130/60-80 range.  Therefore I do not feel that the patient requires any antihypertensives at the present time as he has not been taking hydrochlorothiazide or amlodipine.  Of asked his wife to continue to monitor his blood pressure and notify me if consistently greater than 140/90.  Patient has underlying dementia.  I believe that the majority of his issue.  The acute change on June 24 reflected either encephalitis, seizure activity, or a TIA.  I favor a TIA.  Patient has no history of seizures.  Wife witnessed no seizure activity.  I will continue Keppra at the present time as the patient has an appointment to see his neurologist tomorrow and I will defer to their judgment about continuing [place] however I feel that the patient can likely discontinue this.  Autoimmune panel is negative suggesting against autoimmune encephalitis.  Patient has completed high-dose steroids and is back to his baseline.  Infectious disease work-up has been negative to date.  Therefore I believe most of the symptoms were due to a vascular insult on top of previous dementia.  If neurology agrees I would recommend starting Aricept but I will defer to their judgment tomorrow.

## 2021-05-19 ENCOUNTER — Ambulatory Visit (INDEPENDENT_AMBULATORY_CARE_PROVIDER_SITE_OTHER): Payer: Medicare Other | Admitting: Neurology

## 2021-05-19 ENCOUNTER — Encounter: Payer: Self-pay | Admitting: Neurology

## 2021-05-19 VITALS — BP 147/85 | HR 53 | Ht 73.0 in | Wt 201.5 lb

## 2021-05-19 DIAGNOSIS — R9401 Abnormal electroencephalogram [EEG]: Secondary | ICD-10-CM | POA: Diagnosis not present

## 2021-05-19 DIAGNOSIS — R410 Disorientation, unspecified: Secondary | ICD-10-CM

## 2021-05-19 DIAGNOSIS — U071 COVID-19: Secondary | ICD-10-CM

## 2021-05-19 DIAGNOSIS — R4189 Other symptoms and signs involving cognitive functions and awareness: Secondary | ICD-10-CM | POA: Diagnosis not present

## 2021-05-19 DIAGNOSIS — G9349 Other encephalopathy: Secondary | ICD-10-CM | POA: Diagnosis not present

## 2021-05-19 DIAGNOSIS — F0150 Vascular dementia without behavioral disturbance: Secondary | ICD-10-CM | POA: Insufficient documentation

## 2021-05-19 DIAGNOSIS — R9089 Other abnormal findings on diagnostic imaging of central nervous system: Secondary | ICD-10-CM

## 2021-05-19 HISTORY — DX: Disorientation, unspecified: R41.0

## 2021-05-19 HISTORY — DX: Abnormal electroencephalogram (EEG): R94.01

## 2021-05-19 HISTORY — DX: Vascular dementia, unspecified severity, without behavioral disturbance, psychotic disturbance, mood disturbance, and anxiety: F01.50

## 2021-05-19 MED ORDER — LEVETIRACETAM 500 MG PO TABS: 500.0000 mg | ORAL_TABLET | Freq: Two times a day (BID) | ORAL | 5 refills | Status: DC

## 2021-05-19 MED ORDER — DONEPEZIL HCL 5 MG PO TABS: 5.0000 mg | ORAL_TABLET | Freq: Every day | ORAL | 3 refills | Status: DC

## 2021-05-19 NOTE — Patient Instructions (Addendum)
Donepezil tablets What is this medication? DONEPEZIL (doe NEP e zil) is used to treat mild to moderate dementia caused byAlzheimer's disease. This medicine may be used for other purposes; ask your health care provider orpharmacist if you have questions. COMMON BRAND NAME(S): Aricept What should I tell my care team before I take this medication? They need to know if you have any of these conditions: asthma or other lung disease difficulty passing urine head injury heart disease history of irregular heartbeat liver disease seizures (convulsions) stomach or intestinal disease, ulcers or stomach bleeding an unusual or allergic reaction to donepezil, other medicines, foods, dyes, or preservatives pregnant or trying to get pregnant breast-feeding How should I use this medication? Take this medicine by mouth with a glass of water. Follow the directions on the prescription label. You may take this medicine with or without food. Take this medicine at regular intervals. This medicine is usually taken before bedtime. Do not take it more often than directed. Continue to take your medicine even ifyou feel better. Do not stop taking except on your doctor's advice. If you are taking the 23 mg donepezil tablet, swallow it whole; do not cut,crush, or chew it. Talk to your pediatrician regarding the use of this medicine in children.Special care may be needed. Overdosage: If you think you have taken too much of this medicine contact apoison control center or emergency room at once. NOTE: This medicine is only for you. Do not share this medicine with others. What if I miss a dose? If you miss a dose, take it as soon as you can. If it is almost time for yournext dose, take only that dose, do not take double or extra doses. What may interact with this medication? Do not take this medicine with any of the following medications: certain medicines for fungal infections like itraconazole, fluconazole,  posaconazole, and voriconazole cisapride dextromethorphan; quinidine dronedarone pimozide quinidine thioridazine This medicine may also interact with the following medications: antihistamines for allergy, cough and cold atropine bethanechol carbamazepine certain medicines for bladder problems like oxybutynin, tolterodine certain medicines for Parkinson's disease like benztropine, trihexyphenidyl certain medicines for stomach problems like dicyclomine, hyoscyamine certain medicines for travel sickness like scopolamine dexamethasone dofetilide ipratropium NSAIDs, medicines for pain and inflammation, like ibuprofen or naproxen other medicines for Alzheimer's disease other medicines that prolong the QT interval (cause an abnormal heart rhythm) phenobarbital phenytoin rifampin, rifabutin or rifapentine ziprasidone This list may not describe all possible interactions. Give your health care provider a list of all the medicines, herbs, non-prescription drugs, or dietary supplements you use. Also tell them if you smoke, drink alcohol, or use illegaldrugs. Some items may interact with your medicine. What should I watch for while using this medication? Visit your doctor or health care professional for regular checks on your progress. Check with your doctor or health care professional if your symptomsdo not get better or if they get worse. You may get drowsy or dizzy. Do not drive, use machinery, or do anything thatneeds mental alertness until you know how this drug affects you. What side effects may I notice from receiving this medication? Side effects that you should report to your doctor or health care professionalas soon as possible: allergic reactions like skin rash, itching or hives, swelling of the face, lips, or tongue feeling faint or lightheaded, falls loss of bladder control seizures signs and symptoms of a dangerous change in heartbeat or heart rhythm like chest pain; dizziness;  fast or irregular heartbeat; palpitations; feeling faint  or lightheaded, falls; breathing problems signs and symptoms of infection like fever or chills; cough; sore throat; pain or trouble passing urine signs and symptoms of liver injury like dark yellow or brown urine; general ill feeling or flu-like symptoms; light-colored stools; loss of appetite; nausea; right upper belly pain; unusually weak or tired; yellowing of the eyes or skin slow heartbeat or palpitations unusual bleeding or bruising vomiting Side effects that usually do not require medical attention (report to yourdoctor or health care professional if they continue or are bothersome): diarrhea, especially when starting treatment headache loss of appetite muscle cramps nausea stomach upset This list may not describe all possible side effects. Call your doctor for medical advice about side effects. You may report side effects to FDA at1-800-FDA-1088. Where should I keep my medication? Keep out of reach of children. Store at room temperature between 15 and 30 degrees C (59 and 86 degrees F).Throw away any unused medicine after the expiration date. NOTE: This sheet is a summary. It may not cover all possible information. If you have questions about this medicine, talk to your doctor, pharmacist, orhealth care provider.  2022 Elsevier/Gold Standard (2018-10-15 10:33:41) Dementia Dementia is a condition that affects the way the brain functions. It often affects memory and thinking. Usually, dementia gets worse with time and cannot be reversed (progressive dementia). There are many types of dementia, including: Alzheimer's disease. This type is the most common. Vascular dementia. This type may happen as the result of a stroke. Lewy body dementia. This type may happen to people who have Parkinson's disease. Frontotemporal dementia. This type is caused by damage to nerve cells (neurons) in certain parts of the brain. Some people may be  affected by more than one type of dementia. This is calledmixed dementia. What are the causes? Dementia is caused by damage to cells in the brain. The area of the brain and the types of cells damaged determine the type of dementia. Usually, this damage is irreversible or cannot be undone. Some examples of irreversible causes include: Conditions that affect the blood vessels of the brain, such as diabetes, heart disease, or blood vessel disease. Genetic mutations. In some cases, changes in the brain may be caused by another condition and can be reversed or slowed. Some examples of reversible causes include: Injury to the brain. Certain medicines. Infection, such as meningitis. Metabolic problems, such as vitamin B12 deficiency or thyroid disease. Pressure on the brain, such as from a tumor, blood clot, or too much fluid in the brain (hydrocephalus). Autoimmune diseases that affect the brain or arteries, such as limbic encephalitis or vasculitis. What are the signs or symptoms? Symptoms of dementia depend on the type of dementia. Common signs of dementia include problems with remembering, thinking, problem solving, decision making, and communicating. These signs develop slowly or get worse with time. This may include: Problems remembering events or people. Having trouble taking a bath or putting clothes on. Forgetting appointments or forgetting to pay bills. Difficulty planning and preparing meals. Having trouble speaking. Getting lost easily. Changes in behavior or mood. How is this diagnosed? This condition is diagnosed by a specialist (neurologist). It is diagnosed based on the history of your symptoms, your medical history, a physical exam, and tests. Tests may include: Tests to evaluate brain function, such as memory tests, cognitive tests, and other tests. Lab tests, such as blood or urine tests. Imaging tests, such as a CT scan, a PET scan, or an MRI. Genetic testing. This may be  done  if other family members have a diagnosis of certain types of dementia. Your health care provider will talk with you and your family, friends, orcaregivers about your history and symptoms. How is this treated? Treatment for this condition depends on the cause of the dementia. Progressive dementias, such as Alzheimer's disease, cannot be cured, but there may betreatments that help to manage symptoms. Treatment might involve taking medicines that may help to: Control the dementia. Slow down the progression of the dementia. Manage symptoms. In some cases, treating the cause of your dementia can improve symptoms,reverse symptoms, or slow down how quickly your dementia becomes worse. Your health care provider can direct you to support groups, organizations, andother health care providers who can help with decisions about your care. Follow these instructions at home: Medicines Take over-the-counter and prescription medicines only as told by your health care provider. Use a pill organizer or pill reminder to help you manage your medicines. Avoid taking medicines that can affect thinking, such as pain medicines or sleeping medicines. Lifestyle Make healthy lifestyle choices. Be physically active as told by your health care provider. Do not use any products that contain nicotine or tobacco, such as cigarettes, e-cigarettes, and chewing tobacco. If you need help quitting, ask your health care provider. Do not drink alcohol. Practice stress-management techniques when you get stressed. Spend time with other people. Make sure to get quality sleep. These tips can help you get a good night's rest: Avoid napping during the day. Keep your sleeping area dark and cool. Avoid exercising during the few hours before you go to bed. Avoid caffeine products in the evening. Eating and drinking Drink enough fluid to keep your urine pale yellow. Eat a healthy diet. General instructions  Work with your health care  provider to determine what you need help with and what your safety needs are. Talk with your health care provider about whether it is safe for you to drive. If you were given a bracelet that identifies you as a person with memory loss or tracks your location, make sure to wear it at all times. Work with your family to make important decisions, such as advance directives, medical power of attorney, or a living will. Keep all follow-up visits. This is important.  Where to find more information Alzheimer's Association: [employer] National Institute on Aging: DVDEnthusiasts.nl World Health Organization: [employer]Link.com.br Contact a health care provider if: You have any new or worsening symptoms. You have problems with choking or swallowing. Get help right away if: You feel depressed or sad, or feel that you want to harm yourself. Your family members become concerned for your safety. If you ever feel like you may hurt yourself or others, or have thoughts about taking your own life, get help right away. Go to your nearest emergency department or: Call your local emergency services (911 in the [place] Call a suicide crisis helpline, such as the [employer] at 623-786-3365. This is open 24 hours a day in the [place] Text the Crisis Text Line at 917-085-3031 (in the [place].). Summary Dementia is a condition that affects the way the brain functions. Dementia often affects memory and thinking. Usually, dementia gets worse with time and cannot be reversed (progressive dementia). Treatment for this condition depends on the cause of the dementia. Work with your health care provider to determine what you need help with and what your safety needs are. Your health care provider can direct you to support groups, organizations, and other health care  providers who can help with decisions about your care. This information is not intended to replace advice given to you by your health care  provider. Make sure you discuss any questions you have with your healthcare provider. Document Revised: 03/09/2020 Document Reviewed: 03/09/2020 Elsevier Patient Education  [place].

## 2021-05-19 NOTE — Progress Notes (Signed)
Provider:  Larey [name], [name]  Primary NEUROLOGIST : Dr Justin Man, MD :   [name], MD  Reason for visit:   Chief Complaint  Patient presents with   consult    Rm 11, w wife Justin [name]. Pts here to f/u to f/u from hospital visit on 6/24. Pt present to role out encephalitis vs seizures. Pt is still sore and stiff from his left shoulder, but overall doing well today. Pt has seen an ENT doctor for inspire consult. Pt would need another sleep study and never heard back. Pt has pending knee replacement surgery Sept 12.     At the pleasure of meeting [name] and his wife here today.  Justin [name] is a 65 year old stroke patient of Dr. Eustace [name] who has followed frequently with Justin [name] nurse practitioner.  Last visit in the office was Apr 02, 2021 with STROKE and primary neurologist. The patient has had a delirium spell August 29th2021- had contracted COVID, became febrile and acutely confused. Lasted 3 day.  . I had seen the patient in the past for an evaluation of excessive daytime sleepiness in January 2019 and I am not following him aside form SLEEP MEDICINE related issues. . The patient was hospitalized with an acute mental status change on 04/30/2021 he had been observed for several days in the hospital and several hospitalists have followed him.  He saw Dr. Leonel [name] on 25 June with a waxing and waning mental status over the past few weeks no clear defined seizure activity was noted but the patient who has been actively followed by the stroke service became acutely confused around 9 AM the same day the patient was according to his wife significantly confused when she tried to converse with him around noon, and at least confusion persisted there was no meaningful conversation.  He was brought therefore to the emergency department where he also was confirmed as being confused.  He could not give his name no answer any questions.  An MRI of the brain was obtained which demonstrated  mesial temporal hyperintensity on 2 2 T2 imaging due to this finding he was started on IV acyclovir and an LP was attempted.  He was then transferred to [facility] for further evaluation.  He had presented to [facility] at that time.  So apparently the LP was obtained but the MRI showed changes that could be consistent with her her herpes virus encephalitis.  An EEG was then obtained on 05/01/2021 the day of admission. Slowed , left frontotemporal.  lP was normal except elevated glucose level.  D/c discharged already on the 29 because the-29-2022 was a day prior to his birthday- he was at night still confused In hospital but not at home.  Referral was made to neuropsychological testing, he gave was given a final dose of IV steroids, he had been given Protonix thiamine mesalamine, lorazepam, Keppra was started, vitamin B12 is supplemented.  Discharge diagnosis was acute confusion possibly in the realm of encephalitis.  The patient has remained on Keppra 500 mg 2 times daily takes Protonix and vitamin B12 Lipitor vitamin C aspirin Benadryl as needed Lexapro 20 mg daily.   HPI: Justin [name] is a 65 year old male presenting today, 01/14/2020 for a follow up with his wife present. He was last seen on 09/26/2019 for worsening short-term memory loss, which he and his wife feel has improved. He was started on lexapro 51m due to concerns of underlying depression/anxiety affecting  memory with improvement of underlying depression/anxiety.  Most recent MMSE 26/30 on 12/25/2019 with prior 23/30.  He was previously being followed in this office due to left thalamic stroke in 04/2018 with residual right hemisensory impairment. He has continued on aspirin and atorvastatin for secondary stroke prevention. Overall patient and wife feel like his depression, anxiety and memory impairment have improved.  Recently had follow-up with Justin Roch, NP for sleep apnea and endorses ongoing compliance.  Continues on aspirin and atorvastatin for  secondary stroke prevention. Blood pressure today satisfactory 125/79. No further concerns at this time.     History provided for reference purposes only Update 09/26/2019: Justin [name], Sethi,MD Justin [name] is a 65 year old male who is being seen per patient/wife request due to worsening short-term memory loss.  He was previously being followed in this office due to left thalamic stroke in 04/2018 with residual right hemisensory impairment.  He has continued on aspirin and atorvastatin for secondary stroke prevention.  Recent lipid panel showed LDL 61.  Wife has been observing occasional short-term memory concerns over the past few months.  He has continued working and functioning without difficulty but he will occasionally forget certain conversations, triple check to ensure he is completed different tasks and forgetting certain dates.  He does endorse increased stress over the past few months at work where he has no disease last patient with his workers and increased irritability.  He is also been experiencing worsening insomnia and has been having greater difficulty tolerating CPAP mask.  He does have a prior history of anxiety.  He was previously treated on Xanax several years prior. MMSE 23/30 greatest in recall, repetition, and drawing.  GAD-7 score 4.  PHQ 9 score 5.  After further discussing possible depression/anxiety causing concerns of memory, wife has noticed patient being more depressed recently.  Reviewing his medication list, he will take Benadryl occasionally as needed to help with sleep.  Interval history Dr Justin [name] 08/23/2018: Patient returns today for stroke follow-up visit.  He did undergo 30-day cardiac monitor which did show episode of V. tach with heart rate 178 but negative for atrial fibrillation.  Upon initial evaluation in the ED, he reported numbness of the right face, right upper extremity and right lower extremity.  Patient does state that he continues to have right upper extremity  numbness that is constant with a "stiff and tight feeling".  He has continued to work despite his continued numbness but does have intermittent difficulties as he is right-hand dominant.  He also does have some memory concerns stating that he is not as "sharp" as he previously was and will ask the same questions or will forget certain directions.  He continues to take aspirin without side effects of bleeding or bruising.  Continues to take Lipitor without side effects myalgias.  Blood pressure today satisfactory 135/89.  He does continue to state compliance with CPAP but has been finding himself taking the mask off in the middle the night.  He does have a follow-up in this office for OSA management on 09/13/2018.  No further concerns at this time.  Denies new or worsening stroke/TIA symptoms.  05/24/2018 visit:  STROKE follow up -[name] ill is a 65 year old male patient whom I had initially seen earlier this year for evaluation of sleep apnea unfortunately the patient underwent a right-sided inguinal hernia repair for which she had to discontinue his full size aspirin 5 days prior.  Also aspirin was removed within 2 or 3 days post surgery  he did suffer a stroke.  And his main symptom was right-sided numbness and clumsiness sensory loss of the right dominant hand.  I see him today after he has undergone a stroke work-up in the hospital which included a head CT without contrast on 7 June, an MRI MRA of head and neck, the findings were an acute lacunar infarct in the left lateral thalamus there was no hemorrhage, he had negative abnormalities he was negative for abnormalities in the vascular tree his neck MRA showed mild atherosclerosis at the origins of the left internal carotid artery and right subclavian artery but not significant stenosis and he had very mild for age nonspecific white matter signal changes these are commonly seen the small vessel disease there was no brain atrophy noted.  He had presented to  the hospital ED with elevated blood pressures which may explain the lacunar appearance of a previous stroke.  Head CT was negative.  Onset of symptoms was on 13 April 2018 he was evaluated by Dr. Kerney Elbe neurologist on-call, who also mentioned that the patient has a history of obstructive sleep apnea and is currently treated at 9 cmH2O pressure.  The patient restarted on aspirin metabolic panels were normal his creatinine was 1.05 his BUN 24 which may indicate that he was slightly dehydrated, his CBC was normal but that the count was 9.6K hemoglobin 15.1 hematocrit 50.1 without any evidence of anemia normal platelet count 350 3K.  Cardiac enzymes were negative his total cholesterol was actually rather low at 133 ng but is good "" cholesterol HDL was only 30.      Review of Systems: Out of a complete 14 system review, the patient complains of only the following symptoms, and all other reviewed systems are negative. Memory loss, anxiety, depression    Social History   Socioeconomic History   Marital status: Married    Spouse name: Justin [name]   Number of children: Not on file   Years of education: Not on file   Highest education level: Not on file  Occupational History   Not on file  Tobacco Use   Smoking status: Never   Smokeless tobacco: Never  Vaping Use   Vaping Use: Never used  Substance and Sexual Activity   Alcohol use: No    Alcohol/week: 0.0 standard drinks   Drug use: No   Sexual activity: Yes  Other Topics Concern   Not on file  Social History Narrative   Lives with wife   Social Determinants of Health   Financial Resource Strain: Not on file  Food Insecurity: Not on file  Transportation Needs: Not on file  Physical Activity: Not on file  Stress: Not on file  Social Connections: Not on file  Intimate Partner Violence: Not on file    Family History  Problem Relation Age of Onset   Tuberculosis Mother    Stroke Mother    Heart disease Father        died at 23    Bradycardia Brother        pacemaker   Stroke Brother    Cancer Paternal Uncle    Mental illness Paternal Grandfather        suicide at 66   Colon cancer Neg Hx     Past Medical History:  Diagnosis Date   Anxiety    Diarrhea    Hypertension    OSA (obstructive sleep apnea)    cpap 9 cm H2O   Stroke ([place])  Past Surgical History:  Procedure Laterality Date   BIOPSY  06/21/2017   Procedure: BIOPSY;  Surgeon: Rogene Houston, MD;  Location: [facility];  Service: Endoscopy;;  rectal, righta and left colon;   COLONOSCOPY N/A 06/21/2017   Procedure: COLONOSCOPY;  Surgeon: Rogene Houston, MD;  Location: [facility];  Service: Endoscopy;  Laterality: N/A;  1:25   INGUINAL HERNIA REPAIR Right 04/04/2018   Procedure: HERNIA REPAIR INGUINAL ADULT WITH MESH;  Surgeon: Aviva Signs, MD;  Location: [facility] OR[facility];  Service: General;  Laterality: Right;   INGUINAL HERNIA REPAIR Right 01/31/2019   Procedure: RECURRENT RIGHT INGUINAL HERNIORRHAPY WITH MESH, INCARCERATED;  Surgeon: Aviva Signs, MD;  Location: [facility];  Service: General;  Laterality: Right;   KNEE SURGERY     tore mcl in high school (70's)    Current Outpatient Medications  Medication Sig Dispense Refill   Ascorbic Acid (VITAMIN C) 100 MG tablet Take 100 mg by mouth daily. OTC     aspirin EC 81 MG tablet Take 1 tablet (81 mg total) by mouth daily. Swallow whole. 150 tablet 2   atorvastatin (LIPITOR) 40 MG tablet TAKE ONE TABLET BY MOUTH AT 6PM 90 tablet 3   diphenhydrAMINE (BENADRYL) 25 MG tablet Take 25 mg by mouth at bedtime as needed.      escitalopram (LEXAPRO) 20 MG tablet Take 1 tablet (20 mg total) by mouth at bedtime. 90 tablet 3   levETIRAcetam (KEPPRA) 500 MG tablet Take 1 tablet (500 mg total) by mouth 2 (two) times daily. 60 tablet 1   Lido-Capsaicin-Men-Methyl Sal 0.5-0.035-5-20 % PTCH Apply 1 patch topically daily as needed (shoulder neck pr). 30 patch 0   Mesalamine (DELZICOL) 400 MG CPDR DR capsule Take 2  capsules (800 mg total) by mouth 2 (two) times daily. 360 capsule 3   pantoprazole (PROTONIX) 40 MG tablet Take 1 tablet (40 mg total) by mouth daily. 30 tablet 0   vitamin B-12 (CYANOCOBALAMIN) 500 MCG tablet Take 1 tablet (500 mcg total) by mouth daily. 30 tablet 1   No current facility-administered medications for this visit.    Allergies as of 05/19/2021   (No Known Allergies)    Vitals: Today's Vitals   05/19/21 1437  BP: (!) 147/85  Pulse: (!) 53  Weight: 201 lb 8 oz (91.4 kg)  Height: 6' 1"  (1.854 m)   Body mass index is 26.58 kg/m.  .mmse  Physical exam:  General: well developed, well nourished, pleasant middle-age Caucasian male, seated, in no evident distress Head: head normocephalic and atraumatic.   Neck: supple with no carotid or supraclavicular bruits Cardiovascular: regular rate and rhythm, no murmurs Musculoskeletal: no deformity Skin:  no rash/petichiae Vascular:  Normal pulses all extremities   Neurologic Exam Mental Status: Awake and fully alert. Oriented to place and time. Recent memory impaired and remote memory intact. Attention span, concentration and fund of knowledge appropriate during visit. Mood and affect not fully appropriate.  MMSE - Mini Mental State Exam 05/19/2021 03/29/2021 09/28/2020 05/18/2020 12/25/2019 09/26/2019  Not completed: - - - - (No Data) -  Orientation to time 2 3 4 2 3 4   Orientation to Place 4 5 5 5 5 5   Registration 3 3 3 3 3 3   Attention/ Calculation 1 5 2 3 5 3   Recall 0 0 0 0 1 1  Language- name 2 objects 2 2 2 2 2 2   Language- repeat 1 1 1 1 1  0  Language- follow 3 step  command 2 3 3 3 3 3   Language- read & follow direction 0 1 1 1 1 1   Write a sentence 1 1 1 1 1 1   Copy design 0 0 0 1 1 0  Copy design-comments - - 6 animals - - -  Total score 16 24 22 22 26 23      Cranial Nerves:  Pupils equal, briskly reactive to light. Extraocular movements full without nystagmus. Visual fields full to confrontation. Hearing  intact. Facial sensation intact. Face, tongue, palate moves normally and symmetrically.  Motor: Normal bulk and tone. Normal strength in all tested extremity muscles. Sensory.: intact to touch , pinprick , position and vibratory sensation.  Coordination: Rapid alternating movements normal in all extremities. Finger-to-nose and heel-to-shin performed accurately bilaterally. Gait and Station: Arises from chair without difficulty. Stance is normal. Gait demonstrates normal stride length and balance Reflexes: 1+ and symmetric. Toes downgoing.      Assessment: Justin [name] is a 65 year old caucasian male who is being seen today for follow-up after recent hospitalization with acute confusion, possible encephalopathy due to viral infection.  Had a similar admission in 06-2020 after he contracted covid, developed COVID encephalopathy at that time, 3 days of confusion.   I evaluated him regarding short-term memory loss complaints today and he has scored only 14/30 points on MMSE.  He will need a follow up EEG - to compare with hospital based record.he is not allowe to drive due to mental status, he will remain on Keppra for at least 6 month.  Repeat MRI brain. Start aricept 5 mg daily, and next visit consider increasing to 10 mg.  .  A referral to neuropsychologist was made from hospital for August 2022.    History of left thalamic infarct in 04/14/2018 secondary to small vessel disease, HTN, HLD .no acute stroke was seen.   He has OSA and has been using CPAP for over 3.5 years. With good resolution-  not a good candidate for ENT endoscopy given that this requires anesthesia.    Plan:  repeat HST later in the year when recovered.  Repeat memory testing in next visit with primary neurologist.   --per  Justin [name]: Ongoing use of CPAP for OSA management -recently seen by  NP with compliance and recommended a 62month follow-up.  Will combine stroke and dementia  follow-up at that time.  Follow-up in 4  months or call earlier if needed  I spent 30 minutes of face-to-face and non-face-to-face time with patient.  This included previsit chart review, lab review, study review, order entry, electronic health record documentation, patient education   CLarey Seat MD   [facility] [address]SMattawanaGPeetz [place] [address] Phone [phone] [phone]: This document was prepared with digital dictation and possible smart phrase technology. Any transcriptional errors that result from this process are unintentional.

## 2021-06-02 LAB — MISC LABCORP TEST (SEND OUT): Labcorp test code: 9985

## 2021-06-07 ENCOUNTER — Ambulatory Visit (HOSPITAL_COMMUNITY)
Admission: RE | Admit: 2021-06-07 | Discharge: 2021-06-07 | Disposition: A | Discharge status: Home / Self Care | Payer: Medicare Other | Source: Ambulatory Visit | Attending: Neurology | Admitting: Neurology

## 2021-06-07 ENCOUNTER — Other Ambulatory Visit: Payer: Self-pay

## 2021-06-07 DIAGNOSIS — U071 COVID-19: Secondary | ICD-10-CM | POA: Insufficient documentation

## 2021-06-07 DIAGNOSIS — R4189 Other symptoms and signs involving cognitive functions and awareness: Secondary | ICD-10-CM | POA: Insufficient documentation

## 2021-06-07 DIAGNOSIS — G9349 Other encephalopathy: Secondary | ICD-10-CM | POA: Diagnosis present

## 2021-06-07 DIAGNOSIS — R9401 Abnormal electroencephalogram [EEG]: Secondary | ICD-10-CM | POA: Diagnosis present

## 2021-06-07 DIAGNOSIS — R9089 Other abnormal findings on diagnostic imaging of central nervous system: Secondary | ICD-10-CM | POA: Diagnosis present

## 2021-06-07 DIAGNOSIS — F0150 Vascular dementia without behavioral disturbance: Secondary | ICD-10-CM | POA: Diagnosis present

## 2021-06-07 MED ORDER — GADOBUTROL 1 MMOL/ML IV SOLN
9.0000 mL | Freq: Once | INTRAVENOUS | Status: AC | PRN
  Administered 2021-06-07: 9 mL via INTRAVENOUS

## 2021-06-08 ENCOUNTER — Telehealth: Payer: Self-pay | Admitting: Neurology

## 2021-06-08 NOTE — Telephone Encounter (Signed)
Called to review the MRI results. There was no answer Left a message advising them to call back.

## 2021-06-08 NOTE — Progress Notes (Signed)
IMPRESSION: 1. No acute intracranial abnormality. 2. Unchanged hyperintense T2-weighted signal within the anterior left temporal lobe. This may be due to remote infarct, encephalitis or a low-grade glioma. Continued follow-up imaging may be helpful to ensure stability. 3. Findings of mild chronic microvascular disease and generalized volume loss without a clear lobar predilection.   Electronically Signed   By: Ulyses Jarred M.D.   On: 06/08/2021 03:16  This MRI does not show stroke, tumor or bleed, but some generalized volume loss, atrophy. There was mentioning of a anterior temporal lobe hyperintensity- this can be indicative of an infection/ encephalitis.  CSF testing was performed to rule out herpes viral encephalitis. The patient reported a swollen left eye in late May, was in the Ed 04-30-2021. Negative CSF findings.  Depending on patients recovery,we can follow with another MRI brain in 3 month-

## 2021-06-08 NOTE — Telephone Encounter (Signed)
-----   Message from [name], MD sent at 06/08/2021 12:33 PM EDT ----- IMPRESSION: 1. No acute intracranial abnormality. 2. Unchanged hyperintense T2-weighted signal within the anterior left temporal lobe. This may be due to remote infarct, encephalitis or a low-grade glioma. Continued follow-up imaging may be helpful to ensure stability. 3. Findings of mild chronic microvascular disease and generalized volume loss without a clear lobar predilection.   Electronically Signed   By: Ulyses Jarred M.D.   On: 06/08/2021 03:16  This MRI does not show stroke, tumor or bleed, but some generalized volume loss, atrophy. There was mentioning of a anterior temporal lobe hyperintensity- this can be indicative of an infection/ encephalitis.  CSF testing was performed to rule out herpes viral encephalitis. The patient reported a swollen left eye in late May, was in the Ed 04-30-2021. Negative CSF findings.  Depending on patients recovery,we can follow with another MRI brain in 3 month-

## 2021-06-09 ENCOUNTER — Other Ambulatory Visit: Payer: Self-pay

## 2021-06-09 ENCOUNTER — Ambulatory Visit (INDEPENDENT_AMBULATORY_CARE_PROVIDER_SITE_OTHER): Payer: Medicare Other | Admitting: Neurology

## 2021-06-09 DIAGNOSIS — G9349 Other encephalopathy: Secondary | ICD-10-CM

## 2021-06-09 DIAGNOSIS — U071 COVID-19: Secondary | ICD-10-CM

## 2021-06-09 DIAGNOSIS — R4189 Other symptoms and signs involving cognitive functions and awareness: Secondary | ICD-10-CM

## 2021-06-09 DIAGNOSIS — R9089 Other abnormal findings on diagnostic imaging of central nervous system: Secondary | ICD-10-CM

## 2021-06-09 DIAGNOSIS — R9401 Abnormal electroencephalogram [EEG]: Secondary | ICD-10-CM

## 2021-06-09 DIAGNOSIS — R410 Disorientation, unspecified: Secondary | ICD-10-CM

## 2021-06-09 NOTE — Telephone Encounter (Signed)
Pt's wife returned call and I was able to review the MRI report with her. Pt completed the EEG and I advised that once that report has been read we will review that with them as well. Advised that we need to keep the scheduled apt for November . She was appreciative for the call and had no other questions at this time.

## 2021-06-12 ENCOUNTER — Other Ambulatory Visit (INDEPENDENT_AMBULATORY_CARE_PROVIDER_SITE_OTHER): Payer: Self-pay | Admitting: Internal Medicine

## 2021-06-15 ENCOUNTER — Other Ambulatory Visit: Payer: Self-pay | Admitting: Family Medicine

## 2021-06-25 NOTE — Progress Notes (Signed)
Please enter orders for PAT visit scheduled for 07-08-21.

## 2021-06-30 ENCOUNTER — Telehealth: Payer: Self-pay | Admitting: Family Medicine

## 2021-06-30 ENCOUNTER — Telehealth: Payer: Self-pay | Admitting: Neurology

## 2021-06-30 NOTE — Telephone Encounter (Signed)
Received call from patient's spouse [name] to follow up on 2nd preop risk eval form faxed to office from [facility]. This form needs to be filled out a 2nd time since patient was hospitalized after the first form was completed.   Form received this morning via fax and placed in hanging folder beside nurse's desk.

## 2021-06-30 NOTE — Telephone Encounter (Signed)
FYI- pt's wife called stating her husband is scheduled to have knee surgery on 07/19/2021. She states Dr. Wynelle Link sent over a fax for clearance and needs to be completed. Please advise.

## 2021-06-30 NOTE — Telephone Encounter (Signed)
Routed to provider.   Will patient require OV for labs?

## 2021-06-30 NOTE — Telephone Encounter (Signed)
Debra- we have not received this. Can you follow up on this? Thank you

## 2021-07-01 ENCOUNTER — Telehealth: Payer: Self-pay | Admitting: Emergency Medicine

## 2021-07-01 NOTE — Telephone Encounter (Signed)
Call placed to patient and patient made aware.  

## 2021-07-01 NOTE — Telephone Encounter (Signed)
Surgical Clearance faxed to [facility]  OK transmission received.

## 2021-07-06 ENCOUNTER — Other Ambulatory Visit: Payer: Self-pay

## 2021-07-06 ENCOUNTER — Ambulatory Visit (INDEPENDENT_AMBULATORY_CARE_PROVIDER_SITE_OTHER): Payer: Medicare Other | Admitting: Psychology

## 2021-07-06 ENCOUNTER — Ambulatory Visit: Payer: Medicare Other | Admitting: Psychology

## 2021-07-06 ENCOUNTER — Encounter: Payer: Self-pay | Admitting: Psychology

## 2021-07-06 DIAGNOSIS — R4189 Other symptoms and signs involving cognitive functions and awareness: Secondary | ICD-10-CM

## 2021-07-06 DIAGNOSIS — G9341 Metabolic encephalopathy: Secondary | ICD-10-CM | POA: Diagnosis not present

## 2021-07-06 DIAGNOSIS — F0150 Vascular dementia without behavioral disturbance: Secondary | ICD-10-CM | POA: Diagnosis not present

## 2021-07-06 DIAGNOSIS — I6381 Other cerebral infarction due to occlusion or stenosis of small artery: Secondary | ICD-10-CM

## 2021-07-06 NOTE — Progress Notes (Signed)
   Psychometrician Note   Cognitive testing was administered to Justin [name] by Milana Kidney, B.S. (psychometrist) under the supervision of Dr. Christia Reading, Ph.D., licensed psychologist on 07/06/21. Mr. Oatis did not appear overtly distressed by the testing session per behavioral observation or responses across self-report questionnaires. Rest breaks were offered.    The battery of tests administered was selected by Dr. Christia Reading, Ph.D. with consideration to Mr. Kulikowski current level of functioning, the nature of his symptoms, emotional and behavioral responses during interview, level of literacy, observed level of motivation/effort, and the nature of the referral question. This battery was communicated to the psychometrist. Communication between Dr. Christia Reading, Ph.D. and the psychometrist was ongoing throughout the evaluation and Dr. Christia Reading, Ph.D. was immediately accessible at all times. Dr. Christia Reading, Ph.D. provided supervision to the psychometrist on the date of this service to the extent necessary to assure the quality of all services provided.    CHAU SAWIN will return within approximately 1-2 weeks for an interactive feedback session with Dr. Melvyn Novas at which time his test performances, clinical impressions, and treatment recommendations will be reviewed in detail. Mr. Daws understands he can contact our office should he require our assistance before this time.  A total of 125 minutes of billable time were spent face-to-face with Mr. Taubman by the psychometrist. This includes both test administration and scoring time. Billing for these services is reflected in the clinical report generated by Dr. Christia Reading, Ph.D.  This note reflects time spent with the psychometrician and does not include test scores or any clinical interpretations made by Dr. Melvyn Novas. The full report will follow in a separate note.

## 2021-07-06 NOTE — Progress Notes (Signed)
NEUROPSYCHOLOGICAL EVALUATION [place] [place] Department of Neurology  Date of Evaluation: July 06, 2021  Reason for Referral:   Justin [name] is a 65 y.o. right-handed Caucasian male referred by  Frann Rider, NP , to characterize his current cognitive functioning and assist with diagnostic clarity and treatment planning in the context of prior left thalamic lacunar infarct and suspected ongoing cognitive decline.   Assessment and Plan:   Clinical Impression(s): During testing, Mr. [name] was noted to be extremely tangential. For example, when asked to repeat back a previously read story, he responded with "water hydrants; I'm from [place] which is nothing compared to [place]; A number of birthdays have caught up with me; who was the president after Merrilyn Puma?" He was noted to be very hard to redirect and had significant difficulty focusing on tasks for extended periods of time. Several tasks were discontinued due to either ongoing confusion or the inability to focus long enough to complete an adequate attempt. Across tasks that were completed, Mr. [name]'s pattern of performance is suggestive of fairly diffuse cognitive impairment with scores generally falling in exceptionally low ranges relative to age-matched peers. His lone strengths were seen across basic attention and confrontation naming. However, all other domains were notably impaired. This includes processing speed, complex attention, executive functioning, safety/judgment, receptive language, verbal fluency, visuospatial abilities, and all aspects of learning and memory. Mr. [name] and his wife reported that she had taken over medication, financial and bill paying responsibilities and that Mr. [name] had stopped driving in part due to cognitive concerns. Evidence for functional disruption, coupled with evidence for significant cognitive dysfunction described above, suggests that he meets criteria for a Major  Neurocognitive Disorder ("dementia") at the present time.  Regarding etiology, there is a strong likelihood for a significant vascular component to ongoing cognitive decline (i.e., vascular dementia presentation). His medical history is noteworthy for hypertension and hyperglycemia, as well as a prior left thalamic lacunar infarct. Specific to the latter, cognitive deficits surrounding processing speed, attention/concentration, executive functioning, and learning and memory are commonly seen in individuals with a prior stroke history. Specific to the thalamus, this region is a highly vital area of the brain responsible for serving as a relay station for motor and sensory information. However, thalamic strokes can also yield high levels of cognitive impairment given the thalamus' role in receiving and sending information throughout the brain and body. Common areas of impairment would include memory and executive functioning, as well as deficits with expressive language in left-sided cases (assuming normal lateralization in this right-handed individual).     His wife reported a history of COVID-19 which appeared to worsen cognitive dysfunction. The research on COVID-19 related cognitive impairment is still in infant stages and there is much conflicting information and ongoing debate. However, as worst cases of this illness are commonly associated with pre-existing cardiovascular illness, it seems a reasonable assumption that this illness could worsen his overall presentation, likely to a mild extent. Additionally of note, a history of metabolic encephalopathy is commonly associated with cognitive dysfunction surrounding processing speed, attention/concentration, and executive functioning. Furthermore, a history of encephalitis (as suspected but seemingly not confirmed based upon medical records) would also yield cognitive dysfunction in similar domains. Taken together, it would seem that a vascular dementia  presentation is most appropriate, likely exacerbated by his history of COVID-19 and acute metabolic encephalopathy due to suspected encephalitis.   I cannot rule out an underlying co-occurring degenerative brain disease such as Alzheimer's  disease. He was fully amnestic across all memory tasks, which would further raise concerns. However, this could potentially be accounted for by signal abnormalities in his left mesial temporal lobe, potentially caused by encephalitis. Unfortunately, severe impairment was diffuse where no clear patterns across testing emerged. [name] did report that memory concerns may have been present prior to his stroke, which would make this plausible. However, at this point, it will be very difficult for cognitive testing to discern between potential Alzheimer's disease and the combined effect of everything listed above. Continued medical monitoring will be important moving forward.   Recommendations: Given that he is only two months out from his metabolic encephalopathy hospitalization, some improvement could occur as time progresses. A repeat evaluation in 12 months could certainly be considered to better assess the stability of cognitive impairment.   I agree with Mr. Gendreau and his wife's decision to have him retire/stop working. Given the extent of cognitive impairment, especially his tangential thought processes and significant distractibility, I do not feel that he would be able to gain and hold meaningful employment at the present time. I would also have him rely on his wife as they are reportedly in the process of selling and/or ceasing personal business operations.  Additionally, given the same deficits mentioned above, I would recommend that he fully abstain from driving even after seizure precautions are lifted. This would be pending the completion of a formal driving evaluation. Should he or his family wish to pursue a formalized driving evaluation, they would be  encouraged to contact [employer] in [place], [place] at [phone]. Another option would be through [facility]; however, the latter would likely require a referral from a medical doctor. [facility] can be reached directly at [phone].   I would encourage Mr. Fife to utilize his CPAP machine throughout the entire night rather than only partially. Poorly managed sleep apnea will increase his risk for additional stroke, as well as heart attack and further cognitive decline. He and his wife could certainly pursue an Inspire device implantation if they chose.   Mr. Maready will likely benefit from the establishment and maintenance of a routine in order to maximize his functional abilities over time.  Mr. Morales had notable trouble comprehending and understanding what others were saying to him during testing. It will be important for him to have another person with him when in situations where he may need to process information, weigh the pros and cons of different options, and make decisions, in order to ensure that he fully understands and recalls all information to be considered.  Mr. Honeywell is encouraged to attend to lifestyle factors for brain health (e.g., regular physical exercise, good nutrition habits, regular participation in cognitively-stimulating activities, and general stress management techniques), which are likely to have benefits for both emotional adjustment and cognition. Optimal control of vascular risk factors (including safe cardiovascular exercise and adherence to dietary recommendations) is encouraged.   Important information to remember should be provided in written format in all instances. This should be placed in a highly visible and commonly frequented location of his residence to try and help promote recall.   To address problems with processing speed, he may wish to consider:   -Ensuring that he is alerted when essential material or instructions are  being presented   -Adjusting the speed at which new information is presented   -Allowing for more time in comprehending, processing, and responding in conversation  To address problems with  fluctuating attention, he may wish to consider:   -Avoiding external distractions when needing to concentrate   -Limiting exposure to fast paced environments with multiple sensory demands   -Writing down complicated information and using checklists   -Attempting and completing one task at a time (i.e., no multi-tasking)   -Verbalizing aloud each step of a task to maintain focus   -Reducing the amount of information considered at one time  Review of Records:   Mr. Caseres was seen by [facility] Frann Rider, NP) on 03/29/2021 for follow-up. Prior to that visit, Mr. Matuszak wife had sent Ms. McCue a message expressing concern surrounding short-term memory decline and diminished processing speed. He has continued to work since his stroke and his wife expressed concerns surrounding his ability to work adequately given cognitive decline. CPAP compliance showed usage 30 out of the past 30 days. However, only 15 of these days showed usage for greater than four hours. Performance on a brief cognitive screening instrument (MMSE) was 24/30. Ultimately, Mr. Senters was referred for a comprehensive neuropsychological evaluation to characterize his cognitive abilities and to assist with diagnostic clarity and treatment planning.   He was admitted to the ED on 04/30/2021 with increasing confusion. At this time, his brain MRI revealed an abnormal signal enhancement in the left temporal lobe, with potential causes said to include a subacute infarction, encephalitis, or seizure activity. LP was not consistent with bacterial infection. HSV was negative and VDRL was nonreactive. EEG revealed mild diffuse slowing, but with also some focal slowing in the left frontotemporal region. Vitamin B12 was found to be mildly low.  He was ultimately diagnosed with acute metabolic encephalopathy. Per medical records, the most likely cause for this was believed to be autoimmune encephalitis at that time.   Mr. Chiarelli was most recently seen at [facility] [name], M.D.) on 05/19/2021 for follow-up. However, Dr. Brett Fairy indicated that she was not following Mr. Tompson aside from sleep medicine-related issues. Performance on a brief cognitive screening instrument (MMSE) was 16/30 at that time.   Brain MRI on 04/14/2018 revealed an acute infarct in the left lateral thalamus without associated hemorrhage or mass effect. Brain MRA at that time was unremarkable. Mild for age small vessel ischemic disease was also noted. Brain MRI on 05/29/2020 was largely stable with minimal reported progression of small vessel disease. Brain MRI on 04/30/2021 revealed abnormal signal involving the mesial left temporal lobe. Differential considerations included subacute infarction, encephalitis, or seizure. Brain MRI on 06/08/2021 was stable. Mild atrophy was said to be generalized without a clear lobar predilection.   Past Medical History:  Diagnosis Date   Acid reflux 62/95/2841   Acute metabolic encephalopathy 32/44/0102   Anxiety    Cerebral embolism with cerebral infarction 04/15/2018   Chest pain 05/19/2015   EEG abnormality without seizure 05/19/2021   Encephalitis 04/30/2021   Encephalopathy due to COVID-19 virus    Episodic confusion 05/19/2021   Hyperglycemia 05/19/2015   Hypertension    Left thalamic lacunar infarct 05/24/2018   OSA (obstructive sleep apnea)    cpap 9 cm H2O   Recurrent unilateral inguinal hernia with obstruction and without gangrene    SBO (small bowel obstruction)    Ulcerative colitis 09/24/2019   Vascular dementia without behavioral disturbance 05/19/2021    Past Surgical History:  Procedure Laterality Date   BIOPSY  06/21/2017   Procedure: BIOPSY;  Surgeon: Rogene Houston, MD;  Location: [facility]  ENDO [facility];  Service: Endoscopy;;  rectal,  righta and left colon;   COLONOSCOPY N/A 06/21/2017   Procedure: COLONOSCOPY;  Surgeon: Rogene Houston, MD;  Location: [facility] ENDO [facility];  Service: Endoscopy;  Laterality: N/A;  1:25   INGUINAL HERNIA REPAIR Right 04/04/2018   Procedure: HERNIA REPAIR INGUINAL ADULT WITH MESH;  Surgeon: Aviva Signs, MD;  Location: [facility] ORS;  Service: General;  Laterality: Right;   INGUINAL HERNIA REPAIR Right 01/31/2019   Procedure: RECURRENT RIGHT INGUINAL HERNIORRHAPY WITH MESH, INCARCERATED;  Surgeon: Aviva Signs, MD;  Location: [facility] ORS;  Service: General;  Laterality: Right;   KNEE SURGERY     tore mcl in high school (70's)    Current Outpatient Medications:    acetaminophen (TYLENOL) 500 MG tablet, Take 500 mg by mouth every 6 (six) hours as needed (pain)., Disp: , Rfl:    aspirin EC 81 MG tablet, Take 1 tablet (81 mg total) by mouth daily. Swallow whole., Disp: 150 tablet, Rfl: 2   atorvastatin (LIPITOR) 40 MG tablet, TAKE ONE TABLET BY MOUTH AT 6PM, Disp: 90 tablet, Rfl: 3   diphenhydrAMINE (BENADRYL) 25 MG tablet, Take 25 mg by mouth daily as needed for allergies., Disp: , Rfl:    donepezil (ARICEPT) 5 MG tablet, Take 1 tablet (5 mg total) by mouth at bedtime., Disp: 30 tablet, Rfl: 3   escitalopram (LEXAPRO) 20 MG tablet, Take 1 tablet (20 mg total) by mouth at bedtime., Disp: 90 tablet, Rfl: 3   levETIRAcetam (KEPPRA) 500 MG tablet, Take 1 tablet (500 mg total) by mouth 2 (two) times daily., Disp: 60 tablet, Rfl: 5   Lido-Capsaicin-Men-Methyl Sal 0.5-0.035-5-20 % PTCH, Apply 1 patch topically daily as needed (shoulder neck pr). (Patient not taking: Reported on 07/01/2021), Disp: 30 patch, Rfl: 0   Mesalamine (DELZICOL) 400 MG CPDR DR capsule, Take 2 capsules (800 mg total) by mouth 2 (two) times daily., Disp: 360 capsule, Rfl: 3   pantoprazole (PROTONIX) 40 MG tablet, TAKE 1 TABLET BY MOUTH EVERY DAY, Disp: 30 tablet, Rfl: 2   vitamin B-12 (CYANOCOBALAMIN) 500 MCG  tablet, Take 1 tablet (500 mcg total) by mouth daily., Disp: 30 tablet, Rfl: 1   vitamin C (ASCORBIC ACID) 500 MG tablet, Take 500 mg by mouth daily., Disp: , Rfl:   Clinical Interview:   The following information was obtained during a clinical interview with Mr. Mangel and his wife prior to cognitive testing.  Cognitive Symptoms: Decreased short-term memory: Endorsed. He was vague and somewhat tangential when describing memory concerns, ultimately acknowledging that they had been happening more often lately. His wife reported notable trouble recalling prior conversations or the names of familiar individuals.  Decreased long-term memory: Denied. Decreased attention/concentration: Endorsed. His wife expressed prominent concerns surrounding this domain, emphasizing that he has significant difficulties staying focused and gets distracted very easily.  Reduced processing speed: Endorsed. Difficulties with executive functions: Endorsed. When answering an unrelated question, Mr. Zingale inadvertently endorsed trouble with multi-tasking. His wife reported significant trouble with organization and decision making. They both denied trouble with impulsivity.  Difficulties with emotion regulation: Denied. However, his wife did state that he has seemed a bit more agitated or frustrated lately.  Difficulties with receptive language: Denied. Difficulties with word finding: Endorsed. Decreased visuoperceptual ability: Denied.  Trajectory of deficits: There remains the potential that subtle or very mild cognitive deficits were present prior to his 2019 thalamic stroke. However, cognitive decline was certainly seen after this event. His wife also reported observing further cognitive decline following his COVID-19 infection in August  2021 which was accompanied by delirium. She further reported additional decline stemming from his bout with acute metabolic encephalopathy due to suspected encephalitis a few months prior to  the current evaluation.   Difficulties completing ADLs: Endorsed. His wife organizes his pillbox and must be present for him to take his medications adequately. Otherwise, she reported that he will take a pill, get distracted, and then not take his other medications. She also reported that he has doubled his dose a few times. His wife also manages all financial and bill paying responsibilities. Mr. Mccambridge does not drive. This was reportedly due to seizure concerns while in the hospital. However, his wife expressed significant concerns surrounding inattention and distractibility causing unsafe driving.    Additional Medical History: History of traumatic brain injury/concussion: Denied. History of stroke: Endorsed (see above). History of seizure activity: Denied. Reportedly, there were concerns surrounding seizure activity during his most recent hospitalization and EEG did show diffuse slowing, as well as focal slowing in the left frontotemporal regions. He was started on AED medications but both he and his wife denied him ever experiencing a witnessed seizure.  History of known exposure to toxins: Denied. Symptoms of chronic pain: Denied. He did report being scheduled for knee replacement surgery in two weeks. Experience of frequent headaches/migraines: Denied. Frequent instances of dizziness/vertigo: Denied. Per his wife, he had sporadically mentioned instances of dizziness when standing up quickly.   Sensory changes: He wears glasses with benefit. Other sensory changes/difficulties (e.g., hearing, taste, or smell) were denied.  Balance/coordination difficulties: Endorsed. He described his balance as "not as good as it used to be." His wife reported that he had PT following his stroke which was very helpful and he did well. She noted that he may trip while walking but they did not report any completed falls in the recent past. Currently, one side of the body was not said to be worse than the other.   Other motor difficulties: Denied.  Other medical conditions: Medical records report a prior history of COVID-19 infection in August 2021 and again in January 2022. His wife expressed her belief that some cognitive decline was seen following these infections. During his August infection, he was noted to be febrile (103 F) but did not require hospitalization. He did receive infusion treatment. She also noted symptoms of delirium (delusional thinking) during this period which subsided over time. In addition to this, there were concerns that his recent bout of metabolic encephalopathy was caused by a case of encephalitis.   Sleep History: Estimated hours obtained each night: He did not provide a numerical estimation, stating that he has good nights and bad nights.  Difficulties falling asleep: Denied. Difficulties staying asleep: Endorsed. His wife noted that he will commonly wake throughout the night, leave the bed, and end up falling asleep in his reclining chair.  Feels rested and refreshed upon awakening: Variably so depending on the quantity and quality of the sleep obtained the night before.   History of snoring: Endorsed. History of waking up gasping for air: Endorsed. Witnessed breath cessation while asleep: Endorsed. He acknowledged a history of obstructive sleep apnea. While he reported using his CPAP machine every night, his wife added that he generally takes this device off in the middle of the night. Medical records support this, stating that he has had 100% compliance but his average nightly use is only approximately four hours.   History of vivid dreaming: Denied. Excessive movement while asleep: Denied. Instances of acting out his  dreams: Denied.  Psychiatric/Behavioral Health History: Depression: Mr. Slatten was somewhat tangential when asked about his current mood. He proceeded to tell a story surrounding his paternal grandfather committing suicide and that his family was not one to  talk about that event or mental illness in general. He appeared unsure if he had ever been formally diagnosed with depression or another mental health condition. However, he does currently take Lexapro. He denied previously working with a Administrator, [employer]. Current or remote suicidal ideation, intent, or plan was denied.  Anxiety: Denied. His wife reported mild situational anxiety, generally when he is in larger crowds or trying to perform several actions at one time.  Mania: Denied. Trauma History: Denied. Visual/auditory hallucinations: Denied. Delusional thoughts: Denied.  Tobacco: Denied. Alcohol: He denied current alcohol consumption as well as a history of problematic alcohol abuse or dependence.  Recreational drugs: Denied.  Family History: Problem Relation Age of Onset   Tuberculosis Mother    Stroke Mother    Heart disease Father        died at 84   Bradycardia Brother        pacemaker   Stroke Brother    Mental illness Paternal Grandfather    Suicidality Paternal Grandfather 34   Cancer Paternal Uncle    Dementia Maternal Uncle 87   Colon cancer Neg Hx    This information was confirmed by Mr. Everingham.  Academic/Vocational History: Highest level of educational attainment: 12 years. He graduated from high school and described himself as an average (B/C) student in academic settings. Math was noted as a potential relative weakness.  History of developmental delay: Denied. History of grade repetition: Denied. Enrollment in special education courses: Denied. History of LD/ADHD: Denied.  Employment: Mr. Lengacher reportedly was able to return to work following his thalamic infarct in 2019. However, his wife expressed ongoing concerns surrounding significant distractibility and trouble focusing. She reported occasionally going with him to work and witnessing this first hand. They decided that Mr. Leoni should abstain from working due to cognitive and safety concerns this past May/June.  He previously worked primarily as an Clinical biochemist.   Evaluation Results:   Behavioral Observations: Mr. Spivack was accompanied by his wife, arrived to his appointment on time, and was appropriately dressed and groomed. He appeared alert and oriented. Observed gait and station were within normal limits. Gross motor functioning appeared intact upon informal observation and no abnormal movements (e.g., tremors) were noted. His affect was generally relaxed and positive. Spontaneous speech was fluent. However, word finding difficulties were observed during interview and he was noted to be quite tangential. There were several occasions where the responses he provided to questions were wholly unrelated to the initial content of the question being asked. Insight into his cognitive difficulties appeared to be largely adequate.   During testing, Mr. Talent was noted to be extremely tangential. For example, when asked to repeat back a previously read story, he responded with "water hydrants; I'm from [place] which is nothing compared to [place]; A number of birthdays have caught up with me; who was the president after Merrilyn Puma?" He was noted to be very hard to redirect and had significant difficulty focusing on tasks for extended periods of time. Several tasks were discontinued due to either ongoing confusion or the inability to focus long enough to complete an adequate attempt. When he was able to focus, take engagement appeared adequate and he did persist well. Overall, Mr. Rosencrans was cooperative to the best of his abilities with  the clinical interview and subsequent testing procedures.   Adequacy of Effort: The validity of neuropsychological testing is limited by the extent to which the individual being tested may be assumed to have exerted adequate effort during testing. Mr. Dewalt expressed his intention to perform to the best of his abilities and exhibited adequate task engagement and persistence. Scores across  stand-alone and embedded performance validity measures were variable. However, his one below expectation performance is believed to be due to true cognitive dysfunction rather than poor engagement or attempts to perform poorly. As such, the results of the current evaluation are believed to be a valid representation of Mr. Blazejewski current cognitive functioning.  Test Results: Mr. Deboard was poorly oriented at the time of the current evaluation. He was unable to state the current date, day of the week, time, location [facility] orthopedic"), or reason for the current evaluation.   Intellectual abilities based upon educational and vocational attainment were estimated to be in the average range. Premorbid abilities were estimated to be within the below average range based upon a single-word reading test.   Processing speed was exceptionally low to well below average. Basic attention was below average to average. More complex attention (e.g., working memory) was variable, ranging from the exceptionally low to average normative ranges. Executive functioning was exceptionally low to well below average. He also performed in the well below average on a task assessing safety and judgment. Several responses were quite odd and inaccurate. For example, when asked about the importance of blowing out a candle before going to bed, he responded with "they must have a flashlight in their hands." When asked the importance of your doctor being informed of all your medications, he responded with "if you went to the dentist and your gum is bleeding, you won't get an infection in your gums."   Assessed receptive language abilities were exceptionally low. Mr. Kamara had trouble across all aspects of this task, including sequencing, understanding conceptual information, understanding more complex sentence structure, and following multi-step commands. This mirrors observations during interview and testing where he was extremely  tangential and would often provide information not relevant to the question asked of him or the cognitive task he was being asked to complete. Assessed expressive language was exceptionally low across both phonemic and semantic fluency, while confrontation naming was average.     Assessed visuospatial/visuoconstructional abilities were below expectation overall. Points were lost on his drawing of a clock mild errors in numerical spatial arrangement, as well as accurate representation of only one clock hand which was placed in an incorrect location. Points were lost on his copy of a complex figure due to him drawing an outer square rather than a rectangle, as well as generally mild distortions of interior aspects.    Learning (i.e., encoding) of novel verbal information was exceptionally low. Spontaneous delayed recall (i.e., retrieval) of previously learned information was also exceptionally low. Retention rates were 0% across a story learning task, 0% across a list learning task, and 0% across a figure drawing task. Performance across a list learning recognition task was exceptionally low, suggesting minimal evidence for information consolidation. Two other recognition tasks were discontinued due to Mr. Callanan not comprehending how to answer questions appropriately.   A fine motor speed/coordination task was discontinued due to Mr. Kluender not comprehending how to perform this task. He repeatedly attempted to use both hands despite repeated instructions stopping him from doing this.    Results of emotional screening instruments suggested that recent  symptoms of generalized anxiety were in the minimal range, while symptoms of depression were within the mild range. A screening instrument assessing recent sleep quality suggested the presence of minimal sleep dysfunction.  Tables of Scores:   Note: This summary of test scores accompanies the interpretive report and should not be considered in isolation without  reference to the appropriate sections in the text. Descriptors are based on appropriate normative data and may be adjusted based on clinical judgment. Terms such as "Within Normal Limits" and "Outside Normal Limits" are used when a more specific description of the test score cannot be determined.       Percentile - Normative Descriptor > 98 - Exceptionally High 91-97 - Well Above Average 75-90 - Above Average 25-74 - Average 9-24 - Below Average 2-8 - Well Below Average < 2 - Exceptionally Low       Validity:   DESCRIPTOR       ACS Word Choice: --- --- Discontinued  Dot Counting Test: --- --- Within Normal Limits  RBANS Effort Index: --- --- Outside Normal Limits  WAIS-IV Reliable Digit Span: --- --- Within Normal Limits       Orientation:      Raw Score Percentile   NAB Orientation, Form 1 20/29 --- ---       Cognitive Screening:      Raw Score Percentile   SLUMS: 7/30 --- ---       RBANS, Form A: Standard Score/ Scaled Score Percentile   Total Score 50 <1 Exceptionally Low  Immediate Memory 40 <1 Exceptionally Low    List Learning 1 <1 Exceptionally Low    Story Memory 1 <1 Exceptionally Low  Visuospatial/Constructional 72 3 Well Below Average    Figure Copy 6 9 Below Average    Line Orientation 11/20 3-9 Well Below Average  Language 74 4 Well Below Average    Picture Naming 10/10 51-75 Average    Semantic Fluency 1 <1 Exceptionally Low  Attention 60 <1 Exceptionally Low    Digit Span 7 16 Below Average    Coding 1 <1 Exceptionally Low  Delayed Memory 40 <1 Exceptionally Low    List Recall 0/10 <2 Exceptionally Low    List Recognition 9/20 <2 Exceptionally Low    Story Recall 1 <1 Exceptionally Low    Story Recognition Discontinued --- Impaired    Figure Recall 1 <1 Exceptionally Low    Figure Recognition Discontinued --- Impaired       Intellectual Functioning:      Standard Score Percentile   Test of Premorbid Functioning: 81 10 Below Average        Attention/Executive Function:     Trail Making Test (TMT): Raw Score (T Score) Percentile     Part A 79 secs.,  0 errors (26) 1 Exceptionally Low    Part B Discontinued --- Impaired         Scaled Score Percentile   WAIS-IV Digit Span: 5 5 Well Below Average    Forward 8 25 Average    Backward 8 25 Average    Sequencing 3 1 Exceptionally Low        Scaled Score Percentile   WAIS-IV Similarities: 4 2 Well Below Average       D-KEFS Verbal Fluency Test: Raw Score (Scaled Score) Percentile     Letter Total Correct 9 (2) <1 Exceptionally Low    Category Total Correct 14 (2) <1 Exceptionally Low    Category Switching Total Correct 1 (1) <  1 Exceptionally Low    Category Switching Accuracy 0 (1) <1 Exceptionally Low       D-KEFS Design Fluency Test: Raw Score (Scaled Score) Percentile     Condition 1 - Filled Dots 3 (5) 5 Well Below Average    Condition 2 - Empty Dots 1 (3) 1 Exceptionally Low    Condition 3 - Switching Discontinued --- Impaired       NAB Executive Functions Module, Form 1: T Score Percentile     Judgment 29 2 Well Below Average       Language:     NAB Language Module, Form 1: T Score Percentile     Auditory Comprehension 24 <1 Exceptionally Low    Naming 30/31 (56) 73 Average       Visuospatial/Visuoconstruction:      Raw Score Percentile   Clock Drawing: 6/10 --- Impaired       Sensory-Motor:     Ship broker Test: Raw Score Percentile     Dominant Hand Discontinued --- Impaired    Non-Dominant Hand Discontinued --- Impaired       Mood and Personality:      Raw Score Percentile   PROMIS Depression Questionnaire: 18 --- Mild  PROMIS Anxiety Questionnaire: 15 --- None to Slight       Additional Questionnaires:      Raw Score Percentile   PROMIS Sleep Disturbance Questionnaire: 24 --- None to Slight   Informed Consent and Coding/Compliance:   The current evaluation represents a clinical evaluation for the purposes previously outlined  by the referral source and is in no way reflective of a forensic evaluation.   Mr. Yost was provided with a verbal description of the nature and purpose of the present neuropsychological evaluation. Also reviewed were the foreseeable risks and/or discomforts and benefits of the procedure, limits of confidentiality, and mandatory reporting requirements of this provider. The patient was given the opportunity to ask questions and receive answers about the evaluation. Oral consent to participate was provided by the patient.   This evaluation was conducted by Christia Reading, Ph.D., licensed clinical neuropsychologist. Mr. Hurwitz completed a clinical interview with Dr. Melvyn Novas, billed as one unit [phone], and 125 minutes of cognitive testing and scoring, billed as one unit [phone] and three additional units [id number]. Psychometrist Milana Kidney, B.S., assisted Dr. Melvyn Novas with test administration and scoring procedures. As a separate and discrete service, Dr. Melvyn Novas spent a total of 180 minutes in interpretation and report writing billed as one unit [phone] and two units 96133.

## 2021-07-07 ENCOUNTER — Other Ambulatory Visit: Payer: Self-pay | Admitting: Adult Health

## 2021-07-07 DIAGNOSIS — F419 Anxiety disorder, unspecified: Secondary | ICD-10-CM

## 2021-07-07 DIAGNOSIS — F32A Depression, unspecified: Secondary | ICD-10-CM

## 2021-07-07 NOTE — Patient Instructions (Addendum)
DUE TO COVID-19 ONLY ONE VISITOR IS ALLOWED TO COME WITH YOU AND STAY IN THE WAITING ROOM ONLY DURING PRE OP AND PROCEDURE.   **NO VISITORS ARE ALLOWED IN THE SHORT STAY AREA OR RECOVERY ROOM!!**  IF YOU WILL BE ADMITTED INTO THE HOSPITAL YOU ARE ALLOWED ONLY TWO SUPPORT PEOPLE DURING VISITATION HOURS ONLY (10AM -8PM)   The support person(s) may change daily. The support person(s) must pass our screening, gel in and out, and wear a mask at all times, including in the patient's room. Patients must also wear a mask when staff or their support person are in the room.  No visitors under the age of 35. Any visitor under the age of 52 must be accompanied by an adult.    COVID SWAB TESTING MUST BE COMPLETED ON:  07/15/21 **MUST PRESENT COMPLETED FORM AT TESTING SITE**    [facility] (backside of the building) Open 8am-3pm. No appointment needed You are not required to quarantine, however you are required to wear a well-fitted mask when you are out and around people not in your household.  Hand Hygiene often Do NOT share personal items Notify your provider if you are in close contact with someone who has COVID or you develop fever 100.4 or greater, new onset of sneezing, cough, sore throat, shortness of breath or body aches.       Your procedure is scheduled on: 07/19/21   Report to [facility] Main  Entrance    Report to admitting at 7:00 AM   Call this number if you have problems the morning of surgery [phone]   Do not eat food :After Midnight.   May have liquids until 6:30 AM day of surgery  CLEAR LIQUID DIET  Foods Allowed                                                                     Foods Excluded  Water, Black Coffee and tea (No milk or creamer)           liquids that you cannot  Plain Jell-O in any flavor  (No red)                                    see through such as: Fruit ices (not with fruit pulp)                                            milk, soups, orange juice              Iced Popsicles (No red)                                               All solid food  Apple juices Sports drinks like Gatorade (No red) Lightly seasoned clear broth or consume(fat free) Sugar   Oral Hygiene is also important to reduce your risk of infection.                                    Remember - BRUSH YOUR TEETH THE MORNING OF SURGERY WITH YOUR REGULAR TOOTHPASTE   Take these medicines the morning of surgery with A SIP OF WATER: Tylenol, Lipitor, Keppra, Pantoprazole.                               You may not have any metal on your body including hair pins, jewelry, and body piercing             Do not wear lotions, powders, cologne, or deodorant              Men may shave face and neck.   Do not bring valuables to the hospital. [place].   Bring small overnight bag day of surgery.   Special Instructions: Bring a copy of your healthcare power of attorney and living will documents         the day of surgery if you haven't scanned them in before.   Please read over the following fact sheets you were given: IF YOU HAVE QUESTIONS ABOUT YOUR PRE OP INSTRUCTIONS PLEASE CALL [place] [facility] - Preparing for Surgery Before surgery, you can play an important role.  Because skin is not sterile, your skin needs to be as free of germs as possible.  You can reduce the number of germs on your skin by washing with CHG (chlorahexidine gluconate) soap before surgery.  CHG is an antiseptic cleaner which kills germs and bonds with the skin to continue killing germs even after washing. Please DO NOT use if you have an allergy to CHG or antibacterial soaps.  If your skin becomes reddened/irritated stop using the CHG and inform your nurse when you arrive at Short Stay. Do not shave (including legs and underarms) for at least 48 hours prior to the first CHG  shower.  You may shave your face/neck.  Please follow these instructions carefully:  1.  Shower with CHG Soap the night before surgery and the  morning of surgery.  2.  If you choose to wash your hair, wash your hair first as usual with your normal  shampoo.  3.  After you shampoo, rinse your hair and body thoroughly to remove the shampoo.                             4.  Use CHG as you would any other liquid soap.  You can apply chg directly to the skin and wash.  Gently with a scrungie or clean washcloth.  5.  Apply the CHG Soap to your body ONLY FROM THE NECK DOWN.   Do   not use on face/ open                           Wound or open sores. Avoid contact with eyes, ears mouth and   genitals (private parts).  Wash face,  Genitals (private parts) with your normal soap.             6.  Wash thoroughly, paying special attention to the area where your    surgery  will be performed.  7.  Thoroughly rinse your body with warm water from the neck down.  8.  DO NOT shower/wash with your normal soap after using and rinsing off the CHG Soap.                9.  Pat yourself dry with a clean towel.            10.  Wear clean pajamas.            11.  Place clean sheets on your bed the night of your first shower and do not  sleep with pets. Day of Surgery : Do not apply any lotions/deodorants the morning of surgery.  Please wear clean clothes to the hospital/surgery center.  FAILURE TO FOLLOW THESE INSTRUCTIONS MAY RESULT IN THE CANCELLATION OF YOUR SURGERY  PATIENT SIGNATURE_________________________________  NURSE SIGNATURE__________________________________  ________________________________________________________________________

## 2021-07-07 NOTE — Progress Notes (Signed)
Please place orders for PAT appointment scheduled 07/08/21.

## 2021-07-07 NOTE — Progress Notes (Addendum)
COVID swab appointment: 07/15/21  COVID Vaccine Completed: No Date COVID Vaccine completed: Has received booster: COVID vaccine manufacturer: [employer]   Date of COVID positive in last 90 days: No  PCP - Jenna Luo, MD Cardiologist -   Clearance by Jenna Luo 07/01/21 on chart  Chest x-ray - 04/30/21 Epic EKG - 05/03/21 Epic Stress Test - N/a ECHO - 04/14/18 Epic Cardiac Cath - N/a Pacemaker/ICD device last checked: N/a Spinal Cord Stimulator: N/a  Sleep Study - yes positive CPAP - yes, every night  Fasting Blood Sugar - N/a Checks Blood Sugar _____ times a day  Blood Thinner Instructions: Aspirin Instructions: ASA 81, stop 7 days prior to surgery Last Dose:  Activity level: Can go up a flight of stairs and perform activities of daily living without stopping and without symptoms of chest pain or shortness of breath.       Anesthesia review: HTN, OSA, dementia. Pt BP at PAT 172/102 and 160/180. Brought to [place], PA's attention. Patient was recently taken off all BP meds. Instructed patient and wife to check BP over the next few days, multiple times a day and to call PCP if BP is consistently higher than 140s/90s.   Patient denies shortness of breath, fever, cough and chest pain at PAT appointment   Patient verbalized understanding of instructions that were given to them at the PAT appointment. Patient was also instructed that they will need to review over the PAT instructions again at home before surgery.

## 2021-07-08 ENCOUNTER — Encounter (HOSPITAL_COMMUNITY): Payer: Self-pay

## 2021-07-08 ENCOUNTER — Encounter (HOSPITAL_COMMUNITY)
Admission: RE | Admit: 2021-07-08 | Discharge: 2021-07-08 | Disposition: A | Discharge status: Home / Self Care | Payer: Worker's Compensation | Source: Ambulatory Visit | Attending: Orthopedic Surgery | Admitting: Orthopedic Surgery

## 2021-07-08 ENCOUNTER — Other Ambulatory Visit: Payer: Self-pay

## 2021-07-08 DIAGNOSIS — Z8673 Personal history of transient ischemic attack (TIA), and cerebral infarction without residual deficits: Secondary | ICD-10-CM | POA: Insufficient documentation

## 2021-07-08 DIAGNOSIS — Z01812 Encounter for preprocedural laboratory examination: Secondary | ICD-10-CM | POA: Diagnosis present

## 2021-07-08 DIAGNOSIS — Z7901 Long term (current) use of anticoagulants: Secondary | ICD-10-CM | POA: Insufficient documentation

## 2021-07-08 HISTORY — DX: Cardiac murmur, unspecified: R01.1

## 2021-07-08 LAB — CBC
HCT: 46.9 % (ref 39.0–52.0)
Hemoglobin: 15.5 g/dL (ref 13.0–17.0)
MCH: 31.8 pg (ref 26.0–34.0)
MCHC: 33 g/dL (ref 30.0–36.0)
MCV: 96.1 fL (ref 80.0–100.0)
Platelets: 297 10*3/uL (ref 150–400)
RBC: 4.88 MIL/uL (ref 4.22–5.81)
RDW: 12.5 % (ref 11.5–15.5)
WBC: 7 10*3/uL (ref 4.0–10.5)
nRBC: 0 % (ref 0.0–0.2)

## 2021-07-08 LAB — BASIC METABOLIC PANEL
Anion gap: 7 (ref 5–15)
BUN: 17 mg/dL (ref 8–23)
CO2: 28 mmol/L (ref 22–32)
Calcium: 9.2 mg/dL (ref 8.9–10.3)
Chloride: 104 mmol/L (ref 98–111)
Creatinine, Ser: 1 mg/dL (ref 0.61–1.24)
GFR, Estimated: >60 mL/min (ref >60)
Glucose, Bld: 106 mg/dL — ABNORMAL HIGH (ref 70–99)
Potassium: 4.2 mmol/L (ref 3.5–5.1)
Sodium: 139 mmol/L (ref 135–145)

## 2021-07-08 LAB — SURGICAL PCR SCREEN
MRSA, PCR: NEGATIVE
Staphylococcus aureus: NEGATIVE

## 2021-07-14 NOTE — Progress Notes (Signed)
Anesthesia Chart Review   Case: [id number] Date/Time: 07/19/21 0910   Procedure: TOTAL KNEE ARTHROPLASTY (Left: Knee)   Anesthesia type: Choice   Pre-op diagnosis: Left knee osteoarthritis   Location: [facility] ROOM 09 / [facility]   Surgeons: Gaynelle Arabian, MD       DISCUSSION:65 y.o. never smoker with h/o HTN, dementia, stroke, OSA, left knee OA scheduled for above procedure 07/19/2021 with Dr. Gaynelle Arabian.   Clearance on chart from PCP which states that ps is moderate risk.  "Has dementia and at risk for post operative delirium."  This was discussed with family as well.  Per Dr. Dennard Schaumann no further testing needed prior to surgery.  VS: BP (!) 160/108   Pulse 61   Temp 36.8 C (Oral)   Resp 18   Ht 6' (1.829 m)   Wt 93.4 kg   SpO2 98%   BMI 27.91 kg/m   PROVIDERS: [name], MD is PCP    LABS: Labs reviewed: Acceptable for surgery. (all labs ordered are listed, but only abnormal results are displayed)  Labs Reviewed  BASIC METABOLIC PANEL - Abnormal; Notable for the following components:      Result Value   Glucose, Bld 106 (*)    All other components within normal limits  SURGICAL PCR SCREEN  CBC     IMAGES:   EKG: 05/03/2021 Rate 52 bpm  Sinus rhythm  Borderline prolonged PR interval  RBBB Baseline wander in leads V3   CV: Echo 04/14/2018 Study Conclusions   - Left ventricle: The cavity size was normal. There was mild    concentric hypertrophy. Systolic function was normal. The    estimated ejection fraction was in the range of 55% to 60%. Wall    motion was normal; there were no regional wall motion    abnormalities. Doppler parameters are consistent with abnormal    left ventricular relaxation (grade 1 diastolic dysfunction).  - Left atrium: The atrium was mildly dilated.  Past Medical History:  Diagnosis Date   Acid reflux 46/96/2952   Acute metabolic encephalopathy 84/13/2440   Anxiety    Cerebral embolism with cerebral infarction 04/15/2018    Chest pain 05/19/2015   EEG abnormality without seizure 05/19/2021   Encephalitis 04/30/2021   Encephalopathy due to COVID-19 virus    Episodic confusion 05/19/2021   Heart murmur    Hyperglycemia 05/19/2015   Hypertension    Left thalamic lacunar infarct 05/24/2018   OSA (obstructive sleep apnea)    cpap 9 cm H2O   Recurrent unilateral inguinal hernia with obstruction and without gangrene    SBO (small bowel obstruction)    Ulcerative colitis 09/24/2019   Vascular dementia without behavioral disturbance 05/19/2021    Past Surgical History:  Procedure Laterality Date   BIOPSY  06/21/2017   Procedure: BIOPSY;  Surgeon: Rogene Houston, MD;  Location: [facility] SU[facility];  Service: Endoscopy;;  rectal, righta and left colon;   COLONOSCOPY N/A 06/21/2017   Procedure: COLONOSCOPY;  Surgeon: Rogene Houston, MD;  Location: [facility];  Service: Endoscopy;  Laterality: N/A;  1:25   INGUINAL HERNIA REPAIR Right 04/04/2018   Procedure: HERNIA REPAIR INGUINAL ADULT WITH MESH;  Surgeon: Aviva Signs, MD;  Location: [facility];  Service: General;  Laterality: Right;   INGUINAL HERNIA REPAIR Right 01/31/2019   Procedure: RECURRENT RIGHT INGUINAL HERNIORRHAPY WITH MESH, INCARCERATED;  Surgeon: Aviva Signs, MD;  Location: [facility];  Service: General;  Laterality: Right;   KNEE SURGERY  tore mcl in high school (70's)    MEDICATIONS:  acetaminophen (TYLENOL) 500 MG tablet   aspirin EC 81 MG tablet   atorvastatin (LIPITOR) 40 MG tablet   diphenhydrAMINE (BENADRYL) 25 MG tablet   donepezil (ARICEPT) 5 MG tablet   escitalopram (LEXAPRO) 20 MG tablet   levETIRAcetam (KEPPRA) 500 MG tablet   Lido-Capsaicin-Men-Methyl Sal 0.5-0.035-5-20 % PTCH   Mesalamine (DELZICOL) 400 MG CPDR DR capsule   pantoprazole (PROTONIX) 40 MG tablet   vitamin B-12 (CYANOCOBALAMIN) 500 MCG tablet   vitamin C (ASCORBIC ACID) 500 MG tablet   No current facility-administered medications for this encounter.    Konrad Felix Ward, PA-C [facility] Pre-Surgical Testing [phone]

## 2021-07-15 ENCOUNTER — Encounter: Payer: Self-pay | Admitting: Family Medicine

## 2021-07-15 ENCOUNTER — Ambulatory Visit (INDEPENDENT_AMBULATORY_CARE_PROVIDER_SITE_OTHER): Payer: Medicare Other | Admitting: Family Medicine

## 2021-07-15 ENCOUNTER — Ambulatory Visit (INDEPENDENT_AMBULATORY_CARE_PROVIDER_SITE_OTHER): Payer: Medicare Other | Admitting: Psychology

## 2021-07-15 ENCOUNTER — Other Ambulatory Visit: Payer: Self-pay | Admitting: Orthopedic Surgery

## 2021-07-15 ENCOUNTER — Encounter: Payer: Self-pay | Admitting: Adult Health

## 2021-07-15 ENCOUNTER — Other Ambulatory Visit: Payer: Self-pay

## 2021-07-15 VITALS — BP 140/98 | HR 74 | Temp 98.5°F | Resp 14 | Ht 73.0 in | Wt 208.0 lb

## 2021-07-15 DIAGNOSIS — I6930 Unspecified sequelae of cerebral infarction: Secondary | ICD-10-CM

## 2021-07-15 DIAGNOSIS — G9341 Metabolic encephalopathy: Secondary | ICD-10-CM | POA: Diagnosis not present

## 2021-07-15 DIAGNOSIS — G0490 Encephalitis and encephalomyelitis, unspecified: Secondary | ICD-10-CM

## 2021-07-15 DIAGNOSIS — I10 Essential (primary) hypertension: Secondary | ICD-10-CM

## 2021-07-15 DIAGNOSIS — F0150 Vascular dementia without behavioral disturbance: Secondary | ICD-10-CM

## 2021-07-15 LAB — SARS CORONAVIRUS 2 (TAT 6-24 HRS): SARS Coronavirus 2: NEGATIVE

## 2021-07-15 NOTE — Progress Notes (Signed)
Subjective:    Patient ID: Justin [name], male    DOB: 1956/01/15, 65 y.o.   MRN: [id number]  HPI Patient's blood pressure has consistently been elevated over the last several weeks.  Blood pressures ranging between 140 and 165/80-90.  He denies any chest pain shortness of breath or dyspnea on exertion.  He is still taking Keppra although I am not certain why.  Patient does not have another follow-up with neurology until November.  He does have dementia which is to the point that he is no longer able to drive. Past Medical History:  Diagnosis Date   Acid reflux 60/63/0160   Acute metabolic encephalopathy 10/93/2355   Anxiety    Cerebral embolism with cerebral infarction 04/15/2018   Chest pain 05/19/2015   EEG abnormality without seizure 05/19/2021   Encephalitis 04/30/2021   Encephalopathy due to COVID-19 virus    Episodic confusion 05/19/2021   Heart murmur    Hyperglycemia 05/19/2015   Hypertension    Left thalamic lacunar infarct 05/24/2018   OSA (obstructive sleep apnea)    cpap 9 cm H2O   Recurrent unilateral inguinal hernia with obstruction and without gangrene    SBO (small bowel obstruction)    Ulcerative colitis 09/24/2019   Vascular dementia without behavioral disturbance 05/19/2021   Past Surgical History:  Procedure Laterality Date   BIOPSY  06/21/2017   Procedure: BIOPSY;  Surgeon: Rogene Houston, MD;  [facility]: [facility];  Service: Endoscopy;;  rectal, righta and left colon;   COLONOSCOPY N/A 06/21/2017   Procedure: COLONOSCOPY;  Surgeon: Rogene Houston, MD;  Location: [facility];  Service: Endoscopy;  Laterality: N/A;  1:25   INGUINAL HERNIA REPAIR Right 04/04/2018   Procedure: HERNIA REPAIR INGUINAL ADULT WITH MESH;  Surgeon: Aviva Signs, MD;  Location: [facility];  Service: General;  Laterality: Right;   INGUINAL HERNIA REPAIR Right 01/31/2019   Procedure: RECURRENT RIGHT INGUINAL HERNIORRHAPY WITH MESH, INCARCERATED;  Surgeon: Aviva Signs, MD;   Location: [facility];  Service: General;  Laterality: Right;   KNEE SURGERY     tore mcl in high school (70's)   Current Outpatient Medications on File Prior to Visit  Medication Sig Dispense Refill   aspirin EC 81 MG tablet Take 1 tablet (81 mg total) by mouth daily. Swallow whole. 150 tablet 2   atorvastatin (LIPITOR) 40 MG tablet TAKE ONE TABLET BY MOUTH AT 6PM 90 tablet 3   diphenhydrAMINE (BENADRYL) 25 MG tablet Take 25 mg by mouth daily as needed for allergies.     donepezil (ARICEPT) 5 MG tablet Take 1 tablet (5 mg total) by mouth at bedtime. 30 tablet 3   escitalopram (LEXAPRO) 20 MG tablet TAKE ONE TABLET (20MG TOTAL) BY MOUTH ATBEDTIME 90 tablet 0   levETIRAcetam (KEPPRA) 500 MG tablet Take 1 tablet (500 mg total) by mouth 2 (two) times daily. 60 tablet 5   Mesalamine (DELZICOL) 400 MG CPDR DR capsule Take 2 capsules (800 mg total) by mouth 2 (two) times daily. 360 capsule 3   pantoprazole (PROTONIX) 40 MG tablet TAKE 1 TABLET BY MOUTH EVERY DAY 30 tablet 2   vitamin B-12 (CYANOCOBALAMIN) 500 MCG tablet Take 1 tablet (500 mcg total) by mouth daily. 30 tablet 1   vitamin C (ASCORBIC ACID) 500 MG tablet Take 500 mg by mouth daily.     acetaminophen (TYLENOL) 500 MG tablet Take 500 mg by mouth every 6 (six) hours as needed (pain). (Patient not taking: Reported on 07/15/2021)  No current facility-administered medications on file prior to visit.   No Known Allergies Social History   Socioeconomic History   Marital status: Married    Spouse name: Justin [name]   Number of children: Not on file   Years of education: 12   Highest education level: High school graduate  Occupational History   Occupation: Unemployed/retired    Comment: Clinical biochemist  Tobacco Use   Smoking status: Never   Smokeless tobacco: Never  Vaping Use   Vaping Use: Never used  Substance and Sexual Activity   Alcohol use: No    Alcohol/week: 0.0 standard drinks   Drug use: No   Sexual activity: Yes  Other Topics  Concern   Not on file  Social History Narrative   Lives with wife   Social Determinants of Health   Financial Resource Strain: Not on file  Food Insecurity: Not on file  Transportation Needs: Not on file  Physical Activity: Not on file  Stress: Not on file  Social Connections: Not on file  Intimate Partner Violence: Not on file    Review of Systems     Objective:   Physical Exam Vitals reviewed.  Constitutional:      Appearance: Normal appearance. He is normal weight.  Cardiovascular:     Rate and Rhythm: Normal rate and regular rhythm.     Heart sounds: Normal heart sounds. No murmur heard.   No friction rub. No gallop.  Pulmonary:     Effort: Pulmonary effort is normal. No respiratory distress.     Breath sounds: Normal breath sounds. No stridor.  Abdominal:     General: Bowel sounds are normal. There is no distension.     Palpations: Abdomen is soft.     Tenderness: There is no abdominal tenderness. There is no guarding.  Musculoskeletal:     Right lower leg: No edema.     Left lower leg: No edema.  Skin:    Findings: No erythema or rash.  Neurological:     General: No focal deficit present.     Mental Status: He is alert and oriented to person, place, and time.     Cranial Nerves: No cranial nerve deficit.     Motor: No weakness.     Gait: Gait normal.        Assessment & Plan:  Benign essential HTN Now that the patient is no longer driving and working, he is not as dehydrated and his blood pressures been going up.  Therefore we will resume losartan 100 mg a day and recheck blood pressure in 1 to 2 weeks.  Also recommended weaning down on Keppra to 250 mg twice daily.  If he tolerates this well for a few weeks will decrease to 250 mg daily and then discontinue the medication altogether in an effort to limit polypharmacy as he has no history of seizure activity

## 2021-07-16 NOTE — Progress Notes (Signed)
   Neuropsychology Feedback Session Justin [name]. [facility] Department of Neurology  Reason for Referral:   Justin [name] is a 65 y.o. right-handed Caucasian male referred by  Frann Rider, NP , to characterize his current cognitive functioning and assist with diagnostic clarity and treatment planning in the context of prior left thalamic lacunar infarct and suspected ongoing cognitive decline.   Feedback:   Justin [name] completed a comprehensive neuropsychological evaluation on 07/06/2021. Please refer to that encounter for the full report and recommendations. Briefly, results suggested fairly diffuse cognitive impairment with scores generally falling in exceptionally low ranges relative to age-matched peers. His lone strengths were seen across basic attention and confrontation naming. However, all other domains were notably impaired. Regarding etiology, there is a strong likelihood for a significant vascular component to ongoing cognitive decline (i.e., vascular dementia presentation). His medical history is noteworthy for hypertension and hyperglycemia, as well as a prior left thalamic lacunar infarct. Specific to the latter, cognitive deficits surrounding processing speed, attention/concentration, executive functioning, and learning and memory are commonly seen in individuals with a prior stroke history.  Additionally, a history of metabolic encephalopathy is commonly associated with cognitive dysfunction surrounding processing speed, attention/concentration, and executive functioning. Furthermore, a history of encephalitis (as suspected but seemingly not confirmed based upon medical records) would also yield cognitive dysfunction in similar domains. Taken together, it would seem that a vascular dementia presentation is most appropriate, likely exacerbated by his history of COVID-19 and acute metabolic encephalopathy due to suspected encephalitis.   Justin [name] was accompanied by his wife  during the current feedback session. Content of the current session focused on the results of his neuropsychological evaluation. Justin [name] was given the opportunity to ask questions and his questions were answered. He was encouraged to reach out should additional questions arise. A copy of his report was provided at the conclusion of the visit.      40 minutes were spent conducting the current feedback session with Justin [name], billed as one unit [phone].

## 2021-07-18 NOTE — H&P (Signed)
TOTAL KNEE ADMISSION H&P  Patient is being admitted for left total knee arthroplasty.  Subjective:  Chief Complaint: Left knee pain.  HPI: Justin [name], 65 y.o. male has a history of pain and functional disability in the left knee due to arthritis and has failed non-surgical conservative treatments for greater than 12 weeks to include NSAID's and/or analgesics and activity modification. Onset of symptoms was gradual, starting  several  years ago with gradually worsening course since that time. Patient currently rates pain in the left knee at 7 out of 10 with activity. Patient has worsening of pain with activity and weight bearing, pain with passive range of motion, and crepitus. Patient has evidence of periarticular osteophytes and joint space narrowing by imaging studies. There is no active infection.  Patient Active Problem List   Diagnosis Date Noted   Vascular dementia without behavioral disturbance 05/19/2021   Encephalopathy due to COVID-19 virus 05/19/2021   Abnormal brain MRI 05/19/2021   EEG abnormality without seizure 05/19/2021   Episodic confusion 54/56/2563   Acute metabolic encephalopathy 89/37/3428   Encephalitis 04/30/2021   BMI 27.0-27.9,adult 04/30/2021   Acid reflux 04/27/2021   Ulcerative colitis 09/24/2019   Incarcerated right inguinal hernia 01/30/2019   SBO (small bowel obstruction)    Left thalamic lacunar infarct 05/24/2018   Cerebral embolism with cerebral infarction 04/15/2018   Right facial numbness 04/13/2018   Recurrent unilateral inguinal hernia with obstruction and without gangrene    OSA (obstructive sleep apnea) 03/13/2018   Hypersomnia with sleep apnea 12/15/2017   Chest pain 05/19/2015   Anxiety 05/19/2015   Hyperglycemia 05/19/2015   Hypertension     Past Medical History:  Diagnosis Date   Acid reflux 76/81/1572   Acute metabolic encephalopathy 62/01/5596   Anxiety    Cerebral embolism with cerebral infarction 04/15/2018   Chest pain  05/19/2015   EEG abnormality without seizure 05/19/2021   Encephalitis 04/30/2021   Encephalopathy due to COVID-19 virus    Episodic confusion 05/19/2021   Heart murmur    Hyperglycemia 05/19/2015   Hypertension    Left thalamic lacunar infarct 05/24/2018   OSA (obstructive sleep apnea)    cpap 9 cm H2O   Recurrent unilateral inguinal hernia with obstruction and without gangrene    SBO (small bowel obstruction)    Ulcerative colitis 09/24/2019   Vascular dementia without behavioral disturbance 05/19/2021    Past Surgical History:  Procedure Laterality Date   BIOPSY  06/21/2017   Procedure: BIOPSY;  Surgeon: Rogene Houston, MD;  [facility];  Service: Endoscopy;;  rectal, righta and left colon;   COLONOSCOPY N/A 06/21/2017   Procedure: COLONOSCOPY;  Surgeon: Rogene Houston, MD;  Location: [facility];  Service: Endoscopy;  Laterality: N/A;  1:25   INGUINAL HERNIA REPAIR Right 04/04/2018   Procedure: HERNIA REPAIR INGUINAL ADULT WITH MESH;  Surgeon: Aviva Signs, MD;  Location: [facility];  Service: General;  Laterality: Right;   INGUINAL HERNIA REPAIR Right 01/31/2019   Procedure: RECURRENT RIGHT INGUINAL HERNIORRHAPY WITH MESH, INCARCERATED;  Surgeon: Aviva Signs, MD;  Location: [facility];  Service: General;  Laterality: Right;   KNEE SURGERY     tore mcl in high school (70's)    Prior to Admission medications   Medication Sig Start Date End Date Taking? Authorizing Provider  acetaminophen (TYLENOL) 500 MG tablet Take 500 mg by mouth every 6 (six) hours as needed (pain). Patient not taking: Reported on 07/15/2021   Yes [provider]  aspirin EC 81 MG tablet Take 1 tablet (81 mg total) by mouth daily. Swallow whole. 05/05/21 05/05/22 Yes Elgergawy, Silver Huguenin, MD  atorvastatin (LIPITOR) 40 MG tablet TAKE ONE TABLET BY MOUTH AT 6PM 06/15/21  Yes [name], MD  diphenhydrAMINE (BENADRYL) 25 MG tablet Take 25 mg by mouth daily as needed for allergies.   Yes  [provider]  donepezil (ARICEPT) 5 MG tablet Take 1 tablet (5 mg total) by mouth at bedtime. 05/19/21  Yes Dohmeier, Asencion Partridge, MD  levETIRAcetam (KEPPRA) 500 MG tablet Take 1 tablet (500 mg total) by mouth 2 (two) times daily. 05/19/21  Yes Dohmeier, Asencion Partridge, MD  Mesalamine (DELZICOL) 400 MG CPDR DR capsule Take 2 capsules (800 mg total) by mouth 2 (two) times daily. 09/29/20  Yes Laurine Blazer B, PA-C  pantoprazole (PROTONIX) 40 MG tablet TAKE 1 TABLET BY MOUTH EVERY DAY 06/15/21  Yes Rehman, Mechele Dawley, MD  vitamin B-12 (CYANOCOBALAMIN) 500 MCG tablet Take 1 tablet (500 mcg total) by mouth daily. 05/05/21  Yes Elgergawy, Silver Huguenin, MD  vitamin C (ASCORBIC ACID) 500 MG tablet Take 500 mg by mouth daily.   Yes [provider]  escitalopram (LEXAPRO) 20 MG tablet TAKE ONE TABLET (20MG TOTAL) BY MOUTH ATBEDTIME 07/08/21   Dohmeier, Asencion Partridge, MD    No Known Allergies  Social History   Socioeconomic History   Marital status: Married    Spouse name: [name]   Number of children: Not on file   Years of education: 12   Highest education level: High school graduate  Occupational History   Occupation: Unemployed/retired    Comment: Clinical biochemist  Tobacco Use   Smoking status: Never   Smokeless tobacco: Never  Vaping Use   Vaping Use: Never used  Substance and Sexual Activity   Alcohol use: No    Alcohol/week: 0.0 standard drinks   Drug use: No   Sexual activity: Yes  Other Topics Concern   Not on file  Social History Narrative   Lives with wife   Social Determinants of Health   Financial Resource Strain: Not on file  Food Insecurity: Not on file  Transportation Needs: Not on file  Physical Activity: Not on file  Stress: Not on file  Social Connections: Not on file  Intimate Partner Violence: Not on file    Tobacco Use: Low Risk    Smoking Tobacco Use: Never   Smokeless Tobacco Use: Never   Social History   Substance and Sexual Activity  Alcohol Use No    Alcohol/week: 0.0 standard drinks    Family History  Problem Relation Age of Onset   Tuberculosis Mother    Stroke Mother    Heart disease Father        died at 39   Bradycardia Brother        pacemaker   Stroke Brother    Mental illness Paternal Grandfather        suicide at 56   Suicidality Paternal Grandfather    Cancer Paternal Uncle    Dementia Maternal Uncle 93   Colon cancer Neg Hx     ROS: Constitutional: no fever, no chills, no night sweats, no significant weight loss Cardiovascular: no chest pain, no palpitations Respiratory: no cough, no shortness of breath, No COPD Gastrointestinal: no vomiting, no nausea Musculoskeletal: no swelling in Joints, Joint Pain Neurologic: no numbness, no tingling, no difficulty with balance   Objective:  Physical Exam: Well nourished and well developed.  General: Alert and oriented  x3, cooperative and pleasant, no acute distress.  Head: normocephalic, atraumatic, neck supple.  Eyes: EOMI.  Respiratory: breath sounds clear in all fields, no wheezing, rales, or rhonchi. Cardiovascular: Regular rate and rhythm, no murmurs, gallops or rubs.  Abdomen: non-tender to palpation and soft, normoactive bowel sounds. Musculoskeletal:   Left Hip Exam:   The range of motion: normal without discomfort.     Left Knee Exam:   No effusion present. No swelling present.   The range of motion is: 0 to 130 degrees.   Marked crepitus on range of motion of the knee.   Slight medial joint line tenderness.   No lateral joint line tenderness.   The knee is stable.   Calves soft and nontender. Motor function intact in LE. Strength 5/5 LE bilaterally. Neuro: Distal pulses 2+. Sensation to light touch intact in LE.     Vital signs in last 24 hours:    Imaging Review  Radiographs- AP and lateral of the left knee dated 02/04/2021 demonstrate severe bone-on-bone arthritis in the left knee, tricompartmental in nature, with significant lateral  tibial subluxation and large osteophyte formation. Procedure Documentation  Assessment/Plan:  End stage arthritis, left knee   The patient history, physical examination, clinical judgment of the provider and imaging studies are consistent with end stage degenerative joint disease of the left knee and total knee arthroplasty is deemed medically necessary. The treatment options including medical management, injection therapy arthroscopy and arthroplasty were discussed at length. The risks and benefits of total knee arthroplasty were presented and reviewed. The risks due to aseptic loosening, infection, stiffness, patella tracking problems, thromboembolic complications and other imponderables were discussed. The patient acknowledged the explanation, agreed to proceed with the plan and consent was signed. Patient is being admitted for inpatient treatment for surgery, pain control, PT, OT, prophylactic antibiotics, VTE prophylaxis, progressive ambulation and ADLs and discharge planning. The patient is planning to be discharged  home .   Patient's anticipated LOS is less than 2 midnights, meeting these requirements: - Younger than 80 - Lives within 1 hour of care - Has a competent adult at home to recover with post-op - NO history of  - Chronic pain requiring opioids  - Diabetes  - Coronary Artery Disease  - Heart failure  - Heart attack  - Stroke  - DVT/VTE  - Cardiac arrhythmia  - Respiratory Failure/COPD  - Renal failure  - Anemia  - Advanced Liver disease   Therapy Plans: [facility] Disposition: Home with Wife Planned DVT Prophylaxis: Xarelto 5m  DME Needed: Ice Machine, WEnvironmental consultant 3-in-1 PCP: WJenna Luo MD - Received - Stated patient at moderate risk due to Dementia. Recommended stopping Aspirin 7 days prior to surgery.  Neurologist: PAntony Contras MD (clearance received) TXA: Topical Allergies: NKDA Anesthesia Concerns: Neurologist voicing concerns regarding anesthesia due  to signs of dementia  BMI: 27.3 Last HgbA1c: N/A  [facility] [facility] [place] - Patient was instructed on what medications to stop prior to surgery. - Follow-up visit in 2 weeks with Dr. AWynelle Link- Begin physical therapy following surgery - Pre-operative lab work as pre-surgical testing - Prescriptions will be provided in hospital at time of discharge  SFenton Foy [facility] PA-C Orthopedic Surgery [facility] [place]

## 2021-07-19 ENCOUNTER — Other Ambulatory Visit: Payer: Self-pay

## 2021-07-19 ENCOUNTER — Ambulatory Visit (HOSPITAL_COMMUNITY): Payer: Worker's Compensation | Admitting: Physician Assistant

## 2021-07-19 ENCOUNTER — Ambulatory Visit (HOSPITAL_COMMUNITY)
Admission: RE | Admit: 2021-07-19 | Discharge: 2021-07-20 | Disposition: A | Discharge status: Home / Self Care | Payer: Worker's Compensation | Source: Ambulatory Visit | Attending: Orthopedic Surgery | Admitting: Orthopedic Surgery

## 2021-07-19 ENCOUNTER — Ambulatory Visit (HOSPITAL_COMMUNITY): Payer: Worker's Compensation | Admitting: Certified Registered"

## 2021-07-19 ENCOUNTER — Encounter (HOSPITAL_COMMUNITY): Payer: Self-pay | Admitting: Orthopedic Surgery

## 2021-07-19 ENCOUNTER — Encounter: Payer: Medicare Other | Admitting: Counselor

## 2021-07-19 ENCOUNTER — Encounter (HOSPITAL_COMMUNITY)
Admission: RE | Disposition: A | Discharge status: Home / Self Care | Payer: Self-pay | Source: Ambulatory Visit | Attending: Orthopedic Surgery

## 2021-07-19 DIAGNOSIS — Z79899 Other long term (current) drug therapy: Secondary | ICD-10-CM | POA: Insufficient documentation

## 2021-07-19 DIAGNOSIS — M179 Osteoarthritis of knee, unspecified: Secondary | ICD-10-CM | POA: Diagnosis present

## 2021-07-19 DIAGNOSIS — F0150 Vascular dementia without behavioral disturbance: Secondary | ICD-10-CM | POA: Diagnosis not present

## 2021-07-19 DIAGNOSIS — M1710 Unilateral primary osteoarthritis, unspecified knee: Secondary | ICD-10-CM | POA: Diagnosis present

## 2021-07-19 DIAGNOSIS — Z8616 Personal history of COVID-19: Secondary | ICD-10-CM | POA: Insufficient documentation

## 2021-07-19 DIAGNOSIS — Z7982 Long term (current) use of aspirin: Secondary | ICD-10-CM | POA: Diagnosis not present

## 2021-07-19 DIAGNOSIS — Z8673 Personal history of transient ischemic attack (TIA), and cerebral infarction without residual deficits: Secondary | ICD-10-CM | POA: Diagnosis not present

## 2021-07-19 DIAGNOSIS — M1712 Unilateral primary osteoarthritis, left knee: Secondary | ICD-10-CM | POA: Insufficient documentation

## 2021-07-19 HISTORY — PX: TOTAL KNEE ARTHROPLASTY: SHX125

## 2021-07-19 SURGERY — ARTHROPLASTY, KNEE, TOTAL
Anesthesia: Monitor Anesthesia Care | Site: Knee | Laterality: Left

## 2021-07-19 MED ORDER — BUPIVACAINE LIPOSOME 1.3 % IJ SUSP
INTRAMUSCULAR | Status: AC
  Filled 2021-07-19: qty 20

## 2021-07-19 MED ORDER — DEXAMETHASONE SODIUM PHOSPHATE 10 MG/ML IJ SOLN
INTRAMUSCULAR | Status: AC
  Filled 2021-07-19: qty 1

## 2021-07-19 MED ORDER — PHENOL 1.4 % MT LIQD: 1.0000 | OROMUCOSAL | Status: DC | PRN

## 2021-07-19 MED ORDER — PROMETHAZINE HCL 25 MG/ML IJ SOLN: 6.2500 mg | INTRAMUSCULAR | Status: DC | PRN

## 2021-07-19 MED ORDER — ACETAMINOPHEN 500 MG PO TABS
1000.0000 mg | ORAL_TABLET | Freq: Four times a day (QID) | ORAL | Status: DC
  Administered 2021-07-19 – 2021-07-20 (×3): 1000 mg via ORAL
  Filled 2021-07-19 (×3): qty 2

## 2021-07-19 MED ORDER — SODIUM CHLORIDE (PF) 0.9 % IJ SOLN
INTRAMUSCULAR | Status: DC | PRN
  Administered 2021-07-19: 60 mL

## 2021-07-19 MED ORDER — ROPIVACAINE HCL 5 MG/ML IJ SOLN
INTRAMUSCULAR | Status: DC | PRN
  Administered 2021-07-19: 30 mL via PERINEURAL

## 2021-07-19 MED ORDER — TRANEXAMIC ACID 1000 MG/10ML IV SOLN
INTRAVENOUS | Status: DC | PRN
  Administered 2021-07-19: 2000 mg via TOPICAL

## 2021-07-19 MED ORDER — BUPIVACAINE LIPOSOME 1.3 % IJ SUSP
INTRAMUSCULAR | Status: DC | PRN
  Administered 2021-07-19: 20 mL

## 2021-07-19 MED ORDER — TRANEXAMIC ACID-NACL 1000-0.7 MG/100ML-% IV SOLN
INTRAVENOUS | Status: AC
  Filled 2021-07-19: qty 100

## 2021-07-19 MED ORDER — PROPOFOL 10 MG/ML IV BOLUS
INTRAVENOUS | Status: DC | PRN
  Administered 2021-07-19: 40 mg via INTRAVENOUS

## 2021-07-19 MED ORDER — ASPIRIN EC 81 MG PO TBEC: 81.0000 mg | DELAYED_RELEASE_TABLET | Freq: Every day | ORAL | Status: DC

## 2021-07-19 MED ORDER — DEXAMETHASONE SODIUM PHOSPHATE 10 MG/ML IJ SOLN
10.0000 mg | Freq: Once | INTRAMUSCULAR | Status: AC
  Administered 2021-07-20: 10 mg via INTRAVENOUS
  Filled 2021-07-19: qty 1

## 2021-07-19 MED ORDER — CHLORHEXIDINE GLUCONATE 0.12 % MT SOLN
15.0000 mL | Freq: Once | OROMUCOSAL | Status: AC
  Administered 2021-07-19: 15 mL via OROMUCOSAL

## 2021-07-19 MED ORDER — OXYCODONE HCL 5 MG PO TABS: 5.0000 mg | ORAL_TABLET | ORAL | Status: DC | PRN

## 2021-07-19 MED ORDER — BUPIVACAINE IN DEXTROSE 0.75-8.25 % IT SOLN
INTRATHECAL | Status: DC | PRN
  Administered 2021-07-19: 1.8 mL via INTRATHECAL

## 2021-07-19 MED ORDER — MESALAMINE 400 MG PO CPDR
800.0000 mg | DELAYED_RELEASE_CAPSULE | Freq: Two times a day (BID) | ORAL | Status: DC
Start: 2021-07-19 — End: 2021-07-20
  Administered 2021-07-19 – 2021-07-20 (×2): 800 mg via ORAL
  Filled 2021-07-19 (×2): qty 2

## 2021-07-19 MED ORDER — POLYETHYLENE GLYCOL 3350 17 G PO PACK: 17.0000 g | PACK | Freq: Every day | ORAL | Status: DC | PRN

## 2021-07-19 MED ORDER — ONDANSETRON HCL 4 MG/2ML IJ SOLN
INTRAMUSCULAR | Status: AC
  Filled 2021-07-19: qty 2

## 2021-07-19 MED ORDER — MENTHOL 3 MG MT LOZG: 1.0000 | LOZENGE | OROMUCOSAL | Status: DC | PRN

## 2021-07-19 MED ORDER — SODIUM CHLORIDE 0.9 % IR SOLN
Status: DC | PRN
  Administered 2021-07-19: 1000 mL

## 2021-07-19 MED ORDER — EPHEDRINE 5 MG/ML INJ
INTRAVENOUS | Status: AC
  Filled 2021-07-19: qty 5

## 2021-07-19 MED ORDER — DEXAMETHASONE SODIUM PHOSPHATE 10 MG/ML IJ SOLN
INTRAMUSCULAR | Status: DC | PRN
  Administered 2021-07-19: 8 mg
  Administered 2021-07-19: 5 mg

## 2021-07-19 MED ORDER — STERILE WATER FOR IRRIGATION IR SOLN
Status: DC | PRN
  Administered 2021-07-19: 2000 mL

## 2021-07-19 MED ORDER — ONDANSETRON HCL 4 MG PO TABS: 4.0000 mg | ORAL_TABLET | Freq: Four times a day (QID) | ORAL | Status: DC | PRN

## 2021-07-19 MED ORDER — ONDANSETRON HCL 4 MG/2ML IJ SOLN: 4.0000 mg | Freq: Four times a day (QID) | INTRAMUSCULAR | Status: DC | PRN

## 2021-07-19 MED ORDER — METHOCARBAMOL 500 MG PO TABS
500.0000 mg | ORAL_TABLET | Freq: Four times a day (QID) | ORAL | Status: DC | PRN
  Administered 2021-07-20 (×2): 500 mg via ORAL
  Filled 2021-07-19 (×2): qty 1

## 2021-07-19 MED ORDER — ORAL CARE MOUTH RINSE: 15.0000 mL | Freq: Once | OROMUCOSAL | Status: AC

## 2021-07-19 MED ORDER — MIDAZOLAM HCL 2 MG/2ML IJ SOLN
1.0000 mg | INTRAMUSCULAR | Status: DC
  Filled 2021-07-19: qty 2

## 2021-07-19 MED ORDER — ACETAMINOPHEN 10 MG/ML IV SOLN: 1000.0000 mg | Freq: Once | INTRAVENOUS | Status: AC

## 2021-07-19 MED ORDER — PROPOFOL 10 MG/ML IV BOLUS
INTRAVENOUS | Status: AC
  Filled 2021-07-19: qty 20

## 2021-07-19 MED ORDER — DONEPEZIL HCL 10 MG PO TABS
5.0000 mg | ORAL_TABLET | Freq: Every day | ORAL | Status: DC
  Administered 2021-07-19: 5 mg via ORAL
  Filled 2021-07-19: qty 1

## 2021-07-19 MED ORDER — METHOCARBAMOL 1000 MG/10ML IJ SOLN
500.0000 mg | Freq: Four times a day (QID) | INTRAVENOUS | Status: DC | PRN
  Administered 2021-07-19: 500 mg via INTRAVENOUS
  Filled 2021-07-19: qty 5
  Filled 2021-07-19: qty 500

## 2021-07-19 MED ORDER — ONDANSETRON HCL 4 MG/2ML IJ SOLN
INTRAMUSCULAR | Status: DC | PRN
Start: 2021-07-19 — End: 2021-07-19
  Administered 2021-07-19: 4 mg via INTRAVENOUS

## 2021-07-19 MED ORDER — PANTOPRAZOLE SODIUM 40 MG PO TBEC
40.0000 mg | DELAYED_RELEASE_TABLET | Freq: Every day | ORAL | Status: DC
  Administered 2021-07-19: 40 mg via ORAL
  Filled 2021-07-19: qty 1

## 2021-07-19 MED ORDER — METOCLOPRAMIDE HCL 5 MG/ML IJ SOLN: 5.0000 mg | Freq: Three times a day (TID) | INTRAMUSCULAR | Status: DC | PRN

## 2021-07-19 MED ORDER — LIDOCAINE 2% (20 MG/ML) 5 ML SYRINGE
INTRAMUSCULAR | Status: AC
  Filled 2021-07-19: qty 5

## 2021-07-19 MED ORDER — TRANEXAMIC ACID 1000 MG/10ML IV SOLN
2000.0000 mg | Freq: Once | INTRAVENOUS | Status: DC
  Filled 2021-07-19: qty 20

## 2021-07-19 MED ORDER — FLEET ENEMA 7-19 GM/118ML RE ENEM: 1.0000 | ENEMA | Freq: Once | RECTAL | Status: DC | PRN

## 2021-07-19 MED ORDER — ATORVASTATIN CALCIUM 40 MG PO TABS
40.0000 mg | ORAL_TABLET | Freq: Every day | ORAL | Status: DC
  Administered 2021-07-19: 40 mg via ORAL
  Filled 2021-07-19: qty 1

## 2021-07-19 MED ORDER — DOCUSATE SODIUM 100 MG PO CAPS
100.0000 mg | ORAL_CAPSULE | Freq: Two times a day (BID) | ORAL | Status: DC
  Administered 2021-07-19 – 2021-07-20 (×3): 100 mg via ORAL
  Filled 2021-07-19 (×2): qty 1

## 2021-07-19 MED ORDER — DIPHENHYDRAMINE HCL 12.5 MG/5ML PO ELIX: 12.5000 mg | ORAL_SOLUTION | ORAL | Status: DC | PRN

## 2021-07-19 MED ORDER — FENTANYL CITRATE (PF) 100 MCG/2ML IJ SOLN
INTRAMUSCULAR | Status: AC
  Filled 2021-07-19: qty 2

## 2021-07-19 MED ORDER — SODIUM CHLORIDE 0.9 % IV SOLN: INTRAVENOUS | Status: DC

## 2021-07-19 MED ORDER — LEVETIRACETAM 500 MG PO TABS
500.0000 mg | ORAL_TABLET | Freq: Two times a day (BID) | ORAL | Status: DC
  Administered 2021-07-19 – 2021-07-20 (×2): 500 mg via ORAL
  Filled 2021-07-19 (×2): qty 1

## 2021-07-19 MED ORDER — RIVAROXABAN 10 MG PO TABS
10.0000 mg | ORAL_TABLET | Freq: Every day | ORAL | Status: DC
  Administered 2021-07-20: 10 mg via ORAL
  Filled 2021-07-19: qty 1

## 2021-07-19 MED ORDER — CEFAZOLIN (ANCEF) 1 G IV SOLR
2.0000 g | INTRAVENOUS | Status: AC
  Administered 2021-07-19: 2 g
  Filled 2021-07-19: qty 2

## 2021-07-19 MED ORDER — LACTATED RINGERS IV SOLN: INTRAVENOUS | Status: DC

## 2021-07-19 MED ORDER — CEFAZOLIN SODIUM-DEXTROSE 2-4 GM/100ML-% IV SOLN
2.0000 g | Freq: Four times a day (QID) | INTRAVENOUS | Status: AC
  Administered 2021-07-19 (×2): 2 g via INTRAVENOUS
  Filled 2021-07-19 (×2): qty 100

## 2021-07-19 MED ORDER — EPHEDRINE SULFATE-NACL 50-0.9 MG/10ML-% IV SOSY
PREFILLED_SYRINGE | INTRAVENOUS | Status: DC | PRN
  Administered 2021-07-19 (×2): 5 mg via INTRAVENOUS
  Administered 2021-07-19: 10 mg via INTRAVENOUS
  Administered 2021-07-19: 5 mg via INTRAVENOUS

## 2021-07-19 MED ORDER — OXYCODONE HCL 5 MG PO TABS
10.0000 mg | ORAL_TABLET | ORAL | Status: DC | PRN
  Administered 2021-07-19 – 2021-07-20 (×4): 10 mg via ORAL
  Filled 2021-07-19 (×4): qty 2

## 2021-07-19 MED ORDER — HYDROMORPHONE HCL 1 MG/ML IJ SOLN: 0.5000 mg | INTRAMUSCULAR | Status: DC | PRN

## 2021-07-19 MED ORDER — BISACODYL 10 MG RE SUPP: 10.0000 mg | Freq: Every day | RECTAL | Status: DC | PRN

## 2021-07-19 MED ORDER — CEFAZOLIN SODIUM-DEXTROSE 2-4 GM/100ML-% IV SOLN
INTRAVENOUS | Status: AC
  Filled 2021-07-19: qty 100

## 2021-07-19 MED ORDER — ACETAMINOPHEN 10 MG/ML IV SOLN
1000.0000 mg | Freq: Once | INTRAVENOUS | Status: DC | PRN
Start: 2021-07-19 — End: 2021-07-19

## 2021-07-19 MED ORDER — PROPOFOL 500 MG/50ML IV EMUL
INTRAVENOUS | Status: DC | PRN
  Administered 2021-07-19: 40 ug/kg/min via INTRAVENOUS

## 2021-07-19 MED ORDER — FENTANYL CITRATE (PF) 100 MCG/2ML IJ SOLN
INTRAMUSCULAR | Status: DC | PRN
  Administered 2021-07-19: 50 ug via INTRAVENOUS

## 2021-07-19 MED ORDER — FENTANYL CITRATE PF 50 MCG/ML IJ SOSY
50.0000 ug | PREFILLED_SYRINGE | INTRAMUSCULAR | Status: DC
  Administered 2021-07-19: 50 ug via INTRAVENOUS
  Filled 2021-07-19: qty 2

## 2021-07-19 MED ORDER — FENTANYL CITRATE PF 50 MCG/ML IJ SOSY: 25.0000 ug | PREFILLED_SYRINGE | INTRAMUSCULAR | Status: DC | PRN

## 2021-07-19 MED ORDER — ESCITALOPRAM OXALATE 20 MG PO TABS
20.0000 mg | ORAL_TABLET | Freq: Every day | ORAL | Status: DC
  Administered 2021-07-19: 20 mg via ORAL
  Filled 2021-07-19: qty 1

## 2021-07-19 MED ORDER — 0.9 % SODIUM CHLORIDE (POUR BTL) OPTIME
TOPICAL | Status: DC | PRN
  Administered 2021-07-19: 1000 mL

## 2021-07-19 MED ORDER — ACETAMINOPHEN 10 MG/ML IV SOLN
INTRAVENOUS | Status: AC
  Filled 2021-07-19: qty 100

## 2021-07-19 MED ORDER — METOCLOPRAMIDE HCL 5 MG PO TABS: 5.0000 mg | ORAL_TABLET | Freq: Three times a day (TID) | ORAL | Status: DC | PRN

## 2021-07-19 SURGICAL SUPPLY — 56 items
ATTUNE MED DOME PAT 41 KNEE (Knees) ×1 IMPLANT
ATTUNE PS FEM LT SZ 7 CEM KNEE (Femur) ×1 IMPLANT
ATTUNE PSRP INSR SZ7 10 KNEE (Insert) ×1 IMPLANT
BAG COUNTER SPONGE SURGICOUNT (BAG) IMPLANT
BAG SPEC THK2 15X12 ZIP CLS (MISCELLANEOUS) ×1
BAG SPNG CNTER NS LX DISP (BAG)
BAG ZIPLOCK 12X15 (MISCELLANEOUS) ×2 IMPLANT
BASE TIBIAL ROT PLAT SZ 8 KNEE (Knees) IMPLANT
BLADE SAG 18X100X1.27 (BLADE) ×2 IMPLANT
BLADE SAW SGTL 11.0X1.19X90.0M (BLADE) ×2 IMPLANT
BNDG ELASTIC 6X5.8 VLCR STR LF (GAUZE/BANDAGES/DRESSINGS) ×2 IMPLANT
BOWL SMART MIX CTS (DISPOSABLE) ×2 IMPLANT
BSPLAT TIB 8 CMNT ROT PLAT STR (Knees) ×1 IMPLANT
CEMENT HV SMART SET (Cement) ×4 IMPLANT
CLSR STERI-STRIP ANTIMIC 1/2X4 (GAUZE/BANDAGES/DRESSINGS) ×1 IMPLANT
COVER SURGICAL LIGHT HANDLE (MISCELLANEOUS) ×2 IMPLANT
CUFF TOURN SGL QUICK 34 (TOURNIQUET CUFF) ×2
CUFF TRNQT CYL 34X4.125X (TOURNIQUET CUFF) ×1 IMPLANT
DECANTER SPIKE VIAL GLASS SM (MISCELLANEOUS) ×2 IMPLANT
DRAPE INCISE IOBAN 66X45 STRL (DRAPES) ×6 IMPLANT
DRAPE U-SHAPE 47X51 STRL (DRAPES) ×2 IMPLANT
DRSG AQUACEL AG ADV 3.5X10 (GAUZE/BANDAGES/DRESSINGS) ×2 IMPLANT
DURAPREP 26ML APPLICATOR (WOUND CARE) ×2 IMPLANT
ELECT REM PT RETURN 15FT ADLT (MISCELLANEOUS) ×2 IMPLANT
GLOVE SRG 8 PF TXTR STRL LF DI (GLOVE) ×1 IMPLANT
GLOVE SURG ENC MOIS LTX SZ6.5 (GLOVE) ×2 IMPLANT
GLOVE SURG ENC MOIS LTX SZ8 (GLOVE) ×4 IMPLANT
GLOVE SURG UNDER POLY LF SZ7 (GLOVE) ×2 IMPLANT
GLOVE SURG UNDER POLY LF SZ8 (GLOVE) ×2
GLOVE SURG UNDER POLY LF SZ8.5 (GLOVE) ×2 IMPLANT
GOWN STRL REUS W/TWL LRG LVL3 (GOWN DISPOSABLE) ×4 IMPLANT
GOWN STRL REUS W/TWL XL LVL3 (GOWN DISPOSABLE) ×2 IMPLANT
HANDPIECE INTERPULSE COAX TIP (DISPOSABLE) ×2
HOLDER FOLEY CATH W/STRAP (MISCELLANEOUS) IMPLANT
IMMOBILIZER KNEE 20 (SOFTGOODS) ×2
IMMOBILIZER KNEE 20 THIGH 36 (SOFTGOODS) ×1 IMPLANT
KIT TURNOVER KIT A (KITS) ×2 IMPLANT
MANIFOLD NEPTUNE II (INSTRUMENTS) ×2 IMPLANT
NS IRRIG 1000ML POUR BTL (IV SOLUTION) ×2 IMPLANT
PACK TOTAL KNEE CUSTOM (KITS) ×2 IMPLANT
PADDING CAST COTTON 6X4 STRL (CAST SUPPLIES) ×3 IMPLANT
PIN DRILL FIX HALF THREAD (BIT) ×1 IMPLANT
PIN STEINMAN FIXATION KNEE (PIN) ×1 IMPLANT
PROTECTOR NERVE ULNAR (MISCELLANEOUS) ×2 IMPLANT
SET HNDPC FAN SPRY TIP SCT (DISPOSABLE) ×1 IMPLANT
STRIP CLOSURE SKIN 1/2X4 (GAUZE/BANDAGES/DRESSINGS) ×4 IMPLANT
SUT MNCRL AB 4-0 PS2 18 (SUTURE) ×2 IMPLANT
SUT STRATAFIX 0 PDS 27 VIOLET (SUTURE) ×2
SUT VIC AB 2-0 CT1 27 (SUTURE) ×6
SUT VIC AB 2-0 CT1 TAPERPNT 27 (SUTURE) ×3 IMPLANT
SUTURE STRATFX 0 PDS 27 VIOLET (SUTURE) ×1 IMPLANT
TIBIAL BASE ROT PLAT SZ 8 KNEE (Knees) ×2 IMPLANT
TRAY FOLEY MTR SLVR 16FR STAT (SET/KITS/TRAYS/PACK) ×2 IMPLANT
TUBE SUCTION HIGH CAP CLEAR NV (SUCTIONS) ×2 IMPLANT
WATER STERILE IRR 1000ML POUR (IV SOLUTION) ×4 IMPLANT
WRAP KNEE MAXI GEL POST OP (GAUZE/BANDAGES/DRESSINGS) ×2 IMPLANT

## 2021-07-19 NOTE — Transfer of Care (Signed)
Immediate Anesthesia Transfer of Care Note  Patient: Justin [name]  Procedure(s) Performed: TOTAL KNEE ARTHROPLASTY (Left: Knee)  Patient Location: PACU  Anesthesia Type:Spinal  Level of Consciousness: drowsy and patient cooperative  Airway & Oxygen Therapy: Patient Spontanous Breathing and Patient connected to face mask oxygen  Post-op Assessment: Report given to RN and Post -op Vital signs reviewed and stable  Post vital signs: Reviewed and stable  Last Vitals:  Vitals Value Taken Time  BP 120/80 07/19/21 1111  Temp    Pulse 64 07/19/21 1113  Resp 12 07/19/21 1113  SpO2 100 % 07/19/21 1113  Vitals shown include unvalidated device data.  Last Pain:  Vitals:   07/19/21 0744  TempSrc:   PainSc: 2       Patients Stated Pain Goal: 3 (52/47/99 8001)  Complications: No notable events documented.

## 2021-07-19 NOTE — Progress Notes (Signed)
Assisted Dr. Jana Half with left, ultrasound guided, adductor canal block. Side rails up, monitors on throughout procedure. See vital signs in flow sheet. Tolerated Procedure well.

## 2021-07-19 NOTE — Progress Notes (Signed)
Orthopedic Tech Progress Note Patient Details:  Justin [name] Jan 09, 1956 [id number]  Patient ID: [name], male   DOB: 1955/11/10, 65 y.o.   MRN: [id number]  [name] 07/19/2021, 3:33 PM Cpm removed

## 2021-07-19 NOTE — Progress Notes (Signed)
Patient placed on cpap 10cmH2O with 2L bleed in.  Patient tolerating well at this time.

## 2021-07-19 NOTE — Anesthesia Procedure Notes (Signed)
Spinal  Patient location during procedure: OR Start time: 07/19/2021 9:38 AM Reason for block: surgical anesthesia Staffing Performed: resident/CRNA  Anesthesiologist: Darral Dash, DO Resident/CRNA: Eben Burow, CRNA Preanesthetic Checklist Completed: patient identified, IV checked, site marked, risks and benefits discussed, surgical consent, monitors and equipment checked, pre-op evaluation and timeout performed Spinal Block Patient position: sitting Prep: DuraPrep and site prepped and draped Patient monitoring: heart rate, cardiac monitor, continuous pulse ox and blood pressure Approach: midline Location: L3-4 Injection technique: single-shot Needle Needle type: Pencan  Needle gauge: 24 G Needle length: 9 cm Assessment Sensory level: T10 Events: CSF return Additional Notes Pt placed in sitting position, spinal kit expiration date checked and verified, + CSF, - heme, pt tolerated well. Dr Rex Kras present and supervising throughout.

## 2021-07-19 NOTE — Op Note (Signed)
OPERATIVE REPORT-TOTAL KNEE ARTHROPLASTY   Pre-operative diagnosis- Osteoarthritis  Left knee(s)  Post-operative diagnosis- Osteoarthritis Left knee(s)  Procedure-  Left  Total Knee Arthroplasty  Surgeon- Dione Plover. Jaice Digioia, MD  Assistant- Molli Barrows, PA-C   Anesthesia-   Adductor canal block and spinal  EBL-50 mL   Drains None  Tourniquet time-  Total Tourniquet Time Documented: Thigh (Left) - 37 minutes Total: Thigh (Left) - 37 minutes     Complications- None  Condition-PACU - hemodynamically stable.   Brief Clinical Note   Justin [name] is a 65 y.o. year old male with end stage OA of his left knee with progressively worsening pain and dysfunction. He has constant pain, with activity and at rest and significant functional deficits with difficulties even with ADLs. He has had extensive non-op management including analgesics, injections of cortisone and viscosupplements, and home exercise program, but remains in significant pain with significant dysfunction. Radiographs show bone on bone arthritis medial and patellofemoral with significant tibial subluxation. He presents now for left Total Knee Arthroplasty.     Procedure in detail---   The patient is brought into the operating room and positioned supine on the operating table. After successful administration of  Adductor canal block and spinal,   a tourniquet is placed high on the  Left thigh(s) and the lower extremity is prepped and draped in the usual sterile fashion. Time out is performed by the operating team and then the  Left lower extremity is wrapped in Esmarch, knee flexed and the tourniquet inflated to 300 mmHg.       A midline incision is made with a ten blade through the subcutaneous tissue to the level of the extensor mechanism. A fresh blade is used to make a medial parapatellar arthrotomy. Soft tissue over the proximal medial tibia is subperiosteally elevated to the joint line with a knife and into the  semimembranosus bursa with a Cobb elevator. Soft tissue over the proximal lateral tibia is elevated with attention being paid to avoiding the patellar tendon on the tibial tubercle. The patella is everted, knee flexed 90 degrees and the ACL and PCL are removed. Findings are bone on bone medial and patellofemoral with massive global osteophytes        The drill is used to create a starting hole in the distal femur and the canal is thoroughly irrigated with sterile saline to remove the fatty contents. The 5 degree Left  valgus alignment guide is placed into the femoral canal and the distal femoral cutting block is pinned to remove 9 mm off the distal femur. Resection is made with an oscillating saw.      The tibia is subluxed forward and the menisci are removed. The extramedullary alignment guide is placed referencing proximally at the medial aspect of the tibial tubercle and distally along the second metatarsal axis and tibial crest. The block is pinned to remove 46m off the more deficient medial  side. Resection is made with an oscillating saw. Size 8is the most appropriate size for the tibia and the proximal tibia is prepared with the modular drill and keel punch for that size.      The femoral sizing guide is placed and size 7 is most appropriate. Rotation is marked off the epicondylar axis and confirmed by creating a rectangular flexion gap at 90 degrees. The size 7 cutting block is pinned in this rotation and the anterior, posterior and chamfer cuts are made with the oscillating saw. The intercondylar block is then  placed and that cut is made.      Trial size 8 tibial component, trial size 7 posterior stabilized femur and a 10  mm posterior stabilized rotating platform insert trial is placed. Full extension is achieved with excellent varus/valgus and anterior/posterior balance throughout full range of motion. The patella is everted and thickness measured to be 27  mm. Free hand resection is taken to 15 mm, a  41 template is placed, lug holes are drilled, trial patella is placed, and it tracks normally. Osteophytes are removed off the posterior femur with the trial in place. All trials are removed and the cut bone surfaces prepared with pulsatile lavage. Cement is mixed and once ready for implantation, the size 8 tibial implant, size  7 posterior stabilized femoral component, and the size 41 patella are cemented in place and the patella is held with the clamp. The trial insert is placed and the knee held in full extension. The Exparel (20 ml mixed with 60 ml saline) is injected into the extensor mechanism, posterior capsule, medial and lateral gutters and subcutaneous tissues.  All extruded cement is removed and once the cement is hard the permanent 10 mm posterior stabilized rotating platform insert is placed into the tibial tray.      The wound is copiously irrigated with saline solution and the extensor mechanism closed with # 0 Stratofix suture. The tourniquet is released for a total tourniquet time of 37  minutes. Flexion against gravity is 140 degrees and the patella tracks normally. Subcutaneous tissue is closed with 2.0 vicryl and subcuticular with running 4.0 Monocryl. The incision is cleaned and dried and steri-strips and a bulky sterile dressing are applied. The limb is placed into a knee immobilizer and the patient is awakened and transported to recovery in stable condition.      Please note that a surgical assistant was a medical necessity for this procedure in order to perform it in a safe and expeditious manner. Surgical assistant was necessary to retract the ligaments and vital neurovascular structures to prevent injury to them and also necessary for proper positioning of the limb to allow for anatomic placement of the prosthesis.   Dione Plover Ladarrion Telfair, MD    07/19/2021, 10:54 AM

## 2021-07-19 NOTE — Anesthesia Postprocedure Evaluation (Signed)
Anesthesia Post Note  Patient: Justin [name]  Procedure(s) Performed: TOTAL KNEE ARTHROPLASTY (Left: Knee)     Patient location during evaluation: PACU Anesthesia Type: Regional, MAC and Spinal Level of consciousness: awake and alert Pain management: pain level controlled Vital Signs Assessment: post-procedure vital signs reviewed and stable Respiratory status: spontaneous breathing, nonlabored ventilation, respiratory function stable and patient connected to nasal cannula oxygen Cardiovascular status: stable and blood pressure returned to baseline Postop Assessment: no apparent nausea or vomiting Anesthetic complications: no   No notable events documented.  Last Vitals:  Vitals:   07/19/21 1233 07/19/21 1435  BP: (!) 160/95 (!) 149/89  Pulse: 65 67  Resp: 16 17  Temp: 36.6 C 37.1 C  SpO2: 100% 98%    Last Pain:  Vitals:   07/19/21 1233  TempSrc: Oral  PainSc: 0-No pain                 Belenda Cruise P Dianey Suchy

## 2021-07-19 NOTE — Interval H&P Note (Signed)
History and Physical Interval Note:  07/19/2021 7:19 AM  Justin [name]  has presented today for surgery, with the diagnosis of Left knee osteoarthritis.  The various methods of treatment have been discussed with the patient and family. After consideration of risks, benefits and other options for treatment, the patient has consented to  Procedure(s): TOTAL KNEE ARTHROPLASTY (Left) as a surgical intervention.  The patient's history has been reviewed, patient examined, no change in status, stable for surgery.  I have reviewed the patient's chart and labs.  Questions were answered to the patient's satisfaction.     [name]

## 2021-07-19 NOTE — Evaluation (Signed)
Physical Therapy Evaluation Patient Details Name: Justin [name] MRN: [id number] DOB: 1956/03/21 Today's Date: 07/19/2021  History of Present Illness  Pt is a 65 year old male s/p Lt TKA on 07/19/21 and was recently diagnosed with vascular dementia.  Clinical Impression  Pt is s/p TKA resulting in the deficits listed below (see PT Problem List). Pt will benefit from skilled PT to increase their independence and safety with mobility to allow discharge to the venue listed below. Pt eager to mobilize and assisted with ambulating short distance in hallway POD #0.  Pt plans to d/c home with spouse.       Recommendations for follow up therapy are one component of a multi-disciplinary discharge planning process, led by the attending physician.  Recommendations may be updated based on patient status, additional functional criteria and insurance authorization.  Follow Up Recommendations Follow surgeon's recommendation for DC plan and follow-up therapies    Equipment Recommendations  None recommended by PT    Recommendations for Other Services       Precautions / Restrictions Precautions Precautions: Fall;Knee Required Braces or Orthoses: Knee Immobilizer - Left Knee Immobilizer - Left: Discontinue once straight leg raise with < 10 degree lag Restrictions Weight Bearing Restrictions: No Other Position/Activity Restrictions: WBAT      Mobility  Bed Mobility Overal bed mobility: Needs Assistance Bed Mobility: Supine to Sit     Supine to sit: Min guard;HOB elevated     General bed mobility comments: verbal cues for technique    Transfers Overall transfer level: Needs assistance Equipment used: Rolling walker (2 wheeled) Transfers: Sit to/from Stand Sit to Stand: Min assist         General transfer comment: verbal cues for UE and LE positioning, light assist to rise and steady  Ambulation/Gait Ambulation/Gait assistance: Min guard Gait Distance (Feet): 80 Feet Assistive  device: Rolling walker (2 wheeled) Gait Pattern/deviations: Step-to pattern;Decreased stance time - left;Antalgic     General Gait Details: verbal cues for sequence, RW positioning, posture  Stairs            Wheelchair Mobility    Modified Rankin (Stroke Patients Only)       Balance                                             Pertinent Vitals/Pain Pain Assessment: 0-10 Pain Score: 2  Pain Location: left knee Pain Descriptors / Indicators: Sore Pain Intervention(s): Repositioned;Monitored during session    Home Living Family/patient expects to be discharged to:: Private residence Living Arrangements: Spouse/significant other   Type of Home: House Home Access: Stairs to enter Entrance Stairs-Rails: None Entrance Stairs-Number of Steps: 2 Home Layout: Able to live on main level with bedroom/bathroom Home Equipment: Environmental consultant - 2 wheels;Bedside commode      Prior Function Level of Independence: Independent               Hand Dominance        Extremity/Trunk Assessment        Lower Extremity Assessment Lower Extremity Assessment: LLE deficits/detail LLE Deficits / Details: pt able to perform SLR however with lag so utilized KI, able to perform ankle pumps       Communication   Communication: No difficulties  Cognition Arousal/Alertness: Awake/alert Behavior During Therapy: WFL for tasks assessed/performed Overall Cognitive Status: Within Functional Limits for tasks assessed  General Comments: pleasant and follows commands, recent diagnosis of vascular dementia      General Comments      Exercises     Assessment/Plan    PT Assessment Patient needs continued PT services  PT Problem List Decreased strength;Decreased mobility;Decreased range of motion;Decreased knowledge of use of DME;Pain;Decreased knowledge of precautions       PT Treatment Interventions Stair training;Gait  training;DME instruction;Therapeutic exercise;Balance training;Functional mobility training;Therapeutic activities;Patient/family education    PT Goals (Current goals can be found in the Care Plan section)  Acute Rehab PT Goals PT Goal Formulation: With patient Time For Goal Achievement: 07/24/21 Potential to Achieve Goals: Good    Frequency 7X/week   Barriers to discharge        Co-evaluation               AM-PAC PT "6 Clicks" Mobility  Outcome Measure Help needed turning from your back to your side while in a flat bed without using bedrails?: A Little Help needed moving from lying on your back to sitting on the side of a flat bed without using bedrails?: A Little Help needed moving to and from a bed to a chair (including a wheelchair)?: A Little Help needed standing up from a chair using your arms (e.g., wheelchair or bedside chair)?: A Little Help needed to walk in hospital room?: A Little Help needed climbing 3-5 steps with a railing? : A Little 6 Click Score: 18    End of Session Equipment Utilized During Treatment: Gait belt;Left knee immobilizer Activity Tolerance: Patient tolerated treatment well Patient left: in chair;with call bell/phone within reach;with chair alarm set;with family/visitor present Nurse Communication: Mobility status PT Visit Diagnosis: Other abnormalities of gait and mobility (R26.89)    Time: 9774-1423 PT Time Calculation (min) (ACUTE ONLY): 21 min   Charges:   PT Evaluation $PT Eval Low Complexity: 1 Low        Kati PT, DPT Acute Rehabilitation Services Pager: [phone] Office: [phone]   Myrtis Hopping [place] 07/19/2021, 4:59 PM

## 2021-07-19 NOTE — Anesthesia Procedure Notes (Signed)
Procedure Name: MAC Date/Time: 07/19/2021 9:35 AM Performed by: Eben Burow, CRNA Pre-anesthesia Checklist: Patient identified, Emergency Drugs available, Suction available, Patient being monitored and Timeout performed Oxygen Delivery Method: Simple face mask Placement Confirmation: positive ETCO2

## 2021-07-19 NOTE — Anesthesia Procedure Notes (Signed)
Anesthesia Regional Block: Adductor canal block   Pre-Anesthetic Checklist: , timeout performed,  Correct Patient, Correct Site, Correct Laterality,  Correct Procedure, Correct Position, site marked,  Risks and benefits discussed,  Surgical consent,  Pre-op evaluation,  At surgeon's request and post-op pain management  Laterality: Left  Prep: Dura Prep       Needles:  Injection technique: Single-shot  Needle Type: Echogenic Stimulator Needle     Needle Length: 10cm  Needle Gauge: 20     Additional Needles:   Procedures:,,,, ultrasound used (permanent image in chart),,    Narrative:  Start time: 07/19/2021 8:55 AM End time: 07/19/2021 8:58 AM Injection made incrementally with aspirations every 5 mL.  Performed by: Personally  Anesthesiologist: Darral Dash, DO  Additional Notes: Patient identified. Risks/Benefits/Options discussed with patient including but not limited to bleeding, infection, nerve damage, failed block, incomplete pain control. Patient expressed understanding and wished to proceed. All questions were answered. Sterile technique was used throughout the entire procedure. Please see nursing notes for vital signs. Aspirated in 5cc intervals with injection for negative confirmation. Patient was given instructions on fall risk and not to get out of bed. All questions and concerns addressed with instructions to call with any issues or inadequate analgesia.

## 2021-07-19 NOTE — Discharge Instructions (Addendum)
Gaynelle Arabian, MD Total Joint Specialist [facility] [address] [place], [place] [address] [phone]  TOTAL KNEE REPLACEMENT POSTOPERATIVE DIRECTIONS    Knee Rehabilitation, Guidelines Following Surgery  Results after knee surgery are often greatly improved when you follow the exercise, range of motion and muscle strengthening exercises prescribed by your doctor. Safety measures are also important to protect the knee from further injury. If any of these exercises cause you to have increased pain or swelling in your knee joint, decrease the amount until you are comfortable again and slowly increase them. If you have problems or questions, call your caregiver or physical therapist for advice.    BLOOD CLOT PREVENTION Take a 10 mg Xarelto once a day for three weeks following surgery. Then take an 81 mg Aspirin once a day for three weeks. Then discontinue Aspirin. You may resume your vitamins/supplements once you have discontinued the Xarelto. Do not take any NSAIDs (Advil, Aleve, Ibuprofen, Meloxicam, etc.) until you have discontinued the Xarelto.    HOME CARE INSTRUCTIONS  Remove items at home which could result in a fall. This includes throw rugs or furniture in walking pathways.  ICE to the affected knee as much as tolerated. Icing helps control swelling. If the swelling is well controlled you will be more comfortable and rehab easier. Continue to use ice on the knee for pain and swelling from surgery. You may notice swelling that will progress down to the foot and ankle. This is normal after surgery. Elevate the leg when you are not up walking on it.    Continue to use the breathing machine which will help keep your temperature down. It is common for your temperature to cycle up and down following surgery, especially at night when you are not up moving around and exerting yourself. The breathing machine keeps your lungs expanded and your temperature  down. Do not place pillow under the operative knee, focus on keeping the knee straight while resting  DIET You may resume your previous home diet once you are discharged from the hospital.  DRESSING / WOUND CARE / SHOWERING Keep your bulky bandage on for 2 days. On the third post-operative day you may remove the Ace bandage and gauze. There is a waterproof adhesive bandage on your skin which will stay in place until your first follow-up appointment. Once you remove this you will not need to place another bandage You may begin showering 3 days following surgery, but do not submerge the incision under water.  ACTIVITY For the first 5 days, the key is rest and control of pain and swelling Do your home exercises twice a day starting on post-operative day 3. On the days you go to physical therapy, just do the home exercises once that day. You should rest, ice and elevate the leg for 50 minutes out of every hour. Get up and walk/stretch for 10 minutes per hour. After 5 days you can increase your activity slowly as tolerated. Walk with your walker as instructed. Use the walker until you are comfortable transitioning to a cane. Walk with the cane in the opposite hand of the operative leg. You may discontinue the cane once you are comfortable and walking steadily. Avoid periods of inactivity such as sitting longer than an hour when not asleep. This helps prevent blood clots.  You may discontinue the knee immobilizer once you are able to perform a straight leg raise while lying down. You may resume a sexual relationship in one  month or when given the OK by your doctor.  You may return to work once you are cleared by your doctor.  Do not drive a car for 6 weeks or until released by your surgeon.  Do not drive while taking narcotics.  TED HOSE STOCKINGS Wear the elastic stockings on both legs for three weeks following surgery during the day. You may remove them at night for sleeping.  WEIGHT  BEARING Weight bearing as tolerated with assist device (walker, cane, etc) as directed, use it as long as suggested by your surgeon or therapist, typically at least 4-6 weeks.  POSTOPERATIVE CONSTIPATION PROTOCOL Constipation - defined medically as fewer than three stools per week and severe constipation as less than one stool per week.  One of the most common issues patients have following surgery is constipation.  Even if you have a regular bowel pattern at home, your normal regimen is likely to be disrupted due to multiple reasons following surgery.  Combination of anesthesia, postoperative narcotics, change in appetite and fluid intake all can affect your bowels.  In order to avoid complications following surgery, here are some recommendations in order to help you during your recovery period.  Colace (docusate) - Pick up an over-the-counter form of Colace or another stool softener and take twice a day as long as you are requiring postoperative pain medications.  Take with a full glass of water daily.  If you experience loose stools or diarrhea, hold the colace until you stool forms back up. If your symptoms do not get better within 1 week or if they get worse, check with your doctor. Dulcolax (bisacodyl) - Pick up over-the-counter and take as directed by the product packaging as needed to assist with the movement of your bowels.  Take with a full glass of water.  Use this product as needed if not relieved by Colace only.  MiraLax (polyethylene glycol) - Pick up over-the-counter to have on hand. MiraLax is a solution that will increase the amount of water in your bowels to assist with bowel movements.  Take as directed and can mix with a glass of water, juice, soda, coffee, or tea. Take if you go more than two days without a movement. Do not use MiraLax more than once per day. Call your doctor if you are still constipated or irregular after using this medication for 7 days in a row.  If you continue  to have problems with postoperative constipation, please contact the office for further assistance and recommendations.  If you experience "the worst abdominal pain ever" or develop nausea or vomiting, please contact the office immediatly for further recommendations for treatment.  ITCHING If you experience itching with your medications, try taking only a single pain pill, or even half a pain pill at a time.  You can also use Benadryl over the counter for itching or also to help with sleep.   MEDICATIONS See your medication summary on the "After Visit Summary" that the nursing staff will review with you prior to discharge.  You may have some home medications which will be placed on hold until you complete the course of blood thinner medication.  It is important for you to complete the blood thinner medication as prescribed by your surgeon.  Continue your approved medications as instructed at time of discharge.  PRECAUTIONS If you experience chest pain or shortness of breath - call 911 immediately for transfer to the hospital emergency department.  If you develop a fever greater that 101  F, purulent drainage from wound, increased redness or drainage from wound, foul odor from the wound/dressing, or calf pain - CONTACT YOUR SURGEON.                                                   FOLLOW-UP APPOINTMENTS Make sure you keep all of your appointments after your operation with your surgeon and caregivers. You should call the office at the above phone number and make an appointment for approximately two weeks after the date of your surgery or on the date instructed by your surgeon outlined in the "After Visit Summary".  RANGE OF MOTION AND STRENGTHENING EXERCISES  Rehabilitation of the knee is important following a knee injury or an operation. After just a few days of immobilization, the muscles of the thigh which control the knee become weakened and shrink (atrophy). Knee exercises are designed to build up  the tone and strength of the thigh muscles and to improve knee motion. Often times heat used for twenty to thirty minutes before working out will loosen up your tissues and help with improving the range of motion but do not use heat for the first two weeks following surgery. These exercises can be done on a training (exercise) mat, on the floor, on a table or on a bed. Use what ever works the best and is most comfortable for you Knee exercises include:  Leg Lifts - While your knee is still immobilized in a splint or cast, you can do straight leg raises. Lift the leg to 60 degrees, hold for 3 sec, and slowly lower the leg. Repeat 10-20 times 2-3 times daily. Perform this exercise against resistance later as your knee gets better.  Quad and Hamstring Sets - Tighten up the muscle on the front of the thigh (Quad) and hold for 5-10 sec. Repeat this 10-20 times hourly. Hamstring sets are done by pushing the foot backward against an object and holding for 5-10 sec. Repeat as with quad sets.  Leg Slides: Lying on your back, slowly slide your foot toward your buttocks, bending your knee up off the floor (only go as far as is comfortable). Then slowly slide your foot back down until your leg is flat on the floor again. Angel Wings: Lying on your back spread your legs to the side as far apart as you can without causing discomfort.  A rehabilitation program following serious knee injuries can speed recovery and prevent re-injury in the future due to weakened muscles. Contact your doctor or a physical therapist for more information on knee rehabilitation.   POST-OPERATIVE OPIOID TAPER INSTRUCTIONS: It is important to wean off of your opioid medication as soon as possible. If you do not need pain medication after your surgery it is ok to stop day one. Opioids include: Codeine, Hydrocodone(Norco, Vicodin), Oxycodone(Percocet, oxycontin) and hydromorphone amongst others.  Long term and even short term use of opiods can  cause: Increased pain response Dependence Constipation Depression Respiratory depression And more.  Withdrawal symptoms can include Flu like symptoms Nausea, vomiting And more Techniques to manage these symptoms Hydrate well Eat regular healthy meals Stay active Use relaxation techniques(deep breathing, meditating, yoga) Do Not substitute Alcohol to help with tapering If you have been on opioids for less than two weeks and do not have pain than it is ok to stop all together.  Plan to wean off of opioids This plan should start within one week post op of your joint replacement. Maintain the same interval or time between taking each dose and first decrease the dose.  Cut the total daily intake of opioids by one tablet each day Next start to increase the time between doses. The last dose that should be eliminated is the evening dose.   IF YOU ARE TRANSFERRED TO A SKILLED REHAB FACILITY If the patient is transferred to a skilled rehab facility following release from the hospital, a list of the current medications will be sent to the facility for the patient to continue.  When discharged from the skilled rehab facility, please have the facility set up the patient's [place] prior to being released. Also, the skilled facility will be responsible for providing the patient with their medications at time of release from the facility to include their pain medication, the muscle relaxants, and their blood thinner medication. If the patient is still at the rehab facility at time of the two week follow up appointment, the skilled rehab facility will also need to assist the patient in arranging follow up appointment in our office and any transportation needs.  MAKE SURE YOU:  Understand these instructions.  Get help right away if you are not doing well or get worse.   DENTAL ANTIBIOTICS:  In most cases prophylactic antibiotics for Dental procdeures after total joint surgery are  not necessary.  Exceptions are as follows:  1. History of prior total joint infection  2. Severely immunocompromised (Organ Transplant, cancer chemotherapy, Rheumatoid biologic meds such as Ephraim)  3. Poorly controlled diabetes (A1C &gt; 8.0, blood glucose over 200)  If you have one of these conditions, contact your surgeon for an antibiotic prescription, prior to your dental procedure.    Pick up stool softner and laxative for home use following surgery while on pain medications. Do not submerge incision under water. Please use good hand washing techniques while changing dressing each day. May shower starting three days after surgery. Please use a clean towel to pat the incision dry following showers. Continue to use ice for pain and swelling after surgery. Do not use any lotions or creams on the incision until instructed by your surgeon.      Information on my medicine - XARELTO (Rivaroxaban)  This medication education was reviewed with me or my healthcare representative as part of my discharge preparation.    Why was Xarelto prescribed for you? Xarelto was prescribed for you to reduce the risk of blood clots forming after orthopedic surgery. The medical term for these abnormal blood clots is venous thromboembolism (VTE).  What do you need to know about xarelto ? Take your Xarelto ONCE DAILY at the same time every day. You may take it either with or without food.  If you have difficulty swallowing the tablet whole, you may crush it and mix in applesauce just prior to taking your dose.  Take Xarelto exactly as prescribed by your doctor and DO NOT stop taking Xarelto without talking to the doctor who prescribed the medication.  Stopping without other VTE prevention medication to take the place of Xarelto may increase your risk of developing a clot.  After discharge, you should have regular check-up appointments with your healthcare provider that is prescribing your  Xarelto.    What do you do if you miss a dose? If you miss a dose, take it as soon as you remember on the same  day then continue your regularly scheduled once daily regimen the next day. Do not take two doses of Xarelto on the same day.   Important Safety Information A possible side effect of Xarelto is bleeding. You should call your healthcare provider right away if you experience any of the following: Bleeding from an injury or your nose that does not stop. Unusual colored urine (red or dark brown) or unusual colored stools (red or black). Unusual bruising for unknown reasons. A serious fall or if you hit your head (even if there is no bleeding).  Some medicines may interact with Xarelto and might increase your risk of bleeding while on Xarelto. To help avoid this, consult your healthcare provider or pharmacist prior to using any new prescription or non-prescription medications, including herbals, vitamins, non-steroidal anti-inflammatory drugs (NSAIDs) and supplements.  This website has more information on Xarelto: [email or web address]

## 2021-07-19 NOTE — Anesthesia Preprocedure Evaluation (Addendum)
Anesthesia Evaluation  Patient identified by MRN, date of birth, ID band Patient awake    Reviewed: Allergy & Precautions, NPO status , Patient's Chart, lab work & pertinent test results  Airway Mallampati: II  TM Distance: >3 FB Neck ROM: Full    Dental  (+) Teeth Intact   Pulmonary sleep apnea and Continuous Positive Airway Pressure Ventilation ,    Pulmonary exam normal        Cardiovascular hypertension, Pt. on medications  Rhythm:Regular Rate:Normal     Neuro/Psych Anxiety Dementia CVA, Residual Symptoms    GI/Hepatic Neg liver ROS, PUD, GERD  Medicated,  Endo/Other  negative endocrine ROS  Renal/GU negative Renal ROS  negative genitourinary   Musculoskeletal  (+) Arthritis , Osteoarthritis,    Abdominal (+)  Abdomen: soft. Bowel sounds: normal.  Peds  Hematology negative hematology ROS (+)   Anesthesia Other Findings   Reproductive/Obstetrics                             Anesthesia Physical Anesthesia Plan  ASA: 3  Anesthesia Plan: MAC, Regional and Spinal   Post-op Pain Management:  Regional for Post-op pain   Induction:   PONV Risk Score and Plan: 1 and Ondansetron, Dexamethasone, Propofol infusion and Treatment may vary due to age or medical condition  Airway Management Planned: Simple Face Mask, Natural Airway and Nasal Cannula  Additional Equipment: None  Intra-op Plan:   Post-operative Plan:   Informed Consent: I have reviewed the patients History and Physical, chart, labs and discussed the procedure including the risks, benefits and alternatives for the proposed anesthesia with the patient or authorized representative who has indicated his/her understanding and acceptance.     Dental advisory given  Plan Discussed with: CRNA  Anesthesia Plan Comments: (Lab Results      Component                Value               Date                      WBC                       7.0                 07/08/2021                HGB                      15.5                07/08/2021                HCT                      46.9                07/08/2021                MCV                      96.1                07/08/2021                PLT  297                 07/08/2021           Lab Results      Component                Value               Date                      NA                       139                 07/08/2021                K                        4.2                 07/08/2021                CO2                      28                  07/08/2021                GLUCOSE                  106 (H)             07/08/2021                BUN                      17                  07/08/2021                CREATININE               1.00                07/08/2021                CALCIUM                  9.2                 07/08/2021                GFRNONAA                 >60                 07/08/2021                GFRAA                    92                  12/21/2020          )        Anesthesia Quick Evaluation

## 2021-07-19 NOTE — Progress Notes (Signed)
Orthopedic Tech Progress Note Patient Details:  Justin [name] Apr 21, 1956 [id number]  Patient ID: [name], male   DOB: 1956/08/03, 65 y.o.   MRN: [id number]  [name] 07/19/2021, 11:39 AM Cpm applied in pacu

## 2021-07-20 ENCOUNTER — Encounter (HOSPITAL_COMMUNITY): Payer: Self-pay | Admitting: Orthopedic Surgery

## 2021-07-20 DIAGNOSIS — R4189 Other symptoms and signs involving cognitive functions and awareness: Secondary | ICD-10-CM | POA: Insufficient documentation

## 2021-07-20 DIAGNOSIS — M1712 Unilateral primary osteoarthritis, left knee: Secondary | ICD-10-CM | POA: Diagnosis not present

## 2021-07-20 LAB — CBC
HCT: 44.1 % (ref 39.0–52.0)
Hemoglobin: 15 g/dL (ref 13.0–17.0)
MCH: 31.9 pg (ref 26.0–34.0)
MCHC: 34 g/dL (ref 30.0–36.0)
MCV: 93.8 fL (ref 80.0–100.0)
Platelets: 288 10*3/uL (ref 150–400)
RBC: 4.7 MIL/uL (ref 4.22–5.81)
RDW: 12.3 % (ref 11.5–15.5)
WBC: 18.4 10*3/uL — ABNORMAL HIGH (ref 4.0–10.5)
nRBC: 0 % (ref 0.0–0.2)

## 2021-07-20 LAB — BASIC METABOLIC PANEL
Anion gap: 9 (ref 5–15)
BUN: 17 mg/dL (ref 8–23)
CO2: 25 mmol/L (ref 22–32)
Calcium: 8.7 mg/dL — ABNORMAL LOW (ref 8.9–10.3)
Chloride: 104 mmol/L (ref 98–111)
Creatinine, Ser: 1.01 mg/dL (ref 0.61–1.24)
GFR, Estimated: >60 mL/min (ref >60)
Glucose, Bld: 134 mg/dL — ABNORMAL HIGH (ref 70–99)
Potassium: 4.3 mmol/L (ref 3.5–5.1)
Sodium: 138 mmol/L (ref 135–145)

## 2021-07-20 MED ORDER — RIVAROXABAN 10 MG PO TABS: ORAL_TABLET | ORAL | 0 refills | Status: DC

## 2021-07-20 MED ORDER — OXYCODONE HCL 5 MG PO TABS: 5.0000 mg | ORAL_TABLET | Freq: Four times a day (QID) | ORAL | 0 refills | Status: DC | PRN

## 2021-07-20 MED ORDER — METHOCARBAMOL 500 MG PO TABS: 500.0000 mg | ORAL_TABLET | Freq: Four times a day (QID) | ORAL | 0 refills | Status: DC | PRN

## 2021-07-20 NOTE — Progress Notes (Signed)
Physical Therapy Treatment Patient Details Name: Justin [name] MRN: [id number] DOB: 05/02/1956 Today's Date: 07/20/2021   History of Present Illness Pt is a 65 year old male s/p Lt TKA on 07/19/21 and was recently diagnosed with vascular dementia.    PT Comments    Pt assisted with ambulating and practicing safe stair technique.  Pt requiring min assist this afternoon for stability so discussed cues and safety recommendations for home.  Pt has gait belt to take home as well.  Pt and spouse have been educated on safe mobility, stairs and exercises and provided with handouts.    Recommendations for follow up therapy are one component of a multi-disciplinary discharge planning process, led by the attending physician.  Recommendations may be updated based on patient status, additional functional criteria and insurance authorization.  Follow Up Recommendations  Follow surgeon's recommendation for DC plan and follow-up therapies     Equipment Recommendations  None recommended by PT    Recommendations for Other Services       Precautions / Restrictions Precautions Precautions: Fall;Knee Required Braces or Orthoses: Knee Immobilizer - Left Knee Immobilizer - Left: Discontinue once straight leg raise with < 10 degree lag Restrictions Other Position/Activity Restrictions: WBAT     Mobility  Bed Mobility Overal bed mobility: Needs Assistance Bed Mobility: Supine to Sit     Supine to sit: Supervision     General bed mobility comments: pt in recliner    Transfers Overall transfer level: Needs assistance Equipment used: Rolling walker (2 wheeled) Transfers: Sit to/from Stand Sit to Stand: Min assist         General transfer comment: verbal cues for UE and LE positioning, assist to steaady with rise; pt also had LOB and required assist to control descent to recliner, reviewed safe technique with spouse and use of gait for home  Ambulation/Gait Ambulation/Gait assistance: Min  guard Gait Distance (Feet): 200 Feet Assistive device: Rolling walker (2 wheeled) Gait Pattern/deviations: Step-to pattern;Decreased stance time - left;Antalgic     General Gait Details: verbal cues for sequence, RW positioning, posture   Stairs Stairs: Yes Stairs assistance: Min assist Stair Management: Step to pattern;Backwards;With walker Number of Stairs: 2 General stair comments: multimodal cues for sequence and safety; spouse present and educated on holding walker and providing cues for pt; recommended a third person to assist with pt's stability and hold gait belt with return home for safety; pt practiced steps twice   Wheelchair Mobility    Modified Rankin (Stroke Patients Only)       Balance                                            Cognition Arousal/Alertness: Awake/alert Behavior During Therapy: WFL for tasks assessed/performed Overall Cognitive Status: Within Functional Limits for tasks assessed                                 General Comments: pleasant and follows commands, recent diagnosis of vascular dementia      Exercises    General Comments        Pertinent Vitals/Pain Pain Assessment: 0-10 Pain Score: 3  Pain Location: left knee Pain Descriptors / Indicators: Sore;Aching Pain Intervention(s): Repositioned;Monitored during session    Home Living  Prior Function            PT Goals (current goals can now be found in the care plan section) Progress towards PT goals: Progressing toward goals    Frequency    7X/week      PT Plan Current plan remains appropriate    Co-evaluation              AM-PAC PT "6 Clicks" Mobility   Outcome Measure  Help needed turning from your back to your side while in a flat bed without using bedrails?: A Little Help needed moving from lying on your back to sitting on the side of a flat bed without using bedrails?: A Little Help  needed moving to and from a bed to a chair (including a wheelchair)?: A Little Help needed standing up from a chair using your arms (e.g., wheelchair or bedside chair)?: A Little Help needed to walk in hospital room?: A Little Help needed climbing 3-5 steps with a railing? : A Little 6 Click Score: 18    End of Session Equipment Utilized During Treatment: Gait belt Activity Tolerance: Patient tolerated treatment well Patient left: in chair;with call bell/phone within reach;with family/visitor present Nurse Communication: Mobility status PT Visit Diagnosis: Other abnormalities of gait and mobility (R26.89)     Time: 5189-8421 PT Time Calculation (min) (ACUTE ONLY): 19 min  Charges:  $Gait Training: 8-22 mins           Arlyce Dice, DPT Acute Rehabilitation Services Pager: [phone] Office: [place] 07/20/2021, 3:09 PM

## 2021-07-20 NOTE — Progress Notes (Signed)
Physical Therapy Treatment Patient Details Name: Justin [name] MRN: [id number] DOB: 07-13-1956 Today's Date: 07/20/2021   History of Present Illness Pt is a 65 year old male s/p Lt TKA on 07/19/21 and was recently diagnosed with vascular dementia.    PT Comments    Pt assisted with ambulating in hallway and performing LE exercises.  Will practice steps this afternoon in preparation for d/c home.    Recommendations for follow up therapy are one component of a multi-disciplinary discharge planning process, led by the attending physician.  Recommendations may be updated based on patient status, additional functional criteria and insurance authorization.  Follow Up Recommendations  Follow surgeon's recommendation for DC plan and follow-up therapies     Equipment Recommendations  None recommended by PT    Recommendations for Other Services       Precautions / Restrictions Precautions Precautions: Fall;Knee Required Braces or Orthoses: Knee Immobilizer - Left Knee Immobilizer - Left: Discontinue once straight leg raise with < 10 degree lag Restrictions Other Position/Activity Restrictions: WBAT     Mobility  Bed Mobility Overal bed mobility: Needs Assistance Bed Mobility: Supine to Sit     Supine to sit: Supervision          Transfers Overall transfer level: Needs assistance Equipment used: Rolling walker (2 wheeled) Transfers: Sit to/from Stand Sit to Stand: Min guard         General transfer comment: verbal cues for UE and LE positioning  Ambulation/Gait Ambulation/Gait assistance: Min guard Gait Distance (Feet): 160 Feet Assistive device: Rolling walker (2 wheeled) Gait Pattern/deviations: Step-to pattern;Decreased stance time - left;Antalgic     General Gait Details: verbal cues for sequence, RW positioning, posture   Stairs             Wheelchair Mobility    Modified Rankin (Stroke Patients Only)       Balance                                             Cognition Arousal/Alertness: Awake/alert Behavior During Therapy: WFL for tasks assessed/performed Overall Cognitive Status: Within Functional Limits for tasks assessed                                 General Comments: pleasant and follows commands, recent diagnosis of vascular dementia      Exercises Total Joint Exercises Ankle Circles/Pumps: AROM;Both;10 reps Quad Sets: AROM;Both;10 reps Short Arc Quad: AROM;Left;10 reps Heel Slides: AAROM;Left;Seated;10 reps Hip ABduction/ADduction: AROM;Left;10 reps Straight Leg Raises: AAROM;Left;10 reps Goniometric ROM: approx 75* AAROM left knee flexion    General Comments        Pertinent Vitals/Pain Pain Assessment: 0-10 Pain Score: 3  Pain Location: left knee Pain Descriptors / Indicators: Sore;Aching Pain Intervention(s): Repositioned;Monitored during session    Home Living                      Prior Function            PT Goals (current goals can now be found in the care plan section) Progress towards PT goals: Progressing toward goals    Frequency    7X/week      PT Plan Current plan remains appropriate    Co-evaluation  AM-PAC PT "6 Clicks" Mobility   Outcome Measure  Help needed turning from your back to your side while in a flat bed without using bedrails?: A Little Help needed moving from lying on your back to sitting on the side of a flat bed without using bedrails?: A Little Help needed moving to and from a bed to a chair (including a wheelchair)?: A Little Help needed standing up from a chair using your arms (e.g., wheelchair or bedside chair)?: A Little Help needed to walk in hospital room?: A Little Help needed climbing 3-5 steps with a railing? : A Little 6 Click Score: 18    End of Session Equipment Utilized During Treatment: Gait belt Activity Tolerance: Patient tolerated treatment well Patient left: in chair;with call  bell/phone within reach;with family/visitor present   PT Visit Diagnosis: Other abnormalities of gait and mobility (R26.89)     Time: 0063-4949 PT Time Calculation (min) (ACUTE ONLY): 22 min  Charges:  $Therapeutic Exercise: 8-22 mins                     Jannette Spanner PT, DPT Acute Rehabilitation Services Pager: [phone] Office: [place] 07/20/2021, 12:32 PM

## 2021-07-20 NOTE — Progress Notes (Signed)
I am ok with reducing / tapering off Keppra. I send a note to [place] regarding taper. Neurocognitive diagnosis was vascular dementia.

## 2021-07-20 NOTE — Progress Notes (Signed)
Patient's EEG was o the cone server with video, I am sorry it was not read yet.

## 2021-07-20 NOTE — Procedures (Signed)
This EEG recording was performed under the international 10-20 placement system of electrodes and included photic stimulation but not hyperventilation maneuvers.   The patient was described as able to follow simple commands and appeared alert. The tracing was 45 minutes in duration.    Video recording accompanies this EEG recording.  A posterior dominant rhythm of 9 Hz was displayed in both posterior hemispheres upon eye closure.  The patient then began talking and laughing and there is a bit of motion artifact noted before photic stimulation began.  Under a 3, 5, 9, 12, hertz stimulation there is photic entrainment noted which continues also to 15 Hz and 18 Hz.  No epileptiform discharges arise.  A brief coughing spell interrupted the tracing.  The EKG shows variable R to R interval's but the single tracing is not enough to diagnose an arrhythmia.  After photic stimulation the patient became quiet and drowsy but never entered into sleep non-REM sleep stage II.  The posterior hemispheres gained an amplitude and there was clear high amplitude gain over the central electrode.    The patient woke up with another coughing spell and became drowsy again.  This time he proceeded to sleep with some K complexes consisting of vertex sharp waves.  Once the study is completed the patient was asked orientated questions about date he could not remember the month but quoted correctly that it was the year 2022.  He did not appear confused.  No epileptiform activity was noted, the dominant background rhythm was normal at 9 hertz and symmetric activity was noted.   Normal EEG.  [name], MD

## 2021-07-20 NOTE — Progress Notes (Signed)
Subjective: 1 Day Post-Op Procedure(s) (LRB): TOTAL KNEE ARTHROPLASTY (Left) Patient reports pain as mild.   Patient seen in rounds by Dr. Wynelle Link. Patient is well, and has had no acute complaints or problems. Denies SOB, chest pain, or calf pain. No acute overnight events. Ambulated 80 feet with therapy yesterday. Will continue therapy today.    Objective: Vital signs in last 24 hours: Temp:  [97.6 F (36.4 C)-98.8 F (37.1 C)] 97.9 F (36.6 C) (09/13 0622) Pulse Rate:  [56-74] 63 (09/13 0622) Resp:  [12-25] 18 (09/13 0622) BP: (120-174)/(61-101) 142/84 (09/13 0622) SpO2:  [92 %-100 %] 98 % (09/13 0622)  Intake/Output from previous day:  Intake/Output Summary (Last 24 hours) at 07/20/2021 0837 Last data filed at 07/20/2021 0600 Gross per 24 hour  Intake 3690.87 ml  Output 1900 ml  Net 1790.87 ml     Intake/Output this shift: No intake/output data recorded.  Labs: Recent Labs    07/20/21 0255  HGB 15.0   Recent Labs    07/20/21 0255  WBC 18.4*  RBC 4.70  HCT 44.1  PLT 288   Recent Labs    07/20/21 0255  NA 138  K 4.3  CL 104  CO2 25  BUN 17  CREATININE 1.01  GLUCOSE 134*  CALCIUM 8.7*   No results for input(s): LABPT, INR in the last 72 hours.  Exam: General - Patient is Alert and Oriented Extremity - Neurologically intact Neurovascular intact Intact pulses distally Dorsiflexion/Plantar flexion intact Dressing - dressing C/D/I Motor Function - intact, moving foot and toes well on exam.   Past Medical History:  Diagnosis Date   Acid reflux 74/94/4967   Acute metabolic encephalopathy 59/16/3846   Anxiety    Cerebral embolism with cerebral infarction 04/15/2018   Chest pain 05/19/2015   EEG abnormality without seizure 05/19/2021   Encephalitis 04/30/2021   Encephalopathy due to COVID-19 virus    Episodic confusion 05/19/2021   Heart murmur    Hyperglycemia 05/19/2015   Hypertension    Left thalamic lacunar infarct 05/24/2018   OSA  (obstructive sleep apnea)    cpap 9 cm H2O   Recurrent unilateral inguinal hernia with obstruction and without gangrene    SBO (small bowel obstruction)    Ulcerative colitis 09/24/2019   Vascular dementia without behavioral disturbance 05/19/2021    Assessment/Plan: 1 Day Post-Op Procedure(s) (LRB): TOTAL KNEE ARTHROPLASTY (Left) Principal Problem:   OA (osteoarthritis) of knee  Estimated body mass index is 27.44 kg/m as calculated from the following:   Height as of this encounter: 6' 1"  (1.854 m).   Weight as of this encounter: 94.3 kg. Advance diet   Patient's anticipated LOS is less than 2 midnights, meeting these requirements: - Lives within 1 hour of care - Has a competent adult at home to recover with post-op - NO history of  - Chronic pain requiring opioids  - Diabetes  - Coronary Artery Disease  - Heart failure  - Heart attack  - Stroke  - DVT/VTE  - Cardiac arrhythmia  - Respiratory Failure/COPD  - Renal failure  - Anemia  - Advanced Liver disease     DVT Prophylaxis - Xarelto and TED hose Weight bearing as tolerated. Continue therapy.  Plan is to go Home after hospital stay.  Plan for two sessions with PT this morning, and if meeting goals, will plan for discharge this afternoon.   Patient to follow up in two weeks with Dr. Wynelle Link in clinic.   The PDMP database  was reviewed today prior to any opioid medications being prescribed to this patient.Fenton Foy, MBA, PA-C Orthopedic Surgery 07/20/2021, 8:37 AM

## 2021-07-20 NOTE — Telephone Encounter (Signed)
Yes, Justin [name] can reduce and wean off Keppra.  We reduce the dose to 250 mg bid, cutting a tablet in half for 14days , then take 250 mg once a day ( at night) for 14 days , then d/c the medication.  I read through Jerrol Banana, PhD , recommendation and he agrees with a vascular dementia being most likely present.  [name], MD

## 2021-07-20 NOTE — TOC Transition Note (Signed)
Transition of Care [facility]) - CM/SW Discharge Note   Patient Details  Name: NEILSON OEHLERT MRN: [id number] Date of Birth: [birth year]  [facility] CM/SW Contact:  Lennart Pall, LCSW Phone Number: 07/20/2021, 2:58 PM   Clinical Narrative:    Met with pt and wife today and confirming he has all needed DME at home. Plan for OPPT via [facility].  No further TOC needs.   Final next level of care: OP Rehab Barriers to Discharge: No Barriers Identified   Patient Goals and CMS Choice Patient states their goals for this hospitalization and ongoing recovery are:: return home      Discharge Placement                       Discharge Plan and Services                DME Arranged: N/A DME Agency: NA                  Social Determinants of Health (SDOH) Interventions     Readmission Risk Interventions No flowsheet data found.

## 2021-07-20 NOTE — Plan of Care (Signed)
Plan of care reviewed and discussed with the patient and his wife.

## 2021-08-14 ENCOUNTER — Other Ambulatory Visit: Payer: Self-pay | Admitting: Neurology

## 2021-08-20 ENCOUNTER — Other Ambulatory Visit: Payer: Self-pay

## 2021-08-20 ENCOUNTER — Ambulatory Visit (INDEPENDENT_AMBULATORY_CARE_PROVIDER_SITE_OTHER): Payer: Medicare Other

## 2021-08-20 VITALS — BP 120/76 | HR 72 | Ht 73.0 in | Wt 201.2 lb

## 2021-08-20 DIAGNOSIS — Z0000 Encounter for general adult medical examination without abnormal findings: Secondary | ICD-10-CM | POA: Diagnosis not present

## 2021-08-20 NOTE — Progress Notes (Signed)
Subjective:   Justin [name] is a 65 y.o. male who presents for an Initial Medicare Annual Wellness Visit.  Review of Systems     Cardiac Risk Factors include: advanced age (>49mn, >>40women);hypertension;sedentary lifestyle;dyslipidemia     Objective:    Today's Vitals   08/20/21 1033 08/20/21 1040  BP: 120/76   Pulse: 72   SpO2: 98%   Weight: 201 lb 3.2 oz (91.3 kg)   Height: 6' 1"  (1.854 m)   PainSc:  7    Body mass index is 26.55 kg/m.  Advanced Directives 08/20/2021 07/19/2021 07/08/2021 04/30/2021 01/31/2019 01/30/2019 01/30/2019  Does Patient Have a Medical Advance Directive? No No No No No No No  Type of Advance Directive - - - - - - -  Does patient want to make changes to medical advance directive? - Yes (MAU/Ambulatory/Procedural Areas - Information given) Yes (MAU/Ambulatory/Procedural Areas - Information given) - - - -  Copy of [place] Chart? - - - - - - -  Would patient like information on creating a medical advance directive? No - Patient declined No - Patient declined - No - Patient declined No - Patient declined No - Patient declined -    Current Medications (verified) Outpatient Encounter Medications as of 08/20/2021  Medication Sig   acetaminophen (TYLENOL) 500 MG tablet Take 500 mg by mouth every 6 (six) hours as needed (pain).   atorvastatin (LIPITOR) 40 MG tablet TAKE ONE TABLET BY MOUTH AT 6PM   [employer] B-12 500 MCG tablet Take 500 mcg by mouth daily.   diphenhydrAMINE (BENADRYL) 25 MG tablet Take 25 mg by mouth daily as needed for allergies.   donepezil (ARICEPT) 5 MG tablet TAKE 1 TABLET BY MOUTH EVERYDAY AT BEDTIME   escitalopram (LEXAPRO) 20 MG tablet TAKE ONE TABLET (20MG TOTAL) BY MOUTH ATBEDTIME   losartan (COZAAR) 100 MG tablet Take 100 mg by mouth daily.   Mesalamine (DELZICOL) 400 MG CPDR DR capsule Take 2 capsules (800 mg total) by mouth 2 (two) times daily.   methocarbamol (ROBAXIN) 500 MG tablet Take 1 tablet (500 mg total)  by mouth every 6 (six) hours as needed for muscle spasms.   oxyCODONE (OXY IR/ROXICODONE) 5 MG immediate release tablet Take 1-2 tablets (5-10 mg total) by mouth every 6 (six) hours as needed for severe pain.   pantoprazole (PROTONIX) 40 MG tablet TAKE 1 TABLET BY MOUTH EVERY DAY   [DISCONTINUED] levETIRAcetam (KEPPRA) 500 MG tablet Take 1 tablet (500 mg total) by mouth 2 (two) times daily.   [DISCONTINUED] rivaroxaban (XARELTO) 10 MG TABS tablet Take one 124mtablet Xarelto once a day for three weeks following surgery.  Then restart 8189mspirin once daily.   No facility-administered encounter medications on file as of 08/20/2021.    Allergies (verified) Patient has no known allergies.   History: Past Medical History:  Diagnosis Date   Acid reflux 06/88/41/6606Acute metabolic encephalopathy 05/07/15/0109Anxiety    Arthritis    Cerebral embolism with cerebral infarction 04/15/2018   Chest pain 05/19/2015   Depression    EEG abnormality without seizure 05/19/2021   Encephalitis 04/30/2021   Encephalopathy due to COVID-19 virus    Episodic confusion 05/19/2021   Heart murmur    Hyperglycemia 05/19/2015   Hypertension    Left thalamic lacunar infarct 05/24/2018   OSA (obstructive sleep apnea)    cpap 9 cm H2O   Recurrent unilateral inguinal hernia with obstruction and without  gangrene    SBO (small bowel obstruction)    Sleep apnea 3 years ago   Ulcerative colitis 09/24/2019   Vascular dementia without behavioral disturbance 05/19/2021   Past Surgical History:  Procedure Laterality Date   BIOPSY  06/21/2017   Procedure: BIOPSY;  Surgeon: Rogene Houston, MD;  Location: [facility];  Service: Endoscopy;;  rectal, righta and left colon;   COLONOSCOPY N/A 06/21/2017   Procedure: COLONOSCOPY;  Surgeon: Rogene Houston, MD;  Location: [facility];  Service: Endoscopy;  Laterality: N/A;  1:25   HERNIA REPAIR  3 years ago   INGUINAL HERNIA REPAIR Right 04/04/2018    Procedure: HERNIA REPAIR INGUINAL ADULT WITH MESH;  Surgeon: Aviva Signs, MD;  Location: [facility];  Service: General;  Laterality: Right;   INGUINAL HERNIA REPAIR Right 01/31/2019   Procedure: RECURRENT RIGHT INGUINAL HERNIORRHAPY WITH MESH, INCARCERATED;  Surgeon: Aviva Signs, MD;  Location: [facility];  Service: General;  Laterality: Right;   JOINT REPLACEMENT  July 19, 2021   Total knee repl.   KNEE SURGERY     tore mcl in high school (70's)   TOTAL KNEE ARTHROPLASTY Left 07/19/2021   Procedure: TOTAL KNEE ARTHROPLASTY;  Surgeon: Gaynelle Arabian, MD;  Location: [facility];  Service: Orthopedics;  Laterality: Left;   Family History  Problem Relation Age of Onset   Tuberculosis Mother    Stroke Mother    Arthritis Mother    Cancer Mother    Heart disease Father        died at 40   Early death Father    Bradycardia Brother        pacemaker   Stroke Brother    Hypertension Brother    Mental illness Paternal Grandfather        suicide at 3   Suicidality Paternal Grandfather    Cancer Paternal Uncle    Dementia Maternal Uncle 57   Colon cancer Neg Hx    Social History   Socioeconomic History   Marital status: Married    Spouse name: [name]   Number of children: Not on file   Years of education: 12   Highest education level: High school graduate  Occupational History   Occupation: Unemployed/retired    Comment: Clinical biochemist  Tobacco Use   Smoking status: Never   Smokeless tobacco: Never  Vaping Use   Vaping Use: Never used  Substance and Sexual Activity   Alcohol use: No   Drug use: No   Sexual activity: Yes    Birth control/protection: None  Other Topics Concern   Not on file  Social History Narrative   Lives with wife   Social Determinants of Health   Financial Resource Strain: Low Risk    Difficulty of Paying Living Expenses: Not hard at all  Food Insecurity: No Food Insecurity   Worried About Charity fundraiser in the Last Year: Never true   [place]  in the Last Year: Never true  Transportation Needs: No Transportation Needs   Lack of Transportation (Medical): No   Lack of Transportation (Non-Medical): No  Physical Activity: Sufficiently Active   Days of Exercise per Week: 3 days   Minutes of Exercise per Session: 60 min  Stress: No Stress Concern Present   Feeling of Stress : Not at all  Social Connections: Socially Integrated   Frequency of Communication with Friends and Family: More than three times a week   Frequency of Social Gatherings with Friends and  Family: More than three times a week   Attends Religious Services: More than 4 times per year   Active Member of Clubs or Organizations: Yes   Attends Music therapist: More than 4 times per year   Marital Status: Married    Tobacco Counseling Counseling given: Not Answered   Clinical Intake:  Pre-visit preparation completed: Yes  Pain : 0-10 Pain Score: 7  Pain Type: Chronic pain Pain Location: Knee Pain Descriptors / Indicators: Aching Pain Onset: More than a month ago Pain Frequency: Intermittent     BMI - recorded: 26.55 Nutritional Status: BMI 25 -29 Overweight Nutritional Risks: None Diabetes: No  How often do you need to have someone help you when you read instructions, pamphlets, or other written materials from your doctor or pharmacy?: 1 - Never  Diabetic?NO  Interpreter Needed?: No  Information entered by :: MJ Jag Lenz, LPN   Activities of Daily Living In your present state of health, do you have any difficulty performing the following activities: 08/20/2021 07/19/2021  Hearing? N -  Vision? N -  Difficulty concentrating or making decisions? Y -  Walking or climbing stairs? N -  Dressing or bathing? N -  Doing errands, shopping? N N  Preparing Food and eating ? N -  Using the Toilet? N -  In the past six months, have you accidently leaked urine? N -  Do you have problems with loss of bowel control? N -  Managing your  Medications? N -  Managing your Finances? N -  Housekeeping or managing your Housekeeping? N -  Some recent data might be hidden    Patient Care Team: [name], MD as PCP - General (Family Medicine)  Indicate any recent Medical Services you may have received from other than [facility] providers in the past year (date may be approximate).     Assessment:   This is a routine wellness examination for Aqeel.  Hearing/Vision screen Hearing Screening - Comments:: No hearing issues. Vision Screening - Comments:: Glasses. [facility]. 2021.  Dietary issues and exercise activities discussed: Current Exercise Habits: Structured exercise class, Type of exercise: stretching;Other - see comments (PT after knee surgery.), Time (Minutes): 60, Frequency (Times/Week): 3, Weekly Exercise (Minutes/Week): 180, Intensity: Mild, Exercise limited by: cardiac condition(s);neurologic condition(s);orthopedic condition(s)   Goals Addressed             This Visit's Progress    Exercise 3x per week (30 min per time)       Currently in PT after knee surgery, would like to be walk 2 miles daily.        Depression Screen PHQ 2/9 Scores 08/20/2021 12/21/2020 05/18/2020 01/14/2020 01/14/2020 09/26/2019 05/28/2018  PHQ - 2 Score 1 0 0 1 1 2  0  PHQ- 9 Score 2 - 6 5 - 5 -    Fall Risk Fall Risk  08/20/2021 12/21/2020 08/23/2018 05/28/2018  Falls in the past year? 0 0 No No  Number falls in past yr: 0 0 - -  Injury with Fall? 0 0 - -  Risk for fall due to : Impaired mobility - - -  Follow up Falls prevention discussed - - -    FALL RISK PREVENTION PERTAINING TO THE HOME:  Any stairs in or around the home? Yes  If so, are there any without handrails? No  Home free of loose throw rugs in walkways, pet beds, electrical cords, etc? Yes  Adequate lighting in your home to reduce risk of falls?  Yes   ASSISTIVE DEVICES UTILIZED TO PREVENT FALLS:  Life alert? No  Use of a cane, walker or w/c? Yes  Grab bars in  the bathroom? Yes  Shower chair or bench in shower? Yes  Elevated toilet seat or a handicapped toilet? Yes   TIMED UP AND GO:  Was the test performed? Yes .  Length of time to ambulate 10 feet: 10 sec.   Gait slow and steady without use of assistive device  Cognitive Function: MMSE - Mini Mental State Exam 05/19/2021 03/29/2021 09/28/2020 05/18/2020 12/25/2019  Not completed: - - - - (No Data)  Orientation to time 2 3 4 2 3   Orientation to Place 4 5 5 5 5   Registration 3 3 3 3 3   Attention/ Calculation 1 5 2 3 5   Recall 0 0 0 0 1  Language- name 2 objects 2 2 2 2 2   Language- repeat 1 1 1 1 1   Language- follow 3 step command 2 3 3 3 3   Language- read & follow direction 0 1 1 1 1   Write a sentence 1 1 1 1 1   Copy design 0 0 0 1 1  Copy design-comments - - 6 animals - -  Total score 16 24 22 22 26      6CIT Screen 08/20/2021  What Year? 0 points  What month? 0 points  What time? 0 points  Count back from 20 0 points  Months in reverse 4 points  Repeat phrase 6 points  Total Score 10    Immunizations Immunization History  Administered Date(s) Administered   Influenza Split 09/05/2015   Influenza,inj,Quad PF,6+ Mos 09/13/2018   Influenza,inj,quad, With Preservative 09/07/2017, 09/13/2018, 08/17/2019   Influenza,trivalent, recombinat, inj, PF 08/05/2017   Tdap 11/07/2008    TDAP status: Due, Education has been provided regarding the importance of this vaccine. Advised may receive this vaccine at local pharmacy or Health Dept. Aware to provide a copy of the vaccination record if obtained from local pharmacy or Health Dept. Verbalized acceptance and understanding.  Flu Vaccine status: Due, Education has been provided regarding the importance of this vaccine. Advised may receive this vaccine at local pharmacy or Health Dept. Aware to provide a copy of the vaccination record if obtained from local pharmacy or Health Dept. Verbalized acceptance and understanding.  Pneumococcal  vaccine status: Due, Education has been provided regarding the importance of this vaccine. Advised may receive this vaccine at local pharmacy or Health Dept. Aware to provide a copy of the vaccination record if obtained from local pharmacy or Health Dept. Verbalized acceptance and understanding.  Covid-19 vaccine status: Information provided on how to obtain vaccines.   Qualifies for Shingles Vaccine? Yes   Zostavax completed No   Shingrix Completed?: No.    Education has been provided regarding the importance of this vaccine. Patient has been advised to call insurance company to determine out of pocket expense if they have not yet received this vaccine. Advised may also receive vaccine at local pharmacy or Health Dept. Verbalized acceptance and understanding.  Screening Tests Health Maintenance  Topic Date Due   Zoster Vaccines- Shingrix (1 of 2) Never done   TETANUS/TDAP  11/07/2018   INFLUENZA VACCINE  06/07/2021   Hepatitis C Screening  05/18/2022 (Originally 05/06/1974)   COLONOSCOPY (Pts 45-26yr Insurance coverage will need to be confirmed)  06/22/2027   HPV VACCINES  Aged Out   COVID-19 Vaccine  Discontinued    Health Maintenance  Health Maintenance Due  Topic  Date Due   Zoster Vaccines- Shingrix (1 of 2) Never done   TETANUS/TDAP  11/07/2018   INFLUENZA VACCINE  06/07/2021    Colorectal cancer screening: Type of screening: Colonoscopy. Completed 06/21/2017. Repeat every 10 years  Lung Cancer Screening: (Low Dose CT Chest recommended if Age 8-80 years, 30 pack-year currently smoking OR have quit w/in 15years.) does not qualify.     Additional Screening:  Hepatitis C Screening: does qualify; Completed Postponed until 05/18/2022.  Vision Screening: Recommended annual ophthalmology exams for early detection of glaucoma and other disorders of the eye. Is the patient up to date with their annual eye exam?  No  Who is the provider or what is the name of the office in which  the patient attends annual eye exams? [facility] If pt is not established with a provider, would they like to be referred to a provider to establish care? No .   Dental Screening: Recommended annual dental exams for proper oral hygiene  Community Resource Referral / Chronic Care Management: CRR required this visit?  No   CCM required this visit?  No      Plan:     I have personally reviewed and noted the following in the patient's chart:   Medical and social history Use of alcohol, tobacco or illicit drugs  Current medications and supplements including opioid prescriptions. Patient is currently taking opioid prescriptions. Information provided to patient regarding non-opioid alternatives. Patient advised to discuss non-opioid treatment plan with their provider. Functional ability and status Nutritional status Physical activity Advanced directives List of other physicians Hospitalizations, surgeries, and ER visits in previous 12 months Vitals Screenings to include cognitive, depression, and falls Referrals and appointments  In addition, I have reviewed and discussed with patient certain preventive protocols, quality metrics, and best practice recommendations. A written personalized care plan for preventive services as well as general preventive health recommendations were provided to patient.     Chriss Driver, LPN   [id number]   Nurse Notes: Accompanied by wife. S/P L knee surgery. Doing well per pt. Discussed vaccines and ptt has delayed vaccines due to recent health issues in the last 3 years. Up to date on colonoscopy. 6CIT of 10.

## 2021-08-20 NOTE — Patient Instructions (Signed)
Justin [name] , Thank you for taking time to come for your Medicare Wellness Visit. I appreciate your ongoing commitment to your health goals. Please review the following plan we discussed and let me know if I can assist you in the future.   Screening recommendations/referrals: Colonoscopy: Done 06/21/2017 Repeat in 10 years  Recommended yearly ophthalmology/optometry visit for glaucoma screening and checkup Recommended yearly dental visit for hygiene and checkup  Vaccinations: Influenza vaccine: Done 08/17/2019 Repeat annually  Pneumococcal vaccine: Delayed due to health issues. Tdap vaccine: Done 09/20/2011 Repeat in 10 years  Shingles vaccine: Shingrix discussed. Please contact your pharmacy for coverage information.     Covid-19: Delayed due to health issues.  Advanced directives: Please bring a copy of your health care power of attorney and living will to the office to be added to your chart at your convenience.   Conditions/risks identified: Aim for 30 minutes of exercise or  walking each day, drink 6-8 glasses of water and eat lots of fruits and vegetables.   Next appointment: Follow up in one year for your annual wellness visit. 2023  Preventive Care 65 Years and Older, Male  Preventive care refers to lifestyle choices and visits with your health care provider that can promote health and wellness. What does preventive care include? A yearly physical exam. This is also called an annual well check. Dental exams once or twice a year. Routine eye exams. Ask your health care provider how often you should have your eyes checked. Personal lifestyle choices, including: Daily care of your teeth and gums. Regular physical activity. Eating a healthy diet. Avoiding tobacco and drug use. Limiting alcohol use. Practicing safe sex. Taking low doses of aspirin every day. Taking vitamin and mineral supplements as recommended by your health care provider. What happens during an annual well  check? The services and screenings done by your health care provider during your annual well check will depend on your age, overall health, lifestyle risk factors, and family history of disease. Counseling  Your health care provider may ask you questions about your: Alcohol use. Tobacco use. Drug use. Emotional well-being. Home and relationship well-being. Sexual activity. Eating habits. History of falls. Memory and ability to understand (cognition). Work and work Statistician. Screening  You may have the following tests or measurements: Height, weight, and BMI. Blood pressure. Lipid and cholesterol levels. These may be checked every 5 years, or more frequently if you are over 94 years old. Skin check. Lung cancer screening. You may have this screening every year starting at age 7 if you have a 30-pack-year history of smoking and currently smoke or have quit within the past 15 years. Fecal occult blood test (FOBT) of the stool. You may have this test every year starting at age 72. Flexible sigmoidoscopy or colonoscopy. You may have a sigmoidoscopy every 5 years or a colonoscopy every 10 years starting at age 50. Prostate cancer screening. Recommendations will vary depending on your family history and other risks. Hepatitis C blood test. Hepatitis B blood test. Sexually transmitted disease (STD) testing. Diabetes screening. This is done by checking your blood sugar (glucose) after you have not eaten for a while (fasting). You may have this done every 1-3 years. Abdominal aortic aneurysm (AAA) screening. You may need this if you are a current or former smoker. Osteoporosis. You may be screened starting at age 47 if you are at high risk. Talk with your health care provider about your test results, treatment options, and if necessary, the  need for more tests. Vaccines  Your health care provider may recommend certain vaccines, such as: Influenza vaccine. This is recommended every  year. Tetanus, diphtheria, and acellular pertussis (Tdap, Td) vaccine. You may need a Td booster every 10 years. Zoster vaccine. You may need this after age 52. Pneumococcal 13-valent conjugate (PCV13) vaccine. One dose is recommended after age 72. Pneumococcal polysaccharide (PPSV23) vaccine. One dose is recommended after age 26. Talk to your health care provider about which screenings and vaccines you need and how often you need them. This information is not intended to replace advice given to you by your health care provider. Make sure you discuss any questions you have with your health care provider. Document Released: 11/20/2015 Document Revised: 07/13/2016 Document Reviewed: 08/25/2015 Elsevier Interactive Patient Education  2017 [place] Prevention in the Home Falls can cause injuries. They can happen to people of all ages. There are many things you can do to make your home safe and to help prevent falls. What can I do on the outside of my home? Regularly fix the edges of walkways and driveways and fix any cracks. Remove anything that might make you trip as you walk through a door, such as a raised step or threshold. Trim any bushes or trees on the path to your home. Use bright outdoor lighting. Clear any walking paths of anything that might make someone trip, such as rocks or tools. Regularly check to see if handrails are loose or broken. Make sure that both sides of any steps have handrails. Any raised decks and porches should have guardrails on the edges. Have any leaves, snow, or ice cleared regularly. Use sand or salt on walking paths during winter. Clean up any spills in your garage right away. This includes oil or grease spills. What can I do in the bathroom? Use night lights. Install grab bars by the toilet and in the tub and shower. Do not use towel bars as grab bars. Use non-skid mats or decals in the tub or shower. If you need to sit down in the shower, use a  plastic, non-slip stool. Keep the floor dry. Clean up any water that spills on the floor as soon as it happens. Remove soap buildup in the tub or shower regularly. Attach bath mats securely with double-sided non-slip rug tape. Do not have throw rugs and other things on the floor that can make you trip. What can I do in the bedroom? Use night lights. Make sure that you have a light by your bed that is easy to reach. Do not use any sheets or blankets that are too big for your bed. They should not hang down onto the floor. Have a firm chair that has side arms. You can use this for support while you get dressed. Do not have throw rugs and other things on the floor that can make you trip. What can I do in the kitchen? Clean up any spills right away. Avoid walking on wet floors. Keep items that you use a lot in easy-to-reach places. If you need to reach something above you, use a strong step stool that has a grab bar. Keep electrical cords out of the way. Do not use floor polish or wax that makes floors slippery. If you must use wax, use non-skid floor wax. Do not have throw rugs and other things on the floor that can make you trip. What can I do with my stairs? Do not leave any items on the  stairs. Make sure that there are handrails on both sides of the stairs and use them. Fix handrails that are broken or loose. Make sure that handrails are as long as the stairways. Check any carpeting to make sure that it is firmly attached to the stairs. Fix any carpet that is loose or worn. Avoid having throw rugs at the top or bottom of the stairs. If you do have throw rugs, attach them to the floor with carpet tape. Make sure that you have a light switch at the top of the stairs and the bottom of the stairs. If you do not have them, ask someone to add them for you. What else can I do to help prevent falls? Wear shoes that: Do not have high heels. Have rubber bottoms. Are comfortable and fit you  well. Are closed at the toe. Do not wear sandals. If you use a stepladder: Make sure that it is fully opened. Do not climb a closed stepladder. Make sure that both sides of the stepladder are locked into place. Ask someone to hold it for you, if possible. Clearly mark and make sure that you can see: Any grab bars or handrails. First and last steps. Where the edge of each step is. Use tools that help you move around (mobility aids) if they are needed. These include: Canes. Walkers. Scooters. Crutches. Turn on the lights when you go into a dark area. Replace any light bulbs as soon as they burn out. Set up your furniture so you have a clear path. Avoid moving your furniture around. If any of your floors are uneven, fix them. If there are any pets around you, be aware of where they are. Review your medicines with your doctor. Some medicines can make you feel dizzy. This can increase your chance of falling. Ask your doctor what other things that you can do to help prevent falls. This information is not intended to replace advice given to you by your health care provider. Make sure you discuss any questions you have with your health care provider. Document Released: 08/20/2009 Document Revised: 03/31/2016 Document Reviewed: 11/28/2014 Elsevier Interactive Patient Education  2017 [employer].

## 2021-09-02 ENCOUNTER — Other Ambulatory Visit: Payer: Self-pay | Admitting: Family Medicine

## 2021-09-06 ENCOUNTER — Ambulatory Visit: Payer: Medicare Other | Admitting: Psychology

## 2021-09-10 ENCOUNTER — Other Ambulatory Visit (INDEPENDENT_AMBULATORY_CARE_PROVIDER_SITE_OTHER): Payer: Self-pay | Admitting: Internal Medicine

## 2021-09-23 NOTE — Progress Notes (Signed)
Provider:  [name], [name]  Primary Care Physician:  [name], MD  Reason for visit: Office follow-up   Chief Complaint  Patient presents with   Follow-up    Rm 2 with wife here for 6 month f/u- Reports he has been doing ok since his last visit no new concerns.      HPI:   Update 09/27/2021 JM: Returns for 19-monthfollow-up accompanied by his wife who provides majority of history  Vascular dementia: Stable per wife - can have increased difficulty with overstimulation or with increased anxiety He has not worked since May 2022. He has also refrain from driving - wife questions his ability to return to driving as he is requesting to do so MMSE today 20/30 (prior 16/30 05/2021) -previously completed during f/u visit with Dr. DBrett Fairyfor hospital follow-up after acute mental status change on 04/30/2021 possibly in setting of encephalopathy due to autoimmune encephalitis vs less likely seizures (see OV note from 05/19/2021). Repeat MRI showed unchanged abnormality in anterior left temporal lobe possibly remote infarct vs encephalitis vs low-grade glioma (per report).  Repeat EEG 06/09/2021 no evidence of seizure activity and slowly discontinue Keppra. Dr. DBrett Fairyinitiated Aricept 5 mg daily which he has been tolerating without side effects Eval by Dr. MMelvyn Novas8/2022 with likely vascular dementia likely exacerbated by history of COVID-19 and acute metabolic encephalopathy due to suspected encephalitis. Advised he refrain from driving and working. Recommended repeat eval in 12 months  No behavioral concerns   OSA on CPAP: 30-day compliance report shows 30 out of 30 usage days with only 7 days greater than 4 hours with average usage 3 hours and 0 minutes.  Residual AHI 1.3.  Leaks in the 95th percentile 13.9.  Set pressure 9 and EPR level 3. Continues to tolerate well but will wake up in the middle of the night to either go to the bathroom or to sit in recliner and upon  returning back to bed, he will forget to put back on. Up to date on supplies provided through [facility].  Previously discussed inspire device - not a good candidate due to needing anesthesia (per Dr. DBrett Fairy.    History of stroke: Stable in regards to stroke aspect.  Denies new stroke/TIA symptoms.  Compliant on aspirin and atorvastatin -denies side effects.  Blood pressure today 136/74.     Other  S/p left total knee arthroplasty 07/19/2021 - working with PT - has 2 more sessions. Wife mentioned physical therapist recommended cognitive therapy initially but unsure if they still recommend this.  She plans on further discussing at next visit.    No further concerns at this time    History obtained for reference purposes only Update 03/29/2021 JM:   Mr. HAllertonreturns for 651-monthtroke and sleep apnea follow-up accompanied by his wife.  Wife sent MyChart message prior to todays visit with concerns regarding continued decline in his cognition especially with short-term memory and processing of information.  Remains on Lexapro for prior increased agitation and irritation.  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  This concerns his wife as she does not feel he should be still working in setting of his continued cognitive decline.  MMSE today 24/30 (prior 22/30).  Of note, he was diagnosed with COVID-19 12/03/2020 and prior COVID 19 06/2020. He did recover well from most  recent case and does not believe any worsening of cognition denies residual deficits.  Compliant on aspirin and atorvastatin without associated side effects.  Blood pressure today 111/70.   CPAP compliance report from the past 30 days shows 30 out of 30 usage days but only 15 days greater than 4 hours with average usage 4 hours and 2 minutes.  Residual AHI 0.9.  Per wife, will sleep for a couple hours prior to going to bed in recliner then  will sleep in bed for 2-4 hours then go back to his recliner and sleep additional time.  He questions possible use of inspire device  Plans on undergoing knee replacement - sees ortho Thursday to discuss further   No further concerns at this time   Update 09/28/2020 JM: Mr. Goldfarb returns for 74-monthfollow-up regarding history of stroke and cognitive decline.  Since prior visit, he was diagnosed with COVID 8/30 and wife has noticed slight worsening of cognition since that time but overall stable. MMSE today 22/30 (prior 22/30). MRI, EEG and lab work all unremarkable for reversible causes or abnormality. 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 Wife has been assisting with this. Does report occasional increased agitation or irritation but has been able to better manage this. Currently on lexapro 2105mdaily. Denies new or recurrent stroke/TIA symptoms remaining on aspirin 325 mg daily and atorvastatin 40 mg daily without side effects.  Blood pressure today 110/68. History of OSA on CPAP with difficulty recently due to possibly walking pneumonia recently completing treatment. Review of compliance report from 08/29/2020 -09/27/2020 shows 29 out of 30 usage days for 97% compliance with only 19 days greater than 4 hours for 63% compliance. Average usage 4 hours and 18 minutes. Residual AHI 2.0 on set pressure 9 cm H2O and EPR level 3. He does report slowly increasing his tolerance as he had difficulty using towards the end of October and beginning of November. Continues to follow with DME company [employer] for any needed supplies. No further concerns.  Update 05/18/2020 JM: Mr. HiBucheturns for follow-up accompanied by his wife. Prior concerns of short-term memory loss gradually worsening since his stroke in 05/2018.  Evidence of  depression/anxiety and initiated Lexapro 10 mg daily with improvement.  He does continue to experience occasional agitation/irritability as well as occasional short-term memory deficit as well as difficulty in multitasking and focusing.  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  He also reports intermittent word finding difficulty which has been more noticeable per wife.  He does report increased fatigue taking naps after work as well as difficulty with insomnia.  Ongoing compliance with CPAP for OSA management -download report told to ensure optimal residual AHI which showed excellent compliance with residual AHI 0.9.  Continues on aspirin 325 mg daily and atorvastatin 40 mg daily for secondary stroke prevention.  Blood pressure today satisfactory 125/77.  No further concerns at this time.  Update 01/14/2020 JM: Mr. HiKnippenbergs a [age]ear old male presenting today, 01/14/2020 for a follow up with his wife present. He was last seen on 09/26/2019 for worsening short-term memory loss, which he and his wife feel has improved. He was started on lexapro 1022mue to concerns of underlying depression/anxiety affecting memory with improvement of underlying depression/anxiety.  Most recent MMSE  26/30 on 12/25/2019 with prior 23/30.  He was previously being followed in this office due to left thalamic stroke in 04/2018 with residual right hemisensory impairment. He has continued on aspirin and atorvastatin for secondary stroke prevention. Overall patient and wife feel like his depression, anxiety and memory impairment have improved.  Recently had follow-up with Judson Roch, NP for sleep apnea and endorses ongoing compliance.  Continues on aspirin and atorvastatin for secondary stroke prevention. Blood pressure today satisfactory 125/79. No further concerns at this time.   Update 09/26/2019: Mr. Mcmahill is a 65 year old male who is being seen per patient/wife request due to worsening short-term memory  loss.  He was previously being followed in this office due to left thalamic stroke in 04/2018 with residual right hemisensory impairment.  He has continued on aspirin and atorvastatin for secondary stroke prevention.  Recent lipid panel showed LDL 61.  Wife has been observing occasional short-term memory concerns over the past few months.  He has continued working and functioning without difficulty but he will occasionally forget certain conversations, triple check to ensure he is completed different tasks and forgetting certain dates.  He does endorse increased stress over the past few months at work where he has no disease last patient with his workers and increased irritability.  He is also been experiencing worsening insomnia and has been having greater difficulty tolerating CPAP mask.  He does have a prior history of anxiety.  He was previously treated on Xanax several years prior. MMSE 23/30 greatest in recall, repetition, and drawing.  GAD-7 score 4.  PHQ 9 score 5.  After further discussing possible depression/anxiety causing concerns of memory, wife has noticed patient being more depressed recently.  Reviewing his medication list, he will take Benadryl occasionally as needed to help with sleep.  Interval history 08/23/2018: Patient returns today for stroke follow-up visit.  He did undergo 30-day cardiac monitor which did show episode of V. tach with heart rate 178 but negative for atrial fibrillation.  Upon initial evaluation in the ED, he reported numbness of the right face, right upper extremity and right lower extremity.  Patient does state that he continues to have right upper extremity numbness that is constant with a "stiff and tight feeling".  He has continued to work despite his continued numbness but does have intermittent difficulties as he is right-hand dominant.  He also does have some memory concerns stating that he is not as "sharp" as he previously was and will ask the same questions or will  forget certain directions.  He continues to take aspirin without side effects of bleeding or bruising.  Continues to take Lipitor without side effects myalgias.  Blood pressure today satisfactory 135/89.  He does continue to state compliance with CPAP but has been finding himself taking the mask off in the middle the night.  He does have a follow-up in this office for OSA management on 09/13/2018.  No further concerns at this time.  Denies new or worsening stroke/TIA symptoms.  05/24/2018 visit CD: Seen in STROKE follow up -Mr. Dishon Kehoe is a 65 year old male patient whom I had initially seen earlier this year for evaluation of sleep apnea unfortunately the patient underwent a right-sided inguinal hernia repair for which she had to discontinue his full size aspirin 5 days prior.  Also aspirin was removed within 2 or 3 days post surgery he did suffer a stroke.  And his main symptom was right-sided numbness and clumsiness sensory loss of the right dominant hand.  I see him today after he has undergone a stroke work-up in the hospital which included a head CT without contrast on 7 June, an MRI MRA of head and neck, the findings were an acute lacunar infarct in the left lateral thalamus there was no hemorrhage, he had negative abnormalities he was negative for abnormalities in the vascular tree his neck MRA showed mild atherosclerosis at the origins of the left internal carotid artery and right subclavian artery but not significant stenosis and he had very mild for age nonspecific white matter signal changes these are commonly seen the small vessel disease there was no brain atrophy noted.  He had presented to the hospital ED with elevated blood pressures which may explain the lacunar appearance of a previous stroke.  Head CT was negative.  Onset of symptoms was on 13 April 2018 he was evaluated by Dr. Kerney Elbe neurologist on-call, who also mentioned that the patient has a history of obstructive sleep apnea and is  currently treated at 9 cmH2O pressure.  The patient restarted on aspirin metabolic panels were normal his creatinine was 1.05 his BUN 24 which may indicate that he was slightly dehydrated, his CBC was normal but that the count was 9.6K hemoglobin 15.1 hematocrit 50.1 without any evidence of anemia normal platelet count 350 3K.  Cardiac enzymes were negative his total cholesterol was actually rather low at 133 ng but is good "" cholesterol HDL was only 30.      Review of Systems: Out of a complete 14 system review, the patient complains of only the following listed in HPI, and all other reviewed systems are negative.    Social History   Socioeconomic History   Marital status: Married    Spouse name: [name]   Number of children: Not on file   Years of education: 12   Highest education level: High school graduate  Occupational History   Occupation: Unemployed/retired    Comment: Clinical biochemist  Tobacco Use   Smoking status: Never   Smokeless tobacco: Never  Vaping Use   Vaping Use: Never used  Substance and Sexual Activity   Alcohol use: No   Drug use: No   Sexual activity: Yes    Birth control/protection: None  Other Topics Concern   Not on file  Social History Narrative   Lives with wife   Social Determinants of Health   Financial Resource Strain: Low Risk    Difficulty of Paying Living Expenses: Not hard at all  Food Insecurity: No Food Insecurity   Worried About Charity fundraiser in the Last Year: Never true   [place] in the Last Year: Never true  Transportation Needs: No Transportation Needs   Lack of Transportation (Medical): No   Lack of Transportation (Non-Medical): No  Physical Activity: Sufficiently Active   Days of Exercise per Week: 3 days   Minutes of Exercise per Session: 60 min  Stress: No Stress Concern Present   Feeling of Stress : Not at all  Social Connections: Socially Integrated   Frequency of Communication with Friends and Family: More  than three times a week   Frequency of Social Gatherings with Friends and Family: More than three times a week   Attends Religious Services: More than 4 times per year   Active Member of [employer] or Organizations: Yes   Attends Music therapist: More than 4 times per year   Marital Status: Married  Human resources officer Violence: Not At Risk  Fear of Current or Ex-Partner: No   Emotionally Abused: No   Physically Abused: No   Sexually Abused: No    Family History  Problem Relation Age of Onset   Tuberculosis Mother    Stroke Mother    Arthritis Mother    Cancer Mother    Heart disease Father        died at 89   Early death Father    Bradycardia Brother        pacemaker   Stroke Brother    Hypertension Brother    Mental illness Paternal Grandfather        suicide at 24   Suicidality Paternal Grandfather    Cancer Paternal Uncle    Dementia Maternal Uncle 93   Colon cancer Neg Hx     Past Medical History:  Diagnosis Date   Acid reflux 03/54/6568   Acute metabolic encephalopathy 12/75/1700   Anxiety    Arthritis    Cerebral embolism with cerebral infarction 04/15/2018   Chest pain 05/19/2015   Depression    EEG abnormality without seizure 05/19/2021   Encephalitis 04/30/2021   Encephalopathy due to COVID-19 virus    Episodic confusion 05/19/2021   Heart murmur    Hyperglycemia 05/19/2015   Hypertension    Left thalamic lacunar infarct 05/24/2018   OSA (obstructive sleep apnea)    cpap 9 cm H2O   Recurrent unilateral inguinal hernia with obstruction and without gangrene    SBO (small bowel obstruction)    Sleep apnea 3 years ago   Ulcerative colitis 09/24/2019   Vascular dementia without behavioral disturbance 05/19/2021    Past Surgical History:  Procedure Laterality Date   BIOPSY  06/21/2017   Procedure: BIOPSY;  Surgeon: Rogene Houston, MD;  Location: [facility];  Service: Endoscopy;;  rectal, righta and left colon;   COLONOSCOPY N/A  06/21/2017   Procedure: COLONOSCOPY;  Surgeon: Rogene Houston, MD;  Location: [facility];  Service: Endoscopy;  Laterality: N/A;  1:25   HERNIA REPAIR  3 years ago   INGUINAL HERNIA REPAIR Right 04/04/2018   Procedure: HERNIA REPAIR INGUINAL ADULT WITH MESH;  Surgeon: Aviva Signs, MD;  Location: [facility];  Service: General;  Laterality: Right;   INGUINAL HERNIA REPAIR Right 01/31/2019   Procedure: RECURRENT RIGHT INGUINAL HERNIORRHAPY WITH MESH, INCARCERATED;  Surgeon: Aviva Signs, MD;  Location: [facility];  Service: General;  Laterality: Right;   JOINT REPLACEMENT  July 19, 2021   Total knee repl.   KNEE SURGERY     tore mcl in high school (70's)   TOTAL KNEE ARTHROPLASTY Left 07/19/2021   Procedure: TOTAL KNEE ARTHROPLASTY;  Surgeon: Gaynelle Arabian, MD;  Location: [facility];  Service: Orthopedics;  Laterality: Left;    Current Outpatient Medications  Medication Sig Dispense Refill   acetaminophen (TYLENOL) 500 MG tablet Take 500 mg by mouth every 6 (six) hours as needed (pain).     atorvastatin (LIPITOR) 40 MG tablet TAKE ONE TABLET BY MOUTH AT 6PM 90 tablet 3   [facility] B-12 500 MCG tablet Take 500 mcg by mouth daily.     diphenhydrAMINE (BENADRYL) 25 MG tablet Take 25 mg by mouth daily as needed for allergies.     donepezil (ARICEPT) 5 MG tablet TAKE 1 TABLET BY MOUTH EVERYDAY AT BEDTIME 90 tablet 0   escitalopram (LEXAPRO) 20 MG tablet TAKE ONE TABLET (20MG TOTAL) BY MOUTH ATBEDTIME 90 tablet 0   losartan (COZAAR) 100 MG tablet TAKE ONE (1)  TABLET BY MOUTH EVERY DAY 90 tablet 2   Mesalamine (DELZICOL) 400 MG CPDR DR capsule Take 2 capsules (800 mg total) by mouth 2 (two) times daily. 360 capsule 3   pantoprazole (PROTONIX) 40 MG tablet Take 1 tablet (40 mg total) by mouth daily. NEEDS OFFICE VISIT 90 tablet 0   No current facility-administered medications for this visit.    Allergies as of 09/27/2021   (No Known Allergies)    Vitals: Today's Vitals   09/27/21 0856  BP:  136/74  Pulse: 62  SpO2: 97%  Weight: 201 lb (91.2 kg)  Height: 6' 1"  (1.854 m)    Body mass index is 26.52 kg/m.  Physical exam: General: well developed, well nourished, very pleasant middle-age Caucasian male, seated, in no evident distress Head: head normocephalic and atraumatic.   Neck: supple with no carotid or supraclavicular bruits Cardiovascular: regular rate and rhythm, no murmurs Musculoskeletal: L shoulder pain with limited ROM Skin:  no rash/petichiae Vascular:  Normal pulses all extremities   Neurologic Exam Mental Status: Awake and fully alert.   Fluent speech and language.  Oriented to place and time. Recent memory impaired and remote memory intact. Attention span, concentration and fund of knowledge appropriate during visit. Mood and affect appropriate.   MMSE - Mini Mental State Exam 09/27/2021 05/19/2021 03/29/2021  Not completed: - - -  Orientation to time 3 2 3   Orientation to Place 4 4 5   Registration 3 3 3   Attention/ Calculation 1 1 5   Recall 0 0 0  Language- name 2 objects 2 2 2   Language- repeat 1 1 1   Language- follow 3 step command 3 2 3   Language- read & follow direction 1 0 1  Write a sentence 1 1 1   Copy design 1 0 0  Copy design-comments - - -  Total score 20 16 24    Cranial Nerves:  Pupils equal, briskly reactive to light. Extraocular movements full without nystagmus. Visual fields full to confrontation. Hearing intact. Facial sensation intact. Face, tongue, palate moves normally and symmetrically.  Motor: Normal bulk and tone. Normal strength in all tested extremity muscles. Sensory.: intact to touch , pinprick , position and vibratory sensation.  Coordination: Rapid alternating movements normal in all extremities. Finger-to-nose and heel-to-shin performed accurately bilaterally. Gait and Station: Arises from chair without difficulty. Stance is normal. Gait demonstrates normal stride length and balance without use of assistive device Reflexes:  1+ and symmetric. Toes downgoing.       Assessment/plan: Bosten Newstrom is a 65 year old male with PMHx of left thalamic stroke 04/2018 likely secondary to small vessel disease, vascular dementia, HTN, HLD, OSA, COVID 19 x2, and metabolic encephalopathy likely in setting of encephalitis 04/2021    1.  Vascular dementia -MMSE 20/30 (prior 16/30)  -Neurocognitive eval 07/06/2021 - likely vascular dementia with prior stroke hx likely exacerbated by history of COVID-19 and acute metabolic encephalopathy due to suspected encephalitis -Continue Aricept 5 mg daily - no indication for increased dose at this time - refill provided -Continue Lexapro 20 mg daily - refill provided -Discussed importance of memory exercises, managing stroke risk factors, healthy diet, routine exercise and adequate sleep.  -provided information re: driving evaluations. No driving unless evaluations completed and cleared -plan to f/u with Dr. Melvyn Novas 06/2022 for repeat testing -advised to call office if they wish to proceed with cognitive therapy (see HPI) -repeat B12 today - continue supplementation   2. History of stroke -Continue aspirin 81 mg daily and atorvastatin 40  mg daily for secondary stroke prevention -Discussed secondary stroke prevention measures and importance of close follow-up with PCP for HTN and HLD management  -Repeat lipid panel today - request ongoing monitoring to PCP with testing every 6-12 months   3.  Obstructive sleep apnea -Recent compliance report shows poor compliance although adequate AHI -Discussed ways to help him remember to replace mask if he should get up in the middle of the night - due to vascular dementia, he will likely continue to have difficulty with this -Per Dr. Brett Fairy, not a candidate for hypoglossal nerve stimulator due to this requiring anesthesia   4.  Abnormal MRI -Repeat MRI brain w w/o contrast 06/07/2021 Unchanged hyperintense T2-weighted signal within the anterior left  temporal lobe. This may be due to remote infarct, encephalitis or a low-grade glioma. Continued follow-up imaging may be helpful to ensure stability. -plan to repeat around 12/2021 for monitoring     Follow-up in 6 months or call earlier if needed   CC:  [name], MD     I spent 54 minutes of face-to-face and non-face-to-face time with patient and wife.  This included previsit chart review, lab review, study review, order entry, electronic health record documentation, patient and wife education and discussion regarding vascular dementia and completion review of MMSE, history of prior stroke and secondary stroke prevention measures and aggressive stroke risk factor management, OSA and review and discussion regarding compliance report, prior abnormal MRI and indication for surveillance monitoring and answered all the questions to patient and wife satisfaction   Frann Rider, AGNP-BC  [facility] [address] [place], [place] [address]  Phone [phone] Fax [phone] Note: This document was prepared with digital dictation and possible smart phrase technology. Any transcriptional errors that result from this process are unintentional.

## 2021-09-27 ENCOUNTER — Other Ambulatory Visit: Payer: Self-pay

## 2021-09-27 ENCOUNTER — Ambulatory Visit (INDEPENDENT_AMBULATORY_CARE_PROVIDER_SITE_OTHER): Payer: Medicare Other | Admitting: Adult Health

## 2021-09-27 ENCOUNTER — Encounter: Payer: Self-pay | Admitting: Adult Health

## 2021-09-27 VITALS — BP 136/74 | HR 62 | Ht 73.0 in | Wt 201.0 lb

## 2021-09-27 DIAGNOSIS — Z9989 Dependence on other enabling machines and devices: Secondary | ICD-10-CM

## 2021-09-27 DIAGNOSIS — E785 Hyperlipidemia, unspecified: Secondary | ICD-10-CM

## 2021-09-27 DIAGNOSIS — F32A Depression, unspecified: Secondary | ICD-10-CM

## 2021-09-27 DIAGNOSIS — E538 Deficiency of other specified B group vitamins: Secondary | ICD-10-CM

## 2021-09-27 DIAGNOSIS — F0150 Vascular dementia without behavioral disturbance: Secondary | ICD-10-CM

## 2021-09-27 DIAGNOSIS — I6381 Other cerebral infarction due to occlusion or stenosis of small artery: Secondary | ICD-10-CM | POA: Diagnosis not present

## 2021-09-27 DIAGNOSIS — R9089 Other abnormal findings on diagnostic imaging of central nervous system: Secondary | ICD-10-CM

## 2021-09-27 DIAGNOSIS — F419 Anxiety disorder, unspecified: Secondary | ICD-10-CM

## 2021-09-27 DIAGNOSIS — G4733 Obstructive sleep apnea (adult) (pediatric): Secondary | ICD-10-CM

## 2021-09-27 MED ORDER — ESCITALOPRAM OXALATE 20 MG PO TABS: 20.0000 mg | ORAL_TABLET | Freq: Every day | ORAL | 3 refills | Status: DC

## 2021-09-27 MED ORDER — DONEPEZIL HCL 5 MG PO TABS: 5.0000 mg | ORAL_TABLET | Freq: Every day | ORAL | 3 refills | Status: DC

## 2021-09-27 NOTE — Patient Instructions (Signed)
We will check cholesterol levels and repeat B12 level today   We will plan on repeat MRI brain imaging around February to follow up on prior MRI  Increase duration of CPAP use - continue to follow with DME company for any needed supplies or CPAP related concerns  Continue aspirin 81 mg daily  and atorvastatin 76m daily  for secondary stroke prevention  Continue to follow up with PCP regarding cholesterol and blood pressure management  Maintain strict control of hypertension with blood pressure goal below 130/90 and cholesterol with LDL cholesterol (bad cholesterol) goal below 70 mg/dL.     Follow up in 6 months or call earlier if needed           Thank you for coming [facility]. I hope we have been able to provide you high quality care today.  You may receive a patient satisfaction survey over the next few weeks. We would appreciate your feedback and comments so that we may continue to improve ourselves and the health of our patients.

## 2021-09-28 LAB — LIPID PANEL
Chol/HDL Ratio: 3.1 ratio (ref 0.0–5.0)
Cholesterol, Total: 126 mg/dL (ref 100–199)
HDL: 41 mg/dL (ref >39)
LDL Chol Calc (NIH): 65 mg/dL (ref 0–99)
Triglycerides: 106 mg/dL (ref 0–149)
VLDL Cholesterol Cal: 20 mg/dL (ref 5–40)

## 2021-09-28 LAB — VITAMIN B12: Vitamin B-12: 722 pg/mL (ref 232–1245)

## 2021-09-29 ENCOUNTER — Telehealth: Payer: Self-pay | Admitting: Adult Health

## 2021-09-29 DIAGNOSIS — F32A Depression, unspecified: Secondary | ICD-10-CM

## 2021-09-29 DIAGNOSIS — F419 Anxiety disorder, unspecified: Secondary | ICD-10-CM

## 2021-09-29 MED ORDER — ESCITALOPRAM OXALATE 20 MG PO TABS: 20.0000 mg | ORAL_TABLET | Freq: Every day | ORAL | 3 refills | Status: DC

## 2021-09-29 NOTE — Telephone Encounter (Signed)
Pt's wife requesting a refill for escitalopram (LEXAPRO) 20 MG tablet. Pharmacy [id number] [place] [place]

## 2021-09-29 NOTE — Telephone Encounter (Signed)
Refill was sent on 09/28/21 but not received by pharmacy. Refill has been sent again.

## 2021-09-29 NOTE — Addendum Note (Signed)
Addended by: Verlin Grills on: 09/29/2021 01:01 PM   Modules accepted: Orders

## 2021-10-06 ENCOUNTER — Other Ambulatory Visit: Payer: Self-pay | Admitting: Neurology

## 2021-10-06 DIAGNOSIS — F419 Anxiety disorder, unspecified: Secondary | ICD-10-CM

## 2021-11-05 ENCOUNTER — Other Ambulatory Visit (INDEPENDENT_AMBULATORY_CARE_PROVIDER_SITE_OTHER): Payer: Self-pay | Admitting: *Deleted

## 2021-11-05 ENCOUNTER — Telehealth (INDEPENDENT_AMBULATORY_CARE_PROVIDER_SITE_OTHER): Payer: Self-pay | Admitting: *Deleted

## 2021-11-05 MED ORDER — MESALAMINE 400 MG PO CPDR: 800.0000 mg | DELAYED_RELEASE_CAPSULE | Freq: Two times a day (BID) | ORAL | 0 refills | Status: DC

## 2021-11-05 NOTE — Telephone Encounter (Signed)
Per dr Laural Golden pt needs office visit within next 3 months.

## 2021-11-29 ENCOUNTER — Other Ambulatory Visit: Payer: Self-pay | Admitting: Adult Health

## 2021-11-29 ENCOUNTER — Telehealth: Payer: Self-pay | Admitting: Adult Health

## 2021-11-29 DIAGNOSIS — R9089 Other abnormal findings on diagnostic imaging of central nervous system: Secondary | ICD-10-CM

## 2021-11-29 NOTE — Telephone Encounter (Signed)
Medicare/[employer] no auth req order sent to [facility] to be scheduled at [facility]

## 2021-12-09 ENCOUNTER — Other Ambulatory Visit (INDEPENDENT_AMBULATORY_CARE_PROVIDER_SITE_OTHER): Payer: Self-pay | Admitting: Internal Medicine

## 2021-12-29 ENCOUNTER — Other Ambulatory Visit (INDEPENDENT_AMBULATORY_CARE_PROVIDER_SITE_OTHER): Payer: Self-pay | Admitting: Internal Medicine

## 2022-01-04 ENCOUNTER — Telehealth (INDEPENDENT_AMBULATORY_CARE_PROVIDER_SITE_OTHER): Payer: Self-pay

## 2022-01-04 ENCOUNTER — Other Ambulatory Visit (INDEPENDENT_AMBULATORY_CARE_PROVIDER_SITE_OTHER): Payer: Self-pay

## 2022-01-04 DIAGNOSIS — K5100 Ulcerative (chronic) pancolitis without complications: Secondary | ICD-10-CM

## 2022-01-04 DIAGNOSIS — I10 Essential (primary) hypertension: Secondary | ICD-10-CM

## 2022-01-04 DIAGNOSIS — G4733 Obstructive sleep apnea (adult) (pediatric): Secondary | ICD-10-CM

## 2022-01-04 DIAGNOSIS — K56609 Unspecified intestinal obstruction, unspecified as to partial versus complete obstruction: Secondary | ICD-10-CM

## 2022-01-04 MED ORDER — SULFASALAZINE 500 MG PO TABS: 1000.0000 mg | ORAL_TABLET | Freq: Two times a day (BID) | ORAL | 0 refills | Status: DC

## 2022-01-04 MED ORDER — FOLIC ACID 1 MG PO TABS: 1.0000 mg | ORAL_TABLET | Freq: Every day | ORAL | 0 refills | Status: DC

## 2022-01-04 NOTE — Telephone Encounter (Signed)
[employer] will no longer cover Mesalamine 400 mg. Per Dr. Laural Golden have patient start on Sulfasalazine 1 gram bid and Folic Acid 1 mg Qd. I have sent these to [facility]. Patient wife aware of all.

## 2022-01-24 ENCOUNTER — Encounter (INDEPENDENT_AMBULATORY_CARE_PROVIDER_SITE_OTHER): Payer: Self-pay | Admitting: Gastroenterology

## 2022-01-24 ENCOUNTER — Ambulatory Visit (INDEPENDENT_AMBULATORY_CARE_PROVIDER_SITE_OTHER): Payer: Medicare PPO | Admitting: Gastroenterology

## 2022-01-24 ENCOUNTER — Other Ambulatory Visit: Payer: Self-pay

## 2022-01-24 VITALS — BP 133/89 | HR 68 | Temp 98.0°F | Ht 72.0 in | Wt 205.7 lb

## 2022-01-24 DIAGNOSIS — I10 Essential (primary) hypertension: Secondary | ICD-10-CM

## 2022-01-24 DIAGNOSIS — K5100 Ulcerative (chronic) pancolitis without complications: Secondary | ICD-10-CM | POA: Diagnosis not present

## 2022-01-24 DIAGNOSIS — K56609 Unspecified intestinal obstruction, unspecified as to partial versus complete obstruction: Secondary | ICD-10-CM | POA: Diagnosis not present

## 2022-01-24 DIAGNOSIS — K219 Gastro-esophageal reflux disease without esophagitis: Secondary | ICD-10-CM

## 2022-01-24 DIAGNOSIS — G4733 Obstructive sleep apnea (adult) (pediatric): Secondary | ICD-10-CM

## 2022-01-24 MED ORDER — PANTOPRAZOLE SODIUM 40 MG PO TBEC: 40.0000 mg | DELAYED_RELEASE_TABLET | Freq: Every day | ORAL | 3 refills | Status: DC

## 2022-01-24 MED ORDER — FOLIC ACID 1 MG PO TABS: 1.0000 mg | ORAL_TABLET | Freq: Every day | ORAL | 3 refills | Status: AC

## 2022-01-24 MED ORDER — SULFASALAZINE 500 MG PO TABS: 1000.0000 mg | ORAL_TABLET | Freq: Two times a day (BID) | ORAL | 3 refills | Status: DC

## 2022-01-24 NOTE — Progress Notes (Signed)
Justin [name], M.D. ?Gastroenterology & Hepatology ?[facility] ?[address] ?[place], [place] [address] ? ?Primary Care Physician: ?[name], MD ?[address] [address] ? ?I will communicate my assessment and recommendations to the referring MD via EMR. ? ?Problems: ?Ulcerative pancolitis on sulfasalazine ?GERD ? ?History of Present Illness: ?Justin [name] is a 66 y.o. male with past medical history of previous stroke, GERD, ulcerative colitis, vascular dementia, hypertension, hyperlipidemia, who presents for follow up of GERD and UC. ? ?The patient was last seen on 09/29/2020. At that time, the patient was continued on Delzicol 1200 mg every day. ? ?Notably, in February 2023 he got a notification stating that his insurance will no longer cover mesalamine, so he was started on sulfasalazine 1000 milligrams twice daily and folic acid supplementation. He has been taking it for the last  month and has tolerated it well based on his wife's testimony. She reports she was trying to stretch his Delzicol as much as possible and noticed he was flaring up his symptoms. ? ?He is currently having 1 Bm every day, but sometime4s he is having 1 BM in the middle of the night without fecal soiling. No urgency or diarrhea cosnsitency. The patient denies having any nausea, vomiting, fever, chills, hematochezia, melena, hematemesis, abdominal distention, abdominal pain, jaundice, pruritus or weight loss. ? ?Most recent blood work-up from 07/20/2021 showed although cell count of 18,400, hemoglobin of 15.0 and platelets of 288, BMP showed creatinine of 1.01 normal electrolytes. ? ?Patient needs refills for pantoprazole today. ? ?Last EGD: never ?Last Colonoscopy: 2018  ?Inflammation characterized by congestion (edema), erythema, friability, granularity and shallow ulcerations was found. The entire colon was affected. This was moderate in severity.  Biopsies were taken with a cold forceps for histology.  ?Pathology showed chronic colitis in rectum, left and right colon ? ?Past Medical History: ?Past Medical History:  ?Diagnosis Date  ? Acid reflux 04/27/2021  ? Acute metabolic encephalopathy 30/05/6225  ? Anxiety   ? Arthritis   ? Cerebral embolism with cerebral infarction 04/15/2018  ? Chest pain 05/19/2015  ? Depression   ? EEG abnormality without seizure 05/19/2021  ? Encephalitis 04/30/2021  ? Encephalopathy due to COVID-19 virus   ? Episodic confusion 05/19/2021  ? Heart murmur   ? Hyperglycemia 05/19/2015  ? Hypertension   ? Left thalamic lacunar infarct 05/24/2018  ? OSA (obstructive sleep apnea)   ? cpap 9 cm H2O  ? Recurrent unilateral inguinal hernia with obstruction and without gangrene   ? SBO (small bowel obstruction)   ? Sleep apnea 3 years ago  ? Ulcerative colitis 09/24/2019  ? Vascular dementia without behavioral disturbance 05/19/2021  ? ? ?Past Surgical History: ?Past Surgical History:  ?Procedure Laterality Date  ? BIOPSY  06/21/2017  ? Procedure: BIOPSY;  Surgeon: Rogene Houston, MD;  [facility];  Service: Endoscopy;;  rectal, righta and left colon;  ? COLONOSCOPY N/A 06/21/2017  ? Procedure: COLONOSCOPY;  Surgeon: Rogene Houston, MD;  [facility]: [facility];  Service: Endoscopy;  Laterality: N/A;  1:25  ? HERNIA REPAIR  3 years ago  ? INGUINAL HERNIA REPAIR Right 04/04/2018  ? Procedure: HERNIA REPAIR INGUINAL ADULT WITH MESH;  Surgeon: Aviva Signs, MD;  Location: [facility];  Service: General;  Laterality: Right;  ? INGUINAL HERNIA REPAIR Right 01/31/2019  ? Procedure: RECURRENT RIGHT INGUINAL HERNIORRHAPY WITH MESH, INCARCERATED;  Surgeon: Aviva Signs, MD;  Location:  [facility];  Service: General;  Laterality: Right;  ? JOINT REPLACEMENT  July 19, 2021  ? Total knee repl.  ? KNEE SURGERY    ? tore mcl in high school (70's)  ? TOTAL KNEE ARTHROPLASTY Left 07/19/2021  ? Procedure: TOTAL KNEE ARTHROPLASTY;  Surgeon:  Gaynelle Arabian, MD;  Location: [facility];  Service: Orthopedics;  Laterality: Left;  ? ? ?Family History: ?Family History  ?Problem Relation Age of Onset  ? Tuberculosis Mother   ? Stroke Mother   ? Arthritis Mother   ? Cancer Mother   ? Heart disease Father   ?     died at 14  ? Early death Father   ? Bradycardia Brother   ?     pacemaker  ? Stroke Brother   ? Hypertension Brother   ? Mental illness Paternal Grandfather   ?     suicide at 43  ? Suicidality Paternal Grandfather   ? Cancer Paternal Uncle   ? Dementia Maternal Uncle 17  ? Colon cancer Neg Hx   ? ? ?Social History: ?Social History  ? ?Tobacco Use  ?Smoking Status Never  ?Smokeless Tobacco Never  ? ?Social History  ? ?Substance and Sexual Activity  ?Alcohol Use No  ? ?Social History  ? ?Substance and Sexual Activity  ?Drug Use No  ? ? ?Allergies: ?No Known Allergies ? ?Medications: ?Current Outpatient Medications  ?Medication Sig Dispense Refill  ? acetaminophen (TYLENOL) 500 MG tablet Take 500 mg by mouth every 6 (six) hours as needed (pain).    ? aspirin EC 81 MG tablet Take 81 mg by mouth daily. Swallow whole.    ? atorvastatin (LIPITOR) 40 MG tablet TAKE ONE TABLET BY MOUTH AT 6PM 90 tablet 3  ? [facility] B-12 500 MCG tablet Take 500 mcg by mouth daily.    ? diphenhydrAMINE (BENADRYL) 25 MG tablet Take 25 mg by mouth daily as needed for allergies.    ? donepezil (ARICEPT) 5 MG tablet Take 1 tablet (5 mg total) by mouth at bedtime. 90 tablet 3  ? escitalopram (LEXAPRO) 20 MG tablet Take 1 tablet (20 mg total) by mouth at bedtime. 90 tablet 3  ? folic acid (FOLVITE) 1 MG tablet Take 1 tablet (1 mg total) by mouth daily. 90 tablet 0  ? losartan (COZAAR) 100 MG tablet TAKE ONE (1) TABLET BY MOUTH EVERY DAY 90 tablet 2  ? pantoprazole (PROTONIX) 40 MG tablet TAKE 1 TABLET (40 MG TOTAL) BY MOUTH DAILY. NEEDS OFFICE VISIT 60 tablet 0  ? sulfaSALAzine (AZULFIDINE) 500 MG tablet Take 2 tablets (1,000 mg total) by mouth 2 (two) times daily. 360 tablet 0  ? ?No current  facility-administered medications for this visit.  ? ? ?Review of Systems: ?GENERAL: negative for malaise, night sweats ?HEENT: No changes in hearing or vision, no nose bleeds or other nasal problems. ?NECK: Negative for lumps, goiter, pain and significant neck swelling ?RESPIRATORY: Negative for cough, wheezing ?CARDIOVASCULAR: Negative for chest pain, leg swelling, palpitations, orthopnea ?GI: SEE HPI ?MUSCULOSKELETAL: Negative for joint pain or swelling, back pain, and muscle pain. ?SKIN: Negative for lesions, rash ?PSYCH: Negative for sleep disturbance, mood disorder and recent psychosocial stressors. ?HEMATOLOGY Negative for prolonged bleeding, bruising easily, and swollen nodes. ?ENDOCRINE: Negative for cold or heat intolerance, polyuria, polydipsia and goiter. ?NEURO: negative for tremor, gait imbalance, syncope and seizures. ?The remainder of the review of systems is noncontributory. ? ? ?Physical Exam: ?BP 133/89 (BP Location: Left Arm, Patient Position: Sitting, Cuff  Size: Large)   Pulse 68   Temp 98 ?F (36.7 ?C) (Oral)   Ht 6' (1.829 m)   Wt 205 lb 11.2 oz (93.3 kg)   BMI 27.90 kg/m?  ?GENERAL: The patient is AO x3, in no acute distress. ?HEENT: Head is normocephalic and atraumatic. EOMI are intact. Mouth is well hydrated and without lesions. ?NECK: Supple. No masses ?LUNGS: Clear to auscultation. No presence of rhonchi/wheezing/rales. Adequate chest expansion ?HEART: RRR, normal s1 and s2. ?ABDOMEN: Soft, nontender, no guarding, no peritoneal signs, and nondistended. BS +. No masses. ?EXTREMITIES: Without any cyanosis, clubbing, rash, lesions or edema. ?NEUROLOGIC: AOx3, no focal motor deficit. ?SKIN: no jaundice, no rashes ? ?Imaging/Labs: ?as above ? ?I personally reviewed and interpreted the available labs, imaging and endoscopic files. ? ?Impression and Plan: ?PHAT DALTON is a 66 y.o. male with past medical history of previous stroke, GERD, ulcerative colitis, vascular dementia, hypertension,  hyperlipidemia, who presents for follow up of GERD and UC.  The patient has presented clinical remission for multiple years while on mesalamine compounds, although endoscopic remission and has never been evaluated.  U

## 2022-01-24 NOTE — Patient Instructions (Addendum)
Perform blood workup ?Continue pantoprazole 40 mg qday ?Continue sulfasalazine and folic acid ?Will discuss repeating colonoscopy in next appointment ?

## 2022-01-26 ENCOUNTER — Emergency Department (HOSPITAL_COMMUNITY): Payer: Medicare PPO

## 2022-01-26 ENCOUNTER — Ambulatory Visit
Admission: EM | Admit: 2022-01-26 | Discharge: 2022-01-26 | Disposition: A | Discharge status: Home / Self Care | Payer: Medicare PPO

## 2022-01-26 ENCOUNTER — Encounter (HOSPITAL_COMMUNITY): Payer: Self-pay

## 2022-01-26 ENCOUNTER — Other Ambulatory Visit: Payer: Self-pay

## 2022-01-26 ENCOUNTER — Emergency Department (HOSPITAL_COMMUNITY)
Admission: EM | Admit: 2022-01-26 | Discharge: 2022-01-26 | Disposition: A | Discharge status: Home / Self Care | Payer: Medicare PPO | Attending: Emergency Medicine | Admitting: Emergency Medicine

## 2022-01-26 DIAGNOSIS — Z7982 Long term (current) use of aspirin: Secondary | ICD-10-CM | POA: Diagnosis not present

## 2022-01-26 DIAGNOSIS — Z8673 Personal history of transient ischemic attack (TIA), and cerebral infarction without residual deficits: Secondary | ICD-10-CM | POA: Insufficient documentation

## 2022-01-26 DIAGNOSIS — Z79899 Other long term (current) drug therapy: Secondary | ICD-10-CM | POA: Insufficient documentation

## 2022-01-26 DIAGNOSIS — Q670 Congenital facial asymmetry: Secondary | ICD-10-CM | POA: Diagnosis not present

## 2022-01-26 DIAGNOSIS — R220 Localized swelling, mass and lump, head: Secondary | ICD-10-CM | POA: Diagnosis present

## 2022-01-26 LAB — CBC WITH DIFFERENTIAL/PLATELET
Abs Immature Granulocytes: 0.02 10*3/uL (ref 0.00–0.07)
Basophils Absolute: 0.1 10*3/uL (ref 0.0–0.1)
Basophils Relative: 2 %
Eosinophils Absolute: 0.3 10*3/uL (ref 0.0–0.5)
Eosinophils Relative: 4 %
HCT: 45 % (ref 39.0–52.0)
Hemoglobin: 14.9 g/dL (ref 13.0–17.0)
Immature Granulocytes: 0 %
Lymphocytes Relative: 16 %
Lymphs Abs: 1 10*3/uL (ref 0.7–4.0)
MCH: 32.1 pg (ref 26.0–34.0)
MCHC: 33.1 g/dL (ref 30.0–36.0)
MCV: 97 fL (ref 80.0–100.0)
Monocytes Absolute: 0.9 10*3/uL (ref 0.1–1.0)
Monocytes Relative: 14 %
Neutro Abs: 4.1 10*3/uL (ref 1.7–7.7)
Neutrophils Relative %: 64 %
Platelets: 278 10*3/uL (ref 150–400)
RBC: 4.64 MIL/uL (ref 4.22–5.81)
RDW: 13.2 % (ref 11.5–15.5)
WBC: 6.4 10*3/uL (ref 4.0–10.5)
nRBC: 0 % (ref 0.0–0.2)

## 2022-01-26 LAB — BASIC METABOLIC PANEL
Anion gap: 7 (ref 5–15)
BUN: 18 mg/dL (ref 8–23)
CO2: 25 mmol/L (ref 22–32)
Calcium: 9.3 mg/dL (ref 8.9–10.3)
Chloride: 103 mmol/L (ref 98–111)
Creatinine, Ser: 1.11 mg/dL (ref 0.61–1.24)
GFR, Estimated: >60 mL/min (ref >60)
Glucose, Bld: 103 mg/dL — ABNORMAL HIGH (ref 70–99)
Potassium: 4.1 mmol/L (ref 3.5–5.1)
Sodium: 135 mmol/L (ref 135–145)

## 2022-01-26 NOTE — ED Triage Notes (Signed)
Per wife, pt woke up this morning with left sided facial swelling. Pt denies tingling, numbness, slurred speech, dental pain, ear pain. Pt wife is worry about stroke as he had 1 in the pass.  ?

## 2022-01-26 NOTE — ED Triage Notes (Signed)
Patient sent from urgent care with complaint of facial swelling. Patient denies other stroke like symptoms. Patient A&Ox4 ambulatory to room ?

## 2022-01-26 NOTE — Discharge Instructions (Signed)
Call your primary care doctor or specialist as discussed in the next 2-3 days.   Return immediately back to the ER if:  Your symptoms worsen within the next 12-24 hours. You develop new symptoms such as new fevers, persistent vomiting, new pain, shortness of breath, or new weakness or numbness, or if you have any other concerns.  

## 2022-01-26 NOTE — ED Provider Notes (Signed)
?[facility] ? ? ?MRN: [id number] DOB: [birth year] ? ?Subjective:  ? ?Justin [name] is a 66 y.o. male presenting for acute onset this morning of a change to the left side of his face.  Patient presents with his wife who reports that his face looks different and around the mouth on the left side only.  Has a history of a stroke in 2019.  The patient's wife also states that the left arm seems a little bit weaker.  The patient denies any headache, confusion, vision changes, slurred speech, weakness, numbness or tingling, chest pain.  He does have a history of vascular dementia as well.  Otherwise, patient was last known to look and appear his normal self last night and awoke with the symptoms. ? ?No current facility-administered medications for this encounter. ? ?Current Outpatient Medications:  ?  acetaminophen (TYLENOL) 500 MG tablet, Take 500 mg by mouth every 6 (six) hours as needed (pain)., Disp: , Rfl:  ?  aspirin EC 81 MG tablet, Take 81 mg by mouth daily. Swallow whole., Disp: , Rfl:  ?  atorvastatin (LIPITOR) 40 MG tablet, TAKE ONE TABLET BY MOUTH AT 6PM, Disp: 90 tablet, Rfl: 3 ?  [facility] B-12 500 MCG tablet, Take 500 mcg by mouth daily., Disp: , Rfl:  ?  diphenhydrAMINE (BENADRYL) 25 MG tablet, Take 25 mg by mouth daily as needed for allergies., Disp: , Rfl:  ?  donepezil (ARICEPT) 5 MG tablet, Take 1 tablet (5 mg total) by mouth at bedtime., Disp: 90 tablet, Rfl: 3 ?  escitalopram (LEXAPRO) 20 MG tablet, Take 1 tablet (20 mg total) by mouth at bedtime., Disp: 90 tablet, Rfl: 3 ?  folic acid (FOLVITE) 1 MG tablet, Take 1 tablet (1 mg total) by mouth daily., Disp: 90 tablet, Rfl: 3 ?  losartan (COZAAR) 100 MG tablet, TAKE ONE (1) TABLET BY MOUTH EVERY DAY, Disp: 90 tablet, Rfl: 2 ?  pantoprazole (PROTONIX) 40 MG tablet, Take 1 tablet (40 mg total) by mouth daily. NEEDS OFFICE VISIT, Disp: 90 tablet, Rfl: 3 ?  sulfaSALAzine (AZULFIDINE) 500 MG tablet, Take 2 tablets (1,000 mg total) by mouth 2 (two)  times daily., Disp: 360 tablet, Rfl: 3  ? ?No Known Allergies ? ?Past Medical History:  ?Diagnosis Date  ? Acid reflux 04/27/2021  ? Acute metabolic encephalopathy 56/38/9373  ? Anxiety   ? Arthritis   ? Cerebral embolism with cerebral infarction 04/15/2018  ? Chest pain 05/19/2015  ? Depression   ? EEG abnormality without seizure 05/19/2021  ? Encephalitis 04/30/2021  ? Encephalopathy due to COVID-19 virus   ? Episodic confusion 05/19/2021  ? Heart murmur   ? Hyperglycemia 05/19/2015  ? Hypertension   ? Left thalamic lacunar infarct 05/24/2018  ? OSA (obstructive sleep apnea)   ? cpap 9 cm H2O  ? Recurrent unilateral inguinal hernia with obstruction and without gangrene   ? SBO (small bowel obstruction)   ? Sleep apnea 3 years ago  ? Ulcerative colitis 09/24/2019  ? Vascular dementia without behavioral disturbance 05/19/2021  ?  ? ?Past Surgical History:  ?Procedure Laterality Date  ? BIOPSY  06/21/2017  ? Procedure: BIOPSY;  Surgeon: Rogene Houston, MD;  [facility];  Service: Endoscopy;;  rectal, righta and left colon;  ? COLONOSCOPY N/A 06/21/2017  ? Procedure: COLONOSCOPY;  Surgeon: Rogene Houston, MD;  [facility];  Service: Endoscopy;  Laterality: N/A;  1:25  ? HERNIA REPAIR  3 years ago  ?  INGUINAL HERNIA REPAIR Right 04/04/2018  ? Procedure: HERNIA REPAIR INGUINAL ADULT WITH MESH;  Surgeon: Aviva Signs, MD;  Location: [facility];  Service: General;  Laterality: Right;  ? INGUINAL HERNIA REPAIR Right 01/31/2019  ? Procedure: RECURRENT RIGHT INGUINAL HERNIORRHAPY WITH MESH, INCARCERATED;  Surgeon: Aviva Signs, MD;  Location: [facility];  Service: General;  Laterality: Right;  ? JOINT REPLACEMENT  July 19, 2021  ? Total knee repl.  ? KNEE SURGERY    ? tore mcl in high school (70's)  ? TOTAL KNEE ARTHROPLASTY Left 07/19/2021  ? Procedure: TOTAL KNEE ARTHROPLASTY;  Surgeon: Gaynelle Arabian, MD;  Location: [facility];  Service: Orthopedics;  Laterality: Left;  ? ? ?Family History  ?Problem  Relation Age of Onset  ? Tuberculosis Mother   ? Stroke Mother   ? Arthritis Mother   ? Cancer Mother   ? Heart disease Father   ?     died at 39  ? Early death Father   ? Bradycardia Brother   ?     pacemaker  ? Stroke Brother   ? Hypertension Brother   ? Mental illness Paternal Grandfather   ?     suicide at 39  ? Suicidality Paternal Grandfather   ? Cancer Paternal Uncle   ? Dementia Maternal Uncle 50  ? Colon cancer Neg Hx   ? ? ?Social History  ? ?Tobacco Use  ? Smoking status: Never  ? Smokeless tobacco: Never  ?Vaping Use  ? Vaping Use: Never used  ?Substance Use Topics  ? Alcohol use: No  ? Drug use: No  ? ? ?ROS ? ? ?Objective:  ? ?Vitals: ?Pulse 80   Temp 97.8 ?F (36.6 ?C) (Oral)   Resp 18  ? ?BP 143/79 on right arm in seated position.  ? ?Physical Exam ?Constitutional:   ?   General: He is not in acute distress. ?   Appearance: Normal appearance. He is well-developed. He is not ill-appearing, toxic-appearing or diaphoretic.  ?HENT:  ?   Head: Normocephalic and atraumatic.  ? ?   Right Ear: External ear normal.  ?   Left Ear: External ear normal.  ?   Nose: Nose normal.  ?   Mouth/Throat:  ?   Mouth: Mucous membranes are moist.  ?Eyes:  ?   General: No scleral icterus.    ?   Right eye: No discharge.     ?   Left eye: No discharge.  ?   Extraocular Movements: Extraocular movements intact.  ?Cardiovascular:  ?   Rate and Rhythm: Normal rate and regular rhythm.  ?   Heart sounds: Normal heart sounds. No murmur heard. ?  No friction rub. No gallop.  ?Pulmonary:  ?   Effort: Pulmonary effort is normal. No respiratory distress.  ?   Breath sounds: Normal breath sounds. No stridor. No wheezing, rhonchi or rales.  ?Musculoskeletal:  ?   Comments: Strength slightly decreased for the upper part of his left arm but otherwise the left forearm, wrist, hand and grip strength is very comparable to the right.  Full range of motion throughout.  Strength 5/5 for the lower extremities and symmetric.  ?Neurological:  ?    Mental Status: He is alert and oriented to person, place, and time.  ?   Cranial Nerves: No cranial nerve deficit.  ?   Motor: No weakness.  ?   Coordination: Coordination normal.  ?   Gait: Gait normal.  ?   Deep Tendon Reflexes:  Reflexes normal.  ?   Comments: No facial droop.  Negative Romberg and pronator drift.  ?Psychiatric:     ?   Mood and Affect: Mood normal.     ?   Behavior: Behavior normal.     ?   Thought Content: Thought content normal.     ?   Judgment: Judgment normal.  ? ? ?Assessment and Plan :  ? ?PDMP not reviewed this encounter. ? ?1. Facial asymmetry   ?2. History of stroke   ? ?There is no overt sign of an active stroke now.  Last known normal status was yesterday night.  No overt sign of facial or oral/dental infection.  Given his medical history recommended further evaluation and care than we can provide in the urgent care setting.  Discussed this with his wife and she would like to take him to the hospital now by personal vehicle for further rule out of an acute encephalopathy. ?  ?Jaynee Eagles, PA-C ?01/26/22 1046 ? ?

## 2022-01-26 NOTE — ED Provider Notes (Signed)
?[facility] ?Provider Note ? ? ?CSN: [id number] ?Arrival date & time: 01/26/22  1054 ? ?  ? ?History ? ?Chief Complaint  ?Patient presents with  ? Facial Swelling  ? ? ?Justin [name] is a 66 y.o. male. ? ?Patient presents with new onset left facial swelling.  Says he woke up and noticed a swelling on the left side of the face.  Denies any pain.  Denies any new numbness or weakness.  He had a history of stroke several years ago but no subsequent sequela per patient and wife.  Otherwise no reports of fevers or cough or vomiting or diarrhea denies any pain or discomfort.  He was seen in urgent care sent to the ER for further work-up. ? ? ?  ? ?Home Medications ?Prior to Admission medications   ?Medication Sig Start Date End Date Taking? Authorizing Provider  ?acetaminophen (TYLENOL) 500 MG tablet Take 500 mg by mouth every 6 (six) hours as needed (pain).    [provider]  ?aspirin EC 81 MG tablet Take 81 mg by mouth daily. Swallow whole.    [provider]  ?atorvastatin (LIPITOR) 40 MG tablet TAKE ONE TABLET BY MOUTH AT 6PM 06/15/21   [name], MD  ?[facility] B-12 500 MCG tablet Take 500 mcg by mouth daily. 07/31/21   [provider]  ?diphenhydrAMINE (BENADRYL) 25 MG tablet Take 25 mg by mouth daily as needed for allergies.    [provider]  ?donepezil (ARICEPT) 5 MG tablet Take 1 tablet (5 mg total) by mouth at bedtime. 09/27/21   Frann Rider, NP  ?escitalopram (LEXAPRO) 20 MG tablet Take 1 tablet (20 mg total) by mouth at bedtime. 09/29/21   Frann Rider, NP  ?folic acid (FOLVITE) 1 MG tablet Take 1 tablet (1 mg total) by mouth daily. 01/24/22 04/24/22  Harvel Quale, MD  ?losartan (COZAAR) 100 MG tablet TAKE ONE (1) TABLET BY MOUTH EVERY DAY 09/02/21   [name], MD  ?pantoprazole (PROTONIX) 40 MG tablet Take 1 tablet (40 mg total) by mouth daily. NEEDS OFFICE VISIT 01/24/22   Montez Morita, Quillian Quince, MD  ?sulfaSALAzine (AZULFIDINE)  500 MG tablet Take 2 tablets (1,000 mg total) by mouth 2 (two) times daily. 01/24/22 04/24/22  Harvel Quale, MD  ?   ? ?Allergies    ?Patient has no known allergies.   ? ?Review of Systems   ?Review of Systems  ?Constitutional:  Negative for fever.  ?HENT:  Negative for ear pain and sore throat.   ?Eyes:  Negative for pain.  ?Respiratory:  Negative for cough.   ?Cardiovascular:  Negative for chest pain.  ?Gastrointestinal:  Negative for abdominal pain.  ?Genitourinary:  Negative for flank pain.  ?Musculoskeletal:  Negative for back pain.  ?Skin:  Negative for color change and rash.  ?Neurological:  Negative for syncope.  ?All other systems reviewed and are negative. ? ?Physical Exam ?Updated Vital Signs ?BP 123/88   Pulse 66   Temp 98.1 ?F (36.7 ?C) (Oral)   Ht 6' (1.829 m)   Wt 90.7 kg   SpO2 100%   BMI 27.12 kg/m?  ?Physical Exam ?Constitutional:   ?   Appearance: He is well-developed.  ?HENT:  ?   Head: Normocephalic.  ?   Comments: Questionable mild swelling nonspecific on left face.  No erythema no abnormal warmth no cellulitis no fluctuance no tenderness on exam. ?   Nose: Nose normal.  ?Eyes:  ?   Extraocular  Movements: Extraocular movements intact.  ?Cardiovascular:  ?   Rate and Rhythm: Normal rate.  ?Pulmonary:  ?   Effort: Pulmonary effort is normal.  ?Skin: ?   Coloration: Skin is not jaundiced.  ?Neurological:  ?   General: No focal deficit present.  ?   Mental Status: He is alert and oriented to person, place, and time. Mental status is at baseline.  ?   Cranial Nerves: No cranial nerve deficit.  ?   Motor: No weakness.  ?   Gait: Gait normal.  ? ? ?ED Results / Procedures / Treatments   ?Labs ?(all labs ordered are listed, but only abnormal results are displayed) ?Labs Reviewed  ?BASIC METABOLIC PANEL - Abnormal; Notable for the following components:  ?    Result Value  ? Glucose, Bld 103 (*)   ? All other components within normal limits  ?CBC WITH DIFFERENTIAL/PLATELET  ?URINALYSIS,  ROUTINE W REFLEX MICROSCOPIC  ? ? ?EKG ?None ? ?Radiology ?MR BRAIN WO CONTRAST ? ?Result Date: 01/26/2022 ?CLINICAL DATA:  Transient ischemic attack. EXAM: MRI HEAD WITHOUT CONTRAST TECHNIQUE: Multiplanar, multiecho pulse sequences of the brain and surrounding structures were obtained without intravenous contrast. COMPARISON:  06/07/2021.  05/01/2021.  04/30/2021.  05/29/2020. FINDINGS: Brain: Diffusion imaging does not show any acute or subacute infarction. No focal abnormality affects the brainstem or cerebellum. Chronic volume loss and gliosis in the mesial temporal lobe on the left as seen on the last 2 studies but which was not present in June 10, 2020. Findings could be due to old infarction or sequela seizure disorder. Certainly the abnormality could predispose to seizure disorder. Elsewhere, old small vessel infarctions of the thalami are noted and there are minimal small vessel ischemic changes of the hemispheric white matter. No mass lesion, hemorrhage, hydrocephalus or extra-axial collection. Vascular: Major vessels at the base of the brain show flow. Skull and upper cervical spine: Negative Sinuses/Orbits: Clear/normal Other: None IMPRESSION: No acute finding. Stable appearance of volume loss and gliosis in the mesial temporal lobe on the left over the last 2 studies. This was not present in 06/10/20 however. This could be sequela of an old infarction or seizure disorder. The abnormality could predispose to seizure. Chronic small-vessel ischemic changes of the thalami and hemispheric white matter. Electronically Signed   By: Nelson Chimes M.D.   On: 01/26/2022 12:20   ? ?Procedures ?Procedures  ? ? ?Medications Ordered in ED ?Medications - No data to display ? ?ED Course/ Medical Decision Making/ A&P ?  ?                        ?Medical Decision Making ?Amount and/or Complexity of Data Reviewed ?Labs: ordered. ?Radiology: ordered. ? ? ?Review of chart shows urgent care visit earlier today. ? ?History  obtained from the patient and his wife at bedside. ? ?Cardiac monitoring, sinus rhythm. ? ?Comorbidities complicating presentation: Include history of stroke in the past. ? ?Work-up today unremarkable white count 6 hemoglobin 15 chemistry unremarkable.  MRI of the brain pursued with no acute findings noted. ? ?Recommending outpatient follow-up with his primary care doctor within the week.  Recommend immediate return for worsening symptoms fevers pain or any additional concerns. ? ? ? ? ? ? ? ?Final Clinical Impression(s) / ED Diagnoses ?Final diagnoses:  ?Facial swelling  ? ? ?Rx / DC Orders ?ED Discharge Orders   ? ? None  ? ?  ? ? ?  ?Manilla, Vonna Kotyk  S, MD ?01/26/22 1257 ? ?

## 2022-01-28 LAB — CBC WITH DIFFERENTIAL/PLATELET
Absolute Monocytes: 850 cells/uL (ref 200–950)
Basophils Absolute: 109 cells/uL (ref 0–200)
Basophils Relative: 1.6 %
Eosinophils Absolute: 360 cells/uL (ref 15–500)
Eosinophils Relative: 5.3 %
HCT: 46.2 % (ref 38.5–50.0)
Hemoglobin: 15.5 g/dL (ref 13.2–17.1)
Lymphs Abs: 1238 cells/uL (ref 850–3900)
MCH: 31.8 pg (ref 27.0–33.0)
MCHC: 33.5 g/dL (ref 32.0–36.0)
MCV: 94.9 fL (ref 80.0–100.0)
MPV: 10 fL (ref 7.5–12.5)
Monocytes Relative: 12.5 %
Neutro Abs: 4243 cells/uL (ref 1500–7800)
Neutrophils Relative %: 62.4 %
Platelets: 303 10*3/uL (ref 140–400)
RBC: 4.87 10*6/uL (ref 4.20–5.80)
RDW: 12.8 % (ref 11.0–15.0)
Total Lymphocyte: 18.2 %
WBC: 6.8 10*3/uL (ref 3.8–10.8)

## 2022-01-28 LAB — COMPREHENSIVE METABOLIC PANEL
AG Ratio: 1.9 (calc) (ref 1.0–2.5)
ALT: 17 U/L (ref 9–46)
AST: 23 U/L (ref 10–35)
Albumin: 4.3 g/dL (ref 3.6–5.1)
Alkaline phosphatase (APISO): 96 U/L (ref 35–144)
BUN: 16 mg/dL (ref 7–25)
CO2: 24 mmol/L (ref 20–32)
Calcium: 9.4 mg/dL (ref 8.6–10.3)
Chloride: 103 mmol/L (ref 98–110)
Creat: 1.16 mg/dL (ref 0.70–1.35)
Globulin: 2.3 g/dL (calc) (ref 1.9–3.7)
Glucose, Bld: 86 mg/dL (ref 65–99)
Potassium: 4.6 mmol/L (ref 3.5–5.3)
Sodium: 137 mmol/L (ref 135–146)
Total Bilirubin: 0.8 mg/dL (ref 0.2–1.2)
Total Protein: 6.6 g/dL (ref 6.1–8.1)

## 2022-03-13 ENCOUNTER — Encounter: Payer: Self-pay | Admitting: Adult Health

## 2022-03-14 ENCOUNTER — Ambulatory Visit: Payer: Medicare PPO | Admitting: Adult Health

## 2022-03-14 ENCOUNTER — Encounter: Payer: Self-pay | Admitting: Adult Health

## 2022-03-14 VITALS — BP 141/76 | HR 69 | Ht 73.0 in | Wt 204.0 lb

## 2022-03-14 DIAGNOSIS — Z9989 Dependence on other enabling machines and devices: Secondary | ICD-10-CM

## 2022-03-14 DIAGNOSIS — F0150 Vascular dementia without behavioral disturbance: Secondary | ICD-10-CM

## 2022-03-14 DIAGNOSIS — G4733 Obstructive sleep apnea (adult) (pediatric): Secondary | ICD-10-CM

## 2022-03-14 DIAGNOSIS — I6381 Other cerebral infarction due to occlusion or stenosis of small artery: Secondary | ICD-10-CM

## 2022-03-14 NOTE — Patient Instructions (Signed)
If you end up getting any extra vehicles in your home, would recommend disabling the vehicles if not in use such as disconnecting the battery to help avoid him leaving the home when your not around ? ?Can place cameras around the house to help monitor daily activity and help with locating misplaced objects ? ?Can also set up specific locations which are labeled for certain things such as the mail to hopefully reduce the chance of misplacement  ? ?Continue aspirin 81 mg daily  and atorvastatin 62m daily  for secondary stroke prevention ? ?Continue nightly use of CPAP for sleep apnea management ? ?Continue to follow up with PCP regarding cholesterol and blood pressure management  ?Maintain strict control of hypertension with blood pressure goal below 130/90 and cholesterol with LDL cholesterol (bad cholesterol) goal below 70 mg/dL.  ? ?Signs of a Stroke? Follow the BEFAST method:  ?Balance Watch for a sudden loss of balance, trouble with coordination or vertigo ?Eyes Is there a sudden loss of vision in one or both eyes? Or double vision?  ?Face: Ask the person to smile. Does one side of the face droop or is it numb?  ?Arms: Ask the person to raise both arms. Does one arm drift downward? Is there weakness or numbness of a leg? ?Speech: Ask the person to repeat a simple phrase. Does the speech sound slurred/strange? Is the person confused ? ?Time: If you observe any of these signs, call 911. ? ? ? ? ? ?Followup in the future with me in 6 months or call earlier if needed ? ? ? ? ? ? ?Thank you for coming [facility]. I hope we have been able to provide you high quality care today. ? ?You may receive a patient satisfaction survey over the next few weeks. We would appreciate your feedback and comments so that we may continue to improve ourselves and the health of our patients. ? ?

## 2022-03-14 NOTE — Progress Notes (Signed)
? ? ? ? ?Provider:  Larey [name], [name]  ?Primary Care Physician:  Justin Frizzle, MD ? ?Reason for visit: Office follow-up ? ? ?Chief Complaint  ?Patient presents with  ? Follow-up  ?  RM 3 with spouse Justin [name]  ?Pt is well and stable, no new stroke or dementia concerns   ? ? ? ?HPI:  ? ?Update 03/14/2022 JM: Patient returns for stroke and dementia follow-up after prior visit 09/27/2021.  He is accompanied by his wife. ? ?Vascular dementia:  ?Hx of stroke: ? ?Per patient ?Does have issues with short term memory - believes some decline since prior visit ?Is able to maintain majority ADLs independently but wife does need to pick out clothes as it may take him too long ?Does have difficulty focusing or may lose train of thought or get distracted easily ?Reports he is still working and going to job sites. Can get frustrated due to waiting for materials to come in which can take 2-3 weeks and can hold up completion of job (per wife, not currently working) ?He tries to stay busy, works around his yard and likes to Press photographer prescribing ? ?Per wife ?can have good days/bad days. At times, very clear and no difficulty but other days will have great difficulty doing simple tasks or constantly repeating questions ?Will start certain tasks and will move on to a different task before completing ?No issues with being distracted or staying focused prior to stroke ?Will have difficulty finding the correct words or say the incorrect word (at times will realize he used incorrect word) ?Can have occasional agitation more so when feeling overwhelmed in crowded areas ?Typically does well if stays with same daily routine ?Mentions exaggerated laughing such as when watching a TV show that he has seen before. No inappropriate laughing but laughs excessively at things that may not be that funny to others. Denies uncontrollable laughter or any uncontrollable or inappropriate crying. ?Misplaces items around the house where wife  will have difficulty finding (such as the mail) ? 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 Thankfully, wife was able to find him before doing any work and he was able to be brought home safely. This has not reoccurred since then.  They now have locations set up on cell phone.  ? ?MMSE today 18/30 (prior 20/30) ? ?He has remained on Aricept 5 mg daily and Lexapro 20 mg daily.  Denies side effects. ? ?Did have repeat MRI 3/22 after presenting to ED with left facial swelling which was negative for acute findings. ? ?Compliant on aspirin and atorvastatin, denies side effects.  Blood pressure today 141/76.  ? ? ? ?OSA on CPAP: ? ?Past 30-day compliance report shows 30 out of 30 usage days with 21 days greater than 4 hours for 70% compliance.  Average usage 4 hours and 29 minutes.  Residual AHI 1.6 on set pressure of 9.  Leaks in the 95 percentile 9.9. does have some issues sleeping at times, can become uncomfortable in bed and will move to recliner without his CPAP. If wife wakes up when he is in the recliner, she will wake him up to go back to bed to use CPAP.  He has no complaints regarding CPAP. ? ? ? ? ?History provided for reference purposes only ?Update 09/27/2021 JM: Returns for 14-monthfollow-up accompanied by his wife who provides  majority of history ? ?Vascular dementia: ?Stable per wife - can have increased difficulty with overstimulation or with increased anxiety ?He has not worked since May 2022. He has also refrain from driving - wife questions his ability to return to driving as he is requesting to do so ?MMSE today 20/30 (prior 16/30 05/2021) -previously completed during f/u visit with Dr. Brett [name] for hospital follow-up after acute mental status change on 04/30/2021 possibly in setting of encephalopathy due to autoimmune encephalitis vs less likely seizures (see OV note from 05/19/2021). Repeat MRI  showed unchanged abnormality in anterior left temporal lobe possibly remote infarct vs encephalitis vs low-grade glioma (per report).  Repeat EEG 06/09/2021 no evidence of seizure activity and slowly discontinue Keppra. ?Dr. Brett [name] initiated Aricept 5 mg daily which he has been tolerating without side effects ?Eval by Dr. Melvyn [name] 06/2021 with likely vascular dementia likely exacerbated by history of COVID-19 and acute metabolic encephalopathy due to suspected encephalitis. Advised he refrain from driving and working. Recommended repeat eval in 12 months  ?No behavioral concerns ? ? ?OSA on CPAP: ?30-day compliance report shows 30 out of 30 usage days with only 7 days greater than 4 hours with average usage 3 hours and 0 minutes.  Residual AHI 1.3.  Leaks in the 95th percentile 13.9.  Set pressure 9 and EPR level 3. ?Continues to tolerate well but will wake up in the middle of the night to either go to the bathroom or to sit in recliner and upon returning back to bed, he will forget to put back on. Up to date on supplies provided through [facility].  Previously discussed inspire device - not a good candidate due to needing anesthesia (per Dr. Brett [name]).  ? ? ?History of stroke: ?Stable in regards to stroke aspect.  Denies new stroke/TIA symptoms.  Compliant on aspirin and atorvastatin -denies side effects.  Blood pressure today 136/74.   ? ? ?Other ? ?S/p left total knee arthroplasty 07/19/2021 - working with PT - has 2 more sessions. Wife mentioned physical therapist recommended cognitive therapy initially but unsure if they still recommend this.  She plans on further discussing at next visit. ? ? ? ?No further concerns at this time ? ? ?Update 03/29/2021 JM:  ? ?Justin [name] returns for 76-monthstroke and sleep apnea follow-up accompanied by his wife. ? ?Wife sent MyChart message prior to todays visit with concerns regarding continued decline in his cognition especially with short-term memory and processing of information.   Remains on Lexapro for prior increased agitation and irritation.  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  This concerns his wife as she does not feel he should be still working in setting of his continued cognitive decline.  MMSE today 24/30 (prior 22/30).  Of note, he was diagnosed with COVID-19 12/03/2020 and prior COVID 19 06/2020. He did recover well from most recent case and does not believe any worsening of cognition denies residual deficits. ? ?Compliant on aspirin and atorvastatin without associated side effects.  Blood pressure today 111/70.  ? ?CPAP compliance report from the past 30 days shows 30 out of 30 usage days but only 15 days greater than 4 hours with average usage 4 hours and 2 minutes.  Residual AHI 0.9.  ?Per wife, will sleep for a couple hours prior to going to bed in recliner then will sleep in bed for 2-4 hours  then go back to his recliner and sleep additional time.  He questions possible use of inspire device ? ?Plans on undergoing knee replacement - sees ortho Thursday to discuss further  ? ?No further concerns at this time ? ? ?Update 09/28/2020 JM: Justin [name] returns for 72-monthfollow-up regarding history of stroke and cognitive decline.  Since prior visit, he was diagnosed with COVID 8/30 and wife has noticed slight worsening of cognition since that time but overall stable. MMSE today 22/30 (prior 22/30). MRI, EEG and lab work all unremarkable for reversible causes or abnormality. 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 Wife has been assisting with this. Does report occasional increased agitation or irritation but has been able to better manage this. Currently on lexapro 265mdaily. Denies new or recurrent  stroke/TIA symptoms remaining on aspirin 325 mg daily and atorvastatin 40 mg daily without side effects.  Blood pressure today 110/68. History of OSA on CPAP with difficulty recently due to possibly walking pn

## 2022-03-24 ENCOUNTER — Ambulatory Visit: Payer: Medicare Other | Admitting: Adult Health

## 2022-05-04 ENCOUNTER — Encounter (INDEPENDENT_AMBULATORY_CARE_PROVIDER_SITE_OTHER): Payer: Self-pay | Admitting: Gastroenterology

## 2022-06-07 ENCOUNTER — Other Ambulatory Visit: Payer: Self-pay | Admitting: Family Medicine

## 2022-06-08 NOTE — Telephone Encounter (Signed)
Pls call pt for CPE/LABs for future refills

## 2022-06-13 ENCOUNTER — Other Ambulatory Visit: Payer: Self-pay | Admitting: Family Medicine

## 2022-06-16 ENCOUNTER — Telehealth: Payer: Self-pay

## 2022-06-16 MED ORDER — LOSARTAN POTASSIUM 100 MG PO TABS: ORAL_TABLET | ORAL | 2 refills | Status: DC

## 2022-06-16 NOTE — Addendum Note (Signed)
Addended by: Colman Cater on: 06/16/2022 12:42 PM   Modules accepted: Orders

## 2022-06-16 NOTE — Telephone Encounter (Signed)
Pt's spouse called in stating that pt has not received his refill for losartan (COZAAR) 100 MG tablet. Pt has been scheduled for an ov on 8/17 to discuss refills. Pt is asking for a courtesy refill of this med to last until his appt next week. Pt is completely out of this med.  LOV: 07/15/21

## 2022-06-23 ENCOUNTER — Encounter: Payer: Self-pay | Admitting: Adult Health

## 2022-06-23 ENCOUNTER — Telehealth: Payer: Self-pay | Admitting: Psychology

## 2022-06-23 ENCOUNTER — Ambulatory Visit: Payer: Medicare PPO | Admitting: Family Medicine

## 2022-06-23 VITALS — BP 120/76 | HR 69 | Ht 73.0 in | Wt 201.8 lb

## 2022-06-23 DIAGNOSIS — R413 Other amnesia: Secondary | ICD-10-CM | POA: Diagnosis not present

## 2022-06-23 DIAGNOSIS — R5383 Other fatigue: Secondary | ICD-10-CM | POA: Diagnosis not present

## 2022-06-23 DIAGNOSIS — I10 Essential (primary) hypertension: Secondary | ICD-10-CM | POA: Diagnosis not present

## 2022-06-23 DIAGNOSIS — I6381 Other cerebral infarction due to occlusion or stenosis of small artery: Secondary | ICD-10-CM

## 2022-06-23 NOTE — Telephone Encounter (Signed)
Patient's wife called and said patient needs repeat neuropsychological testing. Please confirm whether you will need a new referral or not.

## 2022-06-23 NOTE — Progress Notes (Signed)
Subjective:    Patient ID: Justin [name], male    DOB: [birth year], 66 y.o.   MRN: [id number]  HPI Admit date: 04/30/2021 Discharge date: 05/05/2021   Admitted From: Home Disposition: Home   Recommendations for Outpatient Follow-up:  -Continue Keppra on discharge. -Have patient f/up with [facility] Neurology 4 weeks after discharge. -Recommend formal neurological cognitive testing (this has already been mentioned in out patient neurology notes). -F/U autoimmune encephalitis panel as outpatient. -[place] seizure precautions. Home safety seizure precautions.      Home Health:NO Equipment/Devices:No   Discharge Condition:Stable CODE STATUS:FULL Diet recommendation: Heart Healthy   Brief/Interim Summary:   Acute metabolic encephalopathy -Work-up diagnosis on discharge is autoimmune encephalitis versus less likely seizures (Patient and wife at bedside were counseled at length about seizures restrictions on discharge) -Patient presented with confusion more so than he has been.  Per wife, he is supposed to follow-up with neurology for mental status evaluation in August. -MRI brain showed significant abnormal signal in the left temporal lobe, differential includes subacute infarction, encephalitis versus seizure -MRI brain with contrast showed no evidence normal or pathological enhancement seen within the brain -Currently patient is alert and oriented x3 -Patient with no neurological focal deficits at this time -IR consulted for LP (LP attempted in the ED was unsuccessful)-not consistent with bacterial infection, therefore ampicillin, ceftriaxone and vancomycin have been discontinued -HSV was negative, VDRL nonreactive, so acyclovir was stopped -EEG showed mild diffuse slowing indicating global cerebral dysfunction.  Focal slowing indicating superimposed focal dysfunction in the lef frontotemporal region. -Pending autoimmune workup -Neurology consulted and appreciated, patient is treated with  IV Solu-Medrol 1000 mg oral daily, to finish total of 30 days, tomorrow he will receive it in the infusion center, gratefully it has been arranged by neurology -B12 on the lower side, he will be discharged on supplements   Essential hypertension -Antihypertensives currently held (metoprolol, HCTZ and losartan) during hospital stay given soft blood pressure, it is started to increase at time of discharge, so losartan has been resumed, patient heart rate on the lower side, so metoprolol need to be held. -Norvasc was recently discontinued by his PCP   Obstructive sleep apnea -Continue CPAP   History of CVA -Continue with aspirin   History of ulcerative colitis -Continue mesalamine   Hypokalemia -replaced   Discharge Diagnoses:  Active Problems:   OSA (obstructive sleep apnea)   Cerebral embolism with cerebral infarction   Acute metabolic encephalopathy   Encephalitis   Acid reflux    I reviewed the patient's discharge summary copied above.  Patient was admitted to the hospital June 24.  His wife came home and found the patient confused.  He was speaking incoherently and making no sense.  This was a sudden change for the patient.  He was taken immediately to the hospital due to concern about a possible stroke.  MRI showed abnormal signal in the left temporal lobe particularly in the hyper campus.  Differential diagnosis included subacute infarction, post seizure, encephalitis.  Patient underwent a lumbar puncture that ruled out herpes encephalitis.  CSF culture was negative.  He also underwent an extensive autoimmune panel to rule out autoimmune encephalitis and received immunosuppressive therapy.  Autoimmune panel was negative.  EEG showed mild diffuse slowing indicating global cerebral dysfunction.  Patient is here today with his wife for follow-up.  He seems back to his baseline.  He defers to his wife for the majority of the conversation and questioning.  Patient has seemingly  progressive  short-term memory loss and confusion.  I believe this is indicative of underlying dementia.  However his speech is back to his baseline.  Wife denies any further seizures.  He denies any fevers or chills or neck stiffness.  He denies any other acute neurologic deficit.  At that time, my plan was:  Blood pressure at home has been averaging in the 120-130/60-80 range.  Therefore I do not feel that the patient requires any antihypertensives at the present time as he has not been taking hydrochlorothiazide or amlodipine.  Of asked his wife to continue to monitor his blood pressure and notify me if consistently greater than 140/90.  Patient has underlying dementia.  I believe that the majority of his issue.  The acute change on June 24 reflected either encephalitis, seizure activity, or a TIA.  I favor a TIA.  Patient has no history of seizures.  Wife witnessed no seizure activity.  I will continue Keppra at the present time as the patient has an appointment to see his neurologist tomorrow and I will defer to their judgment about continuing [place] however I feel that the patient can likely discontinue this.  Autoimmune panel is negative suggesting against autoimmune encephalitis.  Patient has completed high-dose steroids and is back to his baseline.  Infectious disease work-up has been negative to date.  Therefore I believe most of the symptoms were due to a vascular insult on top of previous dementia.  If neurology agrees I would recommend starting Aricept but I will defer to their judgment tomorrow.  06/23/22 Wt Readings from Last 3 Encounters:  06/23/22 201 lb 12.8 oz (91.5 kg)  03/14/22 204 lb (92.5 kg)  01/26/22 200 lb (90.7 kg)   With regards to the patient's memory, his wife states that he has good days and bad days.  He tends to have more trouble in crowds.  However she denies any problems controlling his temper or anger.  He is not having mood swings or hallucinations or delirium.  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  She has noticed that he is more tired.  He seems not to have the stamina that he once had.  He also tends to go to sleep earlier and be more "sleepy".  This is despite wearing his CPAP machine for sleep apnea.  He denies any chest pain or shortness of breath or dyspnea on exertion.  He denies any abdominal pain or diarrhea or melena or hematochezia.  His exam today is normal.  He is still taking maximum dose of Lexapro.  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  He is now since retired and under less stress. Past Medical History:  Diagnosis Date   Acid reflux 81/08/3158   Acute metabolic encephalopathy 45/85/9292   Anxiety    Arthritis    Cerebral embolism with cerebral infarction 04/15/2018   Chest pain 05/19/2015   Depression    EEG abnormality without seizure 05/19/2021   Encephalitis 04/30/2021   Encephalopathy due to COVID-19 virus    Episodic confusion 05/19/2021   Heart murmur    Hyperglycemia 05/19/2015   Hypertension    Left thalamic lacunar infarct 05/24/2018   OSA (obstructive sleep apnea)    cpap 9 cm H2O   Recurrent unilateral inguinal hernia with obstruction and without gangrene    SBO (small bowel obstruction)    Sleep apnea 3 years ago   Ulcerative colitis 09/24/2019  Vascular dementia without behavioral disturbance 05/19/2021   Past Surgical History:  Procedure Laterality Date   BIOPSY  06/21/2017   Procedure: BIOPSY;  Surgeon: Rogene Houston, MD;  [facility];  Service: Endoscopy;;  rectal, righta and left colon;   COLONOSCOPY N/A 06/21/2017   Procedure: COLONOSCOPY;  Surgeon: Rogene Houston, MD;  [facility]: [facility];  Service: Endoscopy;  Laterality: N/A;  1:25   HERNIA REPAIR  3 years ago   INGUINAL HERNIA REPAIR Right 04/04/2018   Procedure: HERNIA REPAIR INGUINAL ADULT WITH MESH;  Surgeon: Aviva Signs, MD;  Location: [facility]  ORS;  Service: General;  Laterality: Right;   INGUINAL HERNIA REPAIR Right 01/31/2019   Procedure: RECURRENT RIGHT INGUINAL HERNIORRHAPY WITH MESH, INCARCERATED;  Surgeon: Aviva Signs, MD;  Location: [facility] ORS;  Service: General;  Laterality: Right;   JOINT REPLACEMENT  July 19, 2021   Total knee repl.   KNEE SURGERY     tore mcl in high school (70's)   TOTAL KNEE ARTHROPLASTY Left 07/19/2021   Procedure: TOTAL KNEE ARTHROPLASTY;  Surgeon: Gaynelle Arabian, MD;  Location: [facility]S;  Service: Orthopedics;  Laterality: Left;   Current Outpatient Medications on File Prior to Visit  Medication Sig Dispense Refill   acetaminophen (TYLENOL) 500 MG tablet Take 500 mg by mouth every 6 (six) hours as needed (pain).     Ascorbic Acid (VITAMIN C) 100 MG tablet Take 100 mg by mouth daily.     aspirin EC 81 MG tablet Take 81 mg by mouth daily. Swallow whole.     atorvastatin (LIPITOR) 40 MG tablet TAKE ONE TABLET BY MOUTH AT 6PM 90 tablet 1   [facility] B-12 500 MCG tablet Take 500 mcg by mouth daily.     diphenhydrAMINE (BENADRYL) 25 MG tablet Take 25 mg by mouth daily as needed for allergies.     donepezil (ARICEPT) 5 MG tablet Take 1 tablet (5 mg total) by mouth at bedtime. 90 tablet 3   escitalopram (LEXAPRO) 20 MG tablet Take 1 tablet (20 mg total) by mouth at bedtime. 90 tablet 3   losartan (COZAAR) 100 MG tablet TAKE ONE (1) TABLET BY MOUTH EVERY DAY 90 tablet 2   pantoprazole (PROTONIX) 40 MG tablet Take 1 tablet (40 mg total) by mouth daily. NEEDS OFFICE VISIT 90 tablet 3   sulfaSALAzine (AZULFIDINE) 500 MG tablet Take 2 tablets (1,000 mg total) by mouth 2 (two) times daily. 360 tablet 3   No current facility-administered medications on file prior to visit.   No Known Allergies Social History   Socioeconomic History   Marital status: Married    Spouse name: [name]   Number of children: Not on file   Years of education: 12   Highest education level: High school graduate  Occupational History    Occupation: Unemployed/retired    Comment: Clinical biochemist  Tobacco Use   Smoking status: Never   Smokeless tobacco: Never  Vaping Use   Vaping Use: Never used  Substance and Sexual Activity   Alcohol use: No   Drug use: No   Sexual activity: Yes    Birth control/protection: None  Other Topics Concern   Not on file  Social History Narrative   Lives with wife   Social Determinants of Health   Financial Resource Strain: Low Risk  (08/20/2021)   Overall Financial Resource Strain (CARDIA)    Difficulty of Paying Living Expenses: Not hard at all  Food Insecurity: No Food Insecurity (08/20/2021)   Hunger  Vital Sign    Worried About Charity fundraiser in the Last Year: Never true    Ran Out of Food in the Last Year: Never true  Transportation Needs: No Transportation Needs (08/20/2021)   PRAPARE - Hydrologist (Medical): No    Lack of Transportation (Non-Medical): No  Physical Activity: Sufficiently Active (08/20/2021)   Exercise Vital Sign    Days of Exercise per Week: 3 days    Minutes of Exercise per Session: 60 min  Stress: No Stress Concern Present (08/20/2021)   [place]    Feeling of Stress : Not at all  Social Connections: [place] (08/20/2021)   Social Connection and Isolation Panel [NHANES]    Frequency of Communication with Friends and Family: More than three times a week    Frequency of Social Gatherings with Friends and Family: More than three times a week    Attends Religious Services: More than 4 times per year    Active Member of [employer] or Organizations: Yes    Attends Archivist Meetings: More than 4 times per year    Marital Status: Married  Human resources officer Violence: Not At Risk (08/20/2021)   Humiliation, Afraid, Rape, and Kick questionnaire    Fear of Current or Ex-Partner: No    Emotionally Abused: No    Physically Abused: No    Sexually  Abused: No    Review of Systems     Objective:   Physical Exam Vitals reviewed.  Constitutional:      Appearance: Normal appearance. He is normal weight.  Cardiovascular:     Rate and Rhythm: Normal rate and regular rhythm.     Heart sounds: Normal heart sounds. No murmur heard.    No friction rub. No gallop.  Pulmonary:     Effort: Pulmonary effort is normal. No respiratory distress.     Breath sounds: Normal breath sounds. No stridor.  Abdominal:     General: Bowel sounds are normal. There is no distension.     Palpations: Abdomen is soft.     Tenderness: There is no abdominal tenderness. There is no guarding.  Musculoskeletal:     Right lower leg: No edema.     Left lower leg: No edema.  Skin:    Findings: No erythema or rash.  Neurological:     General: No focal deficit present.     Mental Status: He is alert and oriented to person, place, and time.     Cranial Nerves: No cranial nerve deficit.     Motor: No weakness.     Gait: Gait normal.         Assessment & Plan:  Fatigue, unspecified type - Plan: CBC with Differential/Platelet, COMPLETE METABOLIC PANEL WITH GFR, TSH, Testosterone Total,Free,Bio, Males  Benign essential HTN  Cerebrovascular accident (CVA) due to occlusion of small artery ([place])  Memory loss  I do not see a reason for the fatigue based on his exam or his review of systems.  I will check a CBC, CMP, testosterone level, and a TSH.  However I suspect it may be due to medication so of asked his wife to decrease the Lexapro to 10 mg a day and in 1 to 2 months if he is doing well we may try to discontinue the medication altogether as this may not be necessary for him anymore.  We will leave Aricept at his current dose.  I will  not increase it because it has not been a perceptible change.  His blood pressure today is excellent.

## 2022-06-24 LAB — COMPLETE METABOLIC PANEL WITH GFR
AG Ratio: 1.8 (calc) (ref 1.0–2.5)
ALT: 16 U/L (ref 9–46)
AST: 24 U/L (ref 10–35)
Albumin: 4.2 g/dL (ref 3.6–5.1)
Alkaline phosphatase (APISO): 81 U/L (ref 35–144)
BUN: 19 mg/dL (ref 7–25)
CO2: 24 mmol/L (ref 20–32)
Calcium: 9.2 mg/dL (ref 8.6–10.3)
Chloride: 106 mmol/L (ref 98–110)
Creat: 1.24 mg/dL (ref 0.70–1.35)
Globulin: 2.3 g/dL (calc) (ref 1.9–3.7)
Glucose, Bld: 105 mg/dL — ABNORMAL HIGH (ref 65–99)
Potassium: 4.2 mmol/L (ref 3.5–5.3)
Sodium: 140 mmol/L (ref 135–146)
Total Bilirubin: 0.7 mg/dL (ref 0.2–1.2)
Total Protein: 6.5 g/dL (ref 6.1–8.1)
eGFR: 64 mL/min/{1.73_m2} (ref >=60)

## 2022-06-24 LAB — CBC WITH DIFFERENTIAL/PLATELET
Absolute Monocytes: 930 cells/uL (ref 200–950)
Basophils Absolute: 39 cells/uL (ref 0–200)
Basophils Relative: 0.7 %
Eosinophils Absolute: 193 cells/uL (ref 15–500)
Eosinophils Relative: 3.5 %
HCT: 42.5 % (ref 38.5–50.0)
Hemoglobin: 14.5 g/dL (ref 13.2–17.1)
Lymphs Abs: 1513 cells/uL (ref 850–3900)
MCH: 34.7 pg — ABNORMAL HIGH (ref 27.0–33.0)
MCHC: 34.1 g/dL (ref 32.0–36.0)
MCV: 101.7 fL — ABNORMAL HIGH (ref 80.0–100.0)
MPV: 9.9 fL (ref 7.5–12.5)
Monocytes Relative: 16.9 %
Neutro Abs: 2827 cells/uL (ref 1500–7800)
Neutrophils Relative %: 51.4 %
Platelets: 270 10*3/uL (ref 140–400)
RBC: 4.18 10*6/uL — ABNORMAL LOW (ref 4.20–5.80)
RDW: 11.6 % (ref 11.0–15.0)
Total Lymphocyte: 27.5 %
WBC: 5.5 10*3/uL (ref 3.8–10.8)

## 2022-06-24 LAB — TESTOSTERONE TOTAL,FREE,BIO, MALES
Albumin: 4.2 g/dL (ref 3.6–5.1)
Sex Hormone Binding: 32 nmol/L (ref 22–77)
Testosterone, Bioavailable: 151.5 ng/dL (ref 110.0–575.0)
Testosterone, Free: 78.7 pg/mL (ref 46.0–224.0)
Testosterone: 547 ng/dL (ref 250–827)

## 2022-06-24 LAB — TSH: TSH: 2.1 mIU/L (ref 0.40–4.50)

## 2022-06-24 NOTE — Telephone Encounter (Signed)
Left message to call back and speak with Starla.

## 2022-06-24 NOTE — Telephone Encounter (Signed)
Spoke with Justin [name]. She will give this some thought and call back for scheduling as needed, okay per Dr. Melvyn Novas.

## 2022-07-18 ENCOUNTER — Encounter (INDEPENDENT_AMBULATORY_CARE_PROVIDER_SITE_OTHER): Payer: Self-pay

## 2022-07-18 ENCOUNTER — Encounter (INDEPENDENT_AMBULATORY_CARE_PROVIDER_SITE_OTHER): Payer: Self-pay | Admitting: Gastroenterology

## 2022-07-18 ENCOUNTER — Telehealth (INDEPENDENT_AMBULATORY_CARE_PROVIDER_SITE_OTHER): Payer: Self-pay

## 2022-07-18 ENCOUNTER — Other Ambulatory Visit (INDEPENDENT_AMBULATORY_CARE_PROVIDER_SITE_OTHER): Payer: Self-pay

## 2022-07-18 ENCOUNTER — Ambulatory Visit (INDEPENDENT_AMBULATORY_CARE_PROVIDER_SITE_OTHER): Payer: Medicare PPO | Admitting: Gastroenterology

## 2022-07-18 VITALS — BP 115/81 | HR 70 | Temp 98.6°F | Ht 73.0 in | Wt 197.4 lb

## 2022-07-18 DIAGNOSIS — K5100 Ulcerative (chronic) pancolitis without complications: Secondary | ICD-10-CM

## 2022-07-18 DIAGNOSIS — K219 Gastro-esophageal reflux disease without esophagitis: Secondary | ICD-10-CM | POA: Diagnosis not present

## 2022-07-18 MED ORDER — PEG 3350-KCL-NA BICARB-NACL 420 G PO SOLR: 4000.0000 mL | ORAL | 0 refills | Status: DC

## 2022-07-18 NOTE — Patient Instructions (Addendum)
It was nice to meet you! We will get you scheduled for colonoscopy Please continue your sulfasalazine 1020m twice a day You can try doing pantoprazole every other day, If no symptoms of heartburn or acid reflux, you can try stopping the medication.  Follow up 6 months

## 2022-07-18 NOTE — Progress Notes (Signed)
Referring Provider: [name], MD Primary Care Physician:  [name], MD Primary GI Physician: Jenetta Downer   Chief Complaint  Patient presents with   Ulcerative pancolitis    6 month follow up on ulcerative pancolitis. Reports no concerns or problems today.    HPI:   Justin [name] is a 66 y.o. male with past medical history of previous stroke, GERD, ulcerative colitis, vascular dementia, hypertension, hyperlipidemia  Patient presenting today for follow up of Ulcerative Pancolitis.   Last seen 01/24/22, previously started on Sulfasalazine 1g BID in feb 2023 due to insurance no longer covering mesalamine. Having 1 BM per day, sometimes a BM during the night without fecal soiling, no diarrhea.   Recommended to check CBC, CMP, continue daily PPI, sulfasalazine 1g BID and folic acid, discussion about repeating colonoscopy at next appt.   Most recent labs 06/23/22 WBC 5.5, hgb 14.5, renal function and LFTs WNL.  Present:  UC: seems to be well controlled, having 1-2 BMs per day with no diarrhea, denies rectal bleeding or melena. No mucus in stools. Appetite is good, no overt weight loss.   GERD: he denies any episodes of heartburn or acid regurgitation. He tries to avoid spicy foods. Denies nausea, vomiting, dysphagia or odynophagia.   Last EGD: never Last Colonoscopy: 06/2017  Inflammation characterized by congestion (edema), erythema, friability, granularity and shallow ulcerations was found. The entire colon was affected. This was moderate in severity. Biopsies were taken with a cold forceps for histology.  Pathology showed chronic colitis in rectum, left and right colon   Past Medical History:  Diagnosis Date   Acid reflux 62/83/1517   Acute metabolic encephalopathy 61/60/7371   Anxiety    Arthritis    Cerebral embolism with cerebral infarction 04/15/2018   Chest pain 05/19/2015   Depression    EEG abnormality without seizure 05/19/2021   Encephalitis 04/30/2021    Encephalopathy due to COVID-19 virus    Episodic confusion 05/19/2021   Heart murmur    Hyperglycemia 05/19/2015   Hypertension    Left thalamic lacunar infarct 05/24/2018   OSA (obstructive sleep apnea)    cpap 9 cm H2O   Recurrent unilateral inguinal hernia with obstruction and without gangrene    SBO (small bowel obstruction)    Sleep apnea 3 years ago   Ulcerative colitis 09/24/2019   Vascular dementia without behavioral disturbance 05/19/2021    Past Surgical History:  Procedure Laterality Date   BIOPSY  06/21/2017   Procedure: BIOPSY;  Surgeon: Rogene Houston, MD;  [facility];  Service: Endoscopy;;  rectal, righta and left colon;   COLONOSCOPY N/A 06/21/2017   Procedure: COLONOSCOPY;  Surgeon: Rogene Houston, MD;  [facility];  Service: Endoscopy;  Laterality: N/A;  1:25   HERNIA REPAIR  3 years ago   INGUINAL HERNIA REPAIR Right 04/04/2018   Procedure: HERNIA REPAIR INGUINAL ADULT WITH MESH;  Surgeon: Aviva Signs, MD;  Location: [facility];  Service: General;  Laterality: Right;   INGUINAL HERNIA REPAIR Right 01/31/2019   Procedure: RECURRENT RIGHT INGUINAL HERNIORRHAPY WITH MESH, INCARCERATED;  Surgeon: Aviva Signs, MD;  Location: [facility];  Service: General;  Laterality: Right;   JOINT REPLACEMENT  July 19, 2021   Total knee repl.   KNEE SURGERY     tore mcl in high school (70's)   TOTAL KNEE ARTHROPLASTY Left 07/19/2021   Procedure: TOTAL KNEE ARTHROPLASTY;  Surgeon: Gaynelle Arabian, MD;  Location: [facility];  Service: Orthopedics;  Laterality: Left;    Current Outpatient Medications  Medication Sig Dispense Refill   acetaminophen (TYLENOL) 500 MG tablet Take 500 mg by mouth every 6 (six) hours as needed (pain).     Ascorbic Acid (VITAMIN C) 500 MG CAPS Take 500 mg by mouth daily.     aspirin EC 81 MG tablet Take 81 mg by mouth daily. Swallow whole.     atorvastatin (LIPITOR) 40 MG tablet TAKE ONE TABLET BY MOUTH AT 6PM 90 tablet 1   [facility]S  B-12 500 MCG tablet Take 500 mcg by mouth daily.     diphenhydrAMINE (BENADRYL) 25 MG tablet Take 25 mg by mouth daily as needed for allergies.     donepezil (ARICEPT) 5 MG tablet Take 1 tablet (5 mg total) by mouth at bedtime. 90 tablet 3   escitalopram (LEXAPRO) 20 MG tablet Take 1 tablet (20 mg total) by mouth at bedtime. 90 tablet 3   losartan (COZAAR) 100 MG tablet TAKE ONE (1) TABLET BY MOUTH EVERY DAY 90 tablet 2   pantoprazole (PROTONIX) 40 MG tablet Take 1 tablet (40 mg total) by mouth daily. NEEDS OFFICE VISIT 90 tablet 3   sulfaSALAzine (AZULFIDINE) 500 MG tablet Take 2 tablets (1,000 mg total) by mouth 2 (two) times daily. 360 tablet 3   No current facility-administered medications for this visit.    Allergies as of 07/18/2022   (No Known Allergies)    Family History  Problem Relation Age of Onset   Tuberculosis Mother    Stroke Mother    Arthritis Mother    Cancer Mother    Heart disease Father        died at 71   Early death Father    Bradycardia Brother        pacemaker   Stroke Brother    Hypertension Brother    Mental illness Paternal Grandfather        suicide at 81   Suicidality Paternal Grandfather    Cancer Paternal Uncle    Dementia Maternal Uncle 93   Colon cancer Neg Hx     Social History   Socioeconomic History   Marital status: Married    Spouse name: [name]   Number of children: Not on file   Years of education: 72   Highest education level: High school graduate  Occupational History   Occupation: Unemployed/retired    Comment: Clinical biochemist  Tobacco Use   Smoking status: Never    Passive exposure: Past   Smokeless tobacco: Never  Vaping Use   Vaping Use: Never used  Substance and Sexual Activity   Alcohol use: No   Drug use: No   Sexual activity: Yes    Birth control/protection: None  Other Topics Concern   Not on file  Social History Narrative   Lives with wife   Social Determinants of Health   Financial Resource Strain: Low  Risk  (08/20/2021)   Overall Financial Resource Strain (CARDIA)    Difficulty of Paying Living Expenses: Not hard at all  Food Insecurity: No Food Insecurity (08/20/2021)   Hunger Vital Sign    Worried About Running Out of Food in the Last Year: Never true    Ran Out of Food in the Last Year: Never true  Transportation Needs: No Transportation Needs (08/20/2021)   PRAPARE - Hydrologist (Medical): No    Lack of Transportation (Non-Medical): No  Physical Activity: Sufficiently Active (08/20/2021)   Exercise Vital Sign  Days of Exercise per Week: 3 days    Minutes of Exercise per Session: 60 min  Stress: No Stress Concern Present (08/20/2021)   [place]    Feeling of Stress : Not at all  Social Connections: [place] (08/20/2021)   Social Connection and Isolation Panel [NHANES]    Frequency of Communication with Friends and Family: More than three times a week    Frequency of Social Gatherings with Friends and Family: More than three times a week    Attends Religious Services: More than 4 times per year    Active Member of [employer] or Organizations: Yes    Attends Music therapist: More than 4 times per year    Marital Status: Married   Review of systems General: negative for malaise, night sweats, fever, chills, weight loss Neck: Negative for lumps, goiter, pain and significant neck swelling Resp: Negative for cough, wheezing, dyspnea at rest CV: Negative for chest pain, leg swelling, palpitations, orthopnea GI: denies melena, hematochezia, nausea, vomiting, diarrhea, constipation, dysphagia, odyonophagia, early satiety or unintentional weight loss.  MSK: Negative for joint pain or swelling, back pain, and muscle pain. Derm: Negative for itching or rash Psych: Denies depression, anxiety, memory loss, confusion. No homicidal or suicidal ideation.  Heme: Negative for  prolonged bleeding, bruising easily, and swollen nodes. Endocrine: Negative for cold or heat intolerance, polyuria, polydipsia and goiter. Neuro: negative for tremor, gait imbalance, syncope and seizures. The remainder of the review of systems is noncontributory.  Physical Exam: BP 115/81 (BP Location: Left Arm, Patient Position: Sitting, Cuff Size: Large)   Pulse 70   Temp 98.6 F (37 C) (Oral)   Ht 6' 1"  (1.854 m)   Wt 197 lb 6.4 oz (89.5 kg)   BMI 26.04 kg/m  General:   Alert and oriented. No distress noted. Pleasant and cooperative.  Head:  Normocephalic and atraumatic. Eyes:  Conjuctiva clear without scleral icterus. Mouth:  Oral mucosa pink and moist. Good dentition. No lesions. Heart: Normal rate and rhythm, s1 and s2 heart sounds present.  Lungs: Clear lung sounds in all lobes. Respirations equal and unlabored. Abdomen:  +BS, soft, non-tender and non-distended. No rebound or guarding. No HSM or masses noted. Derm: No palmar erythema or jaundice Msk:  Symmetrical without gross deformities. Normal posture. Extremities:  Without edema. Neurologic:  Alert and  oriented x4 Psych:  Alert and cooperative. Normal mood and affect.  Invalid input(s): "6 MONTHS"   ASSESSMENT: VADA YELLEN is a 66 y.o. male presenting today for follow up of UC and GERD.  UC doing well on sulfasalazine 1g BID, having 1-2 formed BMs per day without presence of blood, melena or mucus in stools. Denies abdominal pain. Weight is stable and appetite is good. Last labs in August with normal CBC and CMP. Last colonoscopy in August 2018, would recommend updating colonoscopy given 5 years since last and change in therapy 6 months ago. Indications, risks and benefits of procedure discussed in detail with patient and his wife who both verbalized understanding and are in agreement to proceed with Colonoscopy at this time.   GERD seems very well controlled on once daily protonix 42m. He is having no breakthrough  symptoms, nausea, vomiting, early satiety, dysphagia or odynophagia. Patient's wife inquires if he can come off of PPI. We discussed trial of every other day dosing to see how he does, if he continues to be without symptoms, can try stopping PPI altogether.  PLAN:  PPI every other day 2. Continue sulfasalazine 1g BID 3. Schedule colonoscopy ASA II  All questions were answered, patient verbalized understanding and is in agreement with plan as outlined above.   Follow Up: 6 months   Justin Burd L. Alver Sorrow, MSN, APRN, AGNP-C Adult-Gerontology Nurse Practitioner [facility]

## 2022-07-18 NOTE — Telephone Encounter (Signed)
Justin [name], CMA  ?

## 2022-07-21 ENCOUNTER — Ambulatory Visit (INDEPENDENT_AMBULATORY_CARE_PROVIDER_SITE_OTHER): Payer: Medicare PPO | Admitting: Gastroenterology

## 2022-07-28 ENCOUNTER — Ambulatory Visit (INDEPENDENT_AMBULATORY_CARE_PROVIDER_SITE_OTHER): Payer: Medicare PPO | Admitting: Gastroenterology

## 2022-08-10 ENCOUNTER — Encounter (INDEPENDENT_AMBULATORY_CARE_PROVIDER_SITE_OTHER): Payer: Self-pay

## 2022-08-18 ENCOUNTER — Ambulatory Visit: Payer: Medicare PPO | Admitting: Family Medicine

## 2022-08-18 VITALS — BP 126/80 | HR 88 | Temp 98.9°F

## 2022-08-18 DIAGNOSIS — J329 Chronic sinusitis, unspecified: Secondary | ICD-10-CM

## 2022-08-18 MED ORDER — AMOXICILLIN-POT CLAVULANATE 875-125 MG PO TABS: 1.0000 | ORAL_TABLET | Freq: Two times a day (BID) | ORAL | 0 refills | Status: DC

## 2022-08-18 NOTE — Progress Notes (Signed)
Subjective:    Patient ID: Justin [name], male    DOB: Nov 29, 1955, 66 y.o.   MRN: [id number]  Fall  Patient is a very pleasant 66 year old Caucasian gentleman with a history of dementia and also a history of stroke.  He presents today with his wife.  Symptoms began about 7 days ago with head congestion and rhinorrhea.  The rhinorrhea is steadily worsening.  Last night he had to sleep in a chair because he could not breathe due to the congestion in his nose and in his hands.  He is also coughing but the cough is nonproductive.  Over the last 3 days his symptoms have worsened.  He has also had a low-grade fever.  He fell out of the bed recently and has some tenderness over his right ribs in the midaxillary line there is no bruising and his lungs are clear. Past Medical History:  Diagnosis Date   Acid reflux 47/82/9562   Acute metabolic encephalopathy 13/06/6577   Anxiety    Arthritis    Cerebral embolism with cerebral infarction 04/15/2018   Chest pain 05/19/2015   Depression    EEG abnormality without seizure 05/19/2021   Encephalitis 04/30/2021   Encephalopathy due to COVID-19 virus    Episodic confusion 05/19/2021   Heart murmur    Hyperglycemia 05/19/2015   Hypertension    Left thalamic lacunar infarct 05/24/2018   OSA (obstructive sleep apnea)    cpap 9 cm H2O   Recurrent unilateral inguinal hernia with obstruction and without gangrene    SBO (small bowel obstruction)    Sleep apnea 3 years ago   Ulcerative colitis 09/24/2019   Vascular dementia without behavioral disturbance 05/19/2021   Past Surgical History:  Procedure Laterality Date   BIOPSY  06/21/2017   Procedure: BIOPSY;  Surgeon: Rogene Houston, MD;  [facility];  Service: Endoscopy;;  rectal, righta and left colon;   COLONOSCOPY N/A 06/21/2017   Procedure: COLONOSCOPY;  Surgeon: Rogene Houston, MD;  [facility];  Service: Endoscopy;  Laterality: N/A;  1:25   HERNIA REPAIR  3 years ago    INGUINAL HERNIA REPAIR Right 04/04/2018   Procedure: HERNIA REPAIR INGUINAL ADULT WITH MESH;  Surgeon: Aviva Signs, MD;  Location: [facility];  Service: General;  Laterality: Right;   INGUINAL HERNIA REPAIR Right 01/31/2019   Procedure: RECURRENT RIGHT INGUINAL HERNIORRHAPY WITH MESH, INCARCERATED;  Surgeon: Aviva Signs, MD;  Location: [facility];  Service: General;  Laterality: Right;   JOINT REPLACEMENT  July 19, 2021   Total knee repl.   KNEE SURGERY     tore mcl in high school (70's)   TOTAL KNEE ARTHROPLASTY Left 07/19/2021   Procedure: TOTAL KNEE ARTHROPLASTY;  Surgeon: Gaynelle Arabian, MD;  Location: [facility];  Service: Orthopedics;  Laterality: Left;   Current Outpatient Medications on File Prior to Visit  Medication Sig Dispense Refill   acetaminophen (TYLENOL) 500 MG tablet Take 500 mg by mouth every 6 (six) hours as needed (pain).     Ascorbic Acid (VITAMIN C) 500 MG CAPS Take 500 mg by mouth daily.     aspirin EC 81 MG tablet Take 81 mg by mouth daily. Swallow whole.     atorvastatin (LIPITOR) 40 MG tablet TAKE ONE TABLET BY MOUTH AT 6PM 90 tablet 1   [facility] B-12 500 MCG tablet Take 500 mcg by mouth daily.     diphenhydrAMINE (BENADRYL) 25 MG tablet Take 25 mg by mouth daily as needed  for allergies.     donepezil (ARICEPT) 5 MG tablet Take 1 tablet (5 mg total) by mouth at bedtime. 90 tablet 3   losartan (COZAAR) 100 MG tablet TAKE ONE (1) TABLET BY MOUTH EVERY DAY 90 tablet 2   pantoprazole (PROTONIX) 40 MG tablet Take 1 tablet (40 mg total) by mouth daily. NEEDS OFFICE VISIT 90 tablet 3   polyethylene glycol-electrolytes (TRILYTE) 420 g solution Take 4,000 mLs by mouth as directed. 4000 mL 0   escitalopram (LEXAPRO) 20 MG tablet Take 1 tablet (20 mg total) by mouth at bedtime. (Patient not taking: Reported on 08/18/2022) 90 tablet 3   sulfaSALAzine (AZULFIDINE) 500 MG tablet Take 2 tablets (1,000 mg total) by mouth 2 (two) times daily. 360 tablet 3   No current  facility-administered medications on file prior to visit.   No Known Allergies Social History   Socioeconomic History   Marital status: Married    Spouse name: [name]   Number of children: Not on file   Years of education: 12   Highest education level: High school graduate  Occupational History   Occupation: Unemployed/retired    Comment: Clinical biochemist  Tobacco Use   Smoking status: Never    Passive exposure: Past   Smokeless tobacco: Never  Vaping Use   Vaping Use: Never used  Substance and Sexual Activity   Alcohol use: No   Drug use: No   Sexual activity: Yes    Birth control/protection: None  Other Topics Concern   Not on file  Social History Narrative   Lives with wife   Social Determinants of Health   Financial Resource Strain: Low Risk  (08/20/2021)   Overall Financial Resource Strain (CARDIA)    Difficulty of Paying Living Expenses: Not hard at all  Food Insecurity: No Food Insecurity (08/20/2021)   Hunger Vital Sign    Worried About Running Out of Food in the Last Year: Never true    [place] in the Last Year: Never true  Transportation Needs: No Transportation Needs (08/20/2021)   PRAPARE - Hydrologist (Medical): No    Lack of Transportation (Non-Medical): No  Physical Activity: Sufficiently Active (08/20/2021)   Exercise Vital Sign    Days of Exercise per Week: 3 days    Minutes of Exercise per Session: 60 min  Stress: No Stress Concern Present (08/20/2021)   [place]    Feeling of Stress : Not at all  Social Connections: [place] (08/20/2021)   Social Connection and Isolation Panel [NHANES]    Frequency of Communication with Friends and Family: More than three times a week    Frequency of Social Gatherings with Friends and Family: More than three times a week    Attends Religious Services: More than 4 times per year    Active Member of  [employer] or Organizations: Yes    Attends Archivist Meetings: More than 4 times per year    Marital Status: Married  Human resources officer Violence: Not At Risk (08/20/2021)   Humiliation, Afraid, Rape, and Kick questionnaire    Fear of Current or Ex-Partner: No    Emotionally Abused: No    Physically Abused: No    Sexually Abused: No    Review of Systems     Objective:   Physical Exam Vitals reviewed.  Constitutional:      Appearance: Normal appearance. He is normal weight.  HENT:  Right Ear: Tympanic membrane and ear canal normal.     Left Ear: Tympanic membrane and ear canal normal.     Nose: Congestion and rhinorrhea present.     Mouth/Throat:     Pharynx: No oropharyngeal exudate or posterior oropharyngeal erythema.  Cardiovascular:     Rate and Rhythm: Normal rate and regular rhythm.     Heart sounds: Normal heart sounds. No murmur heard.    No friction rub. No gallop.  Pulmonary:     Effort: Pulmonary effort is normal. No respiratory distress.     Breath sounds: Normal breath sounds. No stridor.  Abdominal:     General: Bowel sounds are normal. There is no distension.     Palpations: Abdomen is soft.     Tenderness: There is no abdominal tenderness. There is no guarding.  Musculoskeletal:     Right lower leg: No edema.     Left lower leg: No edema.  Lymphadenopathy:     Cervical: No cervical adenopathy.  Skin:    Findings: No erythema or rash.  Neurological:     General: No focal deficit present.     Mental Status: He is alert and oriented to person, place, and time.     Cranial Nerves: No cranial nerve deficit.     Motor: No weakness.     Gait: Gait normal.         Assessment & Plan:  Rhinosinusitis Patient has sinusitis.  The question is whether this is due to allergies, virus, or bacteria.  He is now on day 7 with symptoms worsening over the last 3 days.  The congestion is worsening.  Given his age and his medical comorbidities I will go ahead  and start him on Augmentin 875 mg twice daily for 10 days and use a combination of Xyzal and Nasacort for nasal congestion and reassess in 1 week or sooner if worse

## 2022-08-25 ENCOUNTER — Encounter: Payer: Self-pay | Admitting: Family Medicine

## 2022-08-25 ENCOUNTER — Ambulatory Visit (INDEPENDENT_AMBULATORY_CARE_PROVIDER_SITE_OTHER): Payer: Medicare PPO | Admitting: Family Medicine

## 2022-08-25 DIAGNOSIS — Z23 Encounter for immunization: Secondary | ICD-10-CM

## 2022-08-25 DIAGNOSIS — Z0000 Encounter for general adult medical examination without abnormal findings: Secondary | ICD-10-CM | POA: Diagnosis not present

## 2022-08-25 NOTE — Progress Notes (Signed)
Subjective:   Justin [name] is a 66 y.o. male who presents for Medicare Annual/Subsequent preventive examination.  Review of Systems    Neurological Hypertension Cardiac Risk Factors include: male gender     Objective:   There is no height or weight on file to calculate BMI.     08/25/2022   11:18 AM 01/26/2022   11:11 AM 08/20/2021   10:56 AM 07/19/2021    5:43 PM 07/08/2021    9:33 AM 04/30/2021    1:05 PM 01/31/2019    7:21 AM  Advanced Directives  Does Patient Have a Medical Advance Directive? Yes No No No No No No  Type of Paramedic of Man;Living will        Does patient want to make changes to medical advance directive?    Yes (MAU/Ambulatory/Procedural Areas - Information given) Yes (MAU/Ambulatory/Procedural Areas - Information given)    Copy of [place] in Chart? No - copy requested        Would patient like information on creating a medical advance directive?   No - Patient declined No - Patient declined  No - Patient declined No - Patient declined   There were no vitals filed for this visit.    Current Medications (verified) Outpatient Encounter Medications as of 08/25/2022  Medication Sig   acetaminophen (TYLENOL) 500 MG tablet Take 500 mg by mouth every 6 (six) hours as needed (pain).   amoxicillin-clavulanate (AUGMENTIN) 875-125 MG tablet Take 1 tablet by mouth 2 (two) times daily.   Ascorbic Acid (VITAMIN C) 500 MG CAPS Take 500 mg by mouth daily.   aspirin EC 81 MG tablet Take 81 mg by mouth daily. Swallow whole.   atorvastatin (LIPITOR) 40 MG tablet TAKE ONE TABLET BY MOUTH AT 6PM   [employer]S B-12 500 MCG tablet Take 500 mcg by mouth daily.   diphenhydrAMINE (BENADRYL) 25 MG tablet Take 25 mg by mouth daily as needed for allergies.   donepezil (ARICEPT) 5 MG tablet Take 1 tablet (5 mg total) by mouth at bedtime.   escitalopram (LEXAPRO) 20 MG tablet Take 1 tablet (20 mg total) by mouth at bedtime.   losartan  (COZAAR) 100 MG tablet TAKE ONE (1) TABLET BY MOUTH EVERY DAY   pantoprazole (PROTONIX) 40 MG tablet Take 1 tablet (40 mg total) by mouth daily. NEEDS OFFICE VISIT   polyethylene glycol-electrolytes (TRILYTE) 420 g solution Take 4,000 mLs by mouth as directed.   sulfaSALAzine (AZULFIDINE) 500 MG tablet Take 2 tablets (1,000 mg total) by mouth 2 (two) times daily.   No facility-administered encounter medications on file as of 08/25/2022.    Allergies (verified) Patient has no known allergies.   History: Past Medical History:  Diagnosis Date   Acid reflux 34/28/7681   Acute metabolic encephalopathy 15/72/6203   Anxiety    Arthritis    Cerebral embolism with cerebral infarction 04/15/2018   Chest pain 05/19/2015   Depression    EEG abnormality without seizure 05/19/2021   Encephalitis 04/30/2021   Encephalopathy due to COVID-19 virus    Episodic confusion 05/19/2021   Heart murmur    Hyperglycemia 05/19/2015   Hypertension    Left thalamic lacunar infarct 05/24/2018   OSA (obstructive sleep apnea)    cpap 9 cm H2O   Recurrent unilateral inguinal hernia with obstruction and without gangrene    SBO (small bowel obstruction)    Sleep apnea 3 years ago   Ulcerative colitis 09/24/2019   Vascular  dementia without behavioral disturbance 05/19/2021   Past Surgical History:  Procedure Laterality Date   BIOPSY  06/21/2017   Procedure: BIOPSY;  Surgeon: Rogene Houston, MD;  [facility];  Service: Endoscopy;;  rectal, righta and left colon;   COLONOSCOPY N/A 06/21/2017   Procedure: COLONOSCOPY;  Surgeon: Rogene Houston, MD;  [facility];  Service: Endoscopy;  Laterality: N/A;  1:25   HERNIA REPAIR  3 years ago   INGUINAL HERNIA REPAIR Right 04/04/2018   Procedure: HERNIA REPAIR INGUINAL ADULT WITH MESH;  Surgeon: Aviva Signs, MD;  Location: [facility];  Service: General;  Laterality: Right;   INGUINAL HERNIA REPAIR Right 01/31/2019   Procedure: RECURRENT  RIGHT INGUINAL HERNIORRHAPY WITH MESH, INCARCERATED;  Surgeon: Aviva Signs, MD;  Location: [facility];  Service: General;  Laterality: Right;   JOINT REPLACEMENT  July 19, 2021   Total knee repl.   KNEE SURGERY     tore mcl in high school (70's)   TOTAL KNEE ARTHROPLASTY Left 07/19/2021   Procedure: TOTAL KNEE ARTHROPLASTY;  Surgeon: Gaynelle Arabian, MD;  Location: [facility];  Service: Orthopedics;  Laterality: Left;   Family History  Problem Relation Age of Onset   Tuberculosis Mother    Stroke Mother    Arthritis Mother    Cancer Mother    Heart disease Father        died at 17   Early death Father    Bradycardia Brother        pacemaker   Stroke Brother    Hypertension Brother    Mental illness Paternal Grandfather        suicide at 80   Suicidality Paternal Grandfather    Cancer Paternal Uncle    Dementia Maternal Uncle 93   Colon cancer Neg Hx    Social History   Socioeconomic History   Marital status: Married    Spouse name: [name]   Number of children: Not on file   Years of education: 11   Highest education level: High school graduate  Occupational History   Occupation: Unemployed/retired    Comment: Clinical biochemist  Tobacco Use   Smoking status: Never    Passive exposure: Past   Smokeless tobacco: Never  Vaping Use   Vaping Use: Never used  Substance and Sexual Activity   Alcohol use: No   Drug use: No   Sexual activity: Yes    Birth control/protection: None  Other Topics Concern   Not on file  Social History Narrative   Lives with wife   Social Determinants of Health   Financial Resource Strain: Low Risk  (08/25/2022)   Overall Financial Resource Strain (CARDIA)    Difficulty of Paying Living Expenses: Not hard at all  Food Insecurity: No Food Insecurity (08/25/2022)   Hunger Vital Sign    Worried About Running Out of Food in the Last Year: Never true    Ran Out of Food in the Last Year: Never true  Transportation Needs: No Transportation Needs  (08/25/2022)   PRAPARE - Hydrologist (Medical): No    Lack of Transportation (Non-Medical): No  Physical Activity: Sufficiently Active (08/25/2022)   Exercise Vital Sign    Days of Exercise per Week: 7 days    Minutes of Exercise per Session: 30 min  Stress: No Stress Concern Present (08/25/2022)   [place]    Feeling of Stress : Only a little  Social Connections: Moderately Integrated (08/25/2022)   Social Connection and Isolation Panel [NHANES]    Frequency of Communication with Friends and Family: Once a week    Frequency of Social Gatherings with Friends and Family: Once a week    Attends Religious Services: 1 to 4 times per year    Active Member of [employer] or Organizations: Yes    Attends Archivist Meetings: 1 to 4 times per year    Marital Status: Married    Tobacco Counseling Counseling given: Not Answered   Clinical Intake:  Pre-visit preparation completed: Yes  Pain : No/denies pain     BMI - recorded: 26.05 Nutritional Status: BMI of 19-24  Normal Nutritional Risks: Other (Comment) (seems like pt is not eating as usual) Diabetes: No  How often do you need to have someone help you when you read instructions, pamphlets, or other written materials from your doctor or pharmacy?: 1 - Never What is the last grade level you completed in school?: 12  Diabetic?no  Interpreter Needed?: No      Activities of Daily Living    08/25/2022   11:21 AM 08/22/2022   10:23 AM  In your present state of health, do you have any difficulty performing the following activities:  Hearing? 0 0  Vision? 1 0  Comment sometimes/wears glasses   Difficulty concentrating or making decisions? 0 1  Walking or climbing stairs? 0 0  Dressing or bathing? 0 0  Doing errands, shopping? 1 1  Comment unable to drive   Preparing Food and eating ? Y N  Using the Toilet? Y N  In the  past six months, have you accidently leaked urine? N N  Do you have problems with loss of bowel control? N N  Managing your Medications? Tempie Donning  Comment wife manage meds   Managing your Finances? Y N  Comment can do manage however wife does most of the finances   Housekeeping or managing your Housekeeping? N N    Patient Care Team: [name], MD as PCP - General (Family Medicine)  Indicate any recent Medical Services you may have received from other than [facility] providers in the past year (date may be approximate).     Assessment:   This is a routine wellness examination for Rak[place].  Hearing/Vision screen No results found.  Dietary issues and exercise activities discussed:     Goals Addressed             This Visit's Progress    Exercise daily (30 min per time)         Depression Screen    08/25/2022   11:16 AM 08/20/2021   10:51 AM 12/21/2020    9:02 AM 05/18/2020   11:37 AM 01/14/2020   10:04 AM 01/14/2020   10:03 AM 09/26/2019    4:47 PM  PHQ 2/9 Scores  PHQ - 2 Score 0 1 0 0 1 1 2   PHQ- 9 Score  2  6 5  5     Fall Risk    08/25/2022   11:21 AM 08/22/2022   10:23 AM 08/20/2021   10:57 AM 12/21/2020    9:02 AM 08/23/2018   10:33 AM  Fall Risk   Falls in the past year? 0 0 0 0 No  Number falls in past yr: 0 0 0 0   Injury with Fall? 0  0 0   Risk for fall due to :   Impaired mobility    Follow  up   Falls prevention discussed      FALL RISK PREVENTION PERTAINING TO THE HOME:  Any stairs in or around the home? Yes  If so, are there any without handrails? Yes  Home free of loose throw rugs in walkways, pet beds, electrical cords, etc? Yes  Adequate lighting in your home to reduce risk of falls? Yes   ASSISTIVE DEVICES UTILIZED TO PREVENT FALLS:  Life alert? Yes  Use of a cane, walker or w/c? Yes  Grab bars in the bathroom?  N/a Shower chair or bench in shower? Yes  Elevated toilet seat or a handicapped toilet? No   TIMED UP AND GO:  Was the  test performed? Yes .  Length of time to ambulate 10 feet: 5 sec.   Gait steady and fast with assistive device  Cognitive Function:    03/14/2022    2:22 PM 09/27/2021    9:05 AM 05/19/2021    3:27 PM 03/29/2021    9:55 AM 09/28/2020    9:38 AM  MMSE - Mini Mental State Exam  Orientation to time 3 3 2 3 4   Orientation to Place 4 4 4 5 5   Registration 3 3 3 3 3   Attention/ Calculation 0 1 1 5 2   Recall 0 0 0 0 0  Language- name 2 objects 2 2 2 2 2   Language- repeat 1 1 1 1 1   Language- follow 3 step command 3 3 2 3 3   Language- read & follow direction 1 1 0 1 1  Write a sentence 1 1 1 1 1   Copy design 0 1 0 0 0  Copy design-comments     6 animals  Total score 18 20 16 24 22         08/25/2022   11:29 AM 08/20/2021   11:03 AM  6CIT Screen  What Year? 0 points 0 points  What month? 3 points 0 points  What time? 0 points 0 points  Count back from 20 0 points 0 points  Months in reverse 4 points 4 points  Repeat phrase 10 points 6 points  Total Score 17 points 10 points    Immunizations Immunization History  Administered Date(s) Administered   Influenza Split 09/05/2015   Influenza,inj,Quad PF,6+ Mos 09/13/2018   Influenza,inj,quad, With Preservative 09/07/2017, 09/13/2018, 08/17/2019   Influenza,trivalent, recombinat, inj, PF 08/05/2017   Tdap 11/07/2008    TDAP status: Due, Education has been provided regarding the importance of this vaccine. Advised may receive this vaccine at local pharmacy or Health Dept. Aware to provide a copy of the vaccination record if obtained from local pharmacy or Health Dept. Verbalized acceptance and understanding.  Flu Vaccine status: Completed at today's visit  Pneumococcal vaccine status: Completed during today's visit.  Covid-19 vaccine status: Declined, Education has been provided regarding the importance of this vaccine but patient still declined. Advised may receive this vaccine at local pharmacy or Health Dept.or vaccine clinic.  Aware to provide a copy of the vaccination record if obtained from local pharmacy or Health Dept. Verbalized acceptance and understanding.  Qualifies for Shingles Vaccine? No   Zostavax completed No   Shingrix Completed?: No.    Education has been provided regarding the importance of this vaccine. Patient has been advised to call insurance company to determine out of pocket expense if they have not yet received this vaccine. Advised may also receive vaccine at local pharmacy or Health Dept. Verbalized acceptance and understanding.  Screening Tests Health Maintenance  Topic Date Due   Pneumonia Vaccine 59+ Years old (1 - PCV) 09/03/2022 (Originally 05/06/2020)   Hepatitis C Screening  10/05/2022 (Originally 05/06/1974)   TETANUS/TDAP  10/06/2022 (Originally 11/07/2018)   Zoster Vaccines- Shingrix (1 of 2) 10/06/2022 (Originally 05/06/2005)   INFLUENZA VACCINE  02/05/2023 (Originally 06/07/2022)   COLONOSCOPY (Pts 45-62yr Insurance coverage will need to be confirmed)  06/22/2027   HPV VACCINES  Aged Out   COVID-19 Vaccine  Discontinued    Health Maintenance  There are no preventive care reminders to display for this patient.     Lung Cancer Screening: (Low Dose CT Chest recommended if Age 67-80years, 30 pack-year currently smoking OR have quit w/in 15years.) does not qualify.   Lung Cancer Screening Referral: n/a  Additional Screening:  Hepatitis C Screening: does not qualify; Completed n/a  Vision Screening: Recommended annual ophthalmology exams for early detection of glaucoma and other disorders of the eye. Is the patient up to date with their annual eye exam?  Yes  Who is the provider or what is the name of the office in which the patient attends annual eye exams? [facility]If pt is not established with a provider, would they like to be referred to a provider to establish care? No .   Dental Screening: Recommended annual dental exams for proper oral  hygiene  Community Resource Referral / Chronic Care Management: CRR required this visit?  No   CCM required this visit?  No      Plan:     I have personally reviewed and noted the following in the patient's chart:   Medical and social history Use of alcohol, tobacco or illicit drugs  Current medications and supplements including opioid prescriptions. Patient is not currently taking opioid prescriptions. Functional ability and status Nutritional status Physical activity Advanced directives List of other physicians Hospitalizations, surgeries, and ER visits in previous 12 months Vitals Screenings to include cognitive, depression, and falls Referrals and appointments  In addition, I have reviewed and discussed with patient certain preventive protocols, quality metrics, and best practice recommendations. A written personalized care plan for preventive services as well as general preventive health recommendations were provided to patient.     AMila Merry FNP-C   08/25/2022   Nurse Notes: n/a

## 2022-08-26 ENCOUNTER — Other Ambulatory Visit (INDEPENDENT_AMBULATORY_CARE_PROVIDER_SITE_OTHER): Payer: Medicare PPO

## 2022-08-26 DIAGNOSIS — Z23 Encounter for immunization: Secondary | ICD-10-CM

## 2022-08-31 ENCOUNTER — Ambulatory Visit (HOSPITAL_COMMUNITY): Payer: Medicare PPO | Admitting: Anesthesiology

## 2022-08-31 ENCOUNTER — Ambulatory Visit (HOSPITAL_BASED_OUTPATIENT_CLINIC_OR_DEPARTMENT_OTHER): Payer: Medicare PPO | Admitting: Anesthesiology

## 2022-08-31 ENCOUNTER — Encounter (HOSPITAL_COMMUNITY)
Admission: RE | Disposition: A | Discharge status: Home / Self Care | Payer: Self-pay | Source: Home / Self Care | Attending: Gastroenterology

## 2022-08-31 ENCOUNTER — Encounter (HOSPITAL_COMMUNITY): Payer: Self-pay | Admitting: Gastroenterology

## 2022-08-31 ENCOUNTER — Ambulatory Visit (HOSPITAL_COMMUNITY)
Admission: RE | Admit: 2022-08-31 | Discharge: 2022-08-31 | Disposition: A | Discharge status: Home / Self Care | Payer: Medicare PPO | Attending: Gastroenterology | Admitting: Gastroenterology

## 2022-08-31 ENCOUNTER — Other Ambulatory Visit: Payer: Self-pay

## 2022-08-31 DIAGNOSIS — Z8673 Personal history of transient ischemic attack (TIA), and cerebral infarction without residual deficits: Secondary | ICD-10-CM | POA: Diagnosis not present

## 2022-08-31 DIAGNOSIS — Z79899 Other long term (current) drug therapy: Secondary | ICD-10-CM | POA: Diagnosis not present

## 2022-08-31 DIAGNOSIS — K219 Gastro-esophageal reflux disease without esophagitis: Secondary | ICD-10-CM | POA: Insufficient documentation

## 2022-08-31 DIAGNOSIS — F0154 Vascular dementia, unspecified severity, with anxiety: Secondary | ICD-10-CM | POA: Insufficient documentation

## 2022-08-31 DIAGNOSIS — K648 Other hemorrhoids: Secondary | ICD-10-CM | POA: Insufficient documentation

## 2022-08-31 DIAGNOSIS — I10 Essential (primary) hypertension: Secondary | ICD-10-CM | POA: Diagnosis not present

## 2022-08-31 DIAGNOSIS — K5730 Diverticulosis of large intestine without perforation or abscess without bleeding: Secondary | ICD-10-CM

## 2022-08-31 DIAGNOSIS — F418 Other specified anxiety disorders: Secondary | ICD-10-CM

## 2022-08-31 DIAGNOSIS — Z8249 Family history of ischemic heart disease and other diseases of the circulatory system: Secondary | ICD-10-CM | POA: Insufficient documentation

## 2022-08-31 DIAGNOSIS — K5100 Ulcerative (chronic) pancolitis without complications: Secondary | ICD-10-CM | POA: Insufficient documentation

## 2022-08-31 DIAGNOSIS — Z823 Family history of stroke: Secondary | ICD-10-CM | POA: Insufficient documentation

## 2022-08-31 DIAGNOSIS — F0153 Vascular dementia, unspecified severity, with mood disturbance: Secondary | ICD-10-CM | POA: Diagnosis not present

## 2022-08-31 DIAGNOSIS — Z8711 Personal history of peptic ulcer disease: Secondary | ICD-10-CM | POA: Insufficient documentation

## 2022-08-31 DIAGNOSIS — G4733 Obstructive sleep apnea (adult) (pediatric): Secondary | ICD-10-CM | POA: Diagnosis not present

## 2022-08-31 HISTORY — PX: COLONOSCOPY WITH PROPOFOL: SHX5780

## 2022-08-31 LAB — HM COLONOSCOPY

## 2022-08-31 SURGERY — COLONOSCOPY WITH PROPOFOL
Anesthesia: General

## 2022-08-31 MED ORDER — LACTATED RINGERS IV SOLN: INTRAVENOUS | Status: DC

## 2022-08-31 MED ORDER — LIDOCAINE HCL 1 % IJ SOLN
INTRAMUSCULAR | Status: DC | PRN
  Administered 2022-08-31: 50 mg via INTRADERMAL

## 2022-08-31 MED ORDER — PROPOFOL 10 MG/ML IV BOLUS
INTRAVENOUS | Status: DC | PRN
  Administered 2022-08-31: 100 mg via INTRAVENOUS

## 2022-08-31 MED ORDER — PROPOFOL 500 MG/50ML IV EMUL
INTRAVENOUS | Status: DC | PRN
  Administered 2022-08-31: 150 ug/kg/min via INTRAVENOUS

## 2022-08-31 MED ORDER — LACTATED RINGERS IV SOLN: INTRAVENOUS | Status: DC | PRN

## 2022-08-31 NOTE — Anesthesia Postprocedure Evaluation (Signed)
Anesthesia Post Note  Patient: ADRYEN COOKSON  Procedure(s) Performed: COLONOSCOPY WITH PROPOFOL  Patient location during evaluation: Endoscopy Anesthesia Type: General Level of consciousness: awake and alert Pain management: pain level controlled Vital Signs Assessment: post-procedure vital signs reviewed and stable Respiratory status: spontaneous breathing Cardiovascular status: blood pressure returned to baseline and stable Postop Assessment: no apparent nausea or vomiting Anesthetic complications: no   No notable events documented.   Last Vitals:  Vitals:   08/31/22 1137  BP: (!) 156/92  Pulse: 90  Resp: 13  Temp: 36.4 C  SpO2: 99%    Last Pain:  Vitals:   08/31/22 1315  TempSrc:   PainSc: 0-No pain                 Deitrich Steve

## 2022-08-31 NOTE — Addendum Note (Signed)
Addendum  created 08/31/22 1426 by Ollen Bowl, CRNA   Clinical Note Signed, Intraprocedure Flowsheets edited

## 2022-08-31 NOTE — Transfer of Care (Addendum)
Immediate Anesthesia Transfer of Care Note  Patient: Justin [name]  Procedure(s) Performed: COLONOSCOPY WITH PROPOFOL  Patient Location: Endoscopy Unit  Anesthesia Type:General  Level of Consciousness: awake  Airway & Oxygen Therapy: Patient Spontanous Breathing  Post-op Assessment: Report given to RN  Post vital signs: Reviewed  Last Vitals:  Vitals Value Taken Time  BP 120/71   Temp 36.3   Pulse 77   Resp 19   SpO2 97     Last Pain:  Vitals:   08/31/22 1315  TempSrc:   PainSc: 0-No pain      Patients Stated Pain Goal: 7 (08/05/56 4734)  Complications: No notable events documented.

## 2022-08-31 NOTE — H&P (Signed)
Justin [name] is an 66 y.o. male.   Chief Complaint: ulcerative colitis HPI: Justin [name] is a 66 y.o. male with past medical history of previous stroke, GERD, ulcerative colitis, vascular dementia, hypertension, hyperlipidemia, coming for follow up of UC.  The patient denies having any nausea, vomiting, fever, chills, hematochezia, melena, hematemesis, abdominal distention, abdominal pain, diarrhea, jaundice, pruritus or weight loss. Taking sulfazalazine BID and tolerating it well. Has 1-2 Bms per day without blood.  Past Medical History:  Diagnosis Date   Acid reflux 09/98/3382   Acute metabolic encephalopathy 50/53/9767   Anxiety    Arthritis    Cerebral embolism with cerebral infarction 04/15/2018   Chest pain 05/19/2015   Depression    EEG abnormality without seizure 05/19/2021   Encephalitis 04/30/2021   Encephalopathy due to COVID-19 virus    Episodic confusion 05/19/2021   Heart murmur    Hyperglycemia 05/19/2015   Hypertension    Left thalamic lacunar infarct 05/24/2018   OSA (obstructive sleep apnea)    cpap 9 cm H2O   Recurrent unilateral inguinal hernia with obstruction and without gangrene    SBO (small bowel obstruction)    Sleep apnea 3 years ago   Ulcerative colitis 09/24/2019   Vascular dementia without behavioral disturbance 05/19/2021    Past Surgical History:  Procedure Laterality Date   BIOPSY  06/21/2017   Procedure: BIOPSY;  Surgeon: Rogene Houston, MD;  Location: [facility];  Service: Endoscopy;;  rectal, righta and left colon;   COLONOSCOPY N/A 06/21/2017   Procedure: COLONOSCOPY;  Surgeon: Rogene Houston, MD;  Location: [facility];  Service: Endoscopy;  Laterality: N/A;  1:25   HERNIA REPAIR  3 years ago   INGUINAL HERNIA REPAIR Right 04/04/2018   Procedure: HERNIA REPAIR INGUINAL ADULT WITH MESH;  Surgeon: Aviva Signs, MD;  Location: [facility];  Service: General;  Laterality: Right;   INGUINAL HERNIA REPAIR Right 01/31/2019   Procedure:  RECURRENT RIGHT INGUINAL HERNIORRHAPY WITH MESH, INCARCERATED;  Surgeon: Aviva Signs, MD;  Location: [facility];  Service: General;  Laterality: Right;   JOINT REPLACEMENT  July 19, 2021   Total knee repl.   KNEE SURGERY     tore mcl in high school (70's)   TOTAL KNEE ARTHROPLASTY Left 07/19/2021   Procedure: TOTAL KNEE ARTHROPLASTY;  Surgeon: Gaynelle Arabian, MD;  Location: [facility];  Service: Orthopedics;  Laterality: Left;    Family History  Problem Relation Age of Onset   Tuberculosis Mother    Stroke Mother    Arthritis Mother    Cancer Mother    Heart disease Father        died at 44   Early death Father    Bradycardia Brother        pacemaker   Stroke Brother    Hypertension Brother    Mental illness Paternal Grandfather        suicide at 14   Suicidality Paternal Grandfather    Cancer Paternal Uncle    Dementia Maternal Uncle 93   Colon cancer Neg Hx    Social History:  reports that he has never smoked. He has been exposed to tobacco smoke. He has never used smokeless tobacco. He reports that he does not drink alcohol and does not use drugs.  Allergies: No Known Allergies  Medications Prior to Admission  Medication Sig Dispense Refill   Ascorbic Acid (VITAMIN C) 500 MG CAPS Take 500 mg by mouth daily.     aspirin  EC 81 MG tablet Take 81 mg by mouth daily. Swallow whole.     atorvastatin (LIPITOR) 40 MG tablet TAKE ONE TABLET BY MOUTH AT 6PM 90 tablet 1   [employer] B-12 500 MCG tablet Take 500 mcg by mouth daily.     diphenhydrAMINE (BENADRYL) 25 MG tablet Take 25 mg by mouth daily as needed for allergies.     folic acid (FOLVITE) 1 MG tablet Take 1 mg by mouth daily.     losartan (COZAAR) 100 MG tablet TAKE ONE (1) TABLET BY MOUTH EVERY DAY 90 tablet 2   pantoprazole (PROTONIX) 40 MG tablet Take 1 tablet (40 mg total) by mouth daily. NEEDS OFFICE VISIT (Patient taking differently: Take 40 mg by mouth every other day. NEEDS OFFICE VISIT) 90 tablet 3   polyethylene  glycol-electrolytes (TRILYTE) 420 g solution Take 4,000 mLs by mouth as directed. 4000 mL 0   sulfaSALAzine (AZULFIDINE) 500 MG tablet Take 2 tablets (1,000 mg total) by mouth 2 (two) times daily. 360 tablet 3   amoxicillin-clavulanate (AUGMENTIN) 875-125 MG tablet Take 1 tablet by mouth 2 (two) times daily. (Patient not taking: Reported on 08/29/2022) 20 tablet 0   donepezil (ARICEPT) 5 MG tablet Take 1 tablet (5 mg total) by mouth at bedtime. 90 tablet 3   escitalopram (LEXAPRO) 20 MG tablet Take 1 tablet (20 mg total) by mouth at bedtime. (Patient not taking: Reported on 08/29/2022) 90 tablet 3    No results found for this or any previous visit (from the past 48 hour(s)). No results found.  Review of Systems  All other systems reviewed and are negative.   Blood pressure (!) 156/92, pulse 90, temperature 97.6 F (36.4 C), temperature source Oral, resp. rate 13, height 6' 1"  (1.854 m), weight 86.2 kg, SpO2 99 %. Physical Exam  GENERAL: The patient is AO x3, in no acute distress. HEENT: Head is normocephalic and atraumatic. EOMI are intact. Mouth is well hydrated and without lesions. NECK: Supple. No masses LUNGS: Clear to auscultation. No presence of rhonchi/wheezing/rales. Adequate chest expansion HEART: RRR, normal s1 and s2. ABDOMEN: Soft, nontender, no guarding, no peritoneal signs, and nondistended. BS +. No masses. EXTREMITIES: Without any cyanosis, clubbing, rash, lesions or edema. NEUROLOGIC: AOx3, no focal motor deficit. SKIN: no jaundice, no rashes  Assessment/Plan Justin [name] is a 66 y.o. male with past medical history of previous stroke, GERD, ulcerative colitis, vascular dementia, hypertension, hyperlipidemia, coming for follow up of UC. We will proceed with colonoscopy.  Harvel Quale, MD 08/31/2022, 11:39 AM

## 2022-08-31 NOTE — Op Note (Signed)
[facility] Patient Name: Justin [name] Procedure Date: 08/31/2022 12:32 PM MRN: [id number] Date of Birth: [birth year] Attending MD: Maylon Peppers , , [id number] CSN: [id number] Age: 66 Admit Type: Outpatient Procedure:                Colonoscopy Indications:              Follow-up of chronic ulcerative pancolitis Providers:                Maylon Peppers, Charlsie Quest. Theda Sers RN, RN, Ladoris Gene Technician, Technician Referring MD:             Maylon Peppers Medicines:                Monitored Anesthesia Care Complications:            No immediate complications. Estimated Blood Loss:     Estimated blood loss: none. Procedure:                Pre-Anesthesia Assessment:                           - Prior to the procedure, a History and Physical                            was performed, and patient medications, allergies                            and sensitivities were reviewed. The patient's                            tolerance of previous anesthesia was reviewed.                           - The risks and benefits of the procedure and the                            sedation options and risks were discussed with the                            patient. All questions were answered and informed                            consent was obtained.                           - ASA Grade Assessment: III - A patient with severe                            systemic disease.                           After obtaining informed consent, the colonoscope                            was passed under direct vision. Throughout  the                            procedure, the patient's blood pressure, pulse, and                            oxygen saturations were monitored continuously. The                            [id number] ([id number]) was introduced through the                            anus and advanced to the the terminal ileum. The                            colonoscopy was  performed without difficulty. The                            patient tolerated the procedure well. The quality                            of the bowel preparation was excellent. Scope In: 1:16:29 PM Scope Out: 1:38:09 PM Scope Withdrawal Time: 0 hours 17 minutes 3 seconds  Total Procedure Duration: 0 hours 21 minutes 40 seconds  Findings:      The perianal and digital rectal examinations were normal.      The terminal ileum appeared normal.      Multiple large-mouthed and small-mouthed diverticula were found in the       sigmoid colon, descending colon and transverse colon.      Inflammation was not found based on the endoscopic appearance of the       mucosa in the colon. This was graded as Mayo Score 0 (normal or inactive       disease), and when compared to the previous examination, the findings       are improved.      Non-bleeding internal hemorrhoids were found during retroflexion. The       hemorrhoids were medium-sized. Impression:               - The examined portion of the ileum was normal.                           - Diverticulosis in the sigmoid colon, in the                            descending colon and in the transverse colon.                           - Inactive (Mayo Score 0) ulcerative colitis, in                            remission, improved since the last examination.                           - Non-bleeding internal hemorrhoids.                           -  No specimens collected. Moderate Sedation:      Per Anesthesia Care Recommendation:           - Discharge patient to home (ambulatory).                           - Resume previous diet.                           - Repeat colonoscopy in 3 years for surveillance.                           -Continue sulfasalazine 1g BID and folic acid 1 mg                            daily. Procedure Code(s):        --- Professional ---                           [phone], Colonoscopy, flexible; diagnostic, including                             collection of specimen(s) by brushing or washing,                            when performed (separate procedure) Diagnosis Code(s):        --- Professional ---                           K64.8, Other hemorrhoids                           K51.00, Ulcerative (chronic) pancolitis without                            complications                           K57.30, Diverticulosis of large intestine without                            perforation or abscess without bleeding CPT copyright 2022 [employer]. All rights reserved. The codes documented in this report are preliminary and upon coder review may  be revised to meet current compliance requirements. Maylon Peppers, MD Maylon Peppers,  08/31/2022 1:45:27 PM This report has been signed electronically. Number of Addenda: 0

## 2022-08-31 NOTE — Anesthesia Preprocedure Evaluation (Signed)
Anesthesia Evaluation  Patient identified by MRN, date of birth, ID band Patient awake    Reviewed: Allergy & Precautions, H&P , NPO status , Patient's Chart, lab work & pertinent test results, reviewed documented beta blocker date and time   Airway Mallampati: II  TM Distance: >3 FB Neck ROM: full    Dental no notable dental hx.    Pulmonary sleep apnea and Continuous Positive Airway Pressure Ventilation ,    Pulmonary exam normal breath sounds clear to auscultation       Cardiovascular Exercise Tolerance: Good hypertension, negative cardio ROS   Rhythm:regular Rate:Normal     Neuro/Psych PSYCHIATRIC DISORDERS Anxiety Depression Dementia CVA, Residual Symptoms    GI/Hepatic Neg liver ROS, PUD, GERD  Medicated,  Endo/Other  negative endocrine ROS  Renal/GU negative Renal ROS  negative genitourinary   Musculoskeletal   Abdominal   Peds  Hematology negative hematology ROS (+)   Anesthesia Other Findings   Reproductive/Obstetrics negative OB ROS                             Anesthesia Physical Anesthesia Plan  ASA: 3  Anesthesia Plan: General   Post-op Pain Management:    Induction:   PONV Risk Score and Plan: Propofol infusion  Airway Management Planned:   Additional Equipment:   Intra-op Plan:   Post-operative Plan:   Informed Consent: I have reviewed the patients History and Physical, chart, labs and discussed the procedure including the risks, benefits and alternatives for the proposed anesthesia with the patient or authorized representative who has indicated his/her understanding and acceptance.     Dental Advisory Given  Plan Discussed with: CRNA  Anesthesia Plan Comments:         Anesthesia Quick Evaluation

## 2022-08-31 NOTE — Discharge Instructions (Addendum)
You are being discharged to home.  Resume your previous diet.  Your physician has recommended a repeat colonoscopy in three years for surveillance.  Continue sulfasalazine 1g BID and folic acid 1 mg daily.

## 2022-09-01 ENCOUNTER — Encounter (INDEPENDENT_AMBULATORY_CARE_PROVIDER_SITE_OTHER): Payer: Self-pay | Admitting: *Deleted

## 2022-09-03 IMAGING — MR MR HEAD W/O CM
11 of 12 series · 40 of 48 positions shown · non-contrast
Comparison: 06/07/2021.  05/01/2021.  04/30/2021.  05/29/2020.

CLINICAL DATA: Transient ischemic attack.

EXAM:
MRI HEAD WITHOUT CONTRAST
TECHNIQUE: Multiplanar, multiecho pulse sequences of the brain and surrounding
structures were obtained without intravenous contrast.

[Series 5: DWI · axial · 4.0mm · 0.88mm/px · z∈[+0,+138]mm · 4 of 36 slices shown (1 of 6)]
[im 1/36]
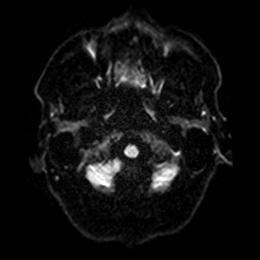
[im 12/36]
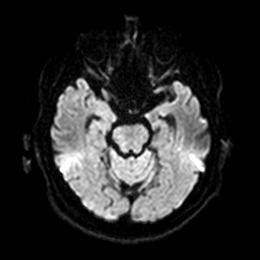
[im 24/36]
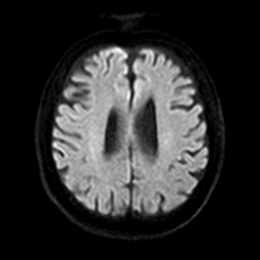
[im 36/36]
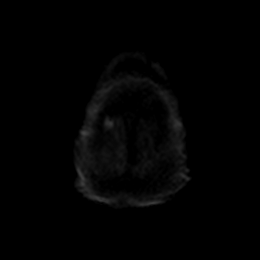

[Series 5: DWI · axial · 4.0mm · 0.88mm/px · z∈[+0,+138]mm · 4 of 36 slices shown (2 of 6)]
[im 1/36]
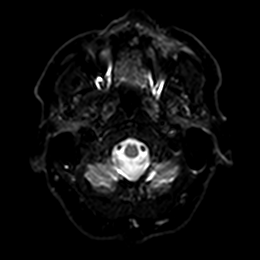
[im 12/36]
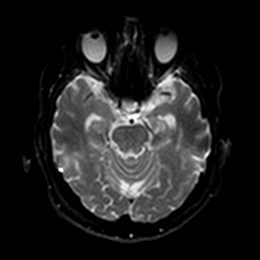
[im 24/36]
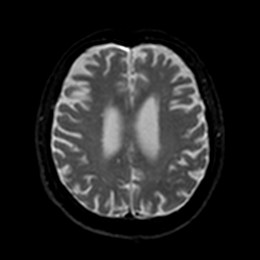
[im 36/36]
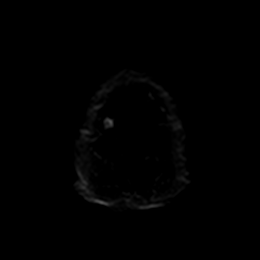

[Series 6: DWI · axial · 4.0mm · 0.88mm/px · z∈[+0,+138]mm · 4 of 36 slices shown (3 of 6)]
[im 1/36]
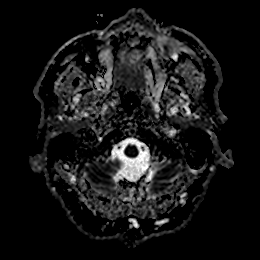
[im 12/36]
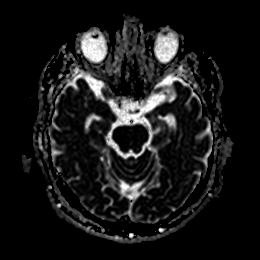
[im 24/36]
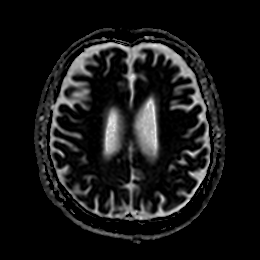
[im 36/36]
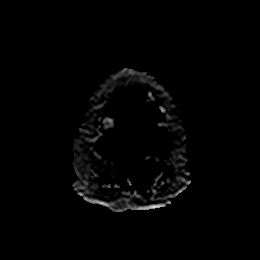

[Series 7: DWI · coronal · 5.0mm · 0.88mm/px · 4 of 28 slices shown (4 of 6)]
[im 1/28]
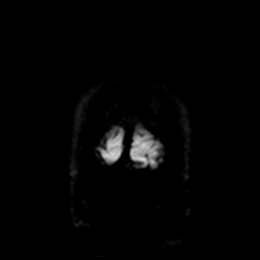
[im 10/28]
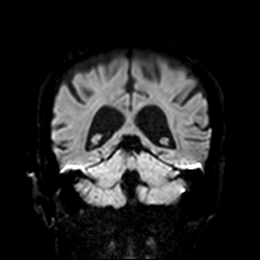
[im 19/28]
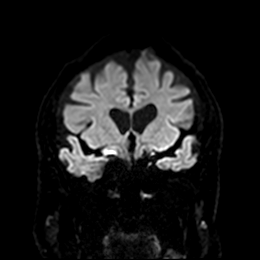
[im 28/28]
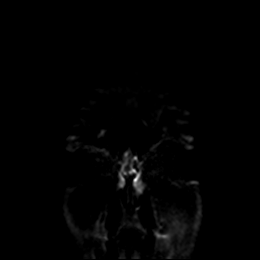

[Series 7: DWI · coronal · 5.0mm · 0.88mm/px · 4 of 28 slices shown (5 of 6)]
[im 1/28]
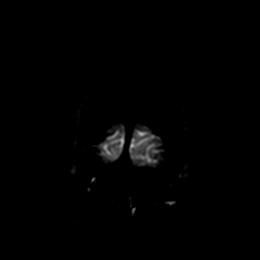
[im 10/28]
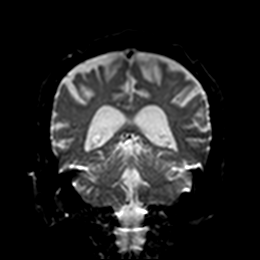
[im 19/28]
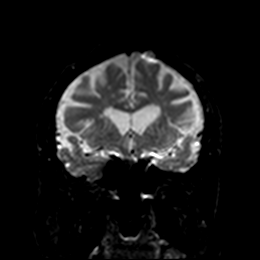
[im 28/28]
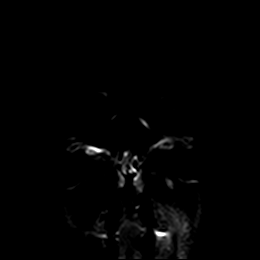

[Series 8: DWI · coronal · 5.0mm · 0.88mm/px · 4 of 28 slices shown (6 of 6)]
[im 1/28]
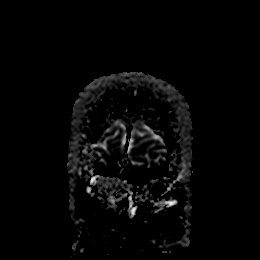
[im 10/28]
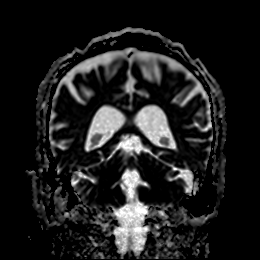
[im 19/28]
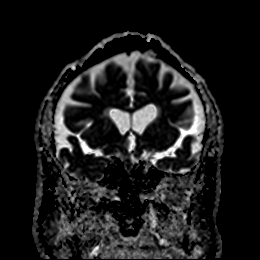
[im 28/28]
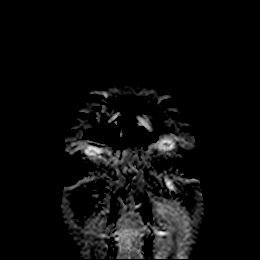

[Series 9: T1 · sagittal · 5.0mm · 0.94mm/px · 3 of 21 slices shown]
[im 1/21]
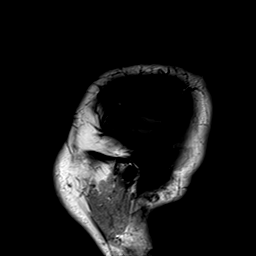
[im 11/21]
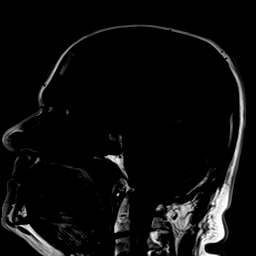
[im 21/21]
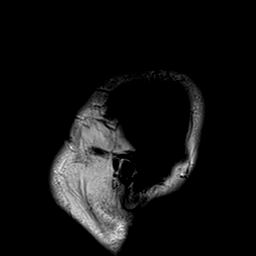

[Series 10: T2 · axial · 5.0mm · 0.72mm/px · z∈[+4,+135]mm · 3 of 20 slices shown (1 of 2)]
[im 1/20]
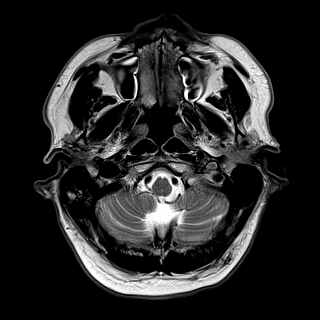
[im 10/20]
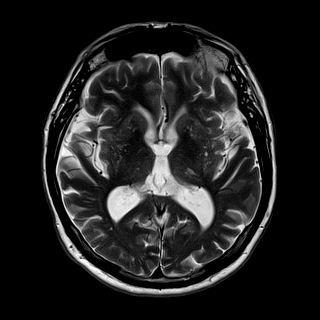
[im 20/20]
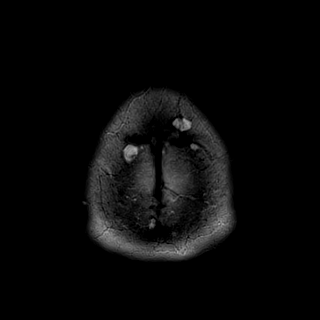

[Series 11: ax hemo · axial · 5.0mm · 0.86mm/px · z∈[-2,+140]mm · 3 of 25 slices shown]
[im 1/25]
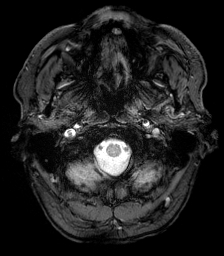
[im 13/25]
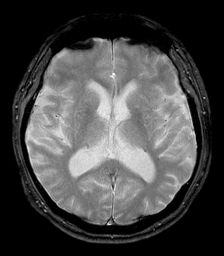
[im 25/25]
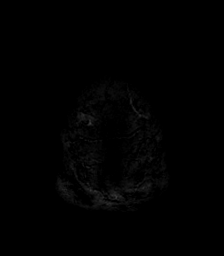

[Series 12: FLAIR · axial · 4.0mm · 0.43mm/px · z∈[+8,+130]mm · 4 of 32 slices shown]
[im 1/32]
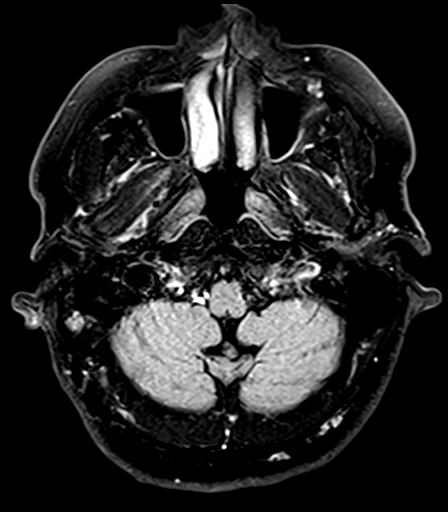
[im 11/32]
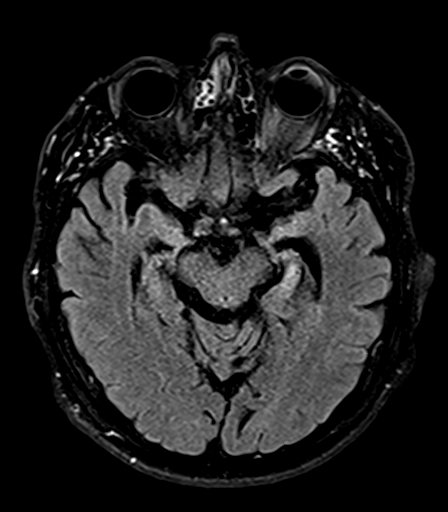
[im 21/32]
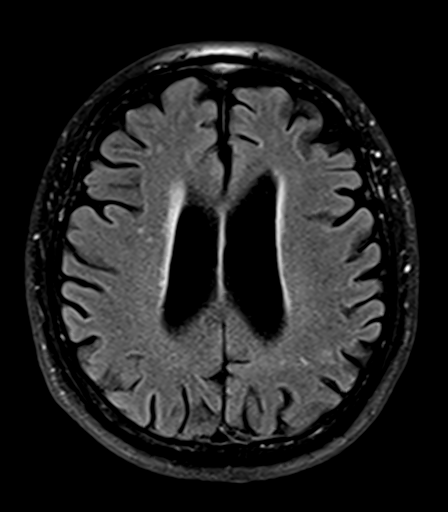
[im 32/32]
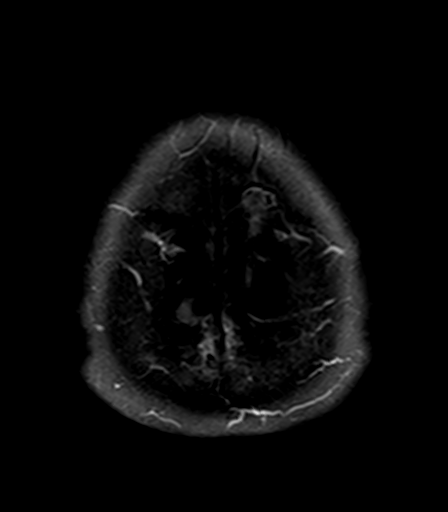

[Series 14: T2 · coronal · 5.0mm · 0.72mm/px · 3 of 26 slices shown (2 of 2)]
[im 1/26]
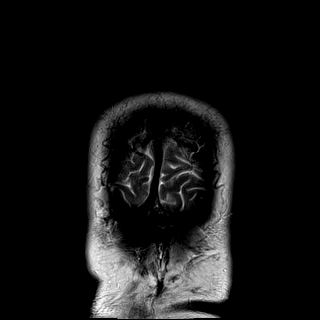
[im 13/26]
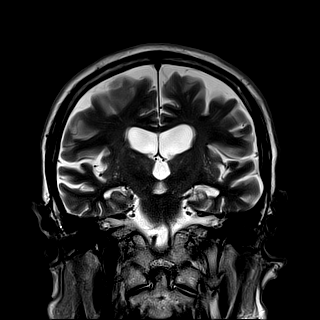
[im 26/26]
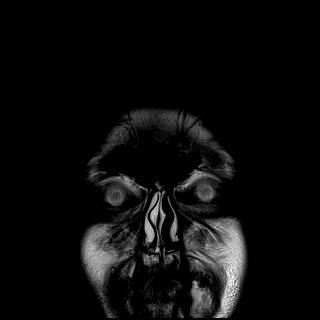

[40 of 48 positions shown; findings below may reference images not displayed]

FINDINGS: Brain: Diffusion imaging does not show any acute or subacute
infarction. No focal abnormality affects the brainstem or
cerebellum. Chronic volume loss and gliosis in the mesial temporal
lobe on the left as seen on the last 2 studies but which was not
present in Thursday May, 2020. Findings could be due to old infarction or
sequela seizure disorder. Certainly the abnormality could predispose
to seizure disorder. Elsewhere, old small vessel infarctions of the
thalami are noted and there are minimal small vessel ischemic
changes of the hemispheric white matter. No mass lesion, hemorrhage,
hydrocephalus or extra-axial collection.

Vascular: Major vessels at the base of the brain show flow.

Skull and upper cervical spine: Negative

Sinuses/Orbits: Clear/normal

Other: None
IMPRESSION: No acute finding.

Stable appearance of volume loss and gliosis in the mesial temporal
lobe on the left over the last 2 studies. This was not present in
Thursday May, 2020 however. This could be sequela of an old infarction or
seizure disorder. The abnormality could predispose to seizure.

Chronic small-vessel ischemic changes of the thalami and hemispheric
white matter.

## 2022-09-05 ENCOUNTER — Encounter (HOSPITAL_COMMUNITY): Payer: Self-pay | Admitting: Gastroenterology

## 2022-09-13 NOTE — Progress Notes (Unsigned)
Provider:  [name], [name]  Primary Care Physician:  [name], MD  Reason for visit: Office follow-up   No chief complaint on file.    HPI:   Update 09/14/2022 JM: Patient returns for 39-monthfollow-up.  He is accompanied by his wife.  Reports cognition *** Remains on Aricept and Lexapro  Denies any new stroke/TIA symptoms. Compliant on aspirin and atorvastatin.  Blood pressure well controlled.   In regards to sleep apnea, review of compliance report shows adequate usage with 29 out of 30 usage days and 23 days greater than 4 hours for 77% compliance.  Optimal residual AHI of 1.9.  Set pressure 9 with EPR level 3.  Leaks in the 95th percentile 15.1.     History provided for reference purposes only Update 03/14/2022 JM: Patient returns for stroke and dementia follow-up after prior visit 09/27/2021.  He is accompanied by his wife.  Vascular dementia:  Hx of stroke:  Per patient Does have issues with short term memory - believes some decline since prior visit Is able to maintain majority ADLs independently but wife does need to pick out clothes as it may take him too long Does have difficulty focusing or may lose train of thought or get distracted easily Reports he is still working and going to job sites. Can get frustrated due to waiting for materials to come in which can take 2-3 weeks and can hold up completion of job (per wife, not currently working) He tries to stay busy, works around his yard and likes to sPress photographerprescribing  Per wife can have good days/bad days. At times, very clear and no difficulty but other days will have great difficulty doing simple tasks or constantly repeating questions Will start certain tasks and will move on to a different task before completing No issues with being distracted or staying focused prior to stroke Will have difficulty finding the correct words or say the incorrect word (at times will realize he  used incorrect word) Can have occasional agitation more so when feeling overwhelmed in crowded areas Typically does well if stays with same daily routine Mentions exaggerated laughing such as when watching a TV show that he has seen before. No inappropriate laughing but laughs excessively at things that may not be that funny to others. Denies uncontrollable laughter or any uncontrollable or inappropriate crying. 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 Thankfully, wife was able to find him before doing any work and he was able to be brought home safely. This has not reoccurred since then.  They now have locations set up on cell phone.   MMSE today 18/30 (prior 20/30)  He has remained on Aricept 5 mg daily and Lexapro 20 mg daily.  Denies side effects.  Did have repeat MRI 3/22 after presenting to ED with left facial swelling which was negative for acute findings.  Compliant on aspirin and atorvastatin, denies side effects.  Blood pressure today 141/76.     OSA on CPAP:  Past 30-day compliance report shows 30 out of 30 usage days with 21 days greater than 4 hours for 70% compliance.  Average usage 4 hours and 29 minutes.  Residual AHI 1.6 on set pressure of 9.  Leaks in the  95 percentile 9.9. does have some issues sleeping at times, can become uncomfortable in bed and will move to recliner without his CPAP. If wife wakes up when he is in the recliner, she will wake him up to go back to bed to use CPAP.  He has no complaints regarding CPAP.    Update 09/27/2021 JM: Returns for 23-monthfollow-up accompanied by his wife who provides majority of history  Vascular dementia: Stable per wife - can have increased difficulty with overstimulation or with increased anxiety He has not worked since May  2022. He has also refrain from driving - wife questions his ability to return to driving as he is requesting to do so MMSE today 20/30 (prior 16/30 05/2021) -previously completed during f/u visit with Dr. DBrett Fairyfor hospital follow-up after acute mental status change on 04/30/2021 possibly in setting of encephalopathy due to autoimmune encephalitis vs less likely seizures (see OV note from 05/19/2021). Repeat MRI showed unchanged abnormality in anterior left temporal lobe possibly remote infarct vs encephalitis vs low-grade glioma (per report).  Repeat EEG 06/09/2021 no evidence of seizure activity and slowly discontinue Keppra. Dr. DBrett Fairyinitiated Aricept 5 mg daily which he has been tolerating without side effects Eval by Dr. MMelvyn Novas8/2022 with likely vascular dementia likely exacerbated by history of COVID-19 and acute metabolic encephalopathy due to suspected encephalitis. Advised he refrain from driving and working. Recommended repeat eval in 12 months  No behavioral concerns   OSA on CPAP: 30-day compliance report shows 30 out of 30 usage days with only 7 days greater than 4 hours with average usage 3 hours and 0 minutes.  Residual AHI 1.3.  Leaks in the 95th percentile 13.9.  Set pressure 9 and EPR level 3. Continues to tolerate well but will wake up in the middle of the night to either go to the bathroom or to sit in recliner and upon returning back to bed, he will forget to put back on. Up to date on supplies provided through [facility].  Previously discussed inspire device - not a good candidate due to needing anesthesia (per Dr. DBrett Fairy.    History of stroke: Stable in regards to stroke aspect.  Denies new stroke/TIA symptoms.  Compliant on aspirin and atorvastatin -denies side effects.  Blood pressure today 136/74.     Other  S/p left total knee arthroplasty 07/19/2021 - working with PT - has 2 more sessions. Wife mentioned physical therapist recommended cognitive therapy initially but  unsure if they still recommend this.  She plans on further discussing at next visit.    No further concerns at this time   Update 03/29/2021 JM:   Mr. HSappenfieldreturns for 658-monthtroke and sleep apnea follow-up accompanied by his wife.  Wife sent MyChart message prior to todays visit with concerns regarding continued decline in his cognition especially with short-term memory and processing of information.  Remains on Lexapro for prior increased agitation and irritation.  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  This concerns his wife as she does not feel he should be still working in setting of his continued cognitive decline.  MMSE today 24/30 (prior 22/30).  Of note, he was diagnosed with COVID-19 12/03/2020 and prior COVID 19 06/2020. He did recover well from most recent case and does not believe any worsening of cognition denies residual deficits.  Compliant on aspirin and atorvastatin without  associated side effects.  Blood pressure today 111/70.   CPAP compliance report from the past 30 days shows 30 out of 30 usage days but only 15 days greater than 4 hours with average usage 4 hours and 2 minutes.  Residual AHI 0.9.  Per wife, will sleep for a couple hours prior to going to bed in recliner then will sleep in bed for 2-4 hours then go back to his recliner and sleep additional time.  He questions possible use of inspire device  Plans on undergoing knee replacement - sees ortho Thursday to discuss further   No further concerns at this time   Update 09/28/2020 JM: Mr. Luber returns for 81-monthfollow-up regarding history of stroke and cognitive decline.  Since prior visit, he was diagnosed with COVID 8/30 and wife has noticed slight worsening of cognition since that time but overall stable. MMSE today 22/30 (prior 22/30). MRI, EEG and lab work all unremarkable for reversible causes or abnormality. 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 Wife has been assisting with this. Does report occasional increased agitation or irritation but has been able to better manage this. Currently on lexapro 232mdaily. Denies new or recurrent stroke/TIA symptoms remaining on aspirin 325 mg daily and atorvastatin 40 mg daily without side effects.  Blood pressure today 110/68. History of OSA on CPAP with difficulty recently due to possibly walking pneumonia recently completing treatment. Review of compliance report from 08/29/2020 -09/27/2020 shows 29 out of 30 usage days for 97% compliance with only 19 days greater than 4 hours for 63% compliance. Average usage 4 hours and 18 minutes. Residual AHI 2.0 on set pressure 9 cm H2O and EPR level 3. He does report slowly increasing his tolerance as he had difficulty using towards the end of October and beginning of November. Continues to follow with DME company [employer] for any needed supplies. No further concerns.  Update 05/18/2020 JM: Mr. HiRipbergereturns for follow-up accompanied by his wife. Prior concerns of short-term memory loss gradually worsening since his stroke in 05/2018.  Evidence of depression/anxiety and initiated Lexapro 10 mg daily with improvement.  He does continue to experience occasional agitation/irritability as well as occasional short-term memory deficit as well as difficulty in multitasking and focusing.  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  He also reports intermittent word finding difficulty which has been more noticeable per wife.  He does report increased fatigue taking naps after work as well as difficulty with insomnia.  Ongoing compliance with CPAP for OSA management -download report told to ensure optimal residual AHI which  showed excellent compliance with residual AHI 0.9.  Continues on aspirin 325 mg daily and atorvastatin 40 mg daily for secondary stroke prevention.  Blood pressure today satisfactory 125/77.  No further concerns at this time.  Update 01/14/2020 JM: Mr. HiElmans a [age]ear old male presenting today, 01/14/2020 for a follow up with his wife present. He was last seen on 09/26/2019 for worsening short-term memory loss, which he and his wife feel has improved. He was started on lexapro 1060mue to concerns of underlying depression/anxiety affecting memory with improvement of underlying depression/anxiety.  Most recent MMSE 26/30 on 12/25/2019 with prior 23/30.  He was previously being followed in this office due to left thalamic stroke  in 04/2018 with residual right hemisensory impairment. He has continued on aspirin and atorvastatin for secondary stroke prevention. Overall patient and wife feel like his depression, anxiety and memory impairment have improved.  Recently had follow-up with Judson Roch, NP for sleep apnea and endorses ongoing compliance.  Continues on aspirin and atorvastatin for secondary stroke prevention. Blood pressure today satisfactory 125/79. No further concerns at this time.   Update 09/26/2019: Mr. Beevers is a 66 year old male who is being seen per patient/wife request due to worsening short-term memory loss.  He was previously being followed in this office due to left thalamic stroke in 04/2018 with residual right hemisensory impairment.  He has continued on aspirin and atorvastatin for secondary stroke prevention.  Recent lipid panel showed LDL 61.  Wife has been observing occasional short-term memory concerns over the past few months.  He has continued working and functioning without difficulty but he will occasionally forget certain conversations, triple check to ensure he is completed different tasks and forgetting certain dates.  He does endorse increased stress over the past few months at work where he  has no disease last patient with his workers and increased irritability.  He is also been experiencing worsening insomnia and has been having greater difficulty tolerating CPAP mask.  He does have a prior history of anxiety.  He was previously treated on Xanax several years prior. MMSE 23/30 greatest in recall, repetition, and drawing.  GAD-7 score 4.  PHQ 9 score 5.  After further discussing possible depression/anxiety causing concerns of memory, wife has noticed patient being more depressed recently.  Reviewing his medication list, he will take Benadryl occasionally as needed to help with sleep.  Interval history 08/23/2018: Patient returns today for stroke follow-up visit.  He did undergo 30-day cardiac monitor which did show episode of V. tach with heart rate 178 but negative for atrial fibrillation.  Upon initial evaluation in the ED, he reported numbness of the right face, right upper extremity and right lower extremity.  Patient does state that he continues to have right upper extremity numbness that is constant with a "stiff and tight feeling".  He has continued to work despite his continued numbness but does have intermittent difficulties as he is right-hand dominant.  He also does have some memory concerns stating that he is not as "sharp" as he previously was and will ask the same questions or will forget certain directions.  He continues to take aspirin without side effects of bleeding or bruising.  Continues to take Lipitor without side effects myalgias.  Blood pressure today satisfactory 135/89.  He does continue to state compliance with CPAP but has been finding himself taking the mask off in the middle the night.  He does have a follow-up in this office for OSA management on 09/13/2018.  No further concerns at this time.  Denies new or worsening stroke/TIA symptoms.  05/24/2018 visit CD: Seen in STROKE follow up -Mr. Jermain Curt is a 66 year old male patient whom I had initially seen earlier this  year for evaluation of sleep apnea unfortunately the patient underwent a right-sided inguinal hernia repair for which she had to discontinue his full size aspirin 5 days prior.  Also aspirin was removed within 2 or 3 days post surgery he did suffer a stroke.  And his main symptom was right-sided numbness and clumsiness sensory loss of the right dominant hand.  I see him today after he has undergone a stroke work-up in the hospital which included a head CT  without contrast on 7 June, an MRI MRA of head and neck, the findings were an acute lacunar infarct in the left lateral thalamus there was no hemorrhage, he had negative abnormalities he was negative for abnormalities in the vascular tree his neck MRA showed mild atherosclerosis at the origins of the left internal carotid artery and right subclavian artery but not significant stenosis and he had very mild for age nonspecific white matter signal changes these are commonly seen the small vessel disease there was no brain atrophy noted.  He had presented to the hospital ED with elevated blood pressures which may explain the lacunar appearance of a previous stroke.  Head CT was negative.  Onset of symptoms was on 13 April 2018 he was evaluated by Dr. Kerney Elbe neurologist on-call, who also mentioned that the patient has a history of obstructive sleep apnea and is currently treated at 9 cmH2O pressure.  The patient restarted on aspirin metabolic panels were normal his creatinine was 1.05 his BUN 24 which may indicate that he was slightly dehydrated, his CBC was normal but that the count was 9.6K hemoglobin 15.1 hematocrit 50.1 without any evidence of anemia normal platelet count 350 3K.  Cardiac enzymes were negative his total cholesterol was actually rather low at 133 ng but is good "" cholesterol HDL was only 30.      Review of Systems: Out of a complete 14 system review, the patient complains of only the following listed in HPI, and all other reviewed systems  are negative.    Social History   Socioeconomic History   Marital status: Married    Spouse name: [name]   Number of children: Not on file   Years of education: 12   Highest education level: High school graduate  Occupational History   Occupation: Unemployed/retired    Comment: Clinical biochemist  Tobacco Use   Smoking status: Never    Passive exposure: Past   Smokeless tobacco: Never  Vaping Use   Vaping Use: Never used  Substance and Sexual Activity   Alcohol use: No   Drug use: No   Sexual activity: Yes    Birth control/protection: None  Other Topics Concern   Not on file  Social History Narrative   Lives with wife   Social Determinants of Health   Financial Resource Strain: Low Risk  (08/25/2022)   Overall Financial Resource Strain (CARDIA)    Difficulty of Paying Living Expenses: Not hard at all  Food Insecurity: No Food Insecurity (08/25/2022)   Hunger Vital Sign    Worried About Running Out of Food in the Last Year: Never true    Ran Out of Food in the Last Year: Never true  Transportation Needs: No Transportation Needs (08/25/2022)   PRAPARE - Hydrologist (Medical): No    Lack of Transportation (Non-Medical): No  Physical Activity: Sufficiently Active (08/25/2022)   Exercise Vital Sign    Days of Exercise per Week: 7 days    Minutes of Exercise per Session: 30 min  Stress: No Stress Concern Present (08/25/2022)   [place]    Feeling of Stress : Only a little  Social Connections: Moderately Integrated (08/25/2022)   Social Connection and Isolation Panel [NHANES]    Frequency of Communication with Friends and Family: Once a week    Frequency of Social Gatherings with Friends and Family: Once a week    Attends Religious Services: 1 to 4 times  per year    Active Member of Clubs or Organizations: Yes    Attends Club or Organization Meetings: 1 to 4 times per year     Marital Status: Married  Human resources officer Violence: Not At Risk (08/25/2022)   Humiliation, Afraid, Rape, and Kick questionnaire    Fear of Current or Ex-Partner: No    Emotionally Abused: No    Physically Abused: No    Sexually Abused: No    Family History  Problem Relation Age of Onset   Tuberculosis Mother    Stroke Mother    Arthritis Mother    Cancer Mother    Heart disease Father        died at 33   Early death Father    Bradycardia Brother        pacemaker   Stroke Brother    Hypertension Brother    Mental illness Paternal Grandfather        suicide at 47   Suicidality Paternal Grandfather    Cancer Paternal Uncle    Dementia Maternal Uncle 93   Colon cancer Neg Hx     Past Medical History:  Diagnosis Date   Acid reflux 77/82/4235   Acute metabolic encephalopathy 36/14/4315   Anxiety    Arthritis    Cerebral embolism with cerebral infarction 04/15/2018   Chest pain 05/19/2015   Depression    EEG abnormality without seizure 05/19/2021   Encephalitis 04/30/2021   Encephalopathy due to COVID-19 virus    Episodic confusion 05/19/2021   Heart murmur    Hyperglycemia 05/19/2015   Hypertension    Left thalamic lacunar infarct 05/24/2018   OSA (obstructive sleep apnea)    cpap 9 cm H2O   Recurrent unilateral inguinal hernia with obstruction and without gangrene    SBO (small bowel obstruction)    Sleep apnea 3 years ago   Ulcerative colitis 09/24/2019   Vascular dementia without behavioral disturbance 05/19/2021    Past Surgical History:  Procedure Laterality Date   BIOPSY  06/21/2017   Procedure: BIOPSY;  Surgeon: Rogene Houston, MD;  Location: [facility];  Service: Endoscopy;;  rectal, righta and left colon;   COLONOSCOPY N/A 06/21/2017   Procedure: COLONOSCOPY;  Surgeon: Rogene Houston, MD;  Location: [facility];  Service: Endoscopy;  Laterality: N/A;  1:25   COLONOSCOPY WITH PROPOFOL N/A 08/31/2022   Procedure: COLONOSCOPY WITH PROPOFOL;   Surgeon: Harvel Quale, MD;  Location: [facility];  Service: Gastroenterology;  Laterality: N/A;  1245 ASA 2   HERNIA REPAIR  3 years ago   INGUINAL HERNIA REPAIR Right 04/04/2018   Procedure: HERNIA REPAIR INGUINAL ADULT WITH MESH;  Surgeon: Aviva Signs, MD;  Location: [facility];  Service: General;  Laterality: Right;   INGUINAL HERNIA REPAIR Right 01/31/2019   Procedure: RECURRENT RIGHT INGUINAL HERNIORRHAPY WITH MESH, INCARCERATED;  Surgeon: Aviva Signs, MD;  Location: [facility];  Service: General;  Laterality: Right;   JOINT REPLACEMENT  July 19, 2021   Total knee repl.   KNEE SURGERY     tore mcl in high school (70's)   TOTAL KNEE ARTHROPLASTY Left 07/19/2021   Procedure: TOTAL KNEE ARTHROPLASTY;  Surgeon: Gaynelle Arabian, MD;  Location: [facility];  Service: Orthopedics;  Laterality: Left;    Current Outpatient Medications  Medication Sig Dispense Refill   amoxicillin-clavulanate (AUGMENTIN) 875-125 MG tablet Take 1 tablet by mouth 2 (two) times daily. (Patient not taking: Reported on 08/29/2022) 20 tablet 0   Ascorbic Acid (  VITAMIN C) 500 MG CAPS Take 500 mg by mouth daily.     aspirin EC 81 MG tablet Take 81 mg by mouth daily. Swallow whole.     atorvastatin (LIPITOR) 40 MG tablet TAKE ONE TABLET BY MOUTH AT 6PM 90 tablet 1   [facility] B-12 500 MCG tablet Take 500 mcg by mouth daily.     diphenhydrAMINE (BENADRYL) 25 MG tablet Take 25 mg by mouth daily as needed for allergies.     donepezil (ARICEPT) 5 MG tablet Take 1 tablet (5 mg total) by mouth at bedtime. 90 tablet 3   escitalopram (LEXAPRO) 20 MG tablet Take 1 tablet (20 mg total) by mouth at bedtime. (Patient not taking: Reported on 08/29/2022) 90 tablet 3   folic acid (FOLVITE) 1 MG tablet Take 1 mg by mouth daily.     losartan (COZAAR) 100 MG tablet TAKE ONE (1) TABLET BY MOUTH EVERY DAY 90 tablet 2   pantoprazole (PROTONIX) 40 MG tablet Take 1 tablet (40 mg total) by mouth daily. NEEDS OFFICE VISIT (Patient taking  differently: Take 40 mg by mouth every other day. NEEDS OFFICE VISIT) 90 tablet 3   sulfaSALAzine (AZULFIDINE) 500 MG tablet Take 2 tablets (1,000 mg total) by mouth 2 (two) times daily. 360 tablet 3   No current facility-administered medications for this visit.    Allergies as of 09/14/2022   (No Known Allergies)    Vitals: There were no vitals filed for this visit.  There is no height or weight on file to calculate BMI.   Physical exam: General: well developed, well nourished, very pleasant middle-age Caucasian male, seated, in no evident distress Head: head normocephalic and atraumatic.   Neck: supple with no carotid or supraclavicular bruits Cardiovascular: regular rate and rhythm, no murmurs Skin:  no rash/petichiae Vascular:  Normal pulses all extremities   Neurologic Exam Mental Status: Awake and fully alert.   did have some speech hesitancy when speaking too quickly but unable to appreciate aphasia or dysarthria.   Recent memory impaired and remote memory intact. Attention span, concentration and fund of knowledge impaired. Mood and affect appropriate.      03/14/2022    2:22 PM 09/27/2021    9:05 AM 05/19/2021    3:27 PM  MMSE - Mini Mental State Exam  Orientation to time 3 3 2   Orientation to Place 4 4 4   Registration 3 3 3   Attention/ Calculation 0 1 1  Recall 0 0 0  Language- name 2 objects 2 2 2   Language- repeat 1 1 1   Language- follow 3 step command 3 3 2   Language- read & follow direction 1 1 0  Write a sentence 1 1 1   Copy design 0 1 0  Total score 18 20 16    Cranial Nerves:  Pupils equal, briskly reactive to light. Extraocular movements full without nystagmus. Visual fields full to confrontation. Hearing intact. Facial sensation intact. Face, tongue, palate moves normally and symmetrically.  Motor: Normal bulk and tone. Normal strength in all tested extremity muscles. Sensory.: intact to touch , pinprick , position and vibratory sensation.  Coordination:  Rapid alternating movements normal in all extremities. Finger-to-nose and heel-to-shin performed accurately bilaterally. Gait and Station: Arises from chair without difficulty. Stance is normal. Gait demonstrates normal stride length and balance without use of assistive device Reflexes: 1+ and symmetric. Toes downgoing.       Assessment/plan: Justin [name] is a 66 year old male with PMHx of left thalamic stroke 04/2018 likely secondary  to small vessel disease, vascular dementia, HTN, HLD, OSA, COVID 19 x2, and metabolic encephalopathy likely in setting of encephalitis 04/2021    1.  Vascular dementia -MMSE 18/30 (prior 20/30)  -Neurocognitive eval 07/06/2021 - likely vascular dementia with prior stroke hx likely exacerbated by history of COVID-19 and acute metabolic encephalopathy due to suspected encephalitis -plans on f/u with neuropsych 06/2022 -likely some progression of dementia based on symptoms - low suspicion of stroke as exam stable and repeat MRI brain 01/2022 no new findings.  -Question mild form of pseudobulbar affect vs heightened emotions with increased laughter. Not debilitating or concerning to family/patient therefore will hold off on medication management - could trial Nuedexta if indicated in the future -Continue Aricept 5 mg daily - refill up-to-date -Continue Lexapro 20 mg daily - refill up-to-date -Discussed importance of memory exercises, managing stroke risk factors, healthy diet, routine exercise and adequate sleep.  -provided information re: driving evaluations. No driving unless evaluations completed and cleared   2. History of stroke -Continue aspirin 81 mg daily and atorvastatin 40 mg daily for secondary stroke prevention -Discussed secondary stroke prevention measures and importance of close follow-up with PCP for HTN and HLD management    3.  Obstructive sleep apnea -Recent compliance report shows improvement of compliance and optimal residual AHI -Discussed  importance of continued nightly usage and ensuring greater than 4 hours per night per insurance requirements and for optimal benefit -Per Dr. Brett Fairy, not a candidate for hypoglossal nerve stimulator due to this requiring anesthesia    Follow-up in 6 months or call earlier if needed    CC:  [name], MD     I spent 53 minutes of face-to-face and non-face-to-face time with patient and wife.  This included previsit chart review, lab review, study review, order entry, electronic health record documentation, patient and wife education and discussion regarding vascular dementia and review and discussion of MMSE and further dementia related concerns, history of prior stroke and secondary stroke prevention measures and aggressive stroke risk factor management, OSA and review and discussion regarding compliance report, and answered all the questions to patient and wife satisfaction   Frann Rider, AGNP-BC  [facility] [address] [place], [place] [address]  Phone [phone] Fax [phone] Note: This document was prepared with digital dictation and possible smart phrase technology. Any transcriptional errors that result from this process are unintentional.

## 2022-09-14 ENCOUNTER — Other Ambulatory Visit: Payer: Self-pay | Admitting: Adult Health

## 2022-09-14 ENCOUNTER — Encounter: Payer: Self-pay | Admitting: Adult Health

## 2022-09-14 ENCOUNTER — Ambulatory Visit: Payer: Medicare PPO | Admitting: Adult Health

## 2022-09-14 VITALS — BP 140/88 | HR 62 | Ht 73.0 in | Wt 194.0 lb

## 2022-09-14 DIAGNOSIS — I499 Cardiac arrhythmia, unspecified: Secondary | ICD-10-CM

## 2022-09-14 DIAGNOSIS — G4733 Obstructive sleep apnea (adult) (pediatric): Secondary | ICD-10-CM | POA: Diagnosis not present

## 2022-09-14 DIAGNOSIS — Z8673 Personal history of transient ischemic attack (TIA), and cerebral infarction without residual deficits: Secondary | ICD-10-CM

## 2022-09-14 DIAGNOSIS — I493 Ventricular premature depolarization: Secondary | ICD-10-CM

## 2022-09-14 DIAGNOSIS — F0150 Vascular dementia without behavioral disturbance: Secondary | ICD-10-CM | POA: Diagnosis not present

## 2022-09-14 MED ORDER — DONEPEZIL HCL 5 MG PO TABS: 5.0000 mg | ORAL_TABLET | Freq: Every day | ORAL | 3 refills | Status: DC

## 2022-09-14 NOTE — Progress Notes (Signed)
CPAP Orders have been sent to DME on file [facility]. New orders have been placed for the above pt, DOB: [birth year] Thanks

## 2022-09-14 NOTE — Patient Instructions (Addendum)
Recommend completion of cardiac monitor for irregular heart rhythm today - you will be called to get this set up  Continue Aricept for cognition  Continue aspirin 81 mg daily  and atorvastatin  for secondary stroke prevention  Continue nighty use of CPAP for good sleep apnea management  Continue to follow up with PCP regarding cholesterol and blood pressure management  Maintain strict control of hypertension with blood pressure goal below 130/90, diabetes with hemoglobin A1c goal below 7.0 % and cholesterol with LDL cholesterol (bad cholesterol) goal below 70 mg/dL.   Signs of a Stroke? Follow the BEFAST method:  Balance Watch for a sudden loss of balance, trouble with coordination or vertigo Eyes Is there a sudden loss of vision in one or both eyes? Or double vision?  Face: Ask the person to smile. Does one side of the face droop or is it numb?  Arms: Ask the person to raise both arms. Does one arm drift downward? Is there weakness or numbness of a leg? Speech: Ask the person to repeat a simple phrase. Does the speech sound slurred/strange? Is the person confused ? Time: If you observe any of these signs, call 911.     Followup in the future with me in 5 months or call earlier if needed       Thank you for coming to see [place] at [facility]. I hope we have been able to provide you high quality care today.  You may receive a patient satisfaction survey over the next few weeks. We would appreciate your feedback and comments so that we may continue to improve ourselves and the health of our patients.

## 2022-09-20 ENCOUNTER — Encounter (INDEPENDENT_AMBULATORY_CARE_PROVIDER_SITE_OTHER): Payer: Self-pay | Admitting: Gastroenterology

## 2022-09-21 ENCOUNTER — Ambulatory Visit: Payer: Medicare PPO | Attending: Adult Health

## 2022-09-21 DIAGNOSIS — I499 Cardiac arrhythmia, unspecified: Secondary | ICD-10-CM | POA: Diagnosis not present

## 2022-09-21 DIAGNOSIS — I493 Ventricular premature depolarization: Secondary | ICD-10-CM

## 2022-09-21 DIAGNOSIS — Z8673 Personal history of transient ischemic attack (TIA), and cerebral infarction without residual deficits: Secondary | ICD-10-CM | POA: Diagnosis not present

## 2022-10-03 ENCOUNTER — Telehealth: Payer: Self-pay | Admitting: *Deleted

## 2022-10-03 NOTE — Telephone Encounter (Signed)
Received faxed monitor report from [employer].  Episode was from 09/30/22 at 1:35 am.  Reported as SB/SR with run of VT(9beats)/1st degree AV block.  Reviewed with Dr. Johnsie Cancel who feels probably artifact and SR with first degree block.  Recommends to continue monitoring. Will forward to Frann Rider, NP ordering provider

## 2022-10-05 NOTE — Telephone Encounter (Signed)
Noted! Thank you

## 2022-10-07 ENCOUNTER — Encounter: Payer: Self-pay | Admitting: Adult Health

## 2022-10-17 ENCOUNTER — Encounter: Payer: Self-pay | Admitting: Adult Health

## 2022-12-15 ENCOUNTER — Other Ambulatory Visit: Payer: Self-pay | Admitting: Family Medicine

## 2023-01-09 ENCOUNTER — Ambulatory Visit: Payer: Medicare PPO | Admitting: Family Medicine

## 2023-01-09 ENCOUNTER — Encounter: Payer: Self-pay | Admitting: Family Medicine

## 2023-01-09 VITALS — BP 126/82 | HR 88 | Ht 73.0 in | Wt 184.0 lb

## 2023-01-09 DIAGNOSIS — I6381 Other cerebral infarction due to occlusion or stenosis of small artery: Secondary | ICD-10-CM

## 2023-01-09 DIAGNOSIS — R413 Other amnesia: Secondary | ICD-10-CM

## 2023-01-09 DIAGNOSIS — I10 Essential (primary) hypertension: Secondary | ICD-10-CM

## 2023-01-09 NOTE — Progress Notes (Signed)
Subjective:    Patient ID: Justin [name], male    DOB: Jul 04, 1956, 67 y.o.   MRN: [id number]  HPI Wt Readings from Last 3 Encounters:  01/09/23 184 lb (83.5 kg)  09/14/22 194 lb (88 kg)  08/31/22 190 lb (86.2 kg)   Patient is here today with his wife.  She denies any progression in his memory loss.  She thinks that his appetite may have declined slightly and is not eating enough.  However she denies any delirium or confusion or hallucinations.  She denies any seizure-like activity.  He denies any chest pain or shortness of breath or dyspnea on exertion.  His blood pressure today is well-controlled.  He denies any bleeding or bruising on the aspirin.  He is no longer working and he is retired.  He denies any depression. Past Medical History:  Diagnosis Date   Acid reflux A999333   Acute metabolic encephalopathy [id number]   Anxiety    Arthritis    Cerebral embolism with cerebral infarction 04/15/2018   Chest pain 05/19/2015   Depression    EEG abnormality without seizure 05/19/2021   Encephalitis 04/30/2021   Encephalopathy due to COVID-19 virus    Episodic confusion 05/19/2021   Heart murmur    Hyperglycemia 05/19/2015   Hypertension    Left thalamic lacunar infarct 05/24/2018   OSA (obstructive sleep apnea)    cpap 9 cm H2O   Recurrent unilateral inguinal hernia with obstruction and without gangrene    SBO (small bowel obstruction)    Sleep apnea 3 years ago   Ulcerative colitis 09/24/2019   Vascular dementia without behavioral disturbance 05/19/2021   Past Surgical History:  Procedure Laterality Date   BIOPSY  06/21/2017   Procedure: BIOPSY;  Surgeon: Rogene Houston, MD;  [facility];  Service: Endoscopy;;  rectal, righta and left colon;   COLONOSCOPY N/A 06/21/2017   Procedure: COLONOSCOPY;  Surgeon: Rogene Houston, MD;  [facility];  Service: Endoscopy;  Laterality: N/A;  1:25   COLONOSCOPY WITH PROPOFOL N/A 08/31/2022   Procedure:  COLONOSCOPY WITH PROPOFOL;  Surgeon: Harvel Quale, MD;  [facility];  Service: Gastroenterology;  Laterality: N/A;  1245 ASA 2   HERNIA REPAIR  3 years ago   INGUINAL HERNIA REPAIR Right 04/04/2018   Procedure: HERNIA REPAIR INGUINAL ADULT WITH MESH;  Surgeon: Aviva Signs, MD;  Location: [facility];  Service: General;  Laterality: Right;   INGUINAL HERNIA REPAIR Right 01/31/2019   Procedure: RECURRENT RIGHT INGUINAL HERNIORRHAPY WITH MESH, INCARCERATED;  Surgeon: Aviva Signs, MD;  Location: [facility];  Service: General;  Laterality: Right;   JOINT REPLACEMENT  July 19, 2021   Total knee repl.   KNEE SURGERY     tore mcl in high school (70's)   TOTAL KNEE ARTHROPLASTY Left 07/19/2021   Procedure: TOTAL KNEE ARTHROPLASTY;  Surgeon: Gaynelle Arabian, MD;  [facility]: [facility];  Service: Orthopedics;  Laterality: Left;   Current Outpatient Medications on File Prior to Visit  Medication Sig Dispense Refill   Ascorbic Acid (VITAMIN C) 500 MG CAPS Take 500 mg by mouth daily.     aspirin EC 81 MG tablet Take 81 mg by mouth daily. Swallow whole.     atorvastatin (LIPITOR) 40 MG tablet TAKE ONE TABLET BY MOUTH AT 6PM 30 tablet 0   [employer] B-12 500 MCG tablet Take 500 mcg by mouth daily.     diphenhydrAMINE (BENADRYL) 25 MG tablet Take 25 mg by  mouth daily as needed for allergies.     donepezil (ARICEPT) 5 MG tablet Take 1 tablet (5 mg total) by mouth at bedtime. 90 tablet 3   folic acid (FOLVITE) 1 MG tablet Take 1 mg by mouth daily.     losartan (COZAAR) 100 MG tablet TAKE ONE (1) TABLET BY MOUTH EVERY DAY 90 tablet 2   pantoprazole (PROTONIX) 40 MG tablet Take 1 tablet (40 mg total) by mouth daily. NEEDS OFFICE VISIT (Patient taking differently: Take 40 mg by mouth every other day. NEEDS OFFICE VISIT) 90 tablet 3   sulfaSALAzine (AZULFIDINE) 500 MG tablet Take 2 tablets (1,000 mg total) by mouth 2 (two) times daily. 360 tablet 3   No current facility-administered medications on  file prior to visit.   No Known Allergies Social History   Socioeconomic History   Marital status: Married    Spouse name: [name]   Number of children: Not on file   Years of education: 12   Highest education level: High school graduate  Occupational History   Occupation: Unemployed/retired    Comment: Clinical biochemist  Tobacco Use   Smoking status: Never    Passive exposure: Past   Smokeless tobacco: Never  Vaping Use   Vaping Use: Never used  Substance and Sexual Activity   Alcohol use: No   Drug use: No   Sexual activity: Yes    Birth control/protection: None  Other Topics Concern   Not on file  Social History Narrative   Lives with wife   Social Determinants of Health   Financial Resource Strain: Low Risk  (08/25/2022)   Overall Financial Resource Strain (CARDIA)    Difficulty of Paying Living Expenses: Not hard at all  Food Insecurity: No Food Insecurity (08/25/2022)   Hunger Vital Sign    Worried About Running Out of Food in the Last Year: Never true    Ran Out of Food in the Last Year: Never true  Transportation Needs: No Transportation Needs (08/25/2022)   PRAPARE - Hydrologist (Medical): No    Lack of Transportation (Non-Medical): No  Physical Activity: Sufficiently Active (08/25/2022)   Exercise Vital Sign    Days of Exercise per Week: 7 days    Minutes of Exercise per Session: 30 min  Stress: No Stress Concern Present (08/25/2022)   [place]    Feeling of Stress : Only a little  Social Connections: Moderately Integrated (08/25/2022)   Social Connection and Isolation Panel [NHANES]    Frequency of Communication with Friends and Family: Once a week    Frequency of Social Gatherings with Friends and Family: Once a week    Attends Religious Services: 1 to 4 times per year    Active Member of [employer] or Organizations: Yes    Attends Archivist Meetings: 1 to  4 times per year    Marital Status: Married  Human resources officer Violence: Not At Risk (08/25/2022)   Humiliation, Afraid, Rape, and Kick questionnaire    Fear of Current or Ex-Partner: No    Emotionally Abused: No    Physically Abused: No    Sexually Abused: No    Review of Systems     Objective:   Physical Exam Vitals reviewed.  Constitutional:      Appearance: Normal appearance. He is normal weight.  Cardiovascular:     Rate and Rhythm: Normal rate and regular rhythm.     Heart sounds: Normal  heart sounds. No murmur heard.    No friction rub. No gallop.  Pulmonary:     Effort: Pulmonary effort is normal. No respiratory distress.     Breath sounds: Normal breath sounds. No stridor.  Abdominal:     General: Bowel sounds are normal. There is no distension.     Palpations: Abdomen is soft.     Tenderness: There is no abdominal tenderness. There is no guarding.  Musculoskeletal:     Right lower leg: No edema.     Left lower leg: No edema.  Skin:    Findings: No erythema or rash.  Neurological:     General: No focal deficit present.     Mental Status: He is alert and oriented to person, place, and time.     Cranial Nerves: No cranial nerve deficit.     Motor: No weakness.     Gait: Gait normal.         Assessment & Plan:  Benign essential HTN  Cerebrovascular accident (CVA) due to occlusion of small artery ([place]) - Plan: CBC with Differential/Platelet, Lipid panel, COMPLETE METABOLIC PANEL WITH GFR  Memory loss - Plan: CBC with Differential/Platelet, Lipid panel, COMPLETE METABOLIC PANEL WITH GFR We discussed increasing Aricept however the patient has diarrhea due to inflammatory bowel disease and therefore they elected not to increase the Aricept.  I am concerned about adding Namenda due to possible appetite suppression therefore we have elected just to make no changes at this time.  His blood pressure is well-controlled.  I will check a lipid panel.  Given his history of  stroke his goal LDL cholesterol is less than 70.

## 2023-01-10 LAB — CBC WITH DIFFERENTIAL/PLATELET
Absolute Monocytes: 687 cells/uL (ref 200–950)
Basophils Absolute: 32 cells/uL (ref 0–200)
Basophils Relative: 0.5 %
Eosinophils Absolute: 88 cells/uL (ref 15–500)
Eosinophils Relative: 1.4 %
HCT: 43.5 % (ref 38.5–50.0)
Hemoglobin: 15 g/dL (ref 13.2–17.1)
Lymphs Abs: 1229 cells/uL (ref 850–3900)
MCH: 32.3 pg (ref 27.0–33.0)
MCHC: 34.5 g/dL (ref 32.0–36.0)
MCV: 93.8 fL (ref 80.0–100.0)
MPV: 10.2 fL (ref 7.5–12.5)
Monocytes Relative: 10.9 %
Neutro Abs: 4265 cells/uL (ref 1500–7800)
Neutrophils Relative %: 67.7 %
Platelets: 295 10*3/uL (ref 140–400)
RBC: 4.64 10*6/uL (ref 4.20–5.80)
RDW: 12.7 % (ref 11.0–15.0)
Total Lymphocyte: 19.5 %
WBC: 6.3 10*3/uL (ref 3.8–10.8)

## 2023-01-10 LAB — COMPLETE METABOLIC PANEL WITH GFR
AG Ratio: 1.9 (calc) (ref 1.0–2.5)
ALT: 16 U/L (ref 9–46)
AST: 21 U/L (ref 10–35)
Albumin: 4.4 g/dL (ref 3.6–5.1)
Alkaline phosphatase (APISO): 90 U/L (ref 35–144)
BUN: 16 mg/dL (ref 7–25)
CO2: 24 mmol/L (ref 20–32)
Calcium: 9.7 mg/dL (ref 8.6–10.3)
Chloride: 106 mmol/L (ref 98–110)
Creat: 1.05 mg/dL (ref 0.70–1.35)
Globulin: 2.3 g/dL (calc) (ref 1.9–3.7)
Glucose, Bld: 131 mg/dL — ABNORMAL HIGH (ref 65–99)
Potassium: 4.2 mmol/L (ref 3.5–5.3)
Sodium: 140 mmol/L (ref 135–146)
Total Bilirubin: 0.9 mg/dL (ref 0.2–1.2)
Total Protein: 6.7 g/dL (ref 6.1–8.1)
eGFR: 78 mL/min/{1.73_m2} (ref >=60)

## 2023-01-10 LAB — LIPID PANEL
Cholesterol: 142 mg/dL (ref <200)
HDL: 49 mg/dL (ref >=40)
LDL Cholesterol (Calc): 79 mg/dL (calc)
Non-HDL Cholesterol (Calc): 93 mg/dL (calc) (ref <130)
Total CHOL/HDL Ratio: 2.9 (calc) (ref <5.0)
Triglycerides: 64 mg/dL (ref <150)

## 2023-01-12 ENCOUNTER — Other Ambulatory Visit: Payer: Self-pay

## 2023-01-12 ENCOUNTER — Telehealth: Payer: Self-pay | Admitting: Family Medicine

## 2023-01-12 ENCOUNTER — Telehealth: Payer: Self-pay

## 2023-01-12 DIAGNOSIS — I6381 Other cerebral infarction due to occlusion or stenosis of small artery: Secondary | ICD-10-CM

## 2023-01-12 DIAGNOSIS — I6310 Cerebral infarction due to embolism of unspecified precerebral artery: Secondary | ICD-10-CM

## 2023-01-12 DIAGNOSIS — F0150 Vascular dementia without behavioral disturbance: Secondary | ICD-10-CM

## 2023-01-12 MED ORDER — ATORVASTATIN CALCIUM 40 MG PO TABS: ORAL_TABLET | ORAL | 1 refills | Status: DC

## 2023-01-12 NOTE — Telephone Encounter (Signed)
Prescription Request  01/12/2023  LOV: 01/09/2023  What is the name of the medication or equipment?   atorvastatin (LIPITOR) 40 MG table  **NEW SCRIPT REQUESTED**  Have you contacted your pharmacy to request a refill? Yes   Which pharmacy would you like this sent to?  [facility], [place] [address] Phone: [phone] Fax: [phone]    Patient notified that their request is being sent to the clinical staff for review and that they should receive a response within 2 business days.   Please advise pharmacist.

## 2023-01-12 NOTE — Telephone Encounter (Signed)
Spoke w/pt's wife, [name]. Per wife, even though you stated that pt's labs are 'ok' she is  is a little concern about pt's glucose being at "131"  Pls advice?

## 2023-01-13 NOTE — Telephone Encounter (Signed)
Spoke w/pt's wife, regarding pcp's recommendations. Per wife, she will continue to monitor pt's sugar intake and will recheck it in a few months.   Wife voiced understanding and nothing further.

## 2023-01-16 ENCOUNTER — Encounter (INDEPENDENT_AMBULATORY_CARE_PROVIDER_SITE_OTHER): Payer: Self-pay | Admitting: Gastroenterology

## 2023-01-16 ENCOUNTER — Ambulatory Visit (INDEPENDENT_AMBULATORY_CARE_PROVIDER_SITE_OTHER): Payer: Medicare PPO | Admitting: Gastroenterology

## 2023-01-16 VITALS — BP 148/90 | HR 60 | Temp 97.5°F | Ht 74.0 in | Wt 185.9 lb

## 2023-01-16 DIAGNOSIS — K5100 Ulcerative (chronic) pancolitis without complications: Secondary | ICD-10-CM

## 2023-01-16 NOTE — Progress Notes (Signed)
Maylon Peppers, M.D. Gastroenterology & Hepatology [facility] [address] [place], [place] [address]  Primary Care Physician: [name], MD [address] [place] [address]  I will communicate my assessment and recommendations to the referring MD via EMR.  Problems: Ulcerative pancolitis GERD  History of Present Illness: Justin [name] is a 67 y.o. male with past medical history of ulcerative pancolitis, history of stroke, GERD, vascular dementia, hypertension and hyperlipidemia, who presents for follow up of ulcerative colitis.  The patient was last seen on 07/19/2019. At that time, the patient was continued on Protonix 40 mg every other day and sulfasalazine 1 g twice daily.  Colonoscopy was performed on 08/31/2022 showing Diverticulosis. Mayo 0 throughout the colon,  in remission. Repeat colonoscopy advised in 3 years.  Patient comes to the office with his wife. She states that she noticed for the last 2 weeks he is having more frequent bowel movements recently, as he used to have only one BM per day and is now having 2-3 Bms per day, usually after meal. He states stools can be hard or loose.  No melena or hematochezia. Occasionally he has some nausea without vomiting.   The patient denies having any fever, chills, hematochezia, melena, hematemesis, abdominal distention, abdominal pain, jaundice, pruritus or weight loss. No sick contacts.  Last labs on 01/09/2023 - CMP and CBC were WNL.  Last flu shot:2023 Last pneumonia shot:2023 Last zoster vaccine: never COVID-19 shot: never  Last Colonoscopy: as above.  Past Medical History: Past Medical History:  Diagnosis Date   Acid reflux A999333   Acute metabolic encephalopathy 123XX123   Anxiety    Arthritis    Cerebral embolism with cerebral infarction 04/15/2018   Chest pain 05/19/2015   Depression    EEG abnormality without seizure 05/19/2021   Encephalitis  04/30/2021   Encephalopathy due to COVID-19 virus    Episodic confusion 05/19/2021   Heart murmur    Hyperglycemia 05/19/2015   Hypertension    Left thalamic lacunar infarct 05/24/2018   OSA (obstructive sleep apnea)    cpap 9 cm H2O   Recurrent unilateral inguinal hernia with obstruction and without gangrene    SBO (small bowel obstruction)    Sleep apnea 3 years ago   Ulcerative colitis 09/24/2019   Vascular dementia without behavioral disturbance 05/19/2021    Past Surgical History: Past Surgical History:  Procedure Laterality Date   BIOPSY  06/21/2017   Procedure: BIOPSY;  Surgeon: Rogene Houston, MD;  [facility];  Service: Endoscopy;;  rectal, righta and left colon;   COLONOSCOPY N/A 06/21/2017   Procedure: COLONOSCOPY;  Surgeon: Rogene Houston, MD;  [facility];  Service: Endoscopy;  Laterality: N/A;  1:25   COLONOSCOPY WITH PROPOFOL N/A 08/31/2022   Procedure: COLONOSCOPY WITH PROPOFOL;  Surgeon: Harvel Quale, MD;  [facility];  Service: Gastroenterology;  Laterality: N/A;  1245 ASA 2   HERNIA REPAIR  3 years ago   INGUINAL HERNIA REPAIR Right 04/04/2018   Procedure: HERNIA REPAIR INGUINAL ADULT WITH MESH;  Surgeon: Aviva Signs, MD;  [facility];  Service: General;  Laterality: Right;   INGUINAL HERNIA REPAIR Right 01/31/2019   Procedure: RECURRENT RIGHT INGUINAL HERNIORRHAPY WITH MESH, INCARCERATED;  Surgeon: Aviva Signs, MD;  [facility];  Service: General;  Laterality: Right;   JOINT REPLACEMENT  July 19, 2021   Total knee repl.   KNEE SURGERY  tore mcl in high school (70's)   TOTAL KNEE ARTHROPLASTY Left 07/19/2021   Procedure: TOTAL KNEE ARTHROPLASTY;  Surgeon: Gaynelle Arabian, MD;  [facility];  Service: Orthopedics;  Laterality: Left;    Family History: Family History  Problem Relation Age of Onset   Tuberculosis Mother    Stroke Mother    Arthritis Mother    Cancer Mother     Heart disease Father        died at 44   Early death Father    Bradycardia Brother        pacemaker   Stroke Brother    Hypertension Brother    Mental illness Paternal Grandfather        suicide at 16   Suicidality Paternal Grandfather    Cancer Paternal Uncle    Dementia Maternal Uncle 93   Colon cancer Neg Hx     Social History: Social History   Tobacco Use  Smoking Status Never   Passive exposure: Past  Smokeless Tobacco Never   Social History   Substance and Sexual Activity  Alcohol Use No   Social History   Substance and Sexual Activity  Drug Use No    Allergies: No Known Allergies  Medications: Current Outpatient Medications  Medication Sig Dispense Refill   Ascorbic Acid (VITAMIN C) 500 MG CAPS Take 500 mg by mouth daily.     aspirin EC 81 MG tablet Take 81 mg by mouth daily. Swallow whole.     atorvastatin (LIPITOR) 40 MG tablet TAKE ONE TABLET BY MOUTH AT 6PM 90 tablet 1   CVS B-12 500 MCG tablet Take 500 mcg by mouth daily.     diphenhydrAMINE (BENADRYL) 25 MG tablet Take 25 mg by mouth daily as needed for allergies.     donepezil (ARICEPT) 5 MG tablet Take 1 tablet (5 mg total) by mouth at bedtime. 90 tablet 3   folic acid (FOLVITE) 1 MG tablet Take 1 mg by mouth daily.     losartan (COZAAR) 100 MG tablet TAKE ONE (1) TABLET BY MOUTH EVERY DAY 90 tablet 2   pantoprazole (PROTONIX) 40 MG tablet Take 1 tablet (40 mg total) by mouth daily. NEEDS OFFICE VISIT (Patient taking differently: Take 40 mg by mouth daily.) 90 tablet 3   sulfaSALAzine (AZULFIDINE) 500 MG tablet Take 2 tablets (1,000 mg total) by mouth 2 (two) times daily. 360 tablet 3   No current facility-administered medications for this visit.    Review of Systems: GENERAL: negative for malaise, night sweats HEENT: No changes in hearing or vision, no nose bleeds or other nasal problems. NECK: Negative for lumps, goiter, pain and significant neck swelling RESPIRATORY: Negative for cough,  wheezing CARDIOVASCULAR: Negative for chest pain, leg swelling, palpitations, orthopnea GI: SEE HPI MUSCULOSKELETAL: Negative for joint pain or swelling, back pain, and muscle pain. SKIN: Negative for lesions, rash PSYCH: Negative for sleep disturbance, mood disorder and recent psychosocial stressors. HEMATOLOGY Negative for prolonged bleeding, bruising easily, and swollen nodes. ENDOCRINE: Negative for cold or heat intolerance, polyuria, polydipsia and goiter. NEURO: negative for tremor, gait imbalance, syncope and seizures. The remainder of the review of systems is noncontributory.   Physical Exam: BP (!) 161/108 (BP Location: Left Arm, Patient Position: Sitting, Cuff Size: Large)   Pulse 79   Temp (!) 97.5 F (36.4 C) (Temporal)   Ht '6\' 2"'$  (1.88 m)   Wt 185 lb 14.4 oz (84.3 kg)   BMI 23.87 kg/m  GENERAL: The patient is AO  x3, in no acute distress. HEENT: Head is normocephalic and atraumatic. EOMI are intact. Mouth is well hydrated and without lesions. NECK: Supple. No masses LUNGS: Clear to auscultation. No presence of rhonchi/wheezing/rales. Adequate chest expansion HEART: RRR, normal s1 and s2. ABDOMEN: Soft, nontender, no guarding, no peritoneal signs, and nondistended. BS +. No masses. EXTREMITIES: Without any cyanosis, clubbing, rash, lesions or edema. NEUROLOGIC: AOx3, no focal motor deficit. SKIN: no jaundice, no rashes  Imaging/Labs: as above  I personally reviewed and interpreted the available labs, imaging and endoscopic files.  Impression and Plan: Justin [name] is a 67 y.o. male with past medical history of ulcerative pancolitis, history of stroke, GERD, vascular dementia, hypertension and hyperlipidemia, who presents for follow up of ulcerative colitis.  Patient was previously in clinical and endoscopic remission, but recently had exacerbation of clinical symptoms. He has not presented severe worsening of his diarrhea but the fecal frequency has increased despite  being compliant to medication. We will rule out infectious etiologies with stool testing . Will perform non invasive monitoring of UC with fecal calprotectin as well.  - Check stool testing for C. Diff, GI path in stool and fecal calprotectin - Continue sulfasalazine 1 g twice a day - Continue folic acid daily  All questions were answered.      Maylon Peppers, MD Gastroenterology and Hepatology [facility]

## 2023-01-16 NOTE — Patient Instructions (Signed)
Check stool testing Continue sulfasalazine 1 g twice a day Continue folic acid

## 2023-01-26 LAB — C. DIFFICILE GDH AND TOXIN A/B
GDH ANTIGEN: NOT DETECTED
MICRO NUMBER:: 14694418
SPECIMEN QUALITY:: ADEQUATE
TOXIN A AND B: NOT DETECTED

## 2023-01-26 LAB — GASTROINTESTINAL PATHOGEN PNL
CampyloBacter Group: NOT DETECTED
Norovirus GI/GII: NOT DETECTED
Rotavirus A: NOT DETECTED
Salmonella species: NOT DETECTED
Shiga Toxin 1: NOT DETECTED
Shiga Toxin 2: NOT DETECTED
Shigella Species: NOT DETECTED
Vibrio Group: NOT DETECTED
Yersinia enterocolitica: NOT DETECTED

## 2023-01-26 LAB — CALPROTECTIN: Calprotectin: 229 mcg/g — ABNORMAL HIGH

## 2023-01-30 ENCOUNTER — Other Ambulatory Visit (INDEPENDENT_AMBULATORY_CARE_PROVIDER_SITE_OTHER): Payer: Self-pay | Admitting: Gastroenterology

## 2023-01-30 DIAGNOSIS — K5100 Ulcerative (chronic) pancolitis without complications: Secondary | ICD-10-CM

## 2023-01-30 DIAGNOSIS — K219 Gastro-esophageal reflux disease without esophagitis: Secondary | ICD-10-CM

## 2023-02-06 ENCOUNTER — Other Ambulatory Visit (INDEPENDENT_AMBULATORY_CARE_PROVIDER_SITE_OTHER): Payer: Self-pay

## 2023-02-13 ENCOUNTER — Ambulatory Visit: Payer: Medicare PPO | Admitting: Adult Health

## 2023-03-08 ENCOUNTER — Ambulatory Visit: Payer: Medicare PPO | Admitting: Neurology

## 2023-03-08 ENCOUNTER — Encounter: Payer: Self-pay | Admitting: Neurology

## 2023-03-08 ENCOUNTER — Other Ambulatory Visit: Payer: Self-pay | Admitting: Family Medicine

## 2023-03-08 VITALS — BP 154/83 | HR 64 | Ht 72.0 in | Wt 179.0 lb

## 2023-03-08 DIAGNOSIS — F01B18 Vascular dementia, moderate, with other behavioral disturbance: Secondary | ICD-10-CM

## 2023-03-08 DIAGNOSIS — Z9989 Dependence on other enabling machines and devices: Secondary | ICD-10-CM

## 2023-03-08 DIAGNOSIS — Z8673 Personal history of transient ischemic attack (TIA), and cerebral infarction without residual deficits: Secondary | ICD-10-CM

## 2023-03-08 MED ORDER — DONEPEZIL HCL 5 MG PO TABS: 5.0000 mg | ORAL_TABLET | Freq: Two times a day (BID) | ORAL | 6 refills | Status: DC

## 2023-03-08 NOTE — Patient Instructions (Signed)
Neurocognitive Disorder, Moderate  Neurocognitive disorder, formerly known as mild cognitive impairment, is a disorder in which memory does not work as well as it should. This disorder may also cause problems with other mental functions, including thought, communication, behavior, and completion of tasks.  These problems can be noticed and measured, but they usually do not interfere with daily activities or the ability to live independently. Mild neurocognitive disorder typically develops after 68 years of age, but it can also develop at younger ages. It is not as serious as major neurocognitive disorder, also known as dementia, but it may be the first sign of it. Generally, symptoms of this condition get worse over time. In rare cases, symptoms can get better. What are the causes? This condition may be caused by: Brain disorders like Alzheimer's disease, Parkinson's disease, and other conditions that gradually damage nerve cells (neurodegenerative conditions). Diseases that affect blood vessels in the brain and result in small strokes. Certain infections, encephalitis.  Traumatic brain injury. Other medical conditions, such as brain tumors, underactive thyroid (hypothyroidism), and vitamin B12 deficiency. Use of certain drugs or prescription medicines. What increases the risk? The following factors may make you more likely to develop this condition: Being older than 65 years. Being male. Low education level. Diabetes, high blood pressure, high cholesterol, and other conditions that increase the risk for blood vessel diseases. Untreated or undertreated sleep apnea. Having a certain type of gene that can be passed from parent to child (inherited). Chronic health problems such as heart disease, lung disease, liver disease, kidney disease, or depression. What are the signs or symptoms? Symptoms of this condition include: Difficulty remembering. You may: Forget names, phone numbers, or details of  recent events. Forget social events and appointments. Repeatedly forget where you put your car keys or other items. Difficulty thinking and solving problems. You may have trouble with complex tasks, such as: Paying bills. Driving in unfamiliar places. Difficulty communicating. You may have trouble: Finding the right word or naming an object. Forming a sentence that makes sense, or understanding what you read or hear. Changes in your behavior or personality. When this happens, you may: Lose interest in the things that you used to enjoy. Withdraw from social situations. Get angry more easily than usual. Act before thinking. How is this diagnosed? This condition is diagnosed based on: Your symptoms. Your health care provider may ask you and the people you spend time with, such as family and friends, about your symptoms. Evaluation of mental functions (neuropsychological testing). Your health care provider may refer you to a neurologist or mental health specialist to evaluate your mental functions in detail. To identify the cause of your condition, your health care provider may: Get a detailed medical history. Ask about use of alcohol, drugs, and prescription medicines. Do a physical exam. Order blood tests and brain imaging exams. How is this treated? Mild neurocognitive disorder that is caused by medicine use, drug use, infection, or another medical condition may improve when the cause is treated, or when medicines or drugs are stopped. If this disorder has another cause, it generally does not improve and may get worse. In these cases, the goal of treatment is to help you manage the loss of mental function. Treatments in these cases include: Medicine. Medicine mainly helps memory and behavior symptoms. Talk therapy. Talk therapy provides education, emotional support, memory aids, and other ways of making up for problems with mental function. Lifestyle changes, including: Getting regular  exercise. Eating a healthy diet  that includes omega-3 fatty acids. Challenging your thinking and memory skills. Having more social interaction. Follow these instructions at home: Eating and drinking  Drink enough fluid to keep your urine pale yellow. Eat a healthy diet that includes omega-3 fatty acids. These can be found in: Fish. Nuts. Leafy vegetables. Vegetable oils. If you drink alcohol: Limit how much you use to: 0-1 drink a day for women. 0-2 drinks a day for men. Be aware of how much alcohol is in your drink. In the [place] one drink equals one 12 oz bottle of beer (355 mL), one 5 oz glass of wine (148 mL), or one 1 oz glass of hard liquor (44 mL). Lifestyle  Get regular exercise as told by your health care provider. Do not use any products that contain nicotine or tobacco, such as cigarettes, e-cigarettes, and chewing tobacco. If you need help quitting, ask your health care provider. Practice ways to manage stress. If you need help managing stress, ask your health care provider. Continue to have social interaction. Keep your mind active with stimulating activities you enjoy, such as reading or playing games. Make sure to get quality sleep. Follow these tips: Avoid napping during the day. Keep your sleeping area dark and cool. Avoid exercising during the few hours before you go to bed. Avoid caffeine products in the evening. General instructions Take over-the-counter and prescription medicines only as told by your health care provider. Your health care provider may recommend that you avoid taking medicines that can affect thinking, such as pain medicines or sleep medicines. Work with your health care provider to find out what you need help with and what your safety needs are. Keep all follow-up visits. This is important. Where to find more information National Institute on Aging: www.nia.nih.gov Contact a health care provider if: You have any new symptoms. Get help right  away if: You develop new confusion or your confusion gets worse. You act in ways that place you or your family in danger. Summary Mild neurocognitive disorder is a disorder in which memory does not work as well as it should. Mild neurocognitive disorder can have many causes. It may be the first stage of dementia. To manage your condition, get regular exercise, keep your mind active, get quality sleep, and eat a healthy diet. This information is not intended to replace advice given to you by your health care provider. Make sure you discuss any questions you have with your health care provider. Document Revised: 03/09/2020 Document Reviewed: 03/09/2020 [employer] Patient Education  2023 [employer]  

## 2023-03-08 NOTE — Telephone Encounter (Signed)
Requested Prescriptions  Pending Prescriptions Disp Refills   losartan (COZAAR) 100 MG tablet [Pharmacy Med Name: LOSARTAN POTASSIUM 100 MG TAB] 90 tablet 0    Sig: TAKE 1 TABLET BY MOUTH EVERY DAY     Cardiovascular:  Angiotensin Receptor Blockers Failed - 03/08/2023  1:04 AM      Failed - Last BP in normal range    BP Readings from Last 1 Encounters:  03/08/23 (!) 154/83         Failed - Valid encounter within last 6 months    Recent Outpatient Visits           1 year ago Benign essential HTN   [facility] Pickard, Priscille Heidelberg, MD   1 year ago Memory loss   [facility] Donita Brooks, MD   1 year ago Cerebrovascular accident (CVA) due to occlusion of small artery (HCC)   [facility] Donita Brooks, MD   2 years ago Cerebrovascular accident (CVA) due to occlusion of small artery (HCC)   [facility] Donita Brooks, MD   2 years ago Atypical pneumonia   [facility] Pickard, Priscille Heidelberg, MD              Passed - Cr in normal range and within 180 days    Creat  Date Value Ref Range Status  01/09/2023 1.05 0.70 - 1.35 mg/dL Final         Passed - K in normal range and within 180 days    Potassium  Date Value Ref Range Status  01/09/2023 4.2 3.5 - 5.3 mmol/L Final         Passed - Patient is not pregnant

## 2023-03-08 NOTE — Progress Notes (Signed)
Provider:  Melvyn Novas, MD  Primary Care Physician:  Donita Brooks, MD [address] [place] [address]     Referring Provider: Ihor Austin, NP, for [facility]          Chief Complaint according to patient   Patient presents with:     Stroke and encephalitis Patient (Initial Visit)           HISTORY OF PRESENT ILLNESS:  Justin [name] is a 67 y.o. male patient who is followed by me for Sleep apnea, and by Dr Pearlean Brownie for stroke and vascular dementia- here on a revisit 03/08/2023 for a cognitive follow up ? Vascular dementia is his main diagnosis now,and he should have been seen by the stroke MD/ NP> He is having  mainly problems with short term memory an much more pronounced after encephalitis.  Last visit with Stroke NP on 09-14-2022: Ihor Austin NP had initiated a new vascular work up , she evaluated this patient by cardiac monitor, which did not pick up any abnormality,  there has been no new stroke over the last 2 years. Imaging studies  show no new vascular event.   He contracted Covid encephalitis in 5/ 2022. In September 2022 he had undergone Total Knee replacement without difficulties.  He has not worked since May 2022. He has also refrain from driving - wife questions his ability to return to driving as too impaired.   His wife feels his memory has been stabilized at this lower level. MMSE today was 18/ 30, he cannot follow a MOCA. Following directions has been difficult for him. He cannot process verbal information as well, and he feels easily distracted.  He still gives examples of daily work , also he hasn't worked in 2 years. He relies on routines- when those are interrupted, he feels off. He suffered a left thalamic stroke and last scored 123/ 30 points on MMSE.         Last OSA on CPAP: JessMcCue, NP    Past 30-day compliance report shows 30 out of 30 usage days with 21 days greater than 4 hours for 70% compliance.  Average  usage 4 hours and 29 minutes.  Residual AHI 1.6 on set pressure of 9.  Leaks in the 95 percentile 9.9. does have some issues sleeping at times, can become uncomfortable in bed and will move to recliner without his CPAP. If wife wakes up when he is in the recliner, she will wake him up to go back to bed to use CPAP.  He has no complaints regarding CPAP.    Update 09/14/2022 JM: Patient returns for 68-month follow-up.  He is accompanied by his wife.   Reports cognition has been stable since prior visit per both patient and wife. Can have good days and bad days.  No longer works.  MMSE today 21/30 (prior 18/30). Remains on Aricept.  PCP discontinued Lexapro as no longer having issues with anxiety/depression, has been doing well since stopping.   Denies any new stroke/TIA symptoms. Compliant on aspirin and atorvastatin.  Blood pressure well controlled.   In regards to sleep apnea, review of compliance report shows adequate usage with 29 out of 30 usage days and 23 days greater than 4 hours for 77% compliance.  Optimal residual AHI of 1.9.  Set pressure 9 with EPR level 3.  Leaks in the 95th percentile 15.1.  Continues to tolerate CPAP well.  Can  have some nights where he has difficulty sleeping but does not happen routinely.  He is up-to-date with supplies routinely follows with DME company. History provided for reference purposes only Update 03/14/2022 JM: Patient returns for stroke and dementia follow-up after prior visit 09/27/2021.  He is accompanied by his wife.  Vascular dementia:  Hx of stroke:  Per patient Does have issues with short term memory - believes some decline since prior visit Is able to maintain majority ADLs independently but wife does need to pick out clothes as it may take him too long Does have difficulty focusing or may lose train of thought or get distracted easily Reports he is still working and going to job sites. 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 Thankfully, wife was able to find him before doing any work and he was able to be brought home safely. This has not reoccurred since then.  They now have locations set up on cell phone.    MMSE today 18/30 (prior 20/30) He has remained on Aricept 5 mg daily and Lexapro 20 mg daily.  Denies side effects. Did have repeat MRI 3/22 after presenting to ED with left facial swelling which was negative for acute findings.  Compliant on aspirin and atorvastatin, denies side effects.  Blood pressure today 141/76.     His Stroke occurred within a week post inguinal hernia surgery 7-June 2019, he was in follow up under Dr Ronnald Ramp care.        Review of Systems: Out of a complete 14 system review, the patient complains of only the following symptoms, and all other reviewed systems are negative.:      How likely are you to doze in the following situations: 0 = not likely, 1 = slight chance, 2 = moderate chance, 3 = high chance   Sitting and Reading? Watching Television? Sitting inactive in a public place (theater or meeting)? As a passenger in a car for an hour without a break? Lying down in the afternoon when circumstances permit? Sitting and talking to someone? Sitting quietly after lunch without alcohol? In a car, while stopped for a few minutes in traffic?   Total = 5/ 24 points   FSS endorsed at 19/ 63 points.  mini-mental status exam    03/08/2023   12:45 PM 09/14/2022    1:35 PM 03/14/2022    2:22 PM 09/27/2021    9:05 AM 05/19/2021    3:27 PM 03/29/2021    9:55 AM 09/28/2020    9:38 AM  MMSE - Mini Mental State Exam  Orientation to time 3 4 3 3 2 3 4    Orientation to Place 4 5 4 4 4 5 5   Registration 3 3 3 3 3 3 3   Attention/ Calculation 1 1 0 1 1 5 2   Recall 0 0 0 0 0 0 0  Language- name 2 objects 2 2 2 2 2 2 2   Language- repeat 1 1 1 1 1 1 1   Language- follow 3 step command 3 3 3 3 2 3 3   Language- read & follow direction 0 1 1 1  0 1  1  Write a sentence 1 1 1 1 1 1 1   Copy design 0 0 0 1 0 0 0  Copy design-comments       6 animals  Total score 18 21 18 20 16 24 22           No data to display           Social History   Socioeconomic History   Marital status: Married    Spouse name: Burna Mortimer   Number of children: Not on file   Years of education: 12   Highest education level: High school graduate  Occupational History   Occupation: Unemployed/retired    Comment: Personnel officer  Tobacco Use   Smoking status: Never    Passive exposure: Past   Smokeless tobacco: Never  Vaping Use   Vaping Use: Never used  Substance and Sexual Activity   Alcohol use: No   Drug use: No   Sexual activity: Yes    Birth control/protection: None  Other Topics Concern   Not on file  Social History Narrative   Lives with wife   Social Determinants of Health   Financial Resource Strain: Low Risk  (08/25/2022)   Overall Financial Resource Strain (CARDIA)    Difficulty of Paying Living Expenses: Not hard at all  Food Insecurity: No Food Insecurity (08/25/2022)   Hunger Vital Sign    Worried About Running Out of Food in the Last Year: Never true    Ran Out of Food in the Last Year: Never true  Transportation Needs: No Transportation Needs (08/25/2022)   PRAPARE - Administrator, Civil Service (Medical): No    Lack of Transportation (Non-Medical): No  Physical Activity: Sufficiently Active (08/25/2022)   Exercise Vital Sign    Days of Exercise per Week: 7 days    Minutes of Exercise per Session: 30 min  Stress: No Stress Concern Present (08/25/2022)   [employer] of Occupational Health - Occupational Stress  Questionnaire    Feeling of Stress : Only a little  Social Connections: Moderately Integrated (08/25/2022)   Social Connection and Isolation Panel [NHANES]    Frequency of Communication with Friends and Family: Once a week    Frequency of Social Gatherings with Friends and Family: Once a week    Attends Religious Services: 1 to 4 times per year    Active Member of [employer] or Organizations: Yes    Attends Banker Meetings: 1 to 4 times per year    Marital Status: Married    Family History  Problem Relation Age of Onset   Tuberculosis Mother    Stroke Mother    Arthritis Mother    Cancer Mother    Heart disease Father        died at 53   Early death Father    Bradycardia Brother        pacemaker   Stroke Brother    Hypertension Brother    Mental illness Paternal Grandfather        suicide at 84   Suicidality Paternal Grandfather    Cancer Paternal Uncle    Dementia Maternal Uncle 38   Colon cancer Neg Hx     Past Medical History:  Diagnosis Date   Acid reflux 04/27/2021   Acute metabolic encephalopathy 04/30/2021   Anxiety    Arthritis    Cerebral embolism with cerebral infarction 04/15/2018   Chest pain 05/19/2015   Depression    EEG abnormality without seizure  05/19/2021   Encephalitis 04/30/2021   Encephalopathy due to COVID-19 virus    Episodic confusion 05/19/2021   Heart murmur    Hyperglycemia 05/19/2015   Hypertension    Left thalamic lacunar infarct 05/24/2018   OSA (obstructive sleep apnea)    cpap 9 cm H2O   Recurrent unilateral inguinal hernia with obstruction and without gangrene    SBO (small bowel obstruction)    Sleep apnea 3 years ago   Ulcerative colitis 09/24/2019   Vascular dementia without behavioral disturbance 05/19/2021    Past Surgical History:  Procedure Laterality Date   BIOPSY  06/21/2017   Procedure: BIOPSY;  Surgeon: Malissa Hippo, MD;  [facility];  Service: Endoscopy;;  rectal, righta and left colon;    COLONOSCOPY N/A 06/21/2017   Procedure: COLONOSCOPY;  Surgeon: Malissa Hippo, MD;  Location: [facility];  Service: Endoscopy;  Laterality: N/A;  1:25   COLONOSCOPY WITH PROPOFOL N/A 08/31/2022   Procedure: COLONOSCOPY WITH PROPOFOL;  Surgeon: Dolores Frame, MD;  Location: [facility];  Service: Gastroenterology;  Laterality: N/A;  1245 ASA 2   HERNIA REPAIR  3 years ago   INGUINAL HERNIA REPAIR Right 04/04/2018   Procedure: HERNIA REPAIR INGUINAL ADULT WITH MESH;  Surgeon: Franky Macho, MD;  Location: [facility];  Service: General;  Laterality: Right;   INGUINAL HERNIA REPAIR Right 01/31/2019   Procedure: RECURRENT RIGHT INGUINAL HERNIORRHAPY WITH MESH, INCARCERATED;  Surgeon: Franky Macho, MD;  Location: [facility];  Service: General;  Laterality: Right;   JOINT REPLACEMENT  July 19, 2021   Total knee repl.   KNEE SURGERY     tore mcl in high school (70's)   TOTAL KNEE ARTHROPLASTY Left 07/19/2021   Procedure: TOTAL KNEE ARTHROPLASTY;  Surgeon: Ollen Gross, MD;  Location: [facility];  Service: Orthopedics;  Laterality: Left;     Current Outpatient Medications on File Prior to Visit  Medication Sig Dispense Refill   Ascorbic Acid (VITAMIN C) 500 MG CAPS Take 500 mg by mouth daily.     aspirin EC 81 MG tablet Take 81 mg by mouth daily. Swallow whole.     atorvastatin (LIPITOR) 40 MG tablet TAKE ONE TABLET BY MOUTH AT 6PM 90 tablet 1   [employer] B-12 500 MCG tablet Take 500 mcg by mouth daily.     diphenhydrAMINE (BENADRYL) 25 MG tablet Take 25 mg by mouth daily as needed for allergies.     donepezil (ARICEPT) 5 MG tablet Take 1 tablet (5 mg total) by mouth at bedtime. 90 tablet 3   folic acid (FOLVITE) 1 MG tablet Take 1 mg by mouth daily.     losartan (COZAAR) 100 MG tablet TAKE ONE (1) TABLET BY MOUTH EVERY DAY 90 tablet 2   pantoprazole (PROTONIX) 40 MG tablet Take 1 tablet (40 mg total) by mouth daily. 90 tablet 1   sulfaSALAzine (AZULFIDINE) 500 MG tablet TAKE 2 TABLETS  BY MOUTH TWICE A DAY 360 tablet 3   No current facility-administered medications on file prior to visit.    No Known Allergies   DIAGNOSTIC DATA (LABS, IMAGING, TESTING) - I reviewed patient records, labs, notes, testing and imaging myself where available.  Lab Results  Component Value Date   WBC 6.3 01/09/2023   HGB 15.0 01/09/2023   HCT 43.5 01/09/2023   MCV 93.8 01/09/2023   PLT 295 01/09/2023      Component Value Date/Time   NA 140 01/09/2023 1448   NA 139 09/26/2019  1132   K 4.2 01/09/2023 1448   CL 106 01/09/2023 1448   CO2 24 01/09/2023 1448   GLUCOSE 131 (H) 01/09/2023 1448   BUN 16 01/09/2023 1448   BUN 19 09/26/2019 1132   CREATININE 1.05 01/09/2023 1448   CALCIUM 9.7 01/09/2023 1448   PROT 6.7 01/09/2023 1448   PROT 7.1 09/26/2019 1132   ALBUMIN 3.2 (L) 05/01/2021 0136   ALBUMIN 4.4 09/26/2019 1132   AST 21 01/09/2023 1448   ALT 16 01/09/2023 1448   ALKPHOS 64 05/01/2021 0136   BILITOT 0.9 01/09/2023 1448   BILITOT 0.8 09/26/2019 1132   GFRNONAA >60 01/26/2022 1131   GFRNONAA 80 12/21/2020 0928   GFRAA 92 12/21/2020 0928   Lab Results  Component Value Date   CHOL 142 01/09/2023   HDL 49 01/09/2023   LDLCALC 79 01/09/2023   TRIG 64 01/09/2023   CHOLHDL 2.9 01/09/2023   Lab Results  Component Value Date   HGBA1C 5.2 04/14/2018   Lab Results  Component Value Date   VITAMINB12 722 09/27/2021   Lab Results  Component Value Date   TSH 2.10 06/23/2022    PHYSICAL EXAM:  Today's Vitals   03/08/23 1232  BP: (!) 154/83  Pulse: 64  Weight: 179 lb (81.2 kg)  Height: 6' (1.829 m)   Body mass index is 24.28 kg/m.   Wt Readings from Last 3 Encounters:  03/08/23 179 lb (81.2 kg)  01/16/23 185 lb 14.4 oz (84.3 kg)  01/09/23 184 lb (83.5 kg)     Ht Readings from Last 3 Encounters:  03/08/23 6' (1.829 m)  01/16/23 6\' 2"  (1.88 m)  01/09/23 6\' 1"  (1.854 m)      General: The patient is awake, alert and appears not in acute distress. The  patient is well groomed. Head: Normocephalic, atraumatic. Neck is supple.  Mallampati 1,  neck circumference:16 inches . Nasal airflow  patent.  Retrognathia is not seen.  Dental status: biological  Cardiovascular:  Regular rate and cardiac rhythm by pulse,  without distended neck veins. Respiratory: Lungs are clear to auscultation.  Skin:  Without evidence of ankle edema, or rash. Trunk: The patient's posture is erect. BMI 24.3   NEUROLOGIC EXAM: The patient is awake and alert, oriented to place and time.   Memory subjective described as intact.  Attention span & concentration ability appears normal.  Speech is non-fluent,  without  dysarthria, dysphonia but with  descripitive words used to fill in gaps.  Mood and affect are appropriate.   Cranial nerves: no loss of smell or taste reported  Pupils are equal and briskly reactive to light. Extraocular movements in vertical and horizontal planes were intact and without nystagmus. No Diplopia. Visual fields by finger perimetry are intact. Hearing was intact to soft voice and finger rubbing.    Facial sensation intact to fine touch.  Facial motor strength is symmetric and tongue and uvula move midline.  Neck ROM : rotation, tilt and flexion extension were normal for age and shoulder shrug was symmetrical.    Motor exam:  Symmetric bulk, tone and ROM.   Normal tone without cog -wheeling, asymmetric grip strength . Right hand slightly weaker, handwriting changes in right dominant person.     Sensory: unimpaired.   Coordination: Rapid alternating movements in the fingers/hands were of normal speed.  The Finger-to-nose maneuver was intact without evidence of ataxia, dysmetria or tremor. His handwriting has changed.    Gait and station: Patient could rise unassisted from  a seated position, walked without assistive device.  Stance is of normal width/ base.  Toe and heel walk were deferred.  Deep tendon reflexes: in the  upper and lower  extremities are symmetric and intact.  Babinski response was deferred .    ASSESSMENT AND PLAN   Dear Dr. Pearlean Brownie and NP JessicaMcCue,  67 y.o. year old male former stroke Patient here with:    1)  moderate dementia - neurocognitive impairment.  vascular dementia/ postencephalitic dementia and personality changes.  MMSE has been worse and has been better in the past, no trend. Increase to Aricept 5 mg bid because of GI sensitivity, ulcerative colitis .  10 mg dose was not well tolerated with bradycardia and GI symptoms.    2) OSA- machine was set up in 12-2017, sleep apnea is treated by compliant CPAP therapy .  Justin [name] has used the machine 100% for the last 30 days actually 100% for the last 90 days.  His hourly compliance fall sometimes short however he reaches an average of 4 hours and 50 minutes each night and compliance by hours is over 70%.  CPAP is set at 9 cm water pressure with an expiratory relief setting of 3 cm water pressure.  His residual AHI is 1.0 and his 95th percentile air leak is moderate at 23.8 L/min.  I think that his machine is ready to be replaced by a 1 soon.  I first encountered this patient in 2019, his machine is old and needs replacement.   3) BMI has not changed , sleepiness is better than in 2019,  should get a new baseline by HST.   Encephalitis  and stroke certainly affected the cognitive function- he never had proven atrial fib.   I ordered a HST for the time from June 8th on-   I plan to follow up either personally or through our NP within 6 months.   I would like to thank June Leap  and Donita Brooks, Md [address] [place],  [place] [address] for allowing me to meet with and to take care of this pleasant patient.   CC: I will share my notes with Dr Pearlean Brownie.  After spending a total time of  45  minutes face to face and additional time for physical and neurologic examination, review of laboratory studies,  personal review of imaging  studies, reports and results of other testing and review of referral information / records as far as provided in visit,   Electronically signed by: Melvyn Novas, MD 03/08/2023 1:00 PM  [facility] Board certified by The [employer] of Sleep Medicine and Diplomate of the [employer] of Sleep Medicine. Board certified In Neurology through the ABPN, Fellow of the [employer] of Neurology. Medical Director of [facility].

## 2023-03-15 ENCOUNTER — Telehealth: Payer: Self-pay | Admitting: Neurology

## 2023-03-15 ENCOUNTER — Encounter: Payer: Self-pay | Admitting: Neurology

## 2023-03-15 NOTE — Telephone Encounter (Signed)
03/15/23 left VM KS 03/08/23 [employer] no auth req EE

## 2023-03-24 ENCOUNTER — Other Ambulatory Visit (INDEPENDENT_AMBULATORY_CARE_PROVIDER_SITE_OTHER): Payer: Self-pay

## 2023-03-24 ENCOUNTER — Encounter (INDEPENDENT_AMBULATORY_CARE_PROVIDER_SITE_OTHER): Payer: Self-pay

## 2023-03-24 DIAGNOSIS — I10 Essential (primary) hypertension: Secondary | ICD-10-CM

## 2023-03-24 DIAGNOSIS — K5100 Ulcerative (chronic) pancolitis without complications: Secondary | ICD-10-CM

## 2023-03-27 ENCOUNTER — Other Ambulatory Visit (INDEPENDENT_AMBULATORY_CARE_PROVIDER_SITE_OTHER): Payer: Self-pay | Admitting: Gastroenterology

## 2023-03-27 DIAGNOSIS — K5100 Ulcerative (chronic) pancolitis without complications: Secondary | ICD-10-CM

## 2023-04-10 MED ORDER — DONEPEZIL HCL 10 MG PO TABS: 10.0000 mg | ORAL_TABLET | Freq: Every day | ORAL | 1 refills | Status: DC

## 2023-04-10 NOTE — Addendum Note (Signed)
Addended by: Berna Spare A on: 04/10/2023 08:22 AM   Modules accepted: Orders

## 2023-04-12 ENCOUNTER — Ambulatory Visit (INDEPENDENT_AMBULATORY_CARE_PROVIDER_SITE_OTHER): Payer: Medicare PPO | Admitting: Neurology

## 2023-04-12 DIAGNOSIS — Z9989 Dependence on other enabling machines and devices: Secondary | ICD-10-CM

## 2023-04-12 DIAGNOSIS — G4733 Obstructive sleep apnea (adult) (pediatric): Secondary | ICD-10-CM | POA: Diagnosis not present

## 2023-04-12 DIAGNOSIS — Z8673 Personal history of transient ischemic attack (TIA), and cerebral infarction without residual deficits: Secondary | ICD-10-CM

## 2023-04-12 DIAGNOSIS — F01B18 Vascular dementia, moderate, with other behavioral disturbance: Secondary | ICD-10-CM

## 2023-04-13 NOTE — Progress Notes (Signed)
[facility]   HOME SLEEP TEST REPORT ( by Watch PAT)   STUDY DATA:  04-13-2023 Justin [name] 67 year old male Jan 22, 1956   PRIMARY NEUROLOGIST:  Delia Heady, MD / Ihor Austin, NP ( on  leave)    ORDERING CLINICIAN:  Melvyn Novas, MD for Ihor Austin NP ( on medical leave )   REFERRING CLINICIAN: Dr Tanya Nones, MD    CLINICAL INFORMATION/HISTORY: Last visit with Stroke NP on 09-14-2022: Ihor Austin NP had initiated a new vascular work up , she evaluated this patient by cardiac monitor, which did not pick up any abnormality,  there has been no new stroke over the last 2 years. Imaging studies  show no new vascular event.   He contracted Covid encephalitis in 5/ 2022. In September 2022 he had undergone Total Knee replacement without difficulties.  He has not worked since May 2022. He has also refrain from driving - wife questions his ability to return to driving as too impaired.   Follow up or vascular dementia needs to be scheduled with Dr Pearlean Brownie Ihor Austin, NP.   CPAP dependent patient with history of severe OSA/.      Epworth sleepiness score: 05 /24. FSS at 19/63 points.    BMI:  24.28 kg/m   Neck Circumference: 16"   FINDINGS:   Sleep Summary:   Total Recording Time (hours, min): 8 hours 32 minutes   Total Sleep Time (hours, min):     6 hours 0 minutes            Percent REM (%):   20%                                     Respiratory Indices following CMS criteria:   Calculated pAHI (per hour):   18.2/h, following AASM criteria this would be 23.6/h.                         REM pAHI: 31.1/h                                                NREM pAHI: 15/h                            Positional AHI: Supine sleep was associated with an AHI of 24 following AASM criteria and 13.2/h following CMS criteria.     Mild snoring was noted.                                               Oxygen Saturation Statistics:     O2 Saturation Range (%):     Between a nadir at 76% and a maximum of 99% with a mean saturation at 94%.                                   O2 Saturation (minutes) <89%:    2.8 minutes       Pulse Rate Statistics:  Pulse Mean (bpm): 56 bpm               Pulse Range: Highly variable-between 33 and 98 bpm               IMPRESSION:  This HST confirms the presence of mild to moderate obstructive sleep apnea following CMS criteria. When applying AASM criteria this patient has clearly moderate to moderate severe sleep apnea. The apnea is REM sleep dependent.  I would strongly advise to continue CPAP therapy with the settings that have been used since 2019. 9 cm water pressure with 3 cm EPR, continue using the current interface and humidifier settings.  I would prefer if the patient will follow-up with [place] Derwood Kaplan after 90 days of CPAP use and also to follow-up on his vascular dementia.  As stated before the primary neurologist is Dr. Pearlean Brownie.   RECOMMENDATION:    INTERPRETING PHYSICIAN:   Melvyn Novas, MD   Medical Director of [facility].

## 2023-04-17 NOTE — Procedures (Signed)
       [facility]   HOME SLEEP TEST REPORT ( by Watch PAT)   STUDY DATA:  04-13-2023 Justin [name] 66 year old male 09/15/1956   PRIMARY NEUROLOGIST:  Pramod Sethi, MD / Jessica McCue, NP ( on  leave)    ORDERING CLINICIAN:  Sonjia Wilcoxson, MD for Jessica McCue NP ( on medical leave )   REFERRING CLINICIAN: Dr Pickard, MD    CLINICAL INFORMATION/HISTORY: Last visit with Stroke NP on 09-14-2022: Jessica McCue NP had initiated a new vascular work up , she evaluated this patient by cardiac monitor, which did not pick up any abnormality,  there has been no new stroke over the last 2 years. Imaging studies  show no new vascular event.   He contracted Covid encephalitis in 5/ 2022. In September 2022 he had undergone Total Knee replacement without difficulties.  He has not worked since May 2022. He has also refrain from driving - wife questions his ability to return to driving as too impaired.   Follow up or vascular dementia needs to be scheduled with Dr Sethi/ Jessica McCue, NP.   CPAP dependent patient with history of severe OSA/.      Epworth sleepiness score: 05 /24. FSS at 19/63 points.    BMI:  24.28 kg/m   Neck Circumference: 16"   FINDINGS:   Sleep Summary:   Total Recording Time (hours, min): 8 hours 32 minutes   Total Sleep Time (hours, min):     6 hours 0 minutes            Percent REM (%):   20%                                     Respiratory Indices following CMS criteria:   Calculated pAHI (per hour):   18.2/h, following AASM criteria this would be 23.6/h.                         REM pAHI: 31.1/h                                                NREM pAHI: 15/h                            Positional AHI: Supine sleep was associated with an AHI of 24 following AASM criteria and 13.2/h following CMS criteria.     Mild snoring was noted.                                               Oxygen Saturation Statistics:     O2 Saturation Range (%):     Between a nadir at 76% and a maximum of 99% with a mean saturation at 94%.                                   O2 Saturation (minutes) <89%:    2.8 minutes       Pulse Rate Statistics:     Pulse Mean (bpm): 56 bpm               Pulse Range: Highly variable-between 33 and 98 bpm               IMPRESSION:  This HST confirms the presence of mild to moderate obstructive sleep apnea following CMS criteria. When applying AASM criteria this patient has clearly moderate to moderate severe sleep apnea. The apnea is REM sleep dependent.  I would strongly advise to continue CPAP therapy with the settings that have been used since 2019. 9 cm water pressure with 3 cm EPR, continue using the current interface and humidifier settings.  I would prefer if the patient will follow-up with us Jessica McHugh after 90 days of CPAP use and also to follow-up on his vascular dementia.  As stated before the primary neurologist is Dr. Sethi.   RECOMMENDATION:    INTERPRETING PHYSICIAN:   Sahory Nordling, MD   Medical Director of [facility].                         

## 2023-04-17 NOTE — Progress Notes (Signed)
I would prefer if this stroke patient will follow-up with Ihor Austin after 60- 90 days of CPAP use and also to follow-up on his vascular dementia.  As stated before, the primary neurologist is Dr. Pearlean Brownie.  I replaced the old CPAP machine with a new one at the same settings.

## 2023-04-18 ENCOUNTER — Telehealth: Payer: Self-pay

## 2023-04-18 NOTE — Telephone Encounter (Signed)
-----   Message from Melvyn Novas, MD sent at 04/17/2023 10:04 AM EDT ----- I would prefer if this stroke patient will follow-up with Ihor Austin after 60- 90 days of CPAP use and also to follow-up on his vascular dementia.  As stated before, the primary neurologist is Dr. Pearlean Brownie.  I replaced the old CPAP machine with a new one at the same settings.

## 2023-04-18 NOTE — Telephone Encounter (Signed)
I called pt's wife, Justin [name]. I advised pt that Dr. Vickey Huger reviewed their sleep study results and found that pt is CPAP dependent patient with history of severe OSA . Dr. Vickey Huger recommends that pt continue CPAP on current settings. I reviewed PAP compliance expectations with the pt. Pt is agreeable to starting a CPAP. I advised pt that an order will be sent to a DME, [employer], and [employer] will call the pt within about one week after they file with the pt's insurance. [employer] will show the pt how to use the machine, fit for masks, and troubleshoot the CPAP if needed. A follow up appt was made for insurance purposes with [name], NP on 07/04/2023. Pt verbalized understanding to arrive 15 minutes early and bring their CPAP. Pt verbalized understanding of results. Pt had no questions at this time but was encouraged to call back if questions arise. I have sent the order to [employer].

## 2023-05-03 DIAGNOSIS — G4733 Obstructive sleep apnea (adult) (pediatric): Secondary | ICD-10-CM | POA: Diagnosis not present

## 2023-05-10 DIAGNOSIS — K5100 Ulcerative (chronic) pancolitis without complications: Secondary | ICD-10-CM | POA: Diagnosis not present

## 2023-05-10 DIAGNOSIS — I10 Essential (primary) hypertension: Secondary | ICD-10-CM | POA: Diagnosis not present

## 2023-05-12 ENCOUNTER — Ambulatory Visit: Payer: Medicare PPO | Admitting: Family Medicine

## 2023-05-12 ENCOUNTER — Encounter: Payer: Self-pay | Admitting: Family Medicine

## 2023-05-12 VITALS — BP 130/72 | HR 84 | Temp 98.1°F | Ht 73.0 in | Wt 170.0 lb

## 2023-05-12 DIAGNOSIS — J069 Acute upper respiratory infection, unspecified: Secondary | ICD-10-CM

## 2023-05-12 NOTE — Assessment & Plan Note (Addendum)
Reassured patient that symptoms and exam findings are most consistent with a viral upper respiratory infection and explained lack of efficacy of antibiotics against viruses.  Discussed expected course and features suggestive of secondary bacterial infection.  Continue supportive care. Increase fluid intake with water or electrolyte solution like pedialyte. Encouraged acetaminophen as needed for fever/pain. Encouraged salt water gargling, chloraseptic spray and throat lozenges. Encouraged OTC guaifenesin. Encouraged saline sinus flushes and/or neti with humidified air. May try a daily Zyrtec as well to help manage any allergies.

## 2023-05-12 NOTE — Patient Instructions (Signed)
Your symptoms and exam findings are most consistent with a viral upper respiratory infection. These usually run their course in 5-7 days. Unfortunately, antibiotics don't work against viruses and just increase your risk of other issues such as diarrhea, yeast infections, and resistant infections.  If your symptoms last longer than 10 days and/or you start feeling worse with facial pain, high fever, cough, shortness of breath or start feeling significantly worse, please call us right away to be further evaluated.  Some things that can make you feel better are: - Increased rest - Increasing fluid with water/sugar free electrolytes - Acetaminophen as needed for fever/pain.  - Salt water gargling, chloraseptic spray and throat lozenges - OTC guaifenesin (Mucinex).  - Saline sinus flushes or a neti pot.  - Humidifying the air.   Can try Zyrtec daily as well to manage allergies

## 2023-05-12 NOTE — Progress Notes (Signed)
Subjective:  HPI: Justin [name] is a 67 y.o. male presenting on 05/12/2023 for Acute Visit (? upper respiratory infection, sneezing/slj/)   HPI Patient is in today for 3 days of dry cough, sneezing, runny nose, eye watering. Denies sinus pressure, fever, wheezing, shortness of breath, sore throat. No known sick exposures, no home covid testing. Has tried Nyquil PM.  Review of Systems  All other systems reviewed and are negative.   Relevant past medical history reviewed and updated as indicated.   Past Medical History:  Diagnosis Date   Acid reflux 04/27/2021   Acute metabolic encephalopathy 04/30/2021   Anxiety    Arthritis    Cerebral embolism with cerebral infarction 04/15/2018   Chest pain 05/19/2015   Depression    EEG abnormality without seizure 05/19/2021   Encephalitis 04/30/2021   Encephalopathy due to COVID-19 virus    Episodic confusion 05/19/2021   Heart murmur    Hyperglycemia 05/19/2015   Hypertension    Left thalamic lacunar infarct 05/24/2018   OSA (obstructive sleep apnea)    cpap 9 cm H2O   Recurrent unilateral inguinal hernia with obstruction and without gangrene    SBO (small bowel obstruction)    Sleep apnea 3 years ago   Ulcerative colitis 09/24/2019   Vascular dementia without behavioral disturbance 05/19/2021     Past Surgical History:  Procedure Laterality Date   BIOPSY  06/21/2017   Procedure: BIOPSY;  Surgeon: Malissa Hippo, MD;  [facility];  Service: Endoscopy;;  rectal, righta and left colon;   COLONOSCOPY N/A 06/21/2017   Procedure: COLONOSCOPY;  Surgeon: Malissa Hippo, MD;  [facility];  Service: Endoscopy;  Laterality: N/A;  1:25   COLONOSCOPY WITH PROPOFOL N/A 08/31/2022   Procedure: COLONOSCOPY WITH PROPOFOL;  Surgeon: Dolores Frame, MD;  [facility];  Service: Gastroenterology;  Laterality: N/A;  1245 ASA 2   HERNIA REPAIR  3 years ago   INGUINAL HERNIA REPAIR Right 04/04/2018    Procedure: HERNIA REPAIR INGUINAL ADULT WITH MESH;  Surgeon: Franky Macho, MD;  Location: [facility];  Service: General;  Laterality: Right;   INGUINAL HERNIA REPAIR Right 01/31/2019   Procedure: RECURRENT RIGHT INGUINAL HERNIORRHAPY WITH MESH, INCARCERATED;  Surgeon: Franky Macho, MD;  Location: [facility];  Service: General;  Laterality: Right;   JOINT REPLACEMENT  July 19, 2021   Total knee repl.   KNEE SURGERY     tore mcl in high school (70's)   TOTAL KNEE ARTHROPLASTY Left 07/19/2021   Procedure: TOTAL KNEE ARTHROPLASTY;  Surgeon: Ollen Gross, MD;  Location: [facility];  Service: Orthopedics;  Laterality: Left;    Allergies and medications reviewed and updated.   Current Outpatient Medications:    Ascorbic Acid (VITAMIN C) 500 MG CAPS, Take 500 mg by mouth daily., Disp: , Rfl:    aspirin EC 81 MG tablet, Take 81 mg by mouth daily. Swallow whole., Disp: , Rfl:    atorvastatin (LIPITOR) 40 MG tablet, TAKE ONE TABLET BY MOUTH AT 6PM, Disp: 90 tablet, Rfl: 1   CVS B-12 500 MCG tablet, Take 500 mcg by mouth daily., Disp: , Rfl:    diphenhydrAMINE (BENADRYL) 25 MG tablet, Take 25 mg by mouth daily as needed for allergies., Disp: , Rfl:    donepezil (ARICEPT) 10 MG tablet, Take 1 tablet (10 mg total) by mouth at bedtime., Disp: 90 tablet, Rfl: 1   folic acid (FOLVITE) 1 MG tablet, TAKE 1 TABLET BY MOUTH  EVERY DAY, Disp: 90 tablet, Rfl: 3   losartan (COZAAR) 100 MG tablet, TAKE 1 TABLET BY MOUTH EVERY DAY, Disp: 90 tablet, Rfl: 0   pantoprazole (PROTONIX) 40 MG tablet, Take 1 tablet (40 mg total) by mouth daily., Disp: 90 tablet, Rfl: 1   sulfaSALAzine (AZULFIDINE) 500 MG tablet, TAKE 2 TABLETS BY MOUTH TWICE A DAY, Disp: 360 tablet, Rfl: 3  No Known Allergies  Objective:   BP 130/72   Pulse 84   Temp 98.1 F (36.7 C) (Oral)   Ht 6\' 1"  (1.854 m)   Wt 170 lb (77.1 kg)   SpO2 94%   BMI 22.43 kg/m      05/12/2023   10:21 AM 03/08/2023   12:32 PM 01/16/2023    2:50 PM  Vitals with  BMI  Height 6\' 1"  6\' 0"    Weight 170 lbs 179 lbs   BMI 22.43 24.27   Systolic 130 154 960  Diastolic 72 83 90  Pulse 84 64 60     Physical Exam Vitals and nursing note reviewed.  Constitutional:      Appearance: Normal appearance. He is normal weight.  HENT:     Head: Normocephalic and atraumatic.     Right Ear: Ear canal and external ear normal. There is impacted cerumen.     Left Ear: Tympanic membrane, ear canal and external ear normal.     Nose: Rhinorrhea (clear) present.     Mouth/Throat:     Mouth: Mucous membranes are moist.     Pharynx: Oropharynx is clear.  Eyes:     General:        Right eye: Discharge (clear) present.     Conjunctiva/sclera: Conjunctivae normal.  Cardiovascular:     Rate and Rhythm: Normal rate. Rhythm irregular.     Pulses: Normal pulses.     Heart sounds: Normal heart sounds.  Pulmonary:     Effort: Pulmonary effort is normal.     Breath sounds: Normal breath sounds.  Musculoskeletal:     Cervical back: No tenderness.  Lymphadenopathy:     Cervical: No cervical adenopathy.  Skin:    General: Skin is warm and dry.     Capillary Refill: Capillary refill takes less than 2 seconds.  Neurological:     General: No focal deficit present.     Mental Status: He is alert and oriented to person, place, and time. Mental status is at baseline.  Psychiatric:        Mood and Affect: Mood normal.        Behavior: Behavior normal.        Thought Content: Thought content normal.        Judgment: Judgment normal.     Assessment & Plan:  Viral URI with cough Assessment & Plan: Reassured patient that symptoms and exam findings are most consistent with a viral upper respiratory infection and explained lack of efficacy of antibiotics against viruses.  Discussed expected course and features suggestive of secondary bacterial infection.  Continue supportive care. Increase fluid intake with water or electrolyte solution like pedialyte. Encouraged  acetaminophen as needed for fever/pain. Encouraged salt water gargling, chloraseptic spray and throat lozenges. Encouraged OTC guaifenesin. Encouraged saline sinus flushes and/or neti with humidified air. May try a daily Zyrtec as well to help manage any allergies.       Follow up plan: No follow-ups on file.  Park Meo, FNP

## 2023-05-18 ENCOUNTER — Ambulatory Visit (INDEPENDENT_AMBULATORY_CARE_PROVIDER_SITE_OTHER): Payer: Medicare PPO | Admitting: Gastroenterology

## 2023-05-18 ENCOUNTER — Encounter (INDEPENDENT_AMBULATORY_CARE_PROVIDER_SITE_OTHER): Payer: Self-pay | Admitting: Gastroenterology

## 2023-05-18 VITALS — BP 158/83 | HR 71 | Temp 98.1°F | Ht 73.0 in | Wt 171.0 lb

## 2023-05-18 DIAGNOSIS — K5100 Ulcerative (chronic) pancolitis without complications: Secondary | ICD-10-CM

## 2023-05-18 DIAGNOSIS — K219 Gastro-esophageal reflux disease without esophagitis: Secondary | ICD-10-CM

## 2023-05-18 NOTE — Progress Notes (Addendum)
Referring Provider: Donita Brooks, MD Primary Care Physician:  Donita Brooks, MD Primary GI Physician: Dr. Levon Hedger   Chief Complaint  Patient presents with   [facility] pancolitis    Follow up pancolitis. Having more frequent trips to bathroom.    Gastroesophageal Reflux    Follow up on GERD    HPI:   Justin [name] is a 67 y.o. male with past medical history of ulcerative pancolitis, history of stroke, GERD, vascular dementia, hypertension and hyperlipidemia   Patient presenting today for follow up of ulcerative pancolitis and GERD   Last seen March 2024, at that time noticed for the last 2 weeks he is having more frequent BMs recently, as he used to have only one BM per day and is now having 2-3 Bms per day, usually after meal. Stools range from hard to loose.   Recommended to have C diff and GI pathogen panel, fecal calprotectin, continue Sulfasalazine 1g BID, continue folic acid daily.  C diff and GI path in March were negative, Calprotectin was elevated at 229, advised to repeat in 3 months   Other labs in march with normal CMP and CBC TSH in August 2023 was 2.1  Present:  Patient arrives with his wife who helps provide history given patient's history of dementia. States aricept was recently increased last month to 10mg  from 5mg . She notes he goes to the restroom somewhat frequently though she is unsure if he is urinating these times or having BMs, she notes that he gets up some during the night, occasionally will not flush and she has seen some stools in the toilet, other times has seen urine in the toilet. Reporting 2-3 bathroom visits during the day and sometimes 2-3 at night, again unsure if he is urinating or defecating during these.  No fecal incontinence. The patient denies any abdominal pain and reports that stools are formed. He denies any diarrhea and wife has not seen any diarrhea. Patient is unsure how often he is defecating. Wife feels that patient's appetite  is steady. No nausea or vomiting. He denies any issues with his GERD. No recent labs with PCP.    Last Colonoscopy:08/31/2022 Diverticulosis. Mayo 0 throughout the colon,  in remission.  Last Endoscopy:  Recommendations:  Repeat colonoscopy advised in 3 years.   Past Medical History:  Diagnosis Date   Acid reflux 04/27/2021   Acute metabolic encephalopathy 04/30/2021   Anxiety    Arthritis    Cerebral embolism with cerebral infarction 04/15/2018   Chest pain 05/19/2015   Depression    EEG abnormality without seizure 05/19/2021   Encephalitis 04/30/2021   Encephalopathy due to COVID-19 virus    Episodic confusion 05/19/2021   Heart murmur    Hyperglycemia 05/19/2015   Hypertension    Left thalamic lacunar infarct 05/24/2018   OSA (obstructive sleep apnea)    cpap 9 cm H2O   Recurrent unilateral inguinal hernia with obstruction and without gangrene    SBO (small bowel obstruction)    Sleep apnea 3 years ago   Ulcerative colitis 09/24/2019   Vascular dementia without behavioral disturbance 05/19/2021    Past Surgical History:  Procedure Laterality Date   BIOPSY  06/21/2017   Procedure: BIOPSY;  Surgeon: Malissa Hippo, MD;  [facility];  Service: Endoscopy;;  rectal, righta and left colon;   COLONOSCOPY N/A 06/21/2017   Procedure: COLONOSCOPY;  Surgeon: Malissa Hippo, MD;  [facility];  Service: Endoscopy;  Laterality: N/A;  1:25   COLONOSCOPY WITH PROPOFOL N/A 08/31/2022   Procedure: COLONOSCOPY WITH PROPOFOL;  Surgeon: Dolores Frame, MD;  [facility];  Service: Gastroenterology;  Laterality: N/A;  1245 ASA 2   HERNIA REPAIR  3 years ago   INGUINAL HERNIA REPAIR Right 04/04/2018   Procedure: HERNIA REPAIR INGUINAL ADULT WITH MESH;  Surgeon: Franky Macho, MD;  [facility];  Service: General;  Laterality: Right;   INGUINAL HERNIA REPAIR Right 01/31/2019   Procedure: RECURRENT RIGHT INGUINAL HERNIORRHAPY WITH MESH,  INCARCERATED;  Surgeon: Franky Macho, MD;  Location: [facility];  Service: General;  Laterality: Right;   JOINT REPLACEMENT  July 19, 2021   Total knee repl.   KNEE SURGERY     tore mcl in high school (70's)   TOTAL KNEE ARTHROPLASTY Left 07/19/2021   Procedure: TOTAL KNEE ARTHROPLASTY;  Surgeon: Ollen Gross, MD;  [facility]: [facility];  Service: Orthopedics;  Laterality: Left;    Current Outpatient Medications  Medication Sig Dispense Refill   Ascorbic Acid (VITAMIN C) 500 MG CAPS Take 500 mg by mouth daily.     aspirin EC 81 MG tablet Take 81 mg by mouth daily. Swallow whole.     atorvastatin (LIPITOR) 40 MG tablet TAKE ONE TABLET BY MOUTH AT 6PM 90 tablet 1   CVS B-12 500 MCG tablet Take 500 mcg by mouth daily.     diphenhydrAMINE (BENADRYL) 25 MG tablet Take 25 mg by mouth daily as needed for allergies.     donepezil (ARICEPT) 10 MG tablet Take 1 tablet (10 mg total) by mouth at bedtime. 90 tablet 1   folic acid (FOLVITE) 1 MG tablet TAKE 1 TABLET BY MOUTH EVERY DAY 90 tablet 3   losartan (COZAAR) 100 MG tablet TAKE 1 TABLET BY MOUTH EVERY DAY 90 tablet 0   pantoprazole (PROTONIX) 40 MG tablet Take 1 tablet (40 mg total) by mouth daily. 90 tablet 1   sulfaSALAzine (AZULFIDINE) 500 MG tablet TAKE 2 TABLETS BY MOUTH TWICE A DAY 360 tablet 3   No current facility-administered medications for this visit.    Allergies as of 05/18/2023   (No Known Allergies)    Family History  Problem Relation Age of Onset   Tuberculosis Mother    Stroke Mother    Arthritis Mother    Cancer Mother    Heart disease Father        died at 4   Early death Father    Bradycardia Brother        pacemaker   Stroke Brother    Hypertension Brother    Mental illness Paternal Grandfather        suicide at 18   Suicidality Paternal Grandfather    Cancer Paternal Uncle    Dementia Maternal Uncle 61   Colon cancer Neg Hx     Social History   Socioeconomic History   Marital status: Married     Spouse name: Burna Mortimer   Number of children: Not on file   Years of education: 12   Highest education level: High school graduate  Occupational History   Occupation: Unemployed/retired    Comment: Personnel officer  Tobacco Use   Smoking status: Never    Passive exposure: Past   Smokeless tobacco: Never  Vaping Use   Vaping status: Never Used  Substance and Sexual Activity   Alcohol use: No   Drug use: No   Sexual activity: Yes    Birth control/protection: None  Other Topics Concern  Not on file  Social History Narrative   Lives with wife   Social Determinants of Health   Financial Resource Strain: Low Risk  (08/25/2022)   Overall Financial Resource Strain (CARDIA)    Difficulty of Paying Living Expenses: Not hard at all  Food Insecurity: No Food Insecurity (08/25/2022)   Hunger Vital Sign    Worried About Running Out of Food in the Last Year: Never true    Ran Out of Food in the Last Year: Never true  Transportation Needs: No Transportation Needs (08/25/2022)   PRAPARE - Administrator, Civil Service (Medical): No    Lack of Transportation (Non-Medical): No  Physical Activity: Sufficiently Active (08/25/2022)   Exercise Vital Sign    Days of Exercise per Week: 7 days    Minutes of Exercise per Session: 30 min  Stress: No Stress Concern Present (08/25/2022)   [employer] of Occupational Health - Occupational Stress Questionnaire    Feeling of Stress : Only a little  Social Connections: Moderately Integrated (08/25/2022)   Social Connection and Isolation Panel [NHANES]    Frequency of Communication with Friends and Family: Once a week    Frequency of Social Gatherings with Friends and Family: Once a week    Attends Religious Services: 1 to 4 times per year    Active Member of [employer] or Organizations: Yes    Attends Banker Meetings: 1 to 4 times per year    Marital Status: Married    Review of systems General: negative for malaise, night  sweats, fever, chills, weight loss Neck: Negative for lumps, goiter, pain and significant neck swelling Resp: Negative for cough, wheezing, dyspnea at rest CV: Negative for chest pain, leg swelling, palpitations, orthopnea GI: denies melena, hematochezia, nausea, vomiting, diarrhea, constipation, dysphagia, odyonophagia, early satiety or unintentional weight loss.  MSK: Negative for joint pain or swelling, back pain, and muscle pain. Derm: Negative for itching or rash Psych: Denies depression, anxiety, memory loss, confusion. No homicidal or suicidal ideation.  Heme: Negative for prolonged bleeding, bruising easily, and swollen nodes. Endocrine: Negative for cold or heat intolerance, polyuria, polydipsia and goiter. Neuro: negative for tremor, gait imbalance, syncope and seizures. The remainder of the review of systems is noncontributory.  Physical Exam: BP (!) 158/83   Pulse 71   Temp 98.1 F (36.7 C) (Oral)   Ht 6\' 1"  (1.854 m)   Wt 171 lb (77.6 kg)   BMI 22.56 kg/m  General:   Alert and oriented. No distress noted. Pleasant and cooperative.  Head:  Normocephalic and atraumatic. Eyes:  Conjuctiva clear without scleral icterus. Mouth:  Oral mucosa pink and moist. Good dentition. No lesions. Heart: Normal rate and rhythm, s1 and s2 heart sounds present.  Lungs: Clear lung sounds in all lobes. Respirations equal and unlabored. Abdomen:  +BS, soft, non-tender and non-distended. No rebound or guarding. No HSM or masses noted. Derm: No palmar erythema or jaundice Msk:  Symmetrical without gross deformities. Normal posture. Extremities:  Without edema. Neurologic:  Alert and  oriented x4 Psych:  Alert and cooperative. Normal mood and affect.  Invalid input(s): "6 MONTHS"   ASSESSMENT: Justin [name] is a 67 y.o. male presenting today for follow up of ulcerative pancolitis and GERD  GERD:  symptoms well managed on protonix 40mg  daily. Patient denies any GERD symptoms, wife reports  appetite is stable. He has not reported any heartburn, nausea or vomiting to her. Will continue with current regimen.   Ulcerative  pancolitis: last TCS in October 2023 with endoscopic remission, he has been maintained on sulfasalazine 1g BID. At last visit in march having more diarrhea, stool studies at that time with fecal calprotectin elevated at 229,negative infection. advised to repeat calprotectin again in 3 months.  Patient submitted fecal calprotectin to the lab last week however we do not have these results back yet.   He denies any diarrhea and reports that stools are solid, his wife has not noticed any diarrhea at times patient has failed to flush stools after he has gone nor any episodes of fecal incontinence from the patient.  He denies any abdominal pain.  Wife reports he uses the restroom 2-3 times per day and sometimes 2-3 times at night however she is unsure what portion of these restroom visits are bowel movements and what portion are urination.  Given history of dementia, the patient is unable to provide good history regarding his number of bowel movements per day.  For now we will await repeat fecal calprotectin, continue with sulfasalazine 1 g twice daily.  I did discuss with patient and his wife it would be important to try and keep track of how many bowel movements per day he is having if at all possible.  Further recommendations to follow once fecal calprotectin has resulted.   PLAN:  Continue with protonix 40mg  daily  2. Continue sulfasalazine 1000mg  BID  3. Continue with folic acid daily 4. Try to track how many BMs per day patient is having 5. Will follow for recent Fecal calprotectin patient submitted to the lab 6. Will update CMP, CBC, CRP today  All questions were answered, patient verbalized understanding and is in agreement with plan as outlined above.   Follow Up: 4 months   Darnetta Kesselman L. Jeanmarie Hubert, MSN, APRN, AGNP-C Adult-Gerontology Nurse Practitioner [facility]  I have reviewed the note and agree with the APP's assessment as described in this progress note  May consider increasing sulfasalazine to 2 g BID if calprotectin is persistently elevated. If despite this he is symptomatic, may repeat a flexible sigmoidoscopy.  Katrinka Blazing, MD Gastroenterology and Hepatology [facility]

## 2023-05-18 NOTE — Patient Instructions (Addendum)
Continue with folic acid daily Continue sulfasalazine 1000mg  twice daily I will update some basic labs today and be in touch once stool sample result is back  If at all possible, it would be helpful to keep track of how many bowel movements you are having per day.  Follow up 4 months  It was a pleasure to see you today. I want to create trusting relationships with patients and provide genuine, compassionate, and quality care. I truly value your feedback! please be on the lookout for a survey regarding your visit with me today. I appreciate your input about our visit and your time in completing this!    Uilani Sanville L. Jeanmarie Hubert, MSN, APRN, AGNP-C Adult-Gerontology Nurse Practitioner [facility] at [facility]

## 2023-05-19 LAB — COMPREHENSIVE METABOLIC PANEL
AG Ratio: 1.8 (calc) (ref 1.0–2.5)
ALT: 21 U/L (ref 9–46)
AST: 24 U/L (ref 10–35)
Albumin: 4.2 g/dL (ref 3.6–5.1)
Alkaline phosphatase (APISO): 84 U/L (ref 35–144)
BUN: 19 mg/dL (ref 7–25)
CO2: 27 mmol/L (ref 20–32)
Calcium: 9.6 mg/dL (ref 8.6–10.3)
Chloride: 106 mmol/L (ref 98–110)
Creat: 1.12 mg/dL (ref 0.70–1.35)
Globulin: 2.4 g/dL (calc) (ref 1.9–3.7)
Glucose, Bld: 98 mg/dL (ref 65–99)
Potassium: 3.9 mmol/L (ref 3.5–5.3)
Sodium: 140 mmol/L (ref 135–146)
Total Bilirubin: 0.8 mg/dL (ref 0.2–1.2)
Total Protein: 6.6 g/dL (ref 6.1–8.1)

## 2023-05-19 LAB — CBC WITH DIFFERENTIAL/PLATELET
Absolute Monocytes: 744 cells/uL (ref 200–950)
Basophils Absolute: 47 cells/uL (ref 0–200)
Basophils Relative: 0.7 %
Eosinophils Absolute: 67 cells/uL (ref 15–500)
Eosinophils Relative: 1 %
HCT: 41.3 % (ref 38.5–50.0)
Hemoglobin: 14 g/dL (ref 13.2–17.1)
Lymphs Abs: 1166 cells/uL (ref 850–3900)
MCH: 33.7 pg — ABNORMAL HIGH (ref 27.0–33.0)
MCHC: 33.9 g/dL (ref 32.0–36.0)
MCV: 99.5 fL (ref 80.0–100.0)
MPV: 9.2 fL (ref 7.5–12.5)
Monocytes Relative: 11.1 %
Neutro Abs: 4677 cells/uL (ref 1500–7800)
Neutrophils Relative %: 69.8 %
Platelets: 323 10*3/uL (ref 140–400)
RBC: 4.15 10*6/uL — ABNORMAL LOW (ref 4.20–5.80)
RDW: 11.5 % (ref 11.0–15.0)
Total Lymphocyte: 17.4 %
WBC: 6.7 10*3/uL (ref 3.8–10.8)

## 2023-05-19 LAB — C-REACTIVE PROTEIN: CRP: <3 mg/L (ref <8.0)

## 2023-05-20 LAB — CALPROTECTIN: Calprotectin: 86 mcg/g

## 2023-06-02 DIAGNOSIS — G4733 Obstructive sleep apnea (adult) (pediatric): Secondary | ICD-10-CM | POA: Diagnosis not present

## 2023-06-05 ENCOUNTER — Other Ambulatory Visit: Payer: Self-pay | Admitting: Family Medicine

## 2023-06-12 ENCOUNTER — Ambulatory Visit: Payer: Medicare PPO | Admitting: Family Medicine

## 2023-06-12 VITALS — BP 126/72 | HR 50 | Temp 97.7°F | Ht 73.0 in | Wt 171.6 lb

## 2023-06-12 DIAGNOSIS — F0150 Vascular dementia without behavioral disturbance: Secondary | ICD-10-CM

## 2023-06-12 DIAGNOSIS — I10 Essential (primary) hypertension: Secondary | ICD-10-CM | POA: Diagnosis not present

## 2023-06-12 DIAGNOSIS — I499 Cardiac arrhythmia, unspecified: Secondary | ICD-10-CM | POA: Diagnosis not present

## 2023-06-12 LAB — CBC WITH DIFFERENTIAL/PLATELET
Absolute Monocytes: 865 cells/uL (ref 200–950)
Basophils Absolute: 39 cells/uL (ref 0–200)
Basophils Relative: 0.6 %
Eosinophils Absolute: 137 cells/uL (ref 15–500)
Eosinophils Relative: 2.1 %
HCT: 42.8 % (ref 38.5–50.0)
Hemoglobin: 14.8 g/dL (ref 13.2–17.1)
Lymphs Abs: 1300 cells/uL (ref 850–3900)
MCH: 34.2 pg — ABNORMAL HIGH (ref 27.0–33.0)
MCHC: 34.6 g/dL (ref 32.0–36.0)
MCV: 98.8 fL (ref 80.0–100.0)
MPV: 9.5 fL (ref 7.5–12.5)
Monocytes Relative: 13.3 %
Neutro Abs: 4160 cells/uL (ref 1500–7800)
Neutrophils Relative %: 64 %
Platelets: 282 10*3/uL (ref 140–400)
RBC: 4.33 10*6/uL (ref 4.20–5.80)
RDW: 11.7 % (ref 11.0–15.0)
Total Lymphocyte: 20 %
WBC: 6.5 10*3/uL (ref 3.8–10.8)

## 2023-06-12 MED ORDER — LOSARTAN POTASSIUM 100 MG PO TABS: ORAL_TABLET | ORAL | 3 refills | Status: DC

## 2023-06-12 NOTE — Progress Notes (Signed)
Subjective:    Patient ID: Justin [name], male    DOB: 1956-09-03, 67 y.o.   MRN: [id number]  HPI Wt Readings from Last 3 Encounters:  06/12/23 171 lb 9.6 oz (77.8 kg)  05/18/23 171 lb (77.6 kg)  05/12/23 170 lb (77.1 kg)  Patient presented today for normal checkup.  However on his physical exam, he was found to have an irregularly irregular heartbeat.  He denies any chest pain shortness of breath or dyspnea on exertion.  He denies any strokelike symptoms.  The patient has a history of dementia.  Dementia is slowly progressing.  The patient has a difficult time answering questions for me today.  He simply defers to his wife to answer the majority of questions for him. Past Medical History:  Diagnosis Date   Acid reflux 04/27/2021   Acute metabolic encephalopathy 04/30/2021   Anxiety    Arthritis    Cerebral embolism with cerebral infarction 04/15/2018   Chest pain 05/19/2015   Depression    EEG abnormality without seizure 05/19/2021   Encephalitis 04/30/2021   Encephalopathy due to COVID-19 virus    Episodic confusion 05/19/2021   Heart murmur    Hyperglycemia 05/19/2015   Hypertension    Left thalamic lacunar infarct 05/24/2018   OSA (obstructive sleep apnea)    cpap 9 cm H2O   Recurrent unilateral inguinal hernia with obstruction and without gangrene    SBO (small bowel obstruction)    Sleep apnea 3 years ago   Ulcerative colitis 09/24/2019   Vascular dementia without behavioral disturbance 05/19/2021   Past Surgical History:  Procedure Laterality Date   BIOPSY  06/21/2017   Procedure: BIOPSY;  Surgeon: Malissa Hippo, MD;  Location: [facility];  Service: Endoscopy;;  rectal, righta and left colon;   COLONOSCOPY N/A 06/21/2017   Procedure: COLONOSCOPY;  Surgeon: Malissa Hippo, MD;  Location: [facility];  Service: Endoscopy;  Laterality: N/A;  1:25   COLONOSCOPY WITH PROPOFOL N/A 08/31/2022   Procedure: COLONOSCOPY WITH PROPOFOL;  Surgeon: Dolores Frame, MD;  Location: [facility];  Service: Gastroenterology;  Laterality: N/A;  1245 ASA 2   HERNIA REPAIR  3 years ago   INGUINAL HERNIA REPAIR Right 04/04/2018   Procedure: HERNIA REPAIR INGUINAL ADULT WITH MESH;  Surgeon: Franky Macho, MD;  Location: [facility];  Service: General;  Laterality: Right;   INGUINAL HERNIA REPAIR Right 01/31/2019   Procedure: RECURRENT RIGHT INGUINAL HERNIORRHAPY WITH MESH, INCARCERATED;  Surgeon: Franky Macho, MD;  Location: [facility];  Service: General;  Laterality: Right;   JOINT REPLACEMENT  July 19, 2021   Total knee repl.   KNEE SURGERY     tore mcl in high school (70's)   TOTAL KNEE ARTHROPLASTY Left 07/19/2021   Procedure: TOTAL KNEE ARTHROPLASTY;  Surgeon: Ollen Gross, MD;  Location: [facility];  Service: Orthopedics;  Laterality: Left;   Current Outpatient Medications on File Prior to Visit  Medication Sig Dispense Refill   Ascorbic Acid (VITAMIN C) 500 MG CAPS Take 500 mg by mouth daily.     aspirin EC 81 MG tablet Take 81 mg by mouth daily. Swallow whole.     atorvastatin (LIPITOR) 40 MG tablet TAKE ONE TABLET BY MOUTH AT 6PM 90 tablet 1   [employer] B-12 500 MCG tablet Take 500 mcg by mouth daily.     diphenhydrAMINE (BENADRYL) 25 MG tablet Take 25 mg by mouth daily as needed for allergies.     donepezil (ARICEPT)  10 MG tablet Take 1 tablet (10 mg total) by mouth at bedtime. 90 tablet 1   folic acid (FOLVITE) 1 MG tablet TAKE 1 TABLET BY MOUTH EVERY DAY 90 tablet 3   pantoprazole (PROTONIX) 40 MG tablet Take 1 tablet (40 mg total) by mouth daily. 90 tablet 1   sulfaSALAzine (AZULFIDINE) 500 MG tablet TAKE 2 TABLETS BY MOUTH TWICE A DAY 360 tablet 3   No current facility-administered medications on file prior to visit.   No Known Allergies Social History   Socioeconomic History   Marital status: Married    Spouse name: Burna Mortimer   Number of children: Not on file   Years of education: 12   Highest education level: 12th grade  Occupational  History   Occupation: Unemployed/retired    Comment: Personnel officer  Tobacco Use   Smoking status: Never    Passive exposure: Past   Smokeless tobacco: Never  Vaping Use   Vaping status: Never Used  Substance and Sexual Activity   Alcohol use: No   Drug use: No   Sexual activity: Yes    Birth control/protection: None  Other Topics Concern   Not on file  Social History Narrative   Lives with wife   Social Determinants of Health   Financial Resource Strain: Low Risk  (06/12/2023)   Overall Financial Resource Strain (CARDIA)    Difficulty of Paying Living Expenses: Not hard at all  Food Insecurity: No Food Insecurity (06/12/2023)   Hunger Vital Sign    Worried About Running Out of Food in the Last Year: Never true    Ran Out of Food in the Last Year: Never true  Transportation Needs: No Transportation Needs (06/12/2023)   PRAPARE - Administrator, Civil Service (Medical): No    Lack of Transportation (Non-Medical): No  Physical Activity: Inactive (06/12/2023)   Exercise Vital Sign    Days of Exercise per Week: 0 days    Minutes of Exercise per Session: 30 min  Stress: No Stress Concern Present (06/12/2023)   [employer] of Occupational Health - Occupational Stress Questionnaire    Feeling of Stress : Not at all  Social Connections: Moderately Integrated (06/12/2023)   Social Connection and Isolation Panel [NHANES]    Frequency of Communication with Friends and Family: Never    Frequency of Social Gatherings with Friends and Family: Twice a week    Attends Religious Services: More than 4 times per year    Active Member of [employer] or Organizations: Yes    Attends Banker Meetings: More than 4 times per year    Marital Status: Married  Catering manager Violence: Not At Risk (08/25/2022)   Humiliation, Afraid, Rape, and Kick questionnaire    Fear of Current or Ex-Partner: No    Emotionally Abused: No    Physically Abused: No    Sexually Abused: No     Review of Systems     Objective:   Physical Exam Vitals reviewed.  Constitutional:      Appearance: Normal appearance. He is normal weight.  Cardiovascular:     Rate and Rhythm: Normal rate. Rhythm irregular.     Heart sounds: Normal heart sounds. No murmur heard.    No friction rub. No gallop.  Pulmonary:     Effort: Pulmonary effort is normal. No respiratory distress.     Breath sounds: Normal breath sounds. No stridor.  Abdominal:     General: Bowel sounds are normal. There is no distension.  Palpations: Abdomen is soft.     Tenderness: There is no abdominal tenderness. There is no guarding.  Musculoskeletal:     Right lower leg: No edema.     Left lower leg: No edema.  Skin:    Findings: No erythema or rash.  Neurological:     General: No focal deficit present.     Mental Status: He is alert and oriented to person, place, and time.     Cranial Nerves: No cranial nerve deficit.     Motor: No weakness.     Gait: Gait normal.         Assessment & Plan:  Benign essential HTN - Plan: CBC with Differential/Platelet, COMPLETE METABOLIC PANEL WITH GFR, Lipid panel  Irregular heart rate - Plan: EKG 12-Lead, CBC with Differential/Platelet, COMPLETE METABOLIC PANEL WITH GFR, Lipid panel  Vascular dementia without behavioral disturbance - Plan: CBC with Differential/Platelet, COMPLETE METABOLIC PANEL WITH GFR, Lipid panel Blood pressure today is excellent.  I would like to check a CBC a CMP and a lipid panel and I like to keep his LDL cholesterol well below 70 given his history of stroke.  Given his history of an irregular heartbeat I performed an EKG.  Thankfully EKG shows normal sinus rhythm with frequent PACs but no evidence of atrial fibrillation

## 2023-06-29 ENCOUNTER — Encounter: Payer: Self-pay | Admitting: Adult Health

## 2023-07-03 DIAGNOSIS — G4733 Obstructive sleep apnea (adult) (pediatric): Secondary | ICD-10-CM | POA: Diagnosis not present

## 2023-07-04 ENCOUNTER — Encounter: Payer: Self-pay | Admitting: Adult Health

## 2023-07-04 ENCOUNTER — Ambulatory Visit: Payer: Medicare PPO | Admitting: Adult Health

## 2023-07-04 VITALS — BP 121/74 | HR 65 | Ht 73.0 in | Wt 168.0 lb

## 2023-07-04 DIAGNOSIS — Z8673 Personal history of transient ischemic attack (TIA), and cerebral infarction without residual deficits: Secondary | ICD-10-CM

## 2023-07-04 DIAGNOSIS — R4189 Other symptoms and signs involving cognitive functions and awareness: Secondary | ICD-10-CM | POA: Diagnosis not present

## 2023-07-04 DIAGNOSIS — G4733 Obstructive sleep apnea (adult) (pediatric): Secondary | ICD-10-CM | POA: Diagnosis not present

## 2023-07-04 DIAGNOSIS — F01B3 Vascular dementia, moderate, with mood disturbance: Secondary | ICD-10-CM

## 2023-07-04 NOTE — Patient Instructions (Addendum)
Your Plan:  Continue nightly use of CPAP for sleep apnea management  Recommend decreasing Aricept back to 5mg  nightly to see if confusion and behaviors improve  Can consider trying Namenda in the future which can help slow further cognitive decline and at times help with agitations   You will be called to schedule an Mri brain to look for other causes of further memory decline     Follow up in 6 months or call earlier if needed      Thank you for coming to see [place] at [facility]. I hope we have been able to provide you high quality care today.  You may receive a patient satisfaction survey over the next few weeks. We would appreciate your feedback and comments so that we may continue to improve ourselves and the health of our patients.

## 2023-07-04 NOTE — Progress Notes (Signed)
Provider:  Melvyn Novas, [name]  Primary Care Physician:  Donita Brooks, MD  Reason for visit: Stroke, memory, CPAP   Chief Complaint  Patient presents with   Follow-up    Pt with wife, rm 8. Here today for initial cpap. Overall doing well. DME [facility]. The wife sent a mychart message on 06/29/23 in regard to some changes with memory and cognition.      HPI:   Update 07/04/2023 JM: Patient returns for initial CPAP compliance visit after receiving new machine as well as follow-up on stroke and memory.  Repeated HST 04/12/2023 which confirmed mild to moderate OSA, received new CPAP 05/03/2023.  Compliance report over the past 30 days shows 30 out of 30 usage days with 28 days greater than 4 hours for 93% compliance.  Residual AHI 0.7 on set pressure of 9 and EPR level 3.  Leaks in the 95th percentile 29.8.  Reports tolerating CPAP well. ESS 6/24. DME [facility].   In regards to cognition, wife sent MyChart message prior to visit reporting decline of memory, confusion, increased agitation and frustration.  Became agitated when wife told him he could not drive (has not drove in the past couple of years). MMSE today 14/30 (prior 18/30 03/2023 and 21/30 09/2022).  At prior visit with Dr. Vickey Huger in May, she recommended increasing Aricept dosage due to memory decline, currently taking 10mg  nightly, but wife questions if this has contributed to worsening behaviors and confusion.  Continues to have increased anxiety with overstimulation. Wife also mentions more recently during conversations, he will speak off topic or say odd words that do not fit with what he is speaking about.  Previously on Lexapro for anxiety/depression but discontinued last year by PCP as patient didn't feel it was needed.   Is any other stroke/TIA symptoms or neurological symptoms.  Compliant on aspirin and atorvastatin.  Routinely follows with PCP for stroke risk factor management.      History provided for  reference purposes only Update 09/14/2022 JM: Patient returns for 21-month follow-up.  He is accompanied by his wife.  Reports cognition has been stable since prior visit per both patient and wife. Can have good days and bad days.  No longer works.  MMSE today 21/30 (prior 18/30). Remains on Aricept.  PCP discontinued Lexapro as no longer having issues with anxiety/depression, has been doing well since stopping.  Denies any new stroke/TIA symptoms. Compliant on aspirin and atorvastatin.  Blood pressure well controlled.  In regards to sleep apnea, review of compliance report shows adequate usage with 29 out of 30 usage days and 23 days greater than 4 hours for 77% compliance.  Optimal residual AHI of 1.9.  Set pressure 9 with EPR level 3.  Leaks in the 95th percentile 15.1.  Continues to tolerate CPAP well.  Can have some nights where he has difficulty sleeping but does not happen routinely.  He is up-to-date with supplies routinely follows with DME company.   Update 03/14/2022 JM: Patient returns for stroke and dementia follow-up after prior visit 09/27/2021.  He is accompanied by his wife.  Vascular dementia:  Hx of stroke:  Per patient Does have issues with short term memory - believes some decline since prior visit Is able to maintain majority ADLs independently but wife does need to pick out clothes as it may take him too long Does have difficulty focusing or may lose train of thought or get distracted easily Reports he is  still working and going to job sites. Can get frustrated due to waiting for materials to come in which can take 2-3 weeks and can hold up completion of job (per wife, not currently working) He tries to stay busy, works around his yard and likes to Warehouse manager prescribing  Per wife can have good days/bad days. At times, very clear and no difficulty but other days will have great difficulty doing simple tasks or constantly repeating questions Will start certain  tasks and will move on to a different task before completing No issues with being distracted or staying focused prior to stroke Will have difficulty finding the correct words or say the incorrect word (at times will realize he used incorrect word) Can have occasional agitation more so when feeling overwhelmed in crowded areas Typically does well if stays with same daily routine Mentions exaggerated laughing such as when watching a TV show that he has seen before. No inappropriate laughing but laughs excessively at things that may not be that funny to others. Denies uncontrollable laughter or any uncontrollable or inappropriate crying. 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 Thankfully, wife was able to find him before doing any work and he was able to be brought home safely. This has not reoccurred since then.  They now have locations set up on cell phone.   MMSE today 18/30 (prior 20/30)  He has remained on Aricept 5 mg daily and Lexapro 20 mg daily.  Denies side effects.  Did have repeat MRI 3/22 after presenting to ED with left facial swelling which was negative for acute findings.  Compliant on aspirin and atorvastatin, denies side effects.  Blood pressure today 141/76.     OSA on CPAP:  Past 30-day compliance report shows 30 out of 30 usage days with 21 days greater than 4 hours for 70% compliance.  Average usage 4 hours and 29 minutes.  Residual AHI 1.6 on set pressure of 9.  Leaks in the 95 percentile 9.9. does have some issues sleeping at times, can become uncomfortable in bed and will move to recliner without his CPAP. If wife wakes up when he is in the recliner, she will wake him up to go back to bed to use CPAP.  He has no complaints regarding CPAP.    Update 09/27/2021 JM:  Returns for 63-month follow-up accompanied by his wife who provides majority of history  Vascular dementia: Stable per wife - can have increased difficulty with overstimulation or with increased anxiety He has not worked since May 2022. He has also refrain from driving - wife questions his ability to return to driving as he is requesting to do so MMSE today 20/30 (prior 16/30 05/2021) -previously completed during f/u visit with Dr. Vickey Huger for hospital follow-up after acute mental status change on 04/30/2021 possibly in setting of encephalopathy due to autoimmune encephalitis vs less likely seizures (see OV note from 05/19/2021). Repeat MRI showed unchanged abnormality in anterior left temporal lobe possibly remote infarct vs encephalitis vs low-grade glioma (per report).  Repeat EEG 06/09/2021 no evidence of seizure activity and slowly discontinue Keppra. Dr. Vickey Huger initiated Aricept 5 mg daily which he has been tolerating without side effects Eval by Dr. Milbert Coulter 06/2021 with likely vascular dementia likely exacerbated by history of COVID-19 and  acute metabolic encephalopathy due to suspected encephalitis. Advised he refrain from driving and working. Recommended repeat eval in 12 months  No behavioral concerns   OSA on CPAP: 30-day compliance report shows 30 out of 30 usage days with only 7 days greater than 4 hours with average usage 3 hours and 0 minutes.  Residual AHI 1.3.  Leaks in the 95th percentile 13.9.  Set pressure 9 and EPR level 3. Continues to tolerate well but will wake up in the middle of the night to either go to the bathroom or to sit in recliner and upon returning back to bed, he will forget to put back on. Up to date on supplies provided through [facility].  Previously discussed inspire device - not a good candidate due to needing anesthesia (per Dr. Vickey Huger).    History of stroke: Stable in regards to stroke aspect.  Denies new stroke/TIA symptoms.  Compliant on aspirin and  atorvastatin -denies side effects.  Blood pressure today 136/74.     Other  S/p left total knee arthroplasty 07/19/2021 - working with PT - has 2 more sessions. Wife mentioned physical therapist recommended cognitive therapy initially but unsure if they still recommend this.  She plans on further discussing at next visit.    No further concerns at this time   Update 03/29/2021 JM:   Justin [name] returns for 25-month stroke and sleep apnea follow-up accompanied by his wife.  Wife sent MyChart message prior to todays visit with concerns regarding continued decline in his cognition especially with short-term memory and processing of information.  Remains on Lexapro for prior increased agitation and irritation.  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  This concerns his wife as she does not feel he should be still working in setting of his continued cognitive decline.  MMSE today 24/30 (prior 22/30).  Of note, he was diagnosed with COVID-19 12/03/2020 and prior COVID 19 06/2020. He did recover well from most recent case and does not believe any worsening of cognition denies residual deficits.  Compliant on aspirin and atorvastatin without associated side effects.  Blood pressure today 111/70.   CPAP compliance report from the past 30 days shows 30 out of 30 usage days but only 15 days greater than 4 hours with average usage 4 hours and 2 minutes.  Residual AHI 0.9.  Per wife, will sleep for a couple hours prior to going to bed in recliner then will sleep in bed for 2-4 hours then go back to his recliner and sleep additional time.  He questions possible use of inspire device  Plans on undergoing knee replacement - sees ortho Thursday to discuss further   No further concerns at this time   Update 09/28/2020 JM: Justin [name] returns for 42-month follow-up regarding history of stroke and cognitive decline.  Since prior  visit, he was diagnosed with COVID 8/30 and wife has noticed slight worsening of cognition since that time but overall stable. MMSE today 22/30 (prior 22/30). MRI, EEG and lab work all unremarkable for reversible causes or abnormality. 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 Wife has been assisting with this. Does report occasional increased agitation or irritation but has been able to  better manage this. Currently on lexapro 20mg  daily. Denies new or recurrent stroke/TIA symptoms remaining on aspirin 325 mg daily and atorvastatin 40 mg daily without side effects.  Blood pressure today 110/68. History of OSA on CPAP with difficulty recently due to possibly walking pneumonia recently completing treatment. Review of compliance report from 08/29/2020 -09/27/2020 shows 29 out of 30 usage days for 97% compliance with only 19 days greater than 4 hours for 63% compliance. Average usage 4 hours and 18 minutes. Residual AHI 2.0 on set pressure 9 cm H2O and EPR level 3. He does report slowly increasing his tolerance as he had difficulty using towards the end of October and beginning of November. Continues to follow with DME company [facility] for any needed supplies. No further concerns.  Update 05/18/2020 JM: Justin [name] returns for follow-up accompanied by his wife. Prior concerns of short-term memory loss gradually worsening since his stroke in 05/2018.  Evidence of depression/anxiety and initiated Lexapro 10 mg daily with improvement.  He does continue to experience occasional agitation/irritability as well as occasional short-term memory deficit as well as difficulty in multitasking and focusing.  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  He also reports intermittent word finding difficulty which has  been more noticeable per wife.  He does report increased fatigue taking naps after work as well as difficulty with insomnia.  Ongoing compliance with CPAP for OSA management -download report told to ensure optimal residual AHI which showed excellent compliance with residual AHI 0.9.  Continues on aspirin 325 mg daily and atorvastatin 40 mg daily for secondary stroke prevention.  Blood pressure today satisfactory 125/77.  No further concerns at this time.  Update 01/14/2020 JM: Justin [name] is a 67 year old male presenting today, 01/14/2020 for a follow up with his wife present. He was last seen on 09/26/2019 for worsening short-term memory loss, which he and his wife feel has improved. He was started on lexapro 10mg  due to concerns of underlying depression/anxiety affecting memory with improvement of underlying depression/anxiety.  Most recent MMSE 26/30 on 12/25/2019 with prior 23/30.  He was previously being followed in this office due to left thalamic stroke in 04/2018 with residual right hemisensory impairment. He has continued on aspirin and atorvastatin for secondary stroke prevention. Overall patient and wife feel like his depression, anxiety and memory impairment have improved.  Recently had follow-up with Maralyn Sago, NP for sleep apnea and endorses ongoing compliance.  Continues on aspirin and atorvastatin for secondary stroke prevention. Blood pressure today satisfactory 125/79. No further concerns at this time.   Update 09/26/2019: Justin [name] is a 67 year old male who is being seen per patient/wife request due to worsening short-term memory loss.  He was previously being followed in this office due to left thalamic stroke in 04/2018 with residual right hemisensory impairment.  He has continued on aspirin and atorvastatin for secondary stroke prevention.  Recent lipid panel showed LDL 61.  Wife has been observing occasional short-term memory concerns over the past few months.  He has continued working and functioning  without difficulty but he will occasionally forget certain conversations, triple check to ensure he is completed different tasks and forgetting certain dates.  He does endorse increased stress over the past few months at work where he has no disease last patient with his workers and increased irritability.  He is also been experiencing worsening insomnia and has been having greater difficulty tolerating CPAP mask.  He does have a prior history of  anxiety.  He was previously treated on Xanax several years prior. MMSE 23/30 greatest in recall, repetition, and drawing.  GAD-7 score 4.  PHQ 9 score 5.  After further discussing possible depression/anxiety causing concerns of memory, wife has noticed patient being more depressed recently.  Reviewing his medication list, he will take Benadryl occasionally as needed to help with sleep.  Interval history 08/23/2018: Patient returns today for stroke follow-up visit.  He did undergo 30-day cardiac monitor which did show episode of V. tach with heart rate 178 but negative for atrial fibrillation.  Upon initial evaluation in the ED, he reported numbness of the right face, right upper extremity and right lower extremity.  Patient does state that he continues to have right upper extremity numbness that is constant with a "stiff and tight feeling".  He has continued to work despite his continued numbness but does have intermittent difficulties as he is right-hand dominant.  He also does have some memory concerns stating that he is not as "sharp" as he previously was and will ask the same questions or will forget certain directions.  He continues to take aspirin without side effects of bleeding or bruising.  Continues to take Lipitor without side effects myalgias.  Blood pressure today satisfactory 135/89.  He does continue to state compliance with CPAP but has been finding himself taking the mask off in the middle the night.  He does have a follow-up in this office for OSA  management on 09/13/2018.  No further concerns at this time.  Denies new or worsening stroke/TIA symptoms.  05/24/2018 visit CD: Seen in STROKE follow up -Justin [name] is a 67 year old male patient whom I had initially seen earlier this year for evaluation of sleep apnea unfortunately the patient underwent a right-sided inguinal hernia repair for which she had to discontinue his full size aspirin 5 days prior.  Also aspirin was removed within 2 or 3 days post surgery he did suffer a stroke.  And his main symptom was right-sided numbness and clumsiness sensory loss of the right dominant hand.  I see him today after he has undergone a stroke work-up in the hospital which included a head CT without contrast on 7 June, an MRI MRA of head and neck, the findings were an acute lacunar infarct in the left lateral thalamus there was no hemorrhage, he had negative abnormalities he was negative for abnormalities in the vascular tree his neck MRA showed mild atherosclerosis at the origins of the left internal carotid artery and right subclavian artery but not significant stenosis and he had very mild for age nonspecific white matter signal changes these are commonly seen the small vessel disease there was no brain atrophy noted.  He had presented to the hospital ED with elevated blood pressures which may explain the lacunar appearance of a previous stroke.  Head CT was negative.  Onset of symptoms was on 13 April 2018 he was evaluated by Dr. Caryl Pina neurologist on-call, who also mentioned that the patient has a history of obstructive sleep apnea and is currently treated at 9 cmH2O pressure.  The patient restarted on aspirin metabolic panels were normal his creatinine was 1.05 his BUN 24 which may indicate that he was slightly dehydrated, his CBC was normal but that the count was 9.6K hemoglobin 15.1 hematocrit 50.1 without any evidence of anemia normal platelet count 350 3K.  Cardiac enzymes were negative his total  cholesterol was actually rather low at 133 ng but is good ""  cholesterol HDL was only 30.      Review of Systems: Out of a complete 14 system review, the patient complains of only the following listed in HPI, and all other reviewed systems are negative.    Social History   Socioeconomic History   Marital status: Married    Spouse name: Burna Mortimer   Number of children: Not on file   Years of education: 12   Highest education level: 12th grade  Occupational History   Occupation: Unemployed/retired    Comment: Personnel officer  Tobacco Use   Smoking status: Never    Passive exposure: Past   Smokeless tobacco: Never  Vaping Use   Vaping status: Never Used  Substance and Sexual Activity   Alcohol use: No   Drug use: No   Sexual activity: Yes    Birth control/protection: None  Other Topics Concern   Not on file  Social History Narrative   Lives with wife   Social Determinants of Health   Financial Resource Strain: Low Risk  (06/12/2023)   Overall Financial Resource Strain (CARDIA)    Difficulty of Paying Living Expenses: Not hard at all  Food Insecurity: No Food Insecurity (06/12/2023)   Hunger Vital Sign    Worried About Running Out of Food in the Last Year: Never true    Ran Out of Food in the Last Year: Never true  Transportation Needs: No Transportation Needs (06/12/2023)   PRAPARE - Administrator, Civil Service (Medical): No    Lack of Transportation (Non-Medical): No  Physical Activity: Inactive (06/12/2023)   Exercise Vital Sign    Days of Exercise per Week: 0 days    Minutes of Exercise per Session: 30 min  Stress: No Stress Concern Present (06/12/2023)   Justin [name] [employer] Occup[employer] Health - Occupational Stress Questionnaire    Feeling of Stress : Not at all  Social Connections: Moderately Integrated (06/12/2023)   Social Connection and Isolation Panel [NHANES]    Frequency of Communication with Friends and Family: Never    Frequency of Social Gatherings  with Friends and Family: Twice a week    Attends Religious Services: More than 4 times per year    Active Member of Clubs or Organizations: Yes    Attends Banker Meetings: More than 4 times per year    Marital Status: Married  Catering manager Violence: Not At Risk (08/25/2022)   Humiliation, Afraid, Rape, and Kick questionnaire    Fear of Current or Ex-Partner: No    Emotionally Abused: No    Physically Abused: No    Sexually Abused: No    Family History  Problem Relation Age of Onset   Tuberculosis Mother    Stroke Mother    Arthritis Mother    Cancer Mother    Heart disease Father        died at 29   Early death Father    Bradycardia Brother        pacemaker   Stroke Brother    Hypertension Brother    Mental illness Paternal Grandfather        suicide at 48   Suicidality Paternal Grandfather    Cancer Paternal Uncle    Dementia Maternal Uncle 55   Colon cancer Neg Hx     Past Medical History:  Diagnosis Date   Acid reflux 04/27/2021   Acute metabolic encephalopathy 04/30/2021   Anxiety    Arthritis    Cerebral embolism with cerebral infarction 04/15/2018  Chest pain 05/19/2015   Depression    EEG abnormality without seizure 05/19/2021   Encephalitis 04/30/2021   Encephalopathy due to COVID-19 virus    Episodic confusion 05/19/2021   Heart murmur    Hyperglycemia 05/19/2015   Hypertension    Left thalamic lacunar infarct 05/24/2018   OSA (obstructive sleep apnea)    cpap 9 cm H2O   Recurrent unilateral inguinal hernia with obstruction and without gangrene    SBO (small bowel obstruction)    Sleep apnea 3 years ago   Ulcerative colitis 09/24/2019   Vascular dementia without behavioral disturbance 05/19/2021    Past Surgical History:  Procedure Laterality Date   BIOPSY  06/21/2017   Procedure: BIOPSY;  Surgeon: Malissa Hippo, MD;  Location: [facility];  Service: Endoscopy;;  rectal, righta and left colon;   COLONOSCOPY N/A  06/21/2017   Procedure: COLONOSCOPY;  Surgeon: Malissa Hippo, MD;  Location: [facility];  Service: Endoscopy;  Laterality: N/A;  1:25   COLONOSCOPY WITH PROPOFOL N/A 08/31/2022   Procedure: COLONOSCOPY WITH PROPOFOL;  Surgeon: Dolores Frame, MD;  Location: [facility];  Service: Gastroenterology;  Laterality: N/A;  1245 ASA 2   HERNIA REPAIR  3 years ago   INGUINAL HERNIA REPAIR Right 04/04/2018   Procedure: HERNIA REPAIR INGUINAL ADULT WITH MESH;  Surgeon: Franky Macho, MD;  Location: [facility];  Service: General;  Laterality: Right;   INGUINAL HERNIA REPAIR Right 01/31/2019   Procedure: RECURRENT RIGHT INGUINAL HERNIORRHAPY WITH MESH, INCARCERATED;  Surgeon: Franky Macho, MD;  Location: [facility];  Service: General;  Laterality: Right;   JOINT REPLACEMENT  July 19, 2021   Total knee repl.   KNEE SURGERY     tore mcl in high school (70's)   TOTAL KNEE ARTHROPLASTY Left 07/19/2021   Procedure: TOTAL KNEE ARTHROPLASTY;  Surgeon: Ollen Gross, MD;  Location: [facility];  Service: Orthopedics;  Laterality: Left;    Current Outpatient Medications  Medication Sig Dispense Refill   Ascorbic Acid (VITAMIN C) 500 MG CAPS Take 500 mg by mouth daily.     aspirin EC 81 MG tablet Take 81 mg by mouth daily. Swallow whole.     atorvastatin (LIPITOR) 40 MG tablet TAKE ONE TABLET BY MOUTH AT 6PM 90 tablet 1   [employer] B-12 500 MCG tablet Take 500 mcg by mouth daily.     diphenhydrAMINE (BENADRYL) 25 MG tablet Take 25 mg by mouth daily as needed for allergies.     donepezil (ARICEPT) 10 MG tablet Take 1 tablet (10 mg total) by mouth at bedtime. 90 tablet 1   folic acid (FOLVITE) 1 MG tablet TAKE 1 TABLET BY MOUTH EVERY DAY 90 tablet 3   losartan (COZAAR) 100 MG tablet TAKE 1 TABLET BY MOUTH EVERY DAY 90 tablet 3   pantoprazole (PROTONIX) 40 MG tablet Take 1 tablet (40 mg total) by mouth daily. 90 tablet 1   sulfaSALAzine (AZULFIDINE) 500 MG tablet TAKE 2 TABLETS BY MOUTH TWICE A DAY 360  tablet 3   No current facility-administered medications for this visit.    Allergies as of 07/04/2023   (No Known Allergies)    Vitals: Today's Vitals   07/04/23 0957  BP: 121/74  Pulse: 65  Weight: 168 lb (76.2 kg)  Height: 6\' 1"  (1.854 m)   Body mass index is 22.16 kg/m.  Physical exam: General: well developed, well nourished, very pleasant middle-age Caucasian male, seated, in no evident distress Head: head normocephalic and atraumatic.  Neck: supple with no carotid or supraclavicular bruits Cardiovascular: irregular rhythm, no murmurs Skin:  no rash/petichiae Vascular:  Normal pulses all extremities   Neurologic Exam Mental Status: Awake and fully alert.   did have some speech hesitancy when speaking too quickly but unable to appreciate aphasia or dysarthria.   Recent memory impaired and remote memory intact. Attention span, concentration and fund of knowledge impaired. Mood and affect appropriate.  MMSE below.  Cranial Nerves:  Pupils equal, briskly reactive to light. Extraocular movements full without nystagmus. Visual fields full to confrontation. Hearing intact. Facial sensation intact. Face, tongue, palate moves normally and symmetrically.  Motor: Normal bulk and tone. Normal strength in all tested extremity muscles. Sensory.: intact to touch , pinprick , position and vibratory sensation.  Coordination: Rapid alternating movements normal in all extremities. Finger-to-nose and heel-to-shin performed accurately bilaterally. Gait and Station: Arises from chair without difficulty. Stance is normal. Gait demonstrates normal stride length and balance without use of assistive device Reflexes: 1+ and symmetric. Toes downgoing.        07/04/2023   10:06 AM 03/08/2023   12:45 PM 09/14/2022    1:35 PM 03/14/2022    2:22 PM 09/27/2021    9:05 AM 05/19/2021    3:27 PM 03/29/2021    9:55 AM  MMSE - Mini Mental State Exam  Orientation to time 0 3 4 3 3 2 3   Orientation to Place 4  4 5 4 4 4 5   Registration 3 3 3 3 3 3 3   Attention/ Calculation 0 1 1 0 1 1 5   Recall 0 0 0 0 0 0 0  Language- name 2 objects 2 2 2 2 2 2 2   Language- repeat 1 1 1 1 1 1 1   Language- follow 3 step command 3 3 3 3 3 2 3   Language- read & follow direction 0 0 1 1 1  0 1  Write a sentence 1 1 1 1 1 1 1   Copy design 0 0 0 0 1 0 0  Total score 14 18 21 18 20 16 24         Assessment/plan: Justin [name] is a 67 year old male with PMHx of left thalamic stroke 04/2018 likely secondary to small vessel disease, vascular dementia, HTN, HLD, OSA, COVID 19 x2, and metabolic encephalopathy likely in setting of encephalitis 04/2021    1.  Vascular dementia 2.  Cognitive decline -MMSE 14/30 showing decline just over the past several month -increased confusion, worsening agitation/frustration and increased anxiety over the past several months -wife questions if this is due to increased Aricept dosage -recommend trying to decrease back to 5 mg daily. If symptoms persist, would recommend initiating Namenda. May also need to consider treatment for anxiety/depression but will defer to PCP -Complete MRI brain w/wo contrast to rule out structural abnormalities contributing -Neurocognitive eval 07/06/2021 - likely vascular dementia with prior stroke hx likely exacerbated by history of COVID-19 and acute metabolic encephalopathy due to suspected encephalitis -Discussed importance of memory exercises, managing stroke risk factors, healthy diet, routine exercise and adequate sleep.  -Discussed no driving   2. History of stroke -Continue aspirin 81 mg daily and atorvastatin 40 mg daily for secondary stroke prevention -Discussed secondary stroke prevention measures and importance of close follow-up with PCP for HTN and HLD management    3.  Obstructive sleep apnea -Repeat HST 04/2023 confirmed mild to moderate OSA, received new CPAP 04/2023 -Recent compliance report shows improvement of compliance and optimal  residual AHI -  Discussed importance of continued nightly usage and ensuring greater than 4 hours per night per insurance requirements and for optimal benefit -Continue to follow with DME [employer] [facility] for any needed supplies or CPAP related concerns -Per Dr. Vickey Huger, not a candidate for hypoglossal nerve stimulator due to this requiring anesthesia   4.  Irregular heart rhythm -PCP recently complete EKG which showed NSR with frequent PACs -Cardiac monitor 10/2022 no evidence of A-fib     Follow-up in 6 months or call earlier if needed    CC:  Donita Brooks, MD     I spent 45 minutes of face-to-face and non-face-to-face time with patient and wife.  This included previsit chart review, lab review, study review, order entry, electronic health record documentation, patient and wife education and discussion regarding above diagnoses and treatment plan and answered all other questions to patient wife satisfaction   Ihor Austin, AGNP-BC  [facility] [address] [place], [place] [address]  Phone [phone] Fax [phone] Note: This document was prepared with digital dictation and possible smart phrase technology. Any transcriptional errors that result from this process are unintentional.

## 2023-07-06 ENCOUNTER — Telehealth: Payer: Self-pay

## 2023-07-17 ENCOUNTER — Other Ambulatory Visit: Payer: Self-pay | Admitting: Family Medicine

## 2023-07-17 DIAGNOSIS — I6310 Cerebral infarction due to embolism of unspecified precerebral artery: Secondary | ICD-10-CM

## 2023-07-17 DIAGNOSIS — F0150 Vascular dementia without behavioral disturbance: Secondary | ICD-10-CM

## 2023-07-17 DIAGNOSIS — I6381 Other cerebral infarction due to occlusion or stenosis of small artery: Secondary | ICD-10-CM

## 2023-07-18 NOTE — Telephone Encounter (Signed)
Last OV 06/12/23  Requested Prescriptions  Pending Prescriptions Disp Refills   atorvastatin (LIPITOR) 40 MG tablet [Pharmacy Med Name: ATORVASTATIN CALCIUM 40 MG TAB] 90 tablet 1    Sig: TAKE ONE TABLET BY MOUTH AT 6PM     Cardiovascular:  Antilipid - Statins Failed - 07/17/2023  9:26 AM      Failed - Valid encounter within last 12 months    Recent Outpatient Visits           2 years ago Benign essential HTN   [facility] Tanya Nones, Priscille Heidelberg, MD   2 years ago Memory loss   [facility] Donita Brooks, MD   2 years ago Cerebrovascular accident (CVA) due to occlusion of small artery (HCC)   [facility] Donita Brooks, MD   2 years ago Cerebrovascular accident (CVA) due to occlusion of small artery (HCC)   [facility] Pickard, Priscille Heidelberg, MD   2 years ago Atypical pneumonia   [facility] Donita Brooks, MD              Failed - Lipid Panel in normal range within the last 12 months    Cholesterol, Total  Date Value Ref Range Status  09/27/2021 126 100 - 199 mg/dL Final   Cholesterol  Date Value Ref Range Status  06/12/2023 148 <200 mg/dL Final   LDL Cholesterol (Calc)  Date Value Ref Range Status  06/12/2023 75 mg/dL (calc) Final    Comment:    Reference range: <100 . Desirable range <100 mg/dL for primary prevention;   <70 mg/dL for patients with CHD or diabetic patients  with > or = 2 CHD risk factors. Marland Kitchen LDL-C is now calculated using the Martin-Hopkins  calculation, which is a validated novel method providing  better accuracy than the Friedewald equation in the  estimation of LDL-C.  Horald Pollen et al. Lenox Ahr. 4782;956(21): 2061-2068  (http://education.[facility].com/faq/FAQ164)    HDL  Date Value Ref Range Status  06/12/2023 58 > OR = 40 mg/dL Final  30/86/5784 41 >69 mg/dL Final   Triglycerides  Date Value Ref Range Status  06/12/2023 66 <150 mg/dL Final          Passed - Patient is not pregnant

## 2023-07-19 ENCOUNTER — Telehealth: Payer: Self-pay | Admitting: Adult Health

## 2023-07-19 NOTE — Telephone Encounter (Signed)
Cohere Berkley Harvey: [id number] exp. 07/11/23-09/09/23 sent to [facility] [phone]

## 2023-07-26 ENCOUNTER — Other Ambulatory Visit (INDEPENDENT_AMBULATORY_CARE_PROVIDER_SITE_OTHER): Payer: Self-pay | Admitting: Gastroenterology

## 2023-07-26 DIAGNOSIS — K219 Gastro-esophageal reflux disease without esophagitis: Secondary | ICD-10-CM

## 2023-07-31 ENCOUNTER — Ambulatory Visit (HOSPITAL_COMMUNITY)
Admission: RE | Admit: 2023-07-31 | Discharge: 2023-07-31 | Disposition: A | Discharge status: Home / Self Care | Payer: Medicare PPO | Source: Ambulatory Visit | Attending: Adult Health | Admitting: Adult Health

## 2023-07-31 DIAGNOSIS — I6782 Cerebral ischemia: Secondary | ICD-10-CM | POA: Diagnosis not present

## 2023-07-31 DIAGNOSIS — R4189 Other symptoms and signs involving cognitive functions and awareness: Secondary | ICD-10-CM | POA: Insufficient documentation

## 2023-07-31 DIAGNOSIS — F01B3 Vascular dementia, moderate, with mood disturbance: Secondary | ICD-10-CM | POA: Diagnosis not present

## 2023-07-31 DIAGNOSIS — R9089 Other abnormal findings on diagnostic imaging of central nervous system: Secondary | ICD-10-CM | POA: Diagnosis not present

## 2023-07-31 DIAGNOSIS — F0390 Unspecified dementia without behavioral disturbance: Secondary | ICD-10-CM | POA: Diagnosis not present

## 2023-07-31 MED ORDER — GADOBUTROL 1 MMOL/ML IV SOLN
7.5000 mL | Freq: Once | INTRAVENOUS | Status: AC | PRN
  Administered 2023-07-31: 7.5 mL via INTRAVENOUS

## 2023-08-03 DIAGNOSIS — G4733 Obstructive sleep apnea (adult) (pediatric): Secondary | ICD-10-CM | POA: Diagnosis not present

## 2023-08-11 DIAGNOSIS — G4733 Obstructive sleep apnea (adult) (pediatric): Secondary | ICD-10-CM | POA: Diagnosis not present

## 2023-08-17 ENCOUNTER — Encounter: Payer: Self-pay | Admitting: Adult Health

## 2023-08-30 ENCOUNTER — Ambulatory Visit: Payer: Medicare PPO | Admitting: Family Medicine

## 2023-08-30 ENCOUNTER — Encounter: Payer: Self-pay | Admitting: Family Medicine

## 2023-08-30 VITALS — BP 124/74 | HR 54 | Temp 98.3°F | Ht 73.0 in | Wt 170.0 lb

## 2023-08-30 DIAGNOSIS — Z23 Encounter for immunization: Secondary | ICD-10-CM

## 2023-08-30 DIAGNOSIS — Z0000 Encounter for general adult medical examination without abnormal findings: Secondary | ICD-10-CM | POA: Diagnosis not present

## 2023-08-30 NOTE — Addendum Note (Signed)
Addended by: Arta Silence on: 08/30/2023 04:08 PM   Modules accepted: Orders

## 2023-08-30 NOTE — Progress Notes (Signed)
Subjective:   Justin [name] is a 67 y.o. male who presents for Medicare Annual/Subsequent preventive examination.  Visit Complete: In person  Patient Medicare AWV questionnaire was completed by the patient on 08/29/2023; I have confirmed that all information answered by patient is correct and no changes since this date.  Cardiac Risk Factors include: advanced age (>60men, >61 women);male gender     Objective:    Today's Vitals   08/30/23 1406  BP: 124/74  Pulse: (!) 54  Temp: 98.3 F (36.8 C)  TempSrc: Oral  SpO2: 99%  Weight: 170 lb (77.1 kg)  Height: 6\' 1"  (1.854 m)   Body mass index is 22.43 kg/m.     08/30/2023    2:24 PM 08/31/2022   11:29 AM 08/25/2022   11:18 AM 01/26/2022   11:11 AM 08/20/2021   10:56 AM 07/19/2021    5:43 PM 07/08/2021    9:33 AM  Advanced Directives  Does Patient Have a Medical Advance Directive? Yes Yes Yes No No No No  Type of Estate agent of [employer] Power of [place];Living will Healthcare Power of [place];Living will      Does patient want to make changes to medical advance directive?      Yes (MAU/Ambulatory/Procedural Areas - Information given) Yes (MAU/Ambulatory/Procedural Areas - Information given)  Copy of Healthcare Power of Attorney in Chart?   No - copy requested      Would patient like information on creating a medical advance directive?     No - Patient declined No - Patient declined     Current Medications (verified) Outpatient Encounter Medications as of 08/30/2023  Medication Sig   Ascorbic Acid (VITAMIN C) 500 MG CAPS Take 500 mg by mouth daily.   aspirin EC 81 MG tablet Take 81 mg by mouth daily. Swallow whole.   atorvastatin (LIPITOR) 40 MG tablet TAKE ONE TABLET BY MOUTH AT 6PM   [facility] B-12 500 MCG tablet Take 500 mcg by mouth daily.   diphenhydrAMINE (BENADRYL) 25 MG tablet Take 25 mg by mouth daily as needed for allergies.   donepezil (ARICEPT) 10 MG tablet Take 1 tablet (10 mg total)  by mouth at bedtime. (Patient taking differently: Take 5 mg by mouth at bedtime. Pt is taking only 5mg  per neurologist)   folic acid (FOLVITE) 1 MG tablet TAKE 1 TABLET BY MOUTH EVERY DAY   losartan (COZAAR) 100 MG tablet TAKE 1 TABLET BY MOUTH EVERY DAY   pantoprazole (PROTONIX) 40 MG tablet TAKE 1 TABLET BY MOUTH EVERY DAY   sulfaSALAzine (AZULFIDINE) 500 MG tablet TAKE 2 TABLETS BY MOUTH TWICE A DAY   No facility-administered encounter medications on file as of 08/30/2023.    Allergies (verified) Patient has no known allergies.   History: Past Medical History:  Diagnosis Date   Acid reflux 04/27/2021   Acute metabolic encephalopathy 04/30/2021   Anxiety    Arthritis    Cerebral embolism with cerebral infarction 04/15/2018   Chest pain 05/19/2015   Depression    EEG abnormality without seizure 05/19/2021   Encephalitis 04/30/2021   Encephalopathy due to COVID-19 virus    Episodic confusion 05/19/2021   Heart murmur    Hyperglycemia 05/19/2015   Hypertension    Left thalamic lacunar infarct 05/24/2018   OSA (obstructive sleep apnea)    cpap 9 cm H2O   Recurrent unilateral inguinal hernia with obstruction and without gangrene    SBO (small bowel obstruction)    Sleep apnea 3  years ago   Ulcerative colitis 09/24/2019   Vascular dementia without behavioral disturbance 05/19/2021   Past Surgical History:  Procedure Laterality Date   BIOPSY  06/21/2017   Procedure: BIOPSY;  Surgeon: Malissa Hippo, MD;  [facility];  Service: Endoscopy;;  rectal, righta and left colon;   COLONOSCOPY N/A 06/21/2017   Procedure: COLONOSCOPY;  Surgeon: Malissa Hippo, MD;  [facility]: [facility];  Service: Endoscopy;  Laterality: N/A;  1:25   COLONOSCOPY WITH PROPOFOL N/A 08/31/2022   Procedure: COLONOSCOPY WITH PROPOFOL;  Surgeon: Dolores Frame, MD;  Location: [facility];  Service: Gastroenterology;  Laterality: N/A;  1245 ASA 2   HERNIA REPAIR  3 years ago    INGUINAL HERNIA REPAIR Right 04/04/2018   Procedure: HERNIA REPAIR INGUINAL ADULT WITH MESH;  Surgeon: Franky Macho, MD;  Location: [facility];  Service: General;  Laterality: Right;   INGUINAL HERNIA REPAIR Right 01/31/2019   Procedure: RECURRENT RIGHT INGUINAL HERNIORRHAPY WITH MESH, INCARCERATED;  Surgeon: Franky Macho, MD;  Location: [facility];  Service: General;  Laterality: Right;   JOINT REPLACEMENT  July 19, 2021   Total knee repl.   KNEE SURGERY     tore mcl in high school (70's)   TOTAL KNEE ARTHROPLASTY Left 07/19/2021   Procedure: TOTAL KNEE ARTHROPLASTY;  Surgeon: Ollen Gross, MD;  Location: [facility];  Service: Orthopedics;  Laterality: Left;   Family History  Problem Relation Age of Onset   Tuberculosis Mother    Stroke Mother    Arthritis Mother    Cancer Mother    Heart disease Father        died at 27   Early death Father    Bradycardia Brother        pacemaker   Stroke Brother    Hypertension Brother    Mental illness Paternal Grandfather        suicide at 65   Suicidality Paternal Grandfather    Cancer Paternal Uncle    Dementia Maternal Uncle 1   Colon cancer Neg Hx    Social History   Socioeconomic History   Marital status: Married    Spouse name: Burna Mortimer   Number of children: Not on file   Years of education: 12   Highest education level: 12th grade  Occupational History   Occupation: Unemployed/retired    Comment: Personnel officer  Tobacco Use   Smoking status: Never    Passive exposure: Past   Smokeless tobacco: Never  Vaping Use   Vaping status: Never Used  Substance and Sexual Activity   Alcohol use: No   Drug use: No   Sexual activity: Yes    Birth control/protection: None  Other Topics Concern   Not on file  Social History Narrative   Lives with wife   Social Determinants of Health   Financial Resource Strain: Low Risk  (08/30/2023)   Overall Financial Resource Strain (CARDIA)    Difficulty of Paying Living Expenses: Not hard at all   Food Insecurity: No Food Insecurity (08/30/2023)   Hunger Vital Sign    Worried About Running Out of Food in the Last Year: Never true    Ran Out of Food in the Last Year: Never true  Transportation Needs: No Transportation Needs (08/30/2023)   PRAPARE - Administrator, Civil Service (Medical): No    Lack of Transportation (Non-Medical): No  Physical Activity: Insufficiently Active (08/30/2023)   Exercise Vital Sign    Days of Exercise  per Week: 2 days    Minutes of Exercise per Session: 30 min  Stress: No Stress Concern Present (08/30/2023)   [employer] of Occupational Health - Occupational Stress Questionnaire    Feeling of Stress : Not at all  Social Connections: Socially Integrated (08/30/2023)   Social Connection and Isolation Panel [NHANES]    Frequency of Communication with Friends and Family: Three times a week    Frequency of Social Gatherings with Friends and Family: Three times a week    Attends Religious Services: 1 to 4 times per year    Active Member of Clubs or Organizations: Yes    Attends Banker Meetings: 1 to 4 times per year    Marital Status: Married    Tobacco Counseling Counseling given: Yes   Clinical Intake:     Pain : No/denies pain     BMI - recorded: 22.43 Nutritional Status: BMI of 19-24  Normal Diabetes: No  How often do you need to have someone help you when you read instructions, pamphlets, or other written materials from your doctor or pharmacy?: 1 - Never What is the last grade level you completed in school?: 12 grade         Activities of Daily Living    08/30/2023    2:13 PM 08/28/2023    9:22 PM  In your present state of health, do you have any difficulty performing the following activities:  Hearing? 0 0  Vision? 0 0  Difficulty concentrating or making decisions? 0 1  Walking or climbing stairs? 0 0  Dressing or bathing? 0 0  Doing errands, shopping? 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 Y N  In the past six months, have you accidently leaked urine? N N  Do you have problems with loss of bowel control? N N  Managing your Medications? N Y  Comment needs assistants   Managing your Finances? Malvin Johns  Comment needs assistance   Housekeeping or managing your Housekeeping? Alpha Gula  Comment needs assistance     Patient Care Team: Donita Brooks, MD as PCP - General (Family Medicine)  Indicate any recent Medical Services you may have received from other than [facility] providers in the past year (date may be approximate).     Assessment:   This is a routine wellness examination for G[place]y.  Hearing/Vision screen No results found.   Goals Addressed             This Visit's Progress    Exercise daily (30 min per time)   On track      Depression Screen    08/30/2023    2:22 PM 08/25/2022   11:16 AM 08/20/2021   10:51 AM 12/21/2020    9:02 AM 05/18/2020   11:37 AM 01/14/2020   10:04 AM 01/14/2020   10:03 AM  PHQ 2/9 Scores  PHQ - 2 Score 0 0 1 0 0 1 1  PHQ- 9 Score   2  6 5      Fall Risk    08/30/2023    2:25 PM 08/28/2023    9:22 PM 08/25/2022   11:21 AM 08/22/2022   10:23 AM 08/20/2021   10:57 AM  Fall Risk   Falls in the past year? 0 0 0 0 0  Number falls in past yr: 0 0 0 0 0  Injury with Fall? 0  0  0  Risk for fall due to : No Fall Risks    Impaired mobility  Follow up     Falls prevention discussed    MEDICARE RISK AT HOME: Medicare Risk at Home Any stairs in or around the home?: (P) Yes If so, are there any without handrails?: (P) No Home free of loose throw rugs in walkways, pet beds, electrical cords, etc?: (P) Yes Adequate lighting in your home to reduce risk of falls?: (P) Yes Life alert?: (P) No Use of a cane, walker or w/c?: (P) No Grab bars in the bathroom?: (P) Yes Shower chair or bench in shower?: (P) Yes Elevated toilet seat or a handicapped toilet?: (P) Yes  TIMED UP AND GO:  Was  the test performed?  Yes  Length of time to ambulate 10 feet: 10 sec Gait steady and fast without use of assistive device    Cognitive Function:    07/04/2023   10:06 AM 03/08/2023   12:45 PM 09/14/2022    1:35 PM 03/14/2022    2:22 PM 09/27/2021    9:05 AM  MMSE - Mini Mental State Exam  Orientation to time 0 3 4 3 3   Orientation to Place 4 4 5 4 4   Registration 3 3 3 3 3   Attention/ Calculation 0 1 1 0 1  Recall 0 0 0 0 0  Language- name 2 objects 2 2 2 2 2   Language- repeat 1 1 1 1 1   Language- follow 3 step command 3 3 3 3 3   Language- read & follow direction 0 0 1 1 1   Write a sentence 1 1 1 1 1   Copy design 0 0 0 0 1  Total score 14 18 21 18 20         08/30/2023    2:25 PM 08/25/2022   11:29 AM 08/20/2021   11:03 AM  6CIT Screen  What Year? 4 points 0 points 0 points  What month? 0 points 3 points 0 points  What time? 0 points 0 points 0 points  Count back from 20 4 points 0 points 0 points  Months in reverse 4 points 4 points 4 points  Repeat phrase 10 points 10 points 6 points  Total Score 22 points 17 points 10 points    Immunizations Immunization History  Administered Date(s) Administered   Influenza Split 09/05/2015   Influenza,inj,Quad PF,6+ Mos 09/13/2018, 08/25/2022   Influenza,inj,quad, With Preservative 09/07/2017, 09/13/2018, 08/17/2019   Influenza,trivalent, recombinat, inj, PF 08/05/2017   PNEUMOCOCCAL CONJUGATE-20 08/25/2022   Tdap 11/07/2008    TDAP status: Patient wishes to receive this vaccine today after disclosing that insurance does not cover the vaccine as a preventative vaccine.   Flu Vaccine status: Completed at today's visit  Pneumococcal vaccine status: Up to date  Covid-19 vaccine status: Declined, Education has been provided regarding the importance of this vaccine but patient still declined. Advised may receive this vaccine at local pharmacy or Health Dept.or vaccine clinic. Aware to provide a copy of the vaccination record if  obtained from local pharmacy or Health Dept. Verbalized acceptance and understanding.  Qualifies for Shingles Vaccine? Yes   Zostavax completed No   Shingrix Completed?: No.    Education has been provided regarding the importance of this vaccine. Patient has been advised to call insurance company to determine out of pocket expense if they have not yet received this vaccine. Advised may also receive vaccine at local pharmacy or Health Dept. Verbalized acceptance and understanding.  Screening Tests Health  Maintenance  Topic Date Due   Hepatitis C Screening  Never done   Zoster Vaccines- Shingrix (1 of 2) Never done   DTaP/Tdap/Td (2 - Td or Tdap) 11/07/2018   INFLUENZA VACCINE  06/08/2023   Medicare Annual Wellness (AWV)  08/29/2024   Colonoscopy  08/31/2025   Pneumonia Vaccine 46+ Years old  Completed   HPV VACCINES  Aged Out   COVID-19 Vaccine  Discontinued    Health Maintenance  Health Maintenance Due  Topic Date Due   Hepatitis C Screening  Never done   Zoster Vaccines- Shingrix (1 of 2) Never done   DTaP/Tdap/Td (2 - Td or Tdap) 11/07/2018   INFLUENZA VACCINE  06/08/2023    Colorectal cancer screening: Type of screening: Colonoscopy. Completed 08/31/2022. Repeat every 3 years  Lung Cancer Screening: (Low Dose CT Chest recommended if Age 65-80 years, 20 pack-year currently smoking OR have quit w/in 15years.) does not qualify.   Lung Cancer Screening Referral: n/a  Additional Screening:  Hepatitis C Screening: does qualify; Completed declined  Vision Screening: Recommended annual ophthalmology exams for early detection of glaucoma and other disorders of the eye. Is the patient up to date with their annual eye exam?  Yes  Who is the provider or what is the name of the office in which the patient attends annual eye exams? [facility] If pt is not established with a provider, would they like to be referred to a provider to establish care? No .   Dental Screening: Recommended  annual dental exams for proper oral hygiene  Diabetic Foot Exam: n/a  Community Resource Referral / Chronic Care Management: CRR required this visit?  No   CCM required this visit?  No     Plan:     I have personally reviewed and noted the following in the patient's chart:   Medical and social history Use of alcohol, tobacco or illicit drugs  Current medications and supplements including opioid prescriptions. Patient is not currently taking opioid prescriptions. Functional ability and status Nutritional status Physical activity Advanced directives List of other physicians Hospitalizations, surgeries, and ER visits in previous 12 months Vitals Screenings to include cognitive, depression, and falls Referrals and appointments  In addition, I have reviewed and discussed with patient certain preventive protocols, quality metrics, and best practice recommendations. A written personalized care plan for preventive services as well as general preventive health recommendations were provided to patient.     Park Meo, FNP   08/30/2023   After Visit Summary: (In Person-Printed) AVS printed and given to the patient  Nurse Notes: n/a

## 2023-09-02 DIAGNOSIS — G4733 Obstructive sleep apnea (adult) (pediatric): Secondary | ICD-10-CM | POA: Diagnosis not present

## 2023-09-13 ENCOUNTER — Ambulatory Visit: Payer: Medicare PPO | Admitting: Adult Health

## 2023-09-18 ENCOUNTER — Encounter (INDEPENDENT_AMBULATORY_CARE_PROVIDER_SITE_OTHER): Payer: Self-pay | Admitting: Gastroenterology

## 2023-09-18 ENCOUNTER — Ambulatory Visit (INDEPENDENT_AMBULATORY_CARE_PROVIDER_SITE_OTHER): Payer: Medicare PPO | Admitting: Gastroenterology

## 2023-09-18 VITALS — BP 146/82 | HR 60 | Temp 97.1°F | Ht 73.0 in | Wt 171.1 lb

## 2023-09-18 DIAGNOSIS — K219 Gastro-esophageal reflux disease without esophagitis: Secondary | ICD-10-CM

## 2023-09-18 DIAGNOSIS — Z79899 Other long term (current) drug therapy: Secondary | ICD-10-CM

## 2023-09-18 DIAGNOSIS — Z5181 Encounter for therapeutic drug level monitoring: Secondary | ICD-10-CM | POA: Diagnosis not present

## 2023-09-18 DIAGNOSIS — K5100 Ulcerative (chronic) pancolitis without complications: Secondary | ICD-10-CM

## 2023-09-18 NOTE — Progress Notes (Signed)
Referring Provider: Donita Brooks, MD Primary Care Physician:  Donita Brooks, MD Primary GI Physician: Dr. Levon Hedger    Chief Complaint  Patient presents with   Follow-up    Patient here today for a follow up on his UC. Patient is currently taking Sulfasalazine 500 mg two tablets bid. Patient denies any current issues with the UC.    HPI:   Justin [name] is a 67 y.o. male with past medical history of  ulcerative pancolitis, history of stroke, GERD, vascular dementia, hypertension and hyperlipidemia   Patient presenting today for follow up of Ulcerative colitis and GERD   C diff and GI path in March were negative, Calprotectin was elevated at 229, advised to repeat in 3 months  Other labs in march with normal CMP and CBC TSH in August 2023 was 2.1  Last seen July 2024, at that time wife reported Aricept was recently increased from 5 mg to 10 mg.  Patient appearing to use the restroom more frequently though unsure if he was urinating or having bowel movements.  Denied fecal incontinence or obvious rectal bleeding.  Patient denied diarrhea and wife had not seen any herself.  GERD well-controlled.  Recommended to continue with Protonix 40 mg daily, continue sulfasalazine 1 g twice daily, continue folic acid daily, try to track amount of BMs per day, follow-up for repeat fecal calprotectin, CMP, CBC, CRP.  July 2024, Calprotectin 86, CRP <3  CBC and CMP in August were WNL  Present: History provided some by his wife due to patient's history of dementia. She is unsure how often patient is having a BM though seems better since aricept was decreased back to 5mg  daily. She thinks he may be frequenting the restroom less since aricept was decreased. Patient unsure how often he is having a BM. States his stools are regular. He denies rectal bleeding, melena or diarrhea. Wife reports patient is eating well. Denies any nausea or vomiting.   Patient denies heartburn or acid  regurgitation.  Wife denies any GERD complaints at home from the patient. He is taking protonix 40mg  daily  Last Colonoscopy:08/31/2022 Diverticulosis. Mayo 0 throughout the colon,  in remission.  Last Endoscopy: never    Recommendations:  Repeat colonoscopy advised in 3 years.     Past Medical History:  Diagnosis Date   Acid reflux 04/27/2021   Acute metabolic encephalopathy 04/30/2021   Anxiety    Arthritis    Cerebral embolism with cerebral infarction 04/15/2018   Chest pain 05/19/2015   Depression    EEG abnormality without seizure 05/19/2021   Encephalitis 04/30/2021   Encephalopathy due to COVID-19 virus    Episodic confusion 05/19/2021   Heart murmur    Hyperglycemia 05/19/2015   Hypertension    Left thalamic lacunar infarct 05/24/2018   OSA (obstructive sleep apnea)    cpap 9 cm H2O   Recurrent unilateral inguinal hernia with obstruction and without gangrene    SBO (small bowel obstruction)    Sleep apnea 3 years ago   Ulcerative colitis 09/24/2019   Vascular dementia without behavioral disturbance 05/19/2021    Past Surgical History:  Procedure Laterality Date   BIOPSY  06/21/2017   Procedure: BIOPSY;  Surgeon: Malissa Hippo, MD;  [facility];  Service: Endoscopy;;  rectal, righta and left colon;   COLONOSCOPY N/A 06/21/2017   Procedure: COLONOSCOPY;  Surgeon: Malissa Hippo, MD;  [facility];  Service: Endoscopy;  Laterality: N/A;  1:25  COLONOSCOPY WITH PROPOFOL N/A 08/31/2022   Procedure: COLONOSCOPY WITH PROPOFOL;  Surgeon: Dolores Frame, MD;  [facility];  Service: Gastroenterology;  Laterality: N/A;  1245 ASA 2   HERNIA REPAIR  3 years ago   INGUINAL HERNIA REPAIR Right 04/04/2018   Procedure: HERNIA REPAIR INGUINAL ADULT WITH MESH;  Surgeon: Franky Macho, MD;  [facility]: [facility];  Service: General;  Laterality: Right;   INGUINAL HERNIA REPAIR Right 01/31/2019   Procedure: RECURRENT RIGHT INGUINAL HERNIORRHAPY WITH  MESH, INCARCERATED;  Surgeon: Franky Macho, MD;  Location: [facility];  Service: General;  Laterality: Right;   JOINT REPLACEMENT  July 19, 2021   Total knee repl.   KNEE SURGERY     tore mcl in high school (70's)   TOTAL KNEE ARTHROPLASTY Left 07/19/2021   Procedure: TOTAL KNEE ARTHROPLASTY;  Surgeon: Ollen Gross, MD;  [facility]: [facility];  Service: Orthopedics;  Laterality: Left;    Current Outpatient Medications  Medication Sig Dispense Refill   Ascorbic Acid (VITAMIN C) 500 MG CAPS Take 500 mg by mouth daily.     aspirin EC 81 MG tablet Take 81 mg by mouth daily. Swallow whole.     atorvastatin (LIPITOR) 40 MG tablet TAKE ONE TABLET BY MOUTH AT 6PM 90 tablet 1   [employer] B-12 500 MCG tablet Take 500 mcg by mouth daily.     diphenhydrAMINE (BENADRYL) 25 MG tablet Take 25 mg by mouth daily as needed for allergies.     donepezil (ARICEPT) 10 MG tablet Take 1 tablet (10 mg total) by mouth at bedtime. (Patient taking differently: Take 5 mg by mouth at bedtime. Pt is taking only 5mg  per neurologist) 90 tablet 1   folic acid (FOLVITE) 1 MG tablet TAKE 1 TABLET BY MOUTH EVERY DAY 90 tablet 3   losartan (COZAAR) 100 MG tablet TAKE 1 TABLET BY MOUTH EVERY DAY 90 tablet 3   pantoprazole (PROTONIX) 40 MG tablet TAKE 1 TABLET BY MOUTH EVERY DAY 90 tablet 1   sulfaSALAzine (AZULFIDINE) 500 MG tablet TAKE 2 TABLETS BY MOUTH TWICE A DAY 360 tablet 3   No current facility-administered medications for this visit.    Allergies as of 09/18/2023   (No Known Allergies)    Family History  Problem Relation Age of Onset   Tuberculosis Mother    Stroke Mother    Arthritis Mother    Cancer Mother    Heart disease Father        died at 37   Early death Father    Bradycardia Brother        pacemaker   Stroke Brother    Hypertension Brother    Mental illness Paternal Grandfather        suicide at 110   Suicidality Paternal Grandfather    Cancer Paternal Uncle    Dementia Maternal Uncle 91   Colon  cancer Neg Hx     Social History   Socioeconomic History   Marital status: Married    Spouse name: Burna Mortimer   Number of children: Not on file   Years of education: 12   Highest education level: 12th grade  Occupational History   Occupation: Unemployed/retired    Comment: Personnel officer  Tobacco Use   Smoking status: Never    Passive exposure: Past   Smokeless tobacco: Never  Vaping Use   Vaping status: Never Used  Substance and Sexual Activity   Alcohol use: No   Drug use: No   Sexual activity: Yes  Birth control/protection: None  Other Topics Concern   Not on file  Social History Narrative   Lives with wife   Social Determinants of Health   Financial Resource Strain: Low Risk  (08/30/2023)   Overall Financial Resource Strain (CARDIA)    Difficulty of Paying Living Expenses: Not hard at all  Food Insecurity: No Food Insecurity (08/30/2023)   Hunger Vital Sign    Worried About Running Out of Food in the Last Year: Never true    Ran Out of Food in the Last Year: Never true  Transportation Needs: No Transportation Needs (08/30/2023)   PRAPARE - Administrator, Civil Service (Medical): No    Lack of Transportation (Non-Medical): No  Physical Activity: Insufficiently Active (08/30/2023)   Exercise Vital Sign    Days of Exercise per Week: 2 days    Minutes of Exercise per Session: 30 min  Stress: No Stress Concern Present (08/30/2023)   [employer] of Occupational Health - Occupational Stress Questionnaire    Feeling of Stress : Not at all  Social Connections: Socially Integrated (08/30/2023)   Social Connection and Isolation Panel [NHANES]    Frequency of Communication with Friends and Family: Three times a week    Frequency of Social Gatherings with Friends and Family: Three times a week    Attends Religious Services: 1 to 4 times per year    Active Member of Clubs or Organizations: Yes    Attends Banker Meetings: 1 to 4 times per year     Marital Status: Married    Review of systems General: negative for malaise, night sweats, fever, chills, weight loss Neck: Negative for lumps, goiter, pain and significant neck swelling Resp: Negative for cough, wheezing, dyspnea at rest CV: Negative for chest pain, leg swelling, palpitations, orthopnea GI: denies melena, hematochezia, nausea, vomiting, diarrhea, constipation, dysphagia, odyonophagia, early satiety or unintentional weight loss.  MSK: Negative for joint pain or swelling, back pain, and muscle pain. Derm: Negative for itching or rash Psych: Denies depression, anxiety, memory loss, confusion. No homicidal or suicidal ideation.  Heme: Negative for prolonged bleeding, bruising easily, and swollen nodes. Endocrine: Negative for cold or heat intolerance, polyuria, polydipsia and goiter. Neuro: negative for tremor, gait imbalance, syncope and seizures. The remainder of the review of systems is noncontributory.  Physical Exam: BP (!) 146/82 (BP Location: Left Arm, Patient Position: Sitting, Cuff Size: Normal)   Pulse 60   Temp (!) 97.1 F (36.2 C) (Temporal)   Ht 6\' 1"  (1.854 m)   Wt 171 lb 1.6 oz (77.6 kg)   BMI 22.57 kg/m  General:   Alert and oriented. No distress noted. Pleasant and cooperative.  Head:  Normocephalic and atraumatic. Eyes:  Conjuctiva clear without scleral icterus. Mouth:  Oral mucosa pink and moist. Good dentition. No lesions. Heart: Normal rate and rhythm, s1 and s2 heart sounds present.  Lungs: Clear lung sounds in all lobes. Respirations equal and unlabored. Abdomen:  +BS, soft, non-tender and non-distended. No rebound or guarding. No HSM or masses noted. Derm: No palmar erythema or jaundice Msk:  Symmetrical without gross deformities. Normal posture. Extremities:  Without edema. Neurologic:  Alert and  oriented to person, place Psych:  Alert and cooperative. Normal mood and affect.  Invalid input(s): "6 MONTHS"   ASSESSMENT: Justin [name] is  a 67 y.o. male presenting today for follow up of UC and GERD  UC: UC appears well-controlled on sulfasalazine 1 g twice daily.  CRP and  fecal calprotectin were normal in July.  Patient denies diarrhea and wife reports he is frequenting the restroom less since Aricept was decreased by neurology.  Last colonoscopy in 2023 with recommendations to repeat in 3 years.  We will continue with sulfasalazine 1 g twice daily, folic acid daily and plan to repeat inflammatory markers at next visit.  GERD: Well-managed on Protonix 40 mg daily.  Patient denies any current GERD symptoms.  Will continue with PPI daily and good reflux precautions, which were provided to the patient.   PLAN:  Repeat CMP, CBC 2. Continue folic acid daily 3. Continue sulfasalazine 1g BID 4. Continue protonix 40mg  daily, good reflux precautions  5. CRP and fecal calprotectin at next visit   All questions were answered, patient verbalized understanding and is in agreement with plan as outlined above.   Follow Up: 3 months   Sanii Kukla L. Jeanmarie Hubert, MSN, APRN, AGNP-C Adult-Gerontology Nurse Practitioner [facility]  I have reviewed the note and agree with the APP's assessment as described in this progress note   Katrinka Blazing, MD Gastroenterology and Hepatology [facility] Gastroenterology

## 2023-09-18 NOTE — Patient Instructions (Addendum)
Please continue with sulfasalazine 1000mg  twice daily Continue with folic acid daily Continue protonix 40mg  daily and be mindful of greasy, spicy, tomato/citrus based foods, as well as chocolate and caffeine, avoid eating late and stay upright 2-3 hours after eating, prior to lying down  Follow up 3 months  It was a pleasure to see you today. I want to create trusting relationships with patients and provide genuine, compassionate, and quality care. I truly value your feedback! please be on the lookout for a survey regarding your visit with me today. I appreciate your input about our visit and your time in completing this!    Jasmain Ahlberg L. Jeanmarie Hubert, MSN, APRN, AGNP-C Adult-Gerontology Nurse Practitioner [facility] Gastroenterology at [facility]

## 2023-09-19 ENCOUNTER — Encounter (INDEPENDENT_AMBULATORY_CARE_PROVIDER_SITE_OTHER): Payer: Self-pay

## 2023-09-19 LAB — COMPREHENSIVE METABOLIC PANEL
AG Ratio: 1.6 (calc) (ref 1.0–2.5)
ALT: 19 U/L (ref 9–46)
AST: 24 U/L (ref 10–35)
Albumin: 4.2 g/dL (ref 3.6–5.1)
Alkaline phosphatase (APISO): 79 U/L (ref 35–144)
BUN: 16 mg/dL (ref 7–25)
CO2: 25 mmol/L (ref 20–32)
Calcium: 9.4 mg/dL (ref 8.6–10.3)
Chloride: 103 mmol/L (ref 98–110)
Creat: 1.02 mg/dL (ref 0.70–1.35)
Globulin: 2.6 g/dL (ref 1.9–3.7)
Glucose, Bld: 149 mg/dL — ABNORMAL HIGH (ref 65–99)
Potassium: 4 mmol/L (ref 3.5–5.3)
Sodium: 137 mmol/L (ref 135–146)
Total Bilirubin: 1 mg/dL (ref 0.2–1.2)
Total Protein: 6.8 g/dL (ref 6.1–8.1)

## 2023-09-19 LAB — CBC
HCT: 42.5 % (ref 38.5–50.0)
Hemoglobin: 14.5 g/dL (ref 13.2–17.1)
MCH: 34.4 pg — ABNORMAL HIGH (ref 27.0–33.0)
MCHC: 34.1 g/dL (ref 32.0–36.0)
MCV: 100.7 fL — ABNORMAL HIGH (ref 80.0–100.0)
MPV: 9.8 fL (ref 7.5–12.5)
Platelets: 289 10*3/uL (ref 140–400)
RBC: 4.22 10*6/uL (ref 4.20–5.80)
RDW: 11.7 % (ref 11.0–15.0)
WBC: 6.5 10*3/uL (ref 3.8–10.8)

## 2023-09-29 ENCOUNTER — Encounter: Payer: Self-pay | Admitting: Adult Health

## 2023-10-02 MED ORDER — DONEPEZIL HCL 5 MG PO TABS: 5.0000 mg | ORAL_TABLET | Freq: Every day | ORAL | 1 refills | Status: DC

## 2023-10-03 DIAGNOSIS — G4733 Obstructive sleep apnea (adult) (pediatric): Secondary | ICD-10-CM | POA: Diagnosis not present

## 2023-11-02 DIAGNOSIS — G4733 Obstructive sleep apnea (adult) (pediatric): Secondary | ICD-10-CM | POA: Diagnosis not present

## 2023-12-03 DIAGNOSIS — G4733 Obstructive sleep apnea (adult) (pediatric): Secondary | ICD-10-CM | POA: Diagnosis not present

## 2023-12-15 DIAGNOSIS — G4733 Obstructive sleep apnea (adult) (pediatric): Secondary | ICD-10-CM | POA: Diagnosis not present

## 2023-12-19 ENCOUNTER — Encounter (INDEPENDENT_AMBULATORY_CARE_PROVIDER_SITE_OTHER): Payer: Self-pay | Admitting: Gastroenterology

## 2023-12-19 ENCOUNTER — Ambulatory Visit (INDEPENDENT_AMBULATORY_CARE_PROVIDER_SITE_OTHER): Payer: Medicare PPO | Admitting: Gastroenterology

## 2023-12-19 VITALS — BP 146/87 | HR 59 | Temp 97.1°F | Ht 74.0 in | Wt 171.1 lb

## 2023-12-19 DIAGNOSIS — K219 Gastro-esophageal reflux disease without esophagitis: Secondary | ICD-10-CM

## 2023-12-19 DIAGNOSIS — K5100 Ulcerative (chronic) pancolitis without complications: Secondary | ICD-10-CM | POA: Diagnosis not present

## 2023-12-19 MED ORDER — PANTOPRAZOLE SODIUM 40 MG PO TBEC: 40.0000 mg | DELAYED_RELEASE_TABLET | Freq: Every day | ORAL | 3 refills | Status: AC

## 2023-12-19 NOTE — Patient Instructions (Addendum)
-  we will check inflammatory markers  -Continue sulfasalazine 1 g twice daily -Continue folic acid supplementation -Continue Protonix 40 mg daily  Follow up 6 months   It was a pleasure to see you today. I want to create trusting relationships with patients and provide genuine, compassionate, and quality care. I truly value your feedback! please be on the lookout for a survey regarding your visit with me today. I appreciate your input about our visit and your time in completing this!    Lataisha Colan L. Jeanmarie Hubert, MSN, APRN, AGNP-C Adult-Gerontology Nurse Practitioner [facility] Gastroenterology at [facility]

## 2023-12-19 NOTE — Progress Notes (Addendum)
Referring Provider: Donita Brooks, MD Primary Care Physician:  Justin Brooks, MD Primary GI Physician: Dr. Levon Hedger   Chief Complaint  Patient presents with   Follow-up    Patient here today for a follow up on UC. Patient doing well on Sulfasalazine 500 mg two tablets bid.    HPI:   Justin [name] is a 68 y.o. male with past medical history of ulcerative pancolitis, history of stroke, GERD, vascular dementia, hypertension and hyperlipidemia   Patient presenting today for follow-up of ulcerative colitis  Patient last seen in November, at that time unsure how often patient is having BM though seemed better since his Aricept was decreased back to 5 mg daily.  Feel that stools were regular and not diarrhea.  Denied rectal bleeding melena or appetite changes.  Felt GERD was well-controlled on Protonix 40 mg daily.  Patient commended to have CMP CBC, continue folic acid, continue sulfasalazine 1 g twice daily, continue Protonix 40 mg daily and good reflux precautions, consider CRP and fecal calprotectin at next visit  CBC and CMP done on 11/17/2022, mostly unremarkable  Present: Patient feeling good. Wife helps provide history, she reports he is doing well. He is eating well. It appears per patient report he may be having 2 BMs per day. Wife does report he gets up around 3am to use the restroom but she thinks this is just to urinate. Patient denies any abdominal pain. Feels that GERD is well controlled. Weight is stable. Wife reports he loves to be outside and rake leaves which he does frequently when the weather is good.    Last Colonoscopy:08/31/2022 Diverticulosis. Mayo 0 throughout the colon,  in remission.  Last Endoscopy: never    Recommendations:  Repeat colonoscopy advised in 3 years.     Past Medical History:  Diagnosis Date   Acid reflux 04/27/2021   Acute metabolic encephalopathy 04/30/2021   Anxiety    Arthritis    Cerebral embolism with cerebral infarction  04/15/2018   Chest pain 05/19/2015   Depression    EEG abnormality without seizure 05/19/2021   Encephalitis 04/30/2021   Encephalopathy due to COVID-19 virus    Episodic confusion 05/19/2021   Heart murmur    Hyperglycemia 05/19/2015   Hypertension    Left thalamic lacunar infarct 05/24/2018   OSA (obstructive sleep apnea)    cpap 9 cm H2O   Recurrent unilateral inguinal hernia with obstruction and without gangrene    SBO (small bowel obstruction)    Sleep apnea 3 years ago   Ulcerative colitis 09/24/2019   Vascular dementia without behavioral disturbance 05/19/2021    Past Surgical History:  Procedure Laterality Date   BIOPSY  06/21/2017   Procedure: BIOPSY;  Surgeon: Malissa Hippo, MD;  [facility];  Service: Endoscopy;;  rectal, righta and left colon;   COLONOSCOPY N/A 06/21/2017   Procedure: COLONOSCOPY;  Surgeon: Malissa Hippo, MD;  [facility];  Service: Endoscopy;  Laterality: N/A;  1:25   COLONOSCOPY WITH PROPOFOL N/A 08/31/2022   Procedure: COLONOSCOPY WITH PROPOFOL;  Surgeon: Dolores Frame, MD;  [facility];  Service: Gastroenterology;  Laterality: N/A;  1245 ASA 2   HERNIA REPAIR  3 years ago   INGUINAL HERNIA REPAIR Right 04/04/2018   Procedure: HERNIA REPAIR INGUINAL ADULT WITH MESH;  Surgeon: Franky Macho, MD;  [facility]: [facility];  Service: General;  Laterality: Right;   INGUINAL HERNIA REPAIR Right 01/31/2019   Procedure: RECURRENT RIGHT  INGUINAL HERNIORRHAPY WITH MESH, INCARCERATED;  Surgeon: Franky Macho, MD;  Location: [facility];  Service: General;  Laterality: Right;   JOINT REPLACEMENT  July 19, 2021   Total knee repl.   KNEE SURGERY     tore mcl in high school (70's)   TOTAL KNEE ARTHROPLASTY Left 07/19/2021   Procedure: TOTAL KNEE ARTHROPLASTY;  Surgeon: Ollen Gross, MD;  [facility]: [facility];  Service: Orthopedics;  Laterality: Left;    Current Outpatient Medications  Medication Sig Dispense  Refill   Ascorbic Acid (VITAMIN C) 500 MG CAPS Take 500 mg by mouth daily.     aspirin EC 81 MG tablet Take 81 mg by mouth daily. Swallow whole.     atorvastatin (LIPITOR) 40 MG tablet TAKE ONE TABLET BY MOUTH AT 6PM 90 tablet 1   CVS B-12 500 MCG tablet Take 500 mcg by mouth daily.     diphenhydrAMINE (BENADRYL) 25 MG tablet Take 25 mg by mouth daily as needed for allergies.     donepezil (ARICEPT) 5 MG tablet Take 1 tablet (5 mg total) by mouth at bedtime. 90 tablet 1   folic acid (FOLVITE) 1 MG tablet TAKE 1 TABLET BY MOUTH EVERY DAY 90 tablet 3   losartan (COZAAR) 100 MG tablet TAKE 1 TABLET BY MOUTH EVERY DAY 90 tablet 3   pantoprazole (PROTONIX) 40 MG tablet TAKE 1 TABLET BY MOUTH EVERY DAY 90 tablet 1   sulfaSALAzine (AZULFIDINE) 500 MG tablet TAKE 2 TABLETS BY MOUTH TWICE A DAY 360 tablet 3   No current facility-administered medications for this visit.    Allergies as of 12/19/2023   (No Known Allergies)    Family History  Problem Relation Age of Onset   Tuberculosis Mother    Stroke Mother    Arthritis Mother    Cancer Mother    Heart disease Father        died at 41   Early death Father    Bradycardia Brother        pacemaker   Stroke Brother    Hypertension Brother    Mental illness Paternal Grandfather        suicide at 53   Suicidality Paternal Grandfather    Cancer Paternal Uncle    Dementia Maternal Uncle 4   Colon cancer Neg Hx     Social History   Socioeconomic History   Marital status: Married    Spouse name: Justin [name]   Number of children: Not on file   Years of education: 12   Highest education level: 12th grade  Occupational History   Occupation: Unemployed/retired    Comment: Personnel officer  Tobacco Use   Smoking status: Never    Passive exposure: Past   Smokeless tobacco: Never  Vaping Use   Vaping status: Never Used  Substance and Sexual Activity   Alcohol use: No   Drug use: No   Sexual activity: Yes    Birth control/protection: None   Other Topics Concern   Not on file  Social History Narrative   Lives with wife   Social Drivers of Health   Financial Resource Strain: Low Risk  (08/30/2023)   Overall Financial Resource Strain (CARDIA)    Difficulty of Paying Living Expenses: Not hard at all  Food Insecurity: No Food Insecurity (08/30/2023)   Hunger Vital Sign    Worried About Running Out of Food in the Last Year: Never true    Ran Out of Food in the Last Year: Never true  Transportation  Needs: No Transportation Needs (08/30/2023)   PRAPARE - Administrator, Civil Service (Medical): No    Lack of Transportation (Non-Medical): No  Physical Activity: Insufficiently Active (08/30/2023)   Exercise Vital Sign    Days of Exercise per Week: 2 days    Minutes of Exercise per Session: 30 min  Stress: No Stress Concern Present (08/30/2023)   [employer] of Occupational Health - Occupational Stress Questionnaire    Feeling of Stress : Not at all  Social Connections: Socially Integrated (08/30/2023)   Social Connection and Isolation Panel [NHANES]    Frequency of Communication with Friends and Family: Three times a week    Frequency of Social Gatherings with Friends and Family: Three times a week    Attends Religious Services: 1 to 4 times per year    Active Member of Clubs or Organizations: Yes    Attends Banker Meetings: 1 to 4 times per year    Marital Status: Married    Review of systems General: negative for malaise, night sweats, fever, chills, weight loss Neck: Negative for lumps, goiter, pain and significant neck swelling Resp: Negative for cough, wheezing, dyspnea at rest CV: Negative for chest pain, leg swelling, palpitations, orthopnea GI: denies melena, hematochezia, nausea, vomiting, diarrhea, constipation, dysphagia, odyonophagia, early satiety or unintentional weight loss.  MSK: Negative for joint pain or swelling, back pain, and muscle pain. Derm: Negative for itching or  rash Psych: Denies depression, anxiety, memory loss, confusion. No homicidal or suicidal ideation.  Heme: Negative for prolonged bleeding, bruising easily, and swollen nodes. Endocrine: Negative for cold or heat intolerance, polyuria, polydipsia and goiter. Neuro: negative for tremor, gait imbalance, syncope and seizures. The remainder of the review of systems is noncontributory.  Physical Exam: BP (!) 146/87 (BP Location: Left Arm, Patient Position: Sitting, Cuff Size: Normal)   Pulse (!) 59   Temp (!) 97.1 F (36.2 C) (Temporal)   Ht 6\' 2"  (1.88 m)   Wt 171 lb 1.6 oz (77.6 kg)   BMI 21.97 kg/m  General:   Alert. No distress noted. Pleasant and cooperative.  Head:  Normocephalic and atraumatic. Eyes:  Conjuctiva clear without scleral icterus. Mouth:  Oral mucosa pink and moist. Good dentition. No lesions. Heart: Normal rate and rhythm, s1 and s2 heart sounds present.  Lungs: Clear lung sounds in all lobes. Respirations equal and unlabored. Abdomen:  +BS, soft, non-tender and non-distended. No rebound or guarding. No HSM or masses noted. Derm: No palmar erythema or jaundice Msk:  Symmetrical without gross deformities. Normal posture. Extremities:  Without edema. Neurologic:  Alert and oriented to person and place Psych:  Alert and cooperative. Normal mood and affect.  Invalid input(s): "6 MONTHS"   ASSESSMENT: RANKIN COOLMAN is a 68 y.o. male presenting today for follow up of pan ulcerative colitis and GERD  UC: UC appears well-controlled on sulfasalazine 1 g twice daily.  CRP and fecal calprotectin were normal in July 2024.  Patient denies diarrhea and wife reports he does not seem to be frequenting the restroom more than a couple of times per day.  Last colonoscopy in 2023 with recommendation to repeat again in 3 years.  Will update inflammatory markers today and continue with sulfasalazine 1 g twice daily with folic acid supplementation daily.  They should let me know if anything  changes with his bowel habits.  GERD: Appears well-managed on Protonix 40 mg daily.  Patient denies any GERD symptoms and wife has not noticed  any changes in patient's eating habits or any complaints from him as far as reflux.  Weight remains stable.   PLAN:  -CRP, fecal calprotectin -Continue sulfasalazine 1 g twice daily -Continue folic acid supplementation -Continue Protonix 40 mg daily  All questions were answered, patient verbalized understanding and is in agreement with plan as outlined above.   Follow Up: 6 months   Gwendalynn Eckstrom L. Jeanmarie Hubert, MSN, APRN, AGNP-C Adult-Gerontology Nurse Practitioner [facility]  I have reviewed the note and agree with the APP's assessment as described in this progress note  Katrinka Blazing, MD Gastroenterology and Hepatology [facility] Gastroenterology

## 2023-12-20 DIAGNOSIS — K5100 Ulcerative (chronic) pancolitis without complications: Secondary | ICD-10-CM | POA: Diagnosis not present

## 2023-12-21 LAB — C-REACTIVE PROTEIN: CRP: <3 mg/L (ref <8.0)

## 2023-12-23 ENCOUNTER — Other Ambulatory Visit (INDEPENDENT_AMBULATORY_CARE_PROVIDER_SITE_OTHER): Payer: Self-pay | Admitting: Gastroenterology

## 2023-12-23 DIAGNOSIS — K5100 Ulcerative (chronic) pancolitis without complications: Secondary | ICD-10-CM

## 2024-01-01 ENCOUNTER — Encounter (INDEPENDENT_AMBULATORY_CARE_PROVIDER_SITE_OTHER): Payer: Self-pay

## 2024-01-02 DIAGNOSIS — K5100 Ulcerative (chronic) pancolitis without complications: Secondary | ICD-10-CM | POA: Diagnosis not present

## 2024-01-03 DIAGNOSIS — G4733 Obstructive sleep apnea (adult) (pediatric): Secondary | ICD-10-CM | POA: Diagnosis not present

## 2024-01-09 ENCOUNTER — Other Ambulatory Visit: Payer: Self-pay | Admitting: Family Medicine

## 2024-01-09 DIAGNOSIS — F0150 Vascular dementia without behavioral disturbance: Secondary | ICD-10-CM

## 2024-01-09 DIAGNOSIS — I6310 Cerebral infarction due to embolism of unspecified precerebral artery: Secondary | ICD-10-CM

## 2024-01-09 DIAGNOSIS — I6381 Other cerebral infarction due to occlusion or stenosis of small artery: Secondary | ICD-10-CM

## 2024-01-09 LAB — CALPROTECTIN: Calprotectin: 74 ug/g

## 2024-01-24 ENCOUNTER — Ambulatory Visit: Payer: Medicare PPO | Admitting: Adult Health

## 2024-01-24 ENCOUNTER — Encounter: Payer: Self-pay | Admitting: Adult Health

## 2024-01-24 VITALS — BP 154/87 | HR 68 | Ht 72.0 in | Wt 170.4 lb

## 2024-01-24 DIAGNOSIS — G4733 Obstructive sleep apnea (adult) (pediatric): Secondary | ICD-10-CM | POA: Diagnosis not present

## 2024-01-24 DIAGNOSIS — F01B4 Vascular dementia, moderate, with anxiety: Secondary | ICD-10-CM | POA: Diagnosis not present

## 2024-01-24 DIAGNOSIS — G4709 Other insomnia: Secondary | ICD-10-CM | POA: Diagnosis not present

## 2024-01-24 DIAGNOSIS — Z8673 Personal history of transient ischemic attack (TIA), and cerebral infarction without residual deficits: Secondary | ICD-10-CM

## 2024-01-24 MED ORDER — CITALOPRAM HYDROBROMIDE 10 MG PO TABS: 10.0000 mg | ORAL_TABLET | Freq: Every day | ORAL | 5 refills | Status: DC

## 2024-01-24 MED ORDER — DONEPEZIL HCL 5 MG PO TABS: 5.0000 mg | ORAL_TABLET | Freq: Every day | ORAL | 3 refills | Status: AC

## 2024-01-24 NOTE — Patient Instructions (Signed)
 Your Plan:  Continue Aricept 5mg  but change to morning to see if this helps with insomnia   Start Celexa 10mg  daily - please call after a few weeks if dosage needs to be increased or sooner if difficulty tolerating   Start melatonin to help with insomnia - can gradually increase as needed to make of 12-15mg  per night  Continue use of CPAP with ensuring nightly use with greater than 4 hrs per night     Follow up in 6 months or call earlier if needed     Thank you for coming to see [place] at [facility]. I hope we have been able to provide you high quality care today.  You may receive a patient satisfaction survey over the next few weeks. We would appreciate your feedback and comments so that we may continue to improve ourselves and the health of our patients.    Citalopram Tablets What is this medication? CITALOPRAM (sye TAL oh pram) treats depression. It increases the amount of serotonin in the brain, a hormone that helps regulate mood. It belongs to a group of medications called SSRIs. This medicine may be used for other purposes; ask your health care provider or pharmacist if you have questions. COMMON BRAND NAME(S): Celexa What should I tell my care team before I take this medication? They need to know if you have any of these conditions: Bipolar disorder or a family history of bipolar disorder Bleeding disorder Glaucoma Heart disease History of irregular heartbeat Kidney disease Liver disease Low levels of magnesium or potassium in the blood Receiving electroconvulsive therapy Seizures Suicidal thoughts, plans, or attempt by you or a family member Take medications that treat or prevent blood clots Thyroid disease An unusual or allergic reaction to citalopram, other medications, foods, dyes, or preservatives Pregnant or trying to become pregnant Breastfeeding How should I use this medication? Take this medication by mouth with water. Take it as  directed on the prescription label at the same time every day. You can take it with or without food. Do not take your medication more often than directed. Do not stop taking this medication suddenly except upon the advice of your care team. Stopping this medication too quickly may cause serious side effects or your condition may worsen. A special MedGuide will be given to you by the pharmacist with each prescription and refill. Be sure to read this information carefully each time. Talk to your care team about the use of this medication in children. Special care may be needed. People 60 years and older may have a stronger reaction and need a smaller dose. Overdosage: If you think you have taken too much of this medicine contact a poison control center or emergency room at once. NOTE: This medicine is only for you. Do not share this medicine with others. What if I miss a dose? If you miss a dose, take it as soon as you can. If it is almost time for your next dose, take only that dose. Do not take double or extra doses. What may interact with this medication? Do not take this medication with any of the following: Certain medications for fungal infections, such as fluconazole, itraconazole, ketoconazole, posaconazole, voriconazole Cisapride Dronedarone Escitalopram Linezolid MAOIs, such as Carbex, Eldepryl, Marplan, Nardil, and Parnate Methylene blue (injected into a vein) Pimozide Thioridazine This medication may also interact with the following: Alcohol Amphetamines Aspirin and aspirin-like medications Carbamazepine Certain medications for infections, such as chloroquine, clarithromycin, erythromycin, furazolidone, isoniazid, pentamidine Certain medications  for mental health conditions Certain medications for migraine headaches, such as almotriptan, eletriptan, frovatriptan, naratriptan, rizatriptan, sumatriptan, zolmitriptan Certain medications for sleep Certain medications that treat or  prevent blood clots, such as dalteparin, enoxaparin, warfarin Cimetidine Diuretics Dofetilide Fentanyl Lithium Methadone Metoprolol NSAIDs, medications for pain and inflammation, such as ibuprofen or naproxen Omeprazole Other medications that cause heart rhythm changes Procarbazine Rasagiline Supplements, such as St. John's wort, kava kava, valerian Tramadol Tryptophan Ziprasidone This list may not describe all possible interactions. Give your health care provider a list of all the medicines, herbs, non-prescription drugs, or dietary supplements you use. Also tell them if you smoke, drink alcohol, or use illegal drugs. Some items may interact with your medicine. What should I watch for while using this medication? Tell your care team if your symptoms do not get better or if they get worse. Visit your care team for regular checks on your progress. Because it may take several weeks to see the full effects of this medication, it is important to continue your treatment as prescribed. This medication may cause thoughts of suicide or depression. This includes sudden changes in mood, behaviors, or thoughts. These changes can happen at any time but are more common in the beginning of treatment or after a change in dose. Call your care team right away if you experience these thoughts or worsening depression. This medication may cause mood and behavior changes, such as anxiety, nervousness, irritability, hostility, restlessness, excitability, hyperactivity, or trouble sleeping. These changes can happen at any time but are more common in the beginning of treatment or after a change in dose. Call your care team right away if you notice any of these symptoms. This medication may affect your coordination, reaction time, or judgment. Do not drive or operate machinery until you know how this medication affects you. Sit up or stand slowly to reduce the risk of dizzy or fainting spells. Drinking alcohol with  this medication can increase the risk of these side effects. Your mouth may get dry. Chewing sugarless gum or sucking hard candy and drinking plenty of water may help. Contact your care team if the problem does not go away or is severe. What side effects may I notice from receiving this medication? Side effects that you should report to your care team as soon as possible: Allergic reactions--skin rash, itching, hives, swelling of the face, lips, tongue, or throat Bleeding--bloody or black, tar-like stools, red or dark brown urine, vomiting blood or brown material that looks like coffee grounds, small, red or purple spots on skin, unusual bleeding or bruising Heart rhythm changes--fast or irregular heartbeat, dizziness, feeling faint or lightheaded, chest pain, trouble breathing Low sodium level--muscle weakness, fatigue, dizziness, headache, confusion Serotonin syndrome--irritability, confusion, fast or irregular heartbeat, muscle stiffness, twitching muscles, sweating, high fever, seizure, chills, vomiting, diarrhea Sudden eye pain or change in vision such as blurry vision, seeing halos around lights, vision loss Thoughts of suicide or self-harm, worsening mood, feelings of depression Side effects that usually do not require medical attention (report to your care team if they continue or are bothersome): Change in sex drive or performance Diarrhea Dry mouth Excessive sweating Nausea Tremors or shaking Upset stomach This list may not describe all possible side effects. Call your doctor for medical advice about side effects. You may report side effects to FDA at 1-800-FDA-1088. Where should I keep my medication? Keep out of reach of children and pets. Store at room temperature between 15 and 30 degrees C (59 and  86 degrees F). Throw away any unused medication after the expiration date. NOTE: This sheet is a summary. It may not cover all possible information. If you have questions about this  medicine, talk to your doctor, pharmacist, or health care provider.  2024 [employer] Standard (2022-07-19 00:00:00)

## 2024-01-24 NOTE — Progress Notes (Signed)
 Provider:  Melvyn Novas, [name]  Primary Care Physician:  Donita Brooks, MD  Reason for visit: Stroke, memory, CPAP   Chief Complaint  Patient presents with   Follow-up    Pt in room 3.wife in room. Here for memory follow up. Wife pt has good days and bad days. Has some confusion at times, but no decline in memory.     HPI:   Update 01/24/2024 JM: Patient returns for 12-month follow-up visit accompanied by his wife.  Has been relatively stable since prior visit without any significant decline in memory.  Can have good days and bad days.  Does better when he stays with the same routine every day but can have increased confusion and anxiety if his routine shift change such as doctor appointments.  He can also have increased anxiety and a large crowd of people or if he is hearing multiple conversations at once.  He feels like he constantly has to be busy and moving, he continues to go out to his shop and usually finds things to keep him busy.  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  Reports he sleeps well and has a good appetite.  No significant behavioral concerns.  Prior concerns of increased confusion, agitation and frustration greatly improved after lowering donepezil dosage.  He is currently on donepezil 5 mg nightly and seems to be tolerating well.   He has been struggling with routine CPAP compliance as at times he may fall asleep early in a chair watching TV and can wake up several hours later, sometimes will take him a while to go back to sleep or other times he will go right into his bed and fall asleep prior to placing CPAP mask on.  He can also get up in the middle the night to use the restroom and will not remember to place his CPAP back on.  He does have difficulty sleeping at night at times.  Compliance report over the past 30 days shows 30 out of 30 usage days with 20 days  greater than 4 hours for 67% compliance.  Average usage 4 hours and 14 minutes.  Residual AHI 1.2 on set pressure of 9 with EPR 3.  Leaks in the 95th percentile 21.6.        History provided for reference purposes only Update 07/04/2023 JM: Patient returns for initial CPAP compliance visit after receiving new machine as well as follow-up on stroke and memory.  Repeated HST 04/12/2023 which confirmed mild to moderate OSA, received new CPAP 05/03/2023.  Compliance report over the past 30 days shows 30 out of 30 usage days with 28 days greater than 4 hours for 93% compliance.  Residual AHI 0.7 on set pressure of 9 and EPR level 3.  Leaks in the 95th percentile 29.8.  Reports tolerating CPAP well. ESS 6/24. DME [facility].   In regards to cognition, wife sent MyChart message prior to visit reporting decline of memory, confusion, increased agitation and frustration.  Became agitated when wife told him he could not drive (has not drove in the past couple of years). MMSE today 14/30 (prior 18/30 03/2023 and 21/30 09/2022).  At prior visit with Dr. Vickey Huger in May, she recommended increasing Aricept dosage due to memory decline, currently taking 10mg  nightly, but wife questions if this has contributed to worsening behaviors and confusion.  Continues  to have increased anxiety with overstimulation. Wife also mentions more recently during conversations, he will speak off topic or say odd words that do not fit with what he is speaking about.  Previously on Lexapro for anxiety/depression but discontinued last year by PCP as patient didn't feel it was needed.   Is any other stroke/TIA symptoms or neurological symptoms.  Compliant on aspirin and atorvastatin.  Routinely follows with PCP for stroke risk factor management.  Update 09/14/2022 JM: Patient returns for 74-month follow-up.  He is accompanied by his wife.  Reports cognition has been stable since prior visit per both patient and wife. Can have good days and bad  days.  No longer works.  MMSE today 21/30 (prior 18/30). Remains on Aricept.  PCP discontinued Lexapro as no longer having issues with anxiety/depression, has been doing well since stopping.  Denies any new stroke/TIA symptoms. Compliant on aspirin and atorvastatin.  Blood pressure well controlled.  In regards to sleep apnea, review of compliance report shows adequate usage with 29 out of 30 usage days and 23 days greater than 4 hours for 77% compliance.  Optimal residual AHI of 1.9.  Set pressure 9 with EPR level 3.  Leaks in the 95th percentile 15.1.  Continues to tolerate CPAP well.  Can have some nights where he has difficulty sleeping but does not happen routinely.  He is up-to-date with supplies routinely follows with DME company.   Update 03/14/2022 JM: Patient returns for stroke and dementia follow-up after prior visit 09/27/2021.  He is accompanied by his wife.  Vascular dementia:  Hx of stroke:  Per patient Does have issues with short term memory - believes some decline since prior visit Is able to maintain majority ADLs independently but wife does need to pick out clothes as it may take him too long Does have difficulty focusing or may lose train of thought or get distracted easily Reports he is still working and going to job sites. Can get frustrated due to waiting for materials to come in which can take 2-3 weeks and can hold up completion of job (per wife, not currently working) He tries to stay busy, works around his yard and likes to Warehouse manager prescribing  Per wife can have good days/bad days. At times, very clear and no difficulty but other days will have great difficulty doing simple tasks or constantly repeating questions Will start certain tasks and will move on to a different task before completing No issues with being distracted or staying focused prior to stroke Will have difficulty finding the correct words or say the incorrect word (at times will realize he  used incorrect word) Can have occasional agitation more so when feeling overwhelmed in crowded areas Typically does well if stays with same daily routine Mentions exaggerated laughing such as when watching a TV show that he has seen before. No inappropriate laughing but laughs excessively at things that may not be that funny to others. Denies uncontrollable laughter or any uncontrollable or inappropriate crying. 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 Thankfully, wife was able to find him before doing any work and he was able to be brought home safely. This has not reoccurred since then.  They now have locations set up on cell phone.   MMSE today  18/30 (prior 20/30)  He has remained on Aricept 5 mg daily and Lexapro 20 mg daily.  Denies side effects.  Did have repeat MRI 3/22 after presenting to ED with left facial swelling which was negative for acute findings.  Compliant on aspirin and atorvastatin, denies side effects.  Blood pressure today 141/76.     OSA on CPAP:  Past 30-day compliance report shows 30 out of 30 usage days with 21 days greater than 4 hours for 70% compliance.  Average usage 4 hours and 29 minutes.  Residual AHI 1.6 on set pressure of 9.  Leaks in the 95 percentile 9.9. does have some issues sleeping at times, can become uncomfortable in bed and will move to recliner without his CPAP. If wife wakes up when he is in the recliner, she will wake him up to go back to bed to use CPAP.  He has no complaints regarding CPAP.    Update 09/27/2021 JM: Returns for 25-month follow-up accompanied by his wife who provides majority of history  Vascular dementia: Stable per wife - can have increased difficulty with overstimulation or with increased anxiety He has not worked since May  2022. He has also refrain from driving - wife questions his ability to return to driving as he is requesting to do so MMSE today 20/30 (prior 16/30 05/2021) -previously completed during f/u visit with Dr. Vickey Huger for hospital follow-up after acute mental status change on 04/30/2021 possibly in setting of encephalopathy due to autoimmune encephalitis vs less likely seizures (see OV note from 05/19/2021). Repeat MRI showed unchanged abnormality in anterior left temporal lobe possibly remote infarct vs encephalitis vs low-grade glioma (per report).  Repeat EEG 06/09/2021 no evidence of seizure activity and slowly discontinue Keppra. Dr. Vickey Huger initiated Aricept 5 mg daily which he has been tolerating without side effects Eval by Dr. Milbert Coulter 06/2021 with likely vascular dementia likely exacerbated by history of COVID-19 and acute metabolic encephalopathy due to suspected encephalitis. Advised he refrain from driving and working. Recommended repeat eval in 12 months  No behavioral concerns   OSA on CPAP: 30-day compliance report shows 30 out of 30 usage days with only 7 days greater than 4 hours with average usage 3 hours and 0 minutes.  Residual AHI 1.3.  Leaks in the 95th percentile 13.9.  Set pressure 9 and EPR level 3. Continues to tolerate well but will wake up in the middle of the night to either go to the bathroom or to sit in recliner and upon returning back to bed, he will forget to put back on. Up to date on supplies provided through [facility].  Previously discussed inspire device - not a good candidate due to needing anesthesia (per Dr. Vickey Huger).    History of stroke: Stable in regards to stroke aspect.  Denies new stroke/TIA symptoms.  Compliant on aspirin and atorvastatin -denies side effects.  Blood pressure today 136/74.     Other  S/p left total knee arthroplasty 07/19/2021 - working with PT - has 2 more sessions. Wife mentioned physical therapist recommended cognitive therapy initially but  unsure if they still recommend this.  She plans on further discussing at next visit.    No further concerns at this time   Update 03/29/2021 JM:   Mr. Blasingame returns for 57-month stroke and sleep apnea follow-up accompanied by his wife.  Wife sent MyChart message prior to todays visit with concerns regarding continued decline in his cognition especially with short-term memory and processing of information.  Remains on Lexapro for prior increased agitation and irritation.  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  This concerns his wife as she does not feel he should be still working in setting of his continued cognitive decline.  MMSE today 24/30 (prior 22/30).  Of note, he was diagnosed with COVID-19 12/03/2020 and prior COVID 19 06/2020. He did recover well from most recent case and does not believe any worsening of cognition denies residual deficits.  Compliant on aspirin and atorvastatin without associated side effects.  Blood pressure today 111/70.   CPAP compliance report from the past 30 days shows 30 out of 30 usage days but only 15 days greater than 4 hours with average usage 4 hours and 2 minutes.  Residual AHI 0.9.  Per wife, will sleep for a couple hours prior to going to bed in recliner then will sleep in bed for 2-4 hours then go back to his recliner and sleep additional time.  He questions possible use of inspire device  Plans on undergoing knee replacement - sees ortho Thursday to discuss further   No further concerns at this time   Update 09/28/2020 JM: Mr. Tapp returns for 40-month follow-up regarding history of stroke and cognitive decline.  Since prior visit, he was diagnosed with COVID 8/30 and wife has noticed slight worsening of cognition since that time but overall stable. MMSE today 22/30 (prior 22/30). MRI, EEG and lab work all unremarkable for reversible causes or abnormality. 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 Wife has been assisting with this. Does report occasional increased agitation or irritation but has been able to better manage this. Currently on lexapro 20mg  daily. Denies new or recurrent stroke/TIA symptoms remaining on aspirin 325 mg daily and atorvastatin 40 mg daily without side effects.  Blood pressure today 110/68. History of OSA on CPAP with difficulty recently due to possibly walking pneumonia recently completing treatment. Review of compliance report from 08/29/2020 -09/27/2020 shows 29 out of 30 usage days for 97% compliance with only 19 days greater than 4 hours for 63% compliance. Average usage 4 hours and 18 minutes. Residual AHI 2.0 on set pressure 9 cm H2O and EPR level 3. He does report slowly increasing his tolerance as he had difficulty using towards the end of October and beginning of November. Continues to follow with DME company [facility] for any needed supplies. No further concerns.  Update 05/18/2020 JM: Mr. Passey returns for follow-up accompanied by his wife. Prior concerns of short-term memory loss gradually worsening since his stroke in 05/2018.  Evidence of depression/anxiety and initiated Lexapro 10 mg daily with improvement.  He does continue to experience occasional agitation/irritability as well as occasional short-term memory deficit as well as difficulty in multitasking and focusing.  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  He also reports intermittent word finding difficulty which has been more noticeable per wife.  He does report increased fatigue taking naps after work as well as difficulty with insomnia.  Ongoing compliance with CPAP for OSA management -download report told to ensure optimal residual AHI which  showed excellent compliance with residual AHI 0.9.  Continues on aspirin 325 mg daily and  atorvastatin 40 mg daily for secondary stroke prevention.  Blood pressure today satisfactory 125/77.  No further concerns at this time.  Update 01/14/2020 JM: Mr. Klang is a 68 year old male presenting today, 01/14/2020 for a follow up with his wife present. He was last seen on 09/26/2019 for worsening short-term memory loss, which he and his wife feel has improved. He was started on lexapro 10mg  due to concerns of underlying depression/anxiety affecting memory with improvement of underlying depression/anxiety.  Most recent MMSE 26/30 on 12/25/2019 with prior 23/30.  He was previously being followed in this office due to left thalamic stroke in 04/2018 with residual right hemisensory impairment. He has continued on aspirin and atorvastatin for secondary stroke prevention. Overall patient and wife feel like his depression, anxiety and memory impairment have improved.  Recently had follow-up with Maralyn Sago, NP for sleep apnea and endorses ongoing compliance.  Continues on aspirin and atorvastatin for secondary stroke prevention. Blood pressure today satisfactory 125/79. No further concerns at this time.   Update 09/26/2019: Mr. Sikorski is a 68 year old male who is being seen per patient/wife request due to worsening short-term memory loss.  He was previously being followed in this office due to left thalamic stroke in 04/2018 with residual right hemisensory impairment.  He has continued on aspirin and atorvastatin for secondary stroke prevention.  Recent lipid panel showed LDL 61.  Wife has been observing occasional short-term memory concerns over the past few months.  He has continued working and functioning without difficulty but he will occasionally forget certain conversations, triple check to ensure he is completed different tasks and forgetting certain dates.  He does endorse increased stress over the past few months at work where he  has no disease last patient with his workers and increased irritability.  He is also been experiencing worsening insomnia and has been having greater difficulty tolerating CPAP mask.  He does have a prior history of anxiety.  He was previously treated on Xanax several years prior. MMSE 23/30 greatest in recall, repetition, and drawing.  GAD-7 score 4.  PHQ 9 score 5.  After further discussing possible depression/anxiety causing concerns of memory, wife has noticed patient being more depressed recently.  Reviewing his medication list, he will take Benadryl occasionally as needed to help with sleep.  Interval history 08/23/2018: Patient returns today for stroke follow-up visit.  He did undergo 30-day cardiac monitor which did show episode of V. tach with heart rate 178 but negative for atrial fibrillation.  Upon initial evaluation in the ED, he reported numbness of the right face, right upper extremity and right lower extremity.  Patient does state that he continues to have right upper extremity numbness that is constant with a "stiff and tight feeling".  He has continued to work despite his continued numbness but does have intermittent difficulties as he is right-hand dominant.  He also does have some memory concerns stating that he is not as "sharp" as he previously was and will ask the same questions or will forget certain directions.  He continues to take aspirin without side effects of bleeding or bruising.  Continues to take Lipitor without side effects myalgias.  Blood pressure today satisfactory 135/89.  He does continue to state compliance with CPAP but has been finding himself taking the mask off in the middle the night.  He does have a follow-up in this office for OSA management on 09/13/2018.  No further concerns at this time.  Denies new or worsening stroke/TIA symptoms.  05/24/2018 visit  CD: Seen in STROKE follow up -Mr. Ennis Heavner is a 68 year old male patient whom I had initially seen earlier this  year for evaluation of sleep apnea unfortunately the patient underwent a right-sided inguinal hernia repair for which she had to discontinue his full size aspirin 5 days prior.  Also aspirin was removed within 2 or 3 days post surgery he did suffer a stroke.  And his main symptom was right-sided numbness and clumsiness sensory loss of the right dominant hand.  I see him today after he has undergone a stroke work-up in the hospital which included a head CT without contrast on 7 June, an MRI MRA of head and neck, the findings were an acute lacunar infarct in the left lateral thalamus there was no hemorrhage, he had negative abnormalities he was negative for abnormalities in the vascular tree his neck MRA showed mild atherosclerosis at the origins of the left internal carotid artery and right subclavian artery but not significant stenosis and he had very mild for age nonspecific white matter signal changes these are commonly seen the small vessel disease there was no brain atrophy noted.  He had presented to the hospital ED with elevated blood pressures which may explain the lacunar appearance of a previous stroke.  Head CT was negative.  Onset of symptoms was on 13 April 2018 he was evaluated by Dr. Caryl Pina neurologist on-call, who also mentioned that the patient has a history of obstructive sleep apnea and is currently treated at 9 cmH2O pressure.  The patient restarted on aspirin metabolic panels were normal his creatinine was 1.05 his BUN 24 which may indicate that he was slightly dehydrated, his CBC was normal but that the count was 9.6K hemoglobin 15.1 hematocrit 50.1 without any evidence of anemia normal platelet count 350 3K.  Cardiac enzymes were negative his total cholesterol was actually rather low at 133 ng but is good "" cholesterol HDL was only 30.      Review of Systems: Out of a complete 14 system review, the patient complains of only the following listed in HPI, and all other reviewed systems  are negative.    Social History   Socioeconomic History   Marital status: Married    Spouse name: Burna Mortimer   Number of children: Not on file   Years of education: 12   Highest education level: 12th grade  Occupational History   Occupation: Unemployed/retired    Comment: Personnel officer  Tobacco Use   Smoking status: Never    Passive exposure: Past   Smokeless tobacco: Never  Vaping Use   Vaping status: Never Used  Substance and Sexual Activity   Alcohol use: No   Drug use: No   Sexual activity: Yes    Birth control/protection: None  Other Topics Concern   Not on file  Social History Narrative   Lives with wife   Social Drivers of Health   Financial Resource Strain: Low Risk  (08/30/2023)   Overall Financial Resource Strain (CARDIA)    Difficulty of Paying Living Expenses: Not hard at all  Food Insecurity: No Food Insecurity (08/30/2023)   Hunger Vital Sign    Worried About Running Out of Food in the Last Year: Never true    Ran Out of Food in the Last Year: Never true  Transportation Needs: No Transportation Needs (08/30/2023)   PRAPARE - Administrator, Civil Service (Medical): No    Lack of Transportation (Non-Medical): No  Physical Activity: Insufficiently Active (08/30/2023)  Exercise Vital Sign    Days of Exercise per Week: 2 days    Minutes of Exercise per Session: 30 min  Stress: No Stress Concern Present (08/30/2023)   [employer] of Occupational Health - Occupational Stress Questionnaire    Feeling of Stress : Not at all  Social Connections: Socially Integrated (08/30/2023)   Social Connection and Isolation Panel [NHANES]    Frequency of Communication with Friends and Family: Three times a week    Frequency of Social Gatherings with Friends and Family: Three times a week    Attends Religious Services: 1 to 4 times per year    Active Member of Clubs or Organizations: Yes    Attends Banker Meetings: 1 to 4 times per year     Marital Status: Married  Catering manager Violence: Not At Risk (08/30/2023)   Humiliation, Afraid, Rape, and Kick questionnaire    Fear of Current or Ex-Partner: No    Emotionally Abused: No    Physically Abused: No    Sexually Abused: No    Family History  Problem Relation Age of Onset   Tuberculosis Mother    Stroke Mother    Arthritis Mother    Cancer Mother    Heart disease Father        died at 93   Early death Father    Bradycardia Brother        pacemaker   Stroke Brother    Hypertension Brother    Mental illness Paternal Grandfather        suicide at 65   Suicidality Paternal Grandfather    Cancer Paternal Uncle    Dementia Maternal Uncle 78   Colon cancer Neg Hx     Past Medical History:  Diagnosis Date   Acid reflux 04/27/2021   Acute metabolic encephalopathy 04/30/2021   Anxiety    Arthritis    Cerebral embolism with cerebral infarction 04/15/2018   Chest pain 05/19/2015   Depression    EEG abnormality without seizure 05/19/2021   Encephalitis 04/30/2021   Encephalopathy due to COVID-19 virus    Episodic confusion 05/19/2021   Heart murmur    Hyperglycemia 05/19/2015   Hypertension    Left thalamic lacunar infarct 05/24/2018   OSA (obstructive sleep apnea)    cpap 9 cm H2O   Recurrent unilateral inguinal hernia with obstruction and without gangrene    SBO (small bowel obstruction)    Sleep apnea 3 years ago   Ulcerative colitis 09/24/2019   Vascular dementia without behavioral disturbance 05/19/2021    Past Surgical History:  Procedure Laterality Date   BIOPSY  06/21/2017   Procedure: BIOPSY;  Surgeon: Malissa Hippo, MD;  Location: [facility];  Service: Endoscopy;;  rectal, righta and left colon;   COLONOSCOPY N/A 06/21/2017   Procedure: COLONOSCOPY;  Surgeon: Malissa Hippo, MD;  Location: [facility];  Service: Endoscopy;  Laterality: N/A;  1:25   COLONOSCOPY WITH PROPOFOL N/A 08/31/2022   Procedure: COLONOSCOPY WITH PROPOFOL;   Surgeon: Dolores Frame, MD;  Location: [facility];  Service: Gastroenterology;  Laterality: N/A;  1245 ASA 2   HERNIA REPAIR  3 years ago   INGUINAL HERNIA REPAIR Right 04/04/2018   Procedure: HERNIA REPAIR INGUINAL ADULT WITH MESH;  Surgeon: Franky Macho, MD;  Location: [facility];  Service: General;  Laterality: Right;   INGUINAL HERNIA REPAIR Right 01/31/2019   Procedure: RECURRENT RIGHT INGUINAL HERNIORRHAPY WITH MESH, INCARCERATED;  Surgeon: Franky Macho, MD;  Location: [facility];  Service: General;  Laterality: Right;   JOINT REPLACEMENT  July 19, 2021   Total knee repl.   KNEE SURGERY     tore mcl in high school (70's)   TOTAL KNEE ARTHROPLASTY Left 07/19/2021   Procedure: TOTAL KNEE ARTHROPLASTY;  Surgeon: Ollen Gross, MD;  Location: [facility];  Service: Orthopedics;  Laterality: Left;    Current Outpatient Medications  Medication Sig Dispense Refill   Ascorbic Acid (VITAMIN C) 500 MG CAPS Take 500 mg by mouth daily.     aspirin EC 81 MG tablet Take 81 mg by mouth daily. Swallow whole.     atorvastatin (LIPITOR) 40 MG tablet TAKE ONE TABLET BY MOUTH AT 6PM 90 tablet 1   CVS B-12 500 MCG tablet Take 500 mcg by mouth daily.     diphenhydrAMINE (BENADRYL) 25 MG tablet Take 25 mg by mouth daily as needed for allergies.     donepezil (ARICEPT) 5 MG tablet Take 1 tablet (5 mg total) by mouth at bedtime. 90 tablet 1   folic acid (FOLVITE) 1 MG tablet TAKE 1 TABLET BY MOUTH EVERY DAY 90 tablet 3   losartan (COZAAR) 100 MG tablet TAKE 1 TABLET BY MOUTH EVERY DAY 90 tablet 3   pantoprazole (PROTONIX) 40 MG tablet Take 1 tablet (40 mg total) by mouth daily. 90 tablet 3   sulfaSALAzine (AZULFIDINE) 500 MG tablet TAKE 2 TABLETS BY MOUTH TWICE A DAY 360 tablet 3   No current facility-administered medications for this visit.    Allergies as of 01/24/2024   (No Known Allergies)    Vitals: Today's Vitals   01/24/24 1054  BP: (!) 154/87  Pulse: 68  Weight: 170 lb 6.4 oz  (77.3 kg)  Height: 6' (1.829 m)   Body mass index is 23.11 kg/m.  Physical exam: General: well developed, well nourished, very pleasant middle-age Caucasian male, seated, in no evident distress Head: head normocephalic and atraumatic.   Neck: supple with no carotid or supraclavicular bruits Cardiovascular: irregular rhythm, no murmurs Skin:  no rash/petichiae Vascular:  Normal pulses all extremities   Neurologic Exam Mental Status: Awake and fully alert.   did have some speech hesitancy when speaking too quickly but unable to appreciate aphasia or dysarthria.   Recent memory impaired and remote memory intact. Attention span, concentration and fund of knowledge impaired.  Mood and affect appropriate.  MMSE below.  Cranial Nerves:  Pupils equal, briskly reactive to light. Extraocular movements full without nystagmus. Visual fields full to confrontation. Hearing intact. Facial sensation intact. Face, tongue, palate moves normally and symmetrically.  Motor: Normal bulk and tone. Normal strength in all tested extremity muscles. Sensory.: intact to touch , pinprick , position and vibratory sensation.  Coordination: Rapid alternating movements normal in all extremities. Finger-to-nose and heel-to-shin performed accurately bilaterally. Gait and Station: Arises from chair without difficulty. Stance is normal. Gait demonstrates normal stride length and balance without use of assistive device Reflexes: 1+ and symmetric. Toes downgoing.        01/24/2024   11:00 AM 07/04/2023   10:06 AM 03/08/2023   12:45 PM 09/14/2022    1:35 PM 03/14/2022    2:22 PM 09/27/2021    9:05 AM 05/19/2021    3:27 PM  MMSE - Mini Mental State Exam  Orientation to time 1 0 3 4 3 3 2   Orientation to Place 5 4 4 5 4 4 4   Registration 3 3 3 3 3 3 3   Attention/ Calculation  0 0 1 1 0 1 1  Recall 0 0 0 0 0 0 0  Language- name 2 objects 2 2 2 2 2 2 2   Language- repeat 1 1 1 1 1 1 1   Language- follow 3 step command 3 3 3 3 3  3 2   Language- read & follow direction 1 0 0 1 1 1  0  Write a sentence 1 1 1 1 1 1 1   Copy design 0 0 0 0 0 1 0  Total score 17 14 18 21 18 20 16         Assessment/plan: Essie Gehret is a 68 year old male with PMHx of left thalamic stroke 04/2018 likely secondary to small vessel disease, vascular dementia, HTN, HLD, OSA, COVID 19 x2, and metabolic encephalopathy likely in setting of encephalitis 04/2021    1.  Vascular dementia 2.  Cognitive decline 3.  Anxiety -MMSE 17/30 (prior 14/30) -Continue donepezil 5 mg, recommend changing from nighttime to morning as this could possibly be contributing to insomnia, intolerant to 10 mg dosing -Recommend initiating citalopram 10 mg daily -Recommend trialing melatonin with gradual titration to max of 12 to 15 mg nightly -MRI brain 08/2023 no acute abnormalities (with acute cognitive decline, confusion and agitation, suspect due to higher donepezil dosage which has since greatly improved) -Neurocognitive eval 07/06/2021 - likely vascular dementia with prior stroke hx likely exacerbated by history of COVID-19 and acute metabolic encephalopathy due to suspected encephalitis -Discussed importance of memory exercises, managing stroke risk factors, healthy diet, routine exercise and adequate sleep.     2. History of stroke -Continue aspirin 81 mg daily and atorvastatin 40 mg daily for secondary stroke prevention -Discussed secondary stroke prevention measures and importance of close follow-up with PCP for HTN and HLD management    3.  Obstructive sleep apnea -Repeat HST 04/2023 confirmed mild to moderate OSA, received new CPAP 04/2023 -Discussed importance of nightly CPAP usage and ensuring greater than 4 hours per night for optimal benefit -Continue to follow with D[facility] for any needed supplies or CPAP related concerns -Per Dr. Vickey Huger, not a candidate for hypoglossal nerve stimulator due to this requiring anesthesia     Follow-up in 6  months or call earlier if needed    CC:  Donita Brooks, MD     I spent 30 minutes of face-to-face and non-face-to-face time with patient and wife.  This included previsit chart review, lab review, study review, order entry, electronic health record documentation, patient and wife education and discussion regarding above diagnoses and treatment plan and answered all other questions to patient wife satisfaction  Ihor Austin, AGNP-BC  [facility] [address] [place], [place] [address]  Phone [phone] Fax [phone] Note: This document was prepared with digital dictation and possible smart phrase technology. Any transcriptional errors that result from this process are unintentional.

## 2024-01-31 DIAGNOSIS — G4733 Obstructive sleep apnea (adult) (pediatric): Secondary | ICD-10-CM | POA: Diagnosis not present

## 2024-02-16 ENCOUNTER — Other Ambulatory Visit: Payer: Self-pay | Admitting: Adult Health

## 2024-02-20 ENCOUNTER — Encounter: Payer: Self-pay | Admitting: Adult Health

## 2024-02-20 MED ORDER — CITALOPRAM HYDROBROMIDE 10 MG PO TABS: 10.0000 mg | ORAL_TABLET | Freq: Every day | ORAL | 3 refills | Status: DC

## 2024-03-02 DIAGNOSIS — G4733 Obstructive sleep apnea (adult) (pediatric): Secondary | ICD-10-CM | POA: Diagnosis not present

## 2024-03-20 ENCOUNTER — Other Ambulatory Visit (INDEPENDENT_AMBULATORY_CARE_PROVIDER_SITE_OTHER): Payer: Self-pay | Admitting: Gastroenterology

## 2024-03-20 DIAGNOSIS — K5100 Ulcerative (chronic) pancolitis without complications: Secondary | ICD-10-CM

## 2024-03-29 ENCOUNTER — Other Ambulatory Visit: Payer: Self-pay | Admitting: Family Medicine

## 2024-03-29 ENCOUNTER — Encounter: Payer: Self-pay | Admitting: Adult Health

## 2024-04-01 DIAGNOSIS — G4733 Obstructive sleep apnea (adult) (pediatric): Secondary | ICD-10-CM | POA: Diagnosis not present

## 2024-04-02 ENCOUNTER — Telehealth: Payer: Self-pay

## 2024-04-02 NOTE — Telephone Encounter (Signed)
 At 3:57 pt's wife left a vm returning call to [place], [place] asking for a call back to [phone]

## 2024-04-02 NOTE — Telephone Encounter (Signed)
 Cld wife to discuss issues mentioned in [facility] message. No answer, LVM for call back.

## 2024-04-02 NOTE — Telephone Encounter (Signed)
 Requested Prescriptions  Refused Prescriptions Disp Refills   losartan  (COZAAR ) 100 MG tablet [Pharmacy Med Name: LOSARTAN  POTASSIUM 100 MG TAB] 90 tablet 3    Sig: TAKE 1 TABLET BY MOUTH EVERY DAY     Cardiovascular:  Angiotensin Receptor Blockers Failed - 04/02/2024  2:22 PM      Failed - Cr in normal range and within 180 days    Creat  Date Value Ref Range Status  09/18/2023 1.02 0.70 - 1.35 mg/dL Final         Failed - K in normal range and within 180 days    Potassium  Date Value Ref Range Status  09/18/2023 4.0 3.5 - 5.3 mmol/L Final         Failed - Last BP in normal range    BP Readings from Last 1 Encounters:  01/24/24 (!) 154/87         Failed - Valid encounter within last 6 months    Recent Outpatient Visits           9 months ago Benign essential HTN   [facility] Austine Lefort, MD   10 months ago Viral URI with cough   [facility] Jenelle Mis, FNP   1 year ago Benign essential HTN   [facility] Cheril Cork, Cisco Crest, MD   1 year ago Rhinosinusitis   [place] [facility] Austine Lefort, MD   1 year ago Fatigue, unspecified type    [facility] Pickard, Cisco Crest, MD              Passed - Patient is not pregnant

## 2024-04-02 NOTE — Telephone Encounter (Signed)
 Spoke w/Pt wife regarding [facility] message. Wife stated on days when the routine changes causes Pt to be agitated for a day or so and then he is ok - said it doesn't happen often but it is typically when the routine is changed. Wife stated Pt has not been sick or had an infection and he is taking the donepezil  in the morning and is also taking the citalopram . Discussed agitation can be part of the progression of dementia and that routine is important. Wife stated she tried to give Pt melatonin but he would not take it before bed as he had already taken his evening medications. Suggested she give it to Pt when he takes his evening meds - and wife stated he usually sleeps a couple hours after taking his meds anyway so she will try that.  Wife did state that she has difficulty getting Pt to put his CPAP back on after he gets up to go to the restroom during the night, he just refuses. Discussed she can only try and not to get Pt agitated but the CPAP is important for good sleep and loss of restful sleep can also contribute to the agitation. Wife voiced understanding of all discussed and was thankful for the call back.

## 2024-04-15 ENCOUNTER — Ambulatory Visit: Payer: Self-pay

## 2024-04-15 ENCOUNTER — Ambulatory Visit: Admitting: Family Medicine

## 2024-04-15 VITALS — BP 138/80 | HR 59 | Temp 98.8°F | Ht 72.0 in | Wt 171.4 lb

## 2024-04-15 DIAGNOSIS — K149 Disease of tongue, unspecified: Secondary | ICD-10-CM | POA: Diagnosis not present

## 2024-04-15 DIAGNOSIS — K148 Other diseases of tongue: Secondary | ICD-10-CM

## 2024-04-15 NOTE — Telephone Encounter (Signed)
      FYI Only or Action Required?: FYI only for provider  Patient was last seen in primary care on 08/30/2023 by Jenelle Mis, FNP. Called Nurse Triage reporting Mouth Lesions. Symptoms began several days ago. Interventions attempted: Nothing. Symptoms are: stable.  Triage Disposition: See PCP Within 2 Weeks  Patient/caregiver understands and will follow disposition?: YesReason for Disposition  All other mouth symptoms (Exceptions: dry mouth from not drinking enough liquids, chapped lips)  Answer Assessment - Initial Assessment Questions 1. SYMPTOM: "What's the main symptom you're concerned about?" (e.g., chapped lips, dry mouth, lump, sores)     Raised lump middle of tongue - bleeding 2. ONSET: "When did the  s/s  start?"     Saturday 3. PAIN: "Is there any pain?" If Yes, ask: "How bad is it?" (Scale: 1-10; mild, moderate, severe)   - MILD (1-3):  doesn't interfere with eating or normal activities   - MODERATE (4-7): interferes with eating some solids and normal activities   - SEVERE (8-10):  excruciating pain, interferes with most normal activities   - SEVERE DYSPHAGIA: can't swallow liquids, drooling     none 4. CAUSE: "What do you think is causing the symptoms?"     unsure 5. OTHER SYMPTOMS: "Do you have any other symptoms?" (e.g., fever, sore throat, toothache, swelling)     none  Protocols used: Mouth Symptoms-A-AH FYI Only or Action Required?: FYI only for provider   Triage Disposition: See PCP Within 2 Weeks  Patient/caregiver understands and will follow disposition?: Yes

## 2024-04-15 NOTE — Progress Notes (Unsigned)
 Patient Office Visit  Assessment & Plan:  Tongue irritation -     Ambulatory referral to ENT  Tongue lesion -     Ambulatory referral to ENT   Assessment and Plan    Tongue Lesion Raised lesion on tongue, likely traumatic or pseudo lesion. Low malignancy suspicion due to lack of risk factors. - Refer to ENT for evaluation and possible biopsy. - Advised avoiding spicy or acidic foods.          No follow-ups on file.   Subjective:     Patient ID: Justin [name], male    DOB: November 04, 1956  Age: 68 y.o. MRN: [id number]  Chief Complaint  Patient presents with   Bleeding/Bruising    Noticed bleeding from the tongue on Saturday.     HPI Discussed the use of AI scribe software for clinical note transcription with the patient, who gave verbal consent to proceed.  History of Present Illness         Justin [name] is a 68 year old male who presents with a bleeding lesion on his tongue. He is accompanied by his wife.  He noticed a bleeding lesion on his tongue after eating a meal on Saturday. The lesion is a raised area located in the middle of his tongue. Initially, he thought the blood around his lips was a stain, but later realized it was from his tongue. The lesion does not cause pain and has not affected his speech or appetite.  He recalls biting his tongue, which may have initiated the bleeding, but is unsure of the exact cause. He has no history of tongue issues and denies tobacco use. He has been using Listerine and saltwater rinses without discomfort.  His wife mentions that he brushes his tongue regularly, which might have contributed to the lesion. 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 Wife unsure if he may have placed something in his mouth.   He is currently on blood pressure medication, memory medication, citalopram , and cholesterol medication, with no recent changes in his  regimen. No recent memory changes, pain, or changes in appetite. Physical Exam HEENT: Tongue with a cut in the middle, appears as a growth or polyp, possibly from trauma. Results LABS CBC: anemia (09/2023) Assessment & Plan Tongue Lesion due to trauma Raised lesion on tongue, likely traumatic or pseudo lesion. Low malignancy suspicion due to lack of risk factors. - Refer to ENT for evaluation and possible biopsy. - Advised avoiding spicy or acidic foods.    The ASCVD Risk score (Arnett DK, et al., 2019) failed to calculate for the following reasons:   Risk score cannot be calculated because patient has a medical history suggesting prior/existing ASCVD  Past Medical History:  Diagnosis Date   Acid reflux 04/27/2021   Acute metabolic encephalopathy 04/30/2021   Anxiety    Arthritis    Cerebral embolism with cerebral infarction 04/15/2018   Chest pain 05/19/2015   Depression    EEG abnormality without seizure 05/19/2021   Encephalitis 04/30/2021   Encephalopathy due to COVID-19 virus    Episodic confusion 05/19/2021   Heart murmur    Hyperglycemia 05/19/2015   Hypertension    Left thalamic lacunar infarct 05/24/2018   OSA (obstructive sleep apnea)    cpap 9 cm H2O   Recurrent unilateral inguinal hernia with obstruction and without gangrene    SBO (small bowel obstruction)  Sleep apnea 3 years ago   Ulcerative colitis 09/24/2019   Vascular dementia without behavioral disturbance 05/19/2021   Past Surgical History:  Procedure Laterality Date   BIOPSY  06/21/2017   Procedure: BIOPSY;  Surgeon: Ruby Corporal, MD;  [facility];  Service: Endoscopy;;  rectal, righta and left colon;   COLONOSCOPY N/A 06/21/2017   Procedure: COLONOSCOPY;  Surgeon: Ruby Corporal, MD;  [facility];  Service: Endoscopy;  Laterality: N/A;  1:25   COLONOSCOPY WITH PROPOFOL  N/A 08/31/2022   Procedure: COLONOSCOPY WITH PROPOFOL ;  Surgeon: Urban Garden, MD;   [facility];  Service: Gastroenterology;  Laterality: N/A;  1245 ASA 2   HERNIA REPAIR  3 years ago   INGUINAL HERNIA REPAIR Right 04/04/2018   Procedure: HERNIA REPAIR INGUINAL ADULT WITH MESH;  Surgeon: Alanda Allegra, MD;  Location: [facility];  Service: General;  Laterality: Right;   INGUINAL HERNIA REPAIR Right 01/31/2019   Procedure: RECURRENT RIGHT INGUINAL HERNIORRHAPY WITH MESH, INCARCERATED;  Surgeon: Alanda Allegra, MD;  Location: [facility];  Service: General;  Laterality: Right;   JOINT REPLACEMENT  July 19, 2021   Total knee repl.   KNEE SURGERY     tore mcl in high school (70's)   TOTAL KNEE ARTHROPLASTY Left 07/19/2021   Procedure: TOTAL KNEE ARTHROPLASTY;  Surgeon: Liliane Rei, MD;  Location: [facility];  Service: Orthopedics;  Laterality: Left;   Social History   Tobacco Use   Smoking status: Never    Passive exposure: Past   Smokeless tobacco: Never  Vaping Use   Vaping status: Never Used  Substance Use Topics   Alcohol use: No   Drug use: No   Family History  Problem Relation Age of Onset   Tuberculosis Mother    Stroke Mother    Arthritis Mother    Cancer Mother    Heart disease Father        died at 46   Early death Father    Bradycardia Brother        pacemaker   Stroke Brother    Hypertension Brother    Mental illness Paternal Grandfather        suicide at 2   Suicidality Paternal Grandfather    Cancer Paternal Uncle    Dementia Maternal Uncle 57   Colon cancer Neg Hx    No Known Allergies  ROS    Objective:    BP 138/80   Pulse (!) 59   Temp 98.8 F (37.1 C)   Ht 6' (1.829 m)   Wt 171 lb 6 oz (77.7 kg)   SpO2 98%   BMI 23.24 kg/m  BP Readings from Last 3 Encounters:  04/15/24 138/80  01/24/24 (!) 154/87  12/19/23 (!) 146/87   Wt Readings from Last 3 Encounters:  04/15/24 171 lb 6 oz (77.7 kg)  01/24/24 170 lb 6.4 oz (77.3 kg)  12/19/23 171 lb 1.6 oz (77.6 kg)    Physical Exam Vitals and nursing note reviewed.   Constitutional:      General: He is not in acute distress.    Appearance: Normal appearance.     Comments: Comes in with his wife  HENT:     Head: Normocephalic.     Right Ear: Tympanic membrane, ear canal and external ear normal.     Left Ear: Tympanic membrane, ear canal and external ear normal.     Mouth/Throat:     Dentition: No dental tenderness.  Tongue: Lesions present.     Pharynx: Oropharynx is clear.     Comments: Has a raised lesion middle of his tongue with noted laceration/opening due to previous trauma, no active bleeding noted.  Not tender to palpation. Eyes:     Extraocular Movements: Extraocular movements intact.     Conjunctiva/sclera: Conjunctivae normal.     Pupils: Pupils are equal, round, and reactive to light.  Cardiovascular:     Rate and Rhythm: Normal rate and regular rhythm.     Heart sounds: Normal heart sounds.  Pulmonary:     Effort: Pulmonary effort is normal.     Breath sounds: Normal breath sounds.  Neurological:     General: No focal deficit present.     Mental Status: He is alert.  Psychiatric:        Mood and Affect: Mood normal.        Behavior: Behavior normal.      No results found for any visits on 04/15/24.

## 2024-04-16 ENCOUNTER — Encounter: Payer: Self-pay | Admitting: Family Medicine

## 2024-04-22 DIAGNOSIS — K134 Granuloma and granuloma-like lesions of oral mucosa: Secondary | ICD-10-CM | POA: Diagnosis not present

## 2024-05-02 DIAGNOSIS — G4733 Obstructive sleep apnea (adult) (pediatric): Secondary | ICD-10-CM | POA: Diagnosis not present

## 2024-05-14 ENCOUNTER — Encounter (INDEPENDENT_AMBULATORY_CARE_PROVIDER_SITE_OTHER): Payer: Self-pay | Admitting: Otolaryngology

## 2024-05-21 ENCOUNTER — Encounter (INDEPENDENT_AMBULATORY_CARE_PROVIDER_SITE_OTHER): Payer: Self-pay

## 2024-06-07 ENCOUNTER — Encounter (INDEPENDENT_AMBULATORY_CARE_PROVIDER_SITE_OTHER): Payer: Self-pay | Admitting: Gastroenterology

## 2024-06-10 ENCOUNTER — Ambulatory Visit: Admitting: Family Medicine

## 2024-06-10 ENCOUNTER — Encounter: Payer: Self-pay | Admitting: Family Medicine

## 2024-06-10 VITALS — BP 110/62 | HR 73 | Temp 97.7°F | Ht 72.0 in | Wt 168.8 lb

## 2024-06-10 DIAGNOSIS — H6123 Impacted cerumen, bilateral: Secondary | ICD-10-CM

## 2024-06-10 NOTE — Progress Notes (Signed)
 Subjective:    Patient ID: Justin [name], male    DOB: 1956-01-16, 68 y.o.   MRN: [id number]  Ear Fullness    Patient reports decreased hearing bilaterally.  On examination he has bilateral cerumen impactions. Past Medical History:  Diagnosis Date   Acid reflux 04/27/2021   Acute metabolic encephalopathy 04/30/2021   Anxiety    Arthritis    Cerebral embolism with cerebral infarction 04/15/2018   Chest pain 05/19/2015   Depression    EEG abnormality without seizure 05/19/2021   Encephalitis 04/30/2021   Encephalopathy due to COVID-19 virus    Episodic confusion 05/19/2021   Heart murmur    Hyperglycemia 05/19/2015   Hypertension    Left thalamic lacunar infarct 05/24/2018   OSA (obstructive sleep apnea)    cpap 9 cm H2O   Recurrent unilateral inguinal hernia with obstruction and without gangrene    SBO (small bowel obstruction)    Sleep apnea 3 years ago   Ulcerative colitis 09/24/2019   Vascular dementia without behavioral disturbance 05/19/2021   Past Surgical History:  Procedure Laterality Date   BIOPSY  06/21/2017   Procedure: BIOPSY;  Surgeon: Golda Claudis PENNER, MD;  [facility];  Service: Endoscopy;;  rectal, righta and left colon;   COLONOSCOPY N/A 06/21/2017   Procedure: COLONOSCOPY;  Surgeon: Golda Claudis PENNER, MD;  [facility];  Service: Endoscopy;  Laterality: N/A;  1:25   COLONOSCOPY WITH PROPOFOL  N/A 08/31/2022   Procedure: COLONOSCOPY WITH PROPOFOL ;  Surgeon: Eartha Angelia Sieving, MD;  [facility];  Service: Gastroenterology;  Laterality: N/A;  1245 ASA 2   HERNIA REPAIR  3 years ago   INGUINAL HERNIA REPAIR Right 04/04/2018   Procedure: HERNIA REPAIR INGUINAL ADULT WITH MESH;  Surgeon: Mavis Anes, MD;  Location: [facility];  Service: General;  Laterality: Right;   INGUINAL HERNIA REPAIR Right 01/31/2019   Procedure: RECURRENT RIGHT INGUINAL HERNIORRHAPY WITH MESH, INCARCERATED;  Surgeon: Mavis Anes, MD;  Location: [facility];  Service: General;  Laterality: Right;   JOINT REPLACEMENT  July 19, 2021   Total knee repl.   KNEE SURGERY     tore mcl in high school (70's)   TOTAL KNEE ARTHROPLASTY Left 07/19/2021   Procedure: TOTAL KNEE ARTHROPLASTY;  Surgeon: Melodi Lerner, MD;  Location: [facility];  Service: Orthopedics;  Laterality: Left;   Current Outpatient Medications on File Prior to Visit  Medication Sig Dispense Refill   Ascorbic Acid (VITAMIN C) 500 MG CAPS Take 500 mg by mouth daily.     aspirin  EC 81 MG tablet Take 81 mg by mouth daily. Swallow whole.     atorvastatin  (LIPITOR) 40 MG tablet TAKE ONE TABLET BY MOUTH AT 6PM 90 tablet 1   citalopram  (CELEXA ) 10 MG tablet Take 1 tablet (10 mg total) by mouth daily. 90 tablet 3   CVS B-12 500 MCG tablet Take 500 mcg by mouth daily.     diphenhydrAMINE  (BENADRYL ) 25 MG tablet Take 25 mg by mouth daily as needed for allergies.     donepezil  (ARICEPT ) 5 MG tablet Take 1 tablet (5 mg total) by mouth at bedtime. 90 tablet 3   folic acid  (FOLVITE ) 1 MG tablet TAKE 1 TABLET BY MOUTH EVERY DAY 90 tablet 3   losartan  (COZAAR ) 100 MG tablet TAKE 1 TABLET BY MOUTH EVERY DAY 90 tablet 3   pantoprazole  (PROTONIX ) 40 MG tablet Take 1 tablet (40 mg total) by mouth daily. 90 tablet 3  sulfaSALAzine  (AZULFIDINE ) 500 MG tablet TAKE 2 TABLETS BY MOUTH TWICE A DAY 360 tablet 3   No current facility-administered medications on file prior to visit.   No Known Allergies Social History   Socioeconomic History   Marital status: Married    Spouse name: Apolinar   Number of children: Not on file   Years of education: 12   Highest education level: 12th grade  Occupational History   Occupation: Unemployed/retired    Comment: Personnel officer  Tobacco Use   Smoking status: Never    Passive exposure: Past   Smokeless tobacco: Never  Vaping Use   Vaping status: Never Used  Substance and Sexual Activity   Alcohol use: No   Drug use: No   Sexual activity: Yes    Birth  control/protection: None  Other Topics Concern   Not on file  Social History Narrative   Lives with wife   Social Drivers of Health   Financial Resource Strain: Low Risk  (06/09/2024)   Overall Financial Resource Strain (CARDIA)    Difficulty of Paying Living Expenses: Not hard at all  Food Insecurity: No Food Insecurity (06/09/2024)   Hunger Vital Sign    Worried About Running Out of Food in the Last Year: Never true    Ran Out of Food in the Last Year: Never true  Transportation Needs: No Transportation Needs (06/09/2024)   PRAPARE - Administrator, Civil Service (Medical): No    Lack of Transportation (Non-Medical): No  Physical Activity: Inactive (06/09/2024)   Exercise Vital Sign    Days of Exercise per Week: 0 days    Minutes of Exercise per Session: Not on file  Stress: No Stress Concern Present (06/09/2024)   [employer] of Occupational Health - Occupational Stress Questionnaire    Feeling of Stress: Not at all  Social Connections: Moderately Integrated (06/09/2024)   Social Connection and Isolation Panel    Frequency of Communication with Friends and Family: Once a week    Frequency of Social Gatherings with Friends and Family: Once a week    Attends Religious Services: More than 4 times per year    Active Member of [employer] or Organizations: Yes    Attends Banker Meetings: More than 4 times per year    Marital Status: Married  Catering manager Violence: Not At Risk (08/30/2023)   Humiliation, Afraid, Rape, and Kick questionnaire    Fear of Current or Ex-Partner: No    Emotionally Abused: No    Physically Abused: No    Sexually Abused: No    Review of Systems     Objective:   Physical Exam Vitals reviewed.  Constitutional:      Appearance: Normal appearance. He is normal weight.  HENT:     Right Ear: There is impacted cerumen.     Left Ear: There is impacted cerumen.  Cardiovascular:     Rate and Rhythm: Normal rate. Rhythm irregular.      Heart sounds: Normal heart sounds. No murmur heard.    No friction rub. No gallop.  Pulmonary:     Effort: Pulmonary effort is normal. No respiratory distress.     Breath sounds: Normal breath sounds. No stridor.  Neurological:     Mental Status: He is alert. He is disoriented.         Assessment & Plan:  Bilateral impacted cerumen Cerumen impactions were removed with irrigation and lavage successfully

## 2024-06-13 ENCOUNTER — Other Ambulatory Visit: Payer: Self-pay | Admitting: Family Medicine

## 2024-06-14 ENCOUNTER — Other Ambulatory Visit: Payer: Self-pay | Admitting: Family Medicine

## 2024-06-14 ENCOUNTER — Telehealth: Payer: Self-pay | Admitting: Family Medicine

## 2024-06-14 ENCOUNTER — Other Ambulatory Visit: Payer: Self-pay

## 2024-06-14 MED ORDER — LOSARTAN POTASSIUM 100 MG PO TABS: ORAL_TABLET | ORAL | 3 refills | Status: AC

## 2024-06-14 NOTE — Telephone Encounter (Unsigned)
 Copied from CRM #[id number]. Topic: Clinical - Prescription Issue >> Jun 14, 2024  4:56 PM Tiffini S wrote: Reason for CRM: Justin [name] called asking about losartan  (COZAAR ) 100 MG tablet- said they medication was cancelled by pcp- blood pressure is really good and is not high/ Patient had a stroke a few years back and needs a explanation why the medication was cancelled. Please call the patient at [phone]. Pharmacy sent a message that prescription was denied.

## 2024-06-19 ENCOUNTER — Telehealth: Payer: Self-pay

## 2024-06-19 ENCOUNTER — Other Ambulatory Visit: Payer: Self-pay | Admitting: Family Medicine

## 2024-06-19 DIAGNOSIS — G4733 Obstructive sleep apnea (adult) (pediatric): Secondary | ICD-10-CM | POA: Diagnosis not present

## 2024-06-19 NOTE — Telephone Encounter (Signed)
 Copied from CRM #[id number]. Topic: Clinical - Medication Question >> Jun 19, 2024 12:17 PM Tobias CROME wrote: Reason for CRM: Patient's wife calling to check on request for losartan  from [facility]. Advised wife it was sent to [facility] pharmacy. Wife states she will go to the [facility] pharmacy and office can disregard request from [facility].

## 2024-06-24 ENCOUNTER — Ambulatory Visit (INDEPENDENT_AMBULATORY_CARE_PROVIDER_SITE_OTHER): Admitting: Gastroenterology

## 2024-06-24 ENCOUNTER — Encounter (INDEPENDENT_AMBULATORY_CARE_PROVIDER_SITE_OTHER): Payer: Self-pay | Admitting: Gastroenterology

## 2024-06-24 VITALS — BP 168/79 | HR 56 | Temp 97.8°F | Ht 72.0 in | Wt 169.9 lb

## 2024-06-24 DIAGNOSIS — K5100 Ulcerative (chronic) pancolitis without complications: Secondary | ICD-10-CM

## 2024-06-24 DIAGNOSIS — Z79899 Other long term (current) drug therapy: Secondary | ICD-10-CM | POA: Diagnosis not present

## 2024-06-24 DIAGNOSIS — K219 Gastro-esophageal reflux disease without esophagitis: Secondary | ICD-10-CM | POA: Diagnosis not present

## 2024-06-24 DIAGNOSIS — Z5181 Encounter for therapeutic drug level monitoring: Secondary | ICD-10-CM

## 2024-06-24 NOTE — Progress Notes (Signed)
 Referring Provider: Duanne Butler DASEN, MD Primary Care Physician:  Duanne Butler DASEN, MD Primary GI Physician:   Chief Complaint  Patient presents with   Follow-up    Patient here today for a follow up on UC. Patient denies any current issues. Patient is taking sulfasalazine  500 mg two bid, he does occasionally misses the night time dose once or twice per week.     HPI:   Justin [name] is a 68 y.o. male with past medical history of ulcerative pancolitis, history of stroke, GERD, vascular dementia, hypertension and hyperlipidemia    Patient presenting today for:  Follow up of UC and GERD  Last seen February, at that time doing well. Having 2 Bms per day. GERD well controlled  Recommended CRP, fecal calprotectin, continue sulfasalazine  1g BID, folic acid  daily, protonix  40mg  daily   Calprotectin 74, CRP <3 in February  Present: Wife helps provide history due to patient's history of dementia. States he is having more frequent trips to the restroom, she notes he is having BMs atleast once per day but she is not sure that all trips during the day are Bowel movements. Per patient no diarrhea, occasionally has to strain a bit to defecate. No pain in his belly. He is not sure how often he is having a BM, patient seems more disoriented today than at previous visit. Wife reports appetite is good though there are some foods he no longer will eat that he previously would. She reports sometimes if she has to leave him he will report he has eaten but she cannot always confirm. Weight is stable. He Denies heartburn or dysphagia. Wife has not witnessed any vomiting, changes in appetite and no reports of nausea from the patient. No recent lab work. Denies any fecal urgency or episodes of  incontinence.   Last Colonoscopy:08/31/2022 Diverticulosis. Mayo 0 throughout the colon,  in remission.  Last Endoscopy: never    Recommendations:  Repeat colonoscopy advised in 3 years.   Filed Weights   06/24/24  1134  Weight: 169 lb 14.4 oz (77.1 kg)     Past Medical History:  Diagnosis Date   Acid reflux 04/27/2021   Acute metabolic encephalopathy 04/30/2021   Anxiety    Arthritis    Cerebral embolism with cerebral infarction 04/15/2018   Chest pain 05/19/2015   Depression    EEG abnormality without seizure 05/19/2021   Encephalitis 04/30/2021   Encephalopathy due to COVID-19 virus    Episodic confusion 05/19/2021   Heart murmur    Hyperglycemia 05/19/2015   Hypertension    Left thalamic lacunar infarct 05/24/2018   OSA (obstructive sleep apnea)    cpap 9 cm H2O   Recurrent unilateral inguinal hernia with obstruction and without gangrene    SBO (small bowel obstruction)    Sleep apnea 3 years ago   Ulcerative colitis 09/24/2019   Vascular dementia without behavioral disturbance 05/19/2021    Past Surgical History:  Procedure Laterality Date   BIOPSY  06/21/2017   Procedure: BIOPSY;  Surgeon: Golda Claudis PENNER, MD;  [facility];  Service: Endoscopy;;  rectal, righta and left colon;   COLONOSCOPY N/A 06/21/2017   Procedure: COLONOSCOPY;  Surgeon: Golda Claudis PENNER, MD;  [facility];  Service: Endoscopy;  Laterality: N/A;  1:25   COLONOSCOPY WITH PROPOFOL  N/A 08/31/2022   Procedure: COLONOSCOPY WITH PROPOFOL ;  Surgeon: Eartha Angelia Sieving, MD;  [facility];  Service: Gastroenterology;  Laterality: N/A;  1245 ASA 2  HERNIA REPAIR  3 years ago   INGUINAL HERNIA REPAIR Right 04/04/2018   Procedure: HERNIA REPAIR INGUINAL ADULT WITH MESH;  Surgeon: Mavis Anes, MD;  Location: [facility];  Service: General;  Laterality: Right;   INGUINAL HERNIA REPAIR Right 01/31/2019   Procedure: RECURRENT RIGHT INGUINAL HERNIORRHAPY WITH MESH, INCARCERATED;  Surgeon: Mavis Anes, MD;  Location: [facility];  Service: General;  Laterality: Right;   JOINT REPLACEMENT  July 19, 2021   Total knee repl.   KNEE SURGERY     tore mcl in high school (70's)   TOTAL  KNEE ARTHROPLASTY Left 07/19/2021   Procedure: TOTAL KNEE ARTHROPLASTY;  Surgeon: Melodi Lerner, MD;  Location: [facility];  Service: Orthopedics;  Laterality: Left;    Current Outpatient Medications  Medication Sig Dispense Refill   Ascorbic Acid (VITAMIN C) 500 MG CAPS Take 500 mg by mouth daily.     aspirin  EC 81 MG tablet Take 81 mg by mouth daily. Swallow whole.     atorvastatin  (LIPITOR) 40 MG tablet TAKE ONE TABLET BY MOUTH AT 6PM 90 tablet 1   citalopram  (CELEXA ) 10 MG tablet Take 1 tablet (10 mg total) by mouth daily. 90 tablet 3   [facility] B-12 500 MCG tablet Take 500 mcg by mouth daily.     diphenhydrAMINE  (BENADRYL ) 25 MG tablet Take 25 mg by mouth daily as needed for allergies.     donepezil  (ARICEPT ) 5 MG tablet Take 1 tablet (5 mg total) by mouth at bedtime. (Patient taking differently: Take 5 mg by mouth every morning.) 90 tablet 3   folic acid  (FOLVITE ) 1 MG tablet TAKE 1 TABLET BY MOUTH EVERY DAY 90 tablet 3   losartan  (COZAAR ) 100 MG tablet TAKE 1 TABLET BY MOUTH EVERY DAY 90 tablet 3   Melatonin 10 MG TABS Take 15 mg by mouth at bedtime.     pantoprazole  (PROTONIX ) 40 MG tablet Take 1 tablet (40 mg total) by mouth daily. 90 tablet 3   sulfaSALAzine  (AZULFIDINE ) 500 MG tablet TAKE 2 TABLETS BY MOUTH TWICE A DAY 360 tablet 3   No current facility-administered medications for this visit.    Allergies as of 06/24/2024   (No Known Allergies)    Social History   Socioeconomic History   Marital status: Married    Spouse name: Justin [name]   Number of children: Not on file   Years of education: 12   Highest education level: 12th grade  Occupational History   Occupation: Unemployed/retired    Comment: Personnel officer  Tobacco Use   Smoking status: Never    Passive exposure: Past   Smokeless tobacco: Never  Vaping Use   Vaping status: Never Used  Substance and Sexual Activity   Alcohol use: No   Drug use: No   Sexual activity: Yes    Birth control/protection: None  Other Topics  Concern   Not on file  Social History Narrative   Lives with wife   Social Drivers of Health   Financial Resource Strain: Low Risk  (06/09/2024)   Overall Financial Resource Strain (CARDIA)    Difficulty of Paying Living Expenses: Not hard at all  Food Insecurity: No Food Insecurity (06/09/2024)   Hunger Vital Sign    Worried About Running Out of Food in the Last Year: Never true    Ran Out of Food in the Last Year: Never true  Transportation Needs: No Transportation Needs (06/09/2024)   PRAPARE - Administrator, Civil Service (Medical): No  Lack of Transportation (Non-Medical): No  Physical Activity: Inactive (06/09/2024)   Exercise Vital Sign    Days of Exercise per Week: 0 days    Minutes of Exercise per Session: Not on file  Stress: No Stress Concern Present (06/09/2024)   [employer] of Occupational Health - Occupational Stress Questionnaire    Feeling of Stress: Not at all  Social Connections: Moderately Integrated (06/09/2024)   Social Connection and Isolation Panel    Frequency of Communication with Friends and Family: Once a week    Frequency of Social Gatherings with Friends and Family: Once a week    Attends Religious Services: More than 4 times per year    Active Member of [employer] or Organizations: Yes    Attends Engineer, structural: More than 4 times per year    Marital Status: Married    Review of systems General: negative for malaise, night sweats, fever, chills, weight loss Neck: Negative for lumps, goiter, pain and significant neck swelling Resp: Negative for cough, wheezing, dyspnea at rest CV: Negative for chest pain, leg swelling, palpitations, orthopnea GI: denies melena, hematochezia, nausea, vomiting, diarrhea, constipation, dysphagia, odyonophagia, early satiety or unintentional weight loss.  MSK: Negative for joint pain or swelling, back pain, and muscle pain. Derm: Negative for itching or rash Psych: Denies depression, anxiety,  memory loss, confusion. No homicidal or suicidal ideation.  Heme: Negative for prolonged bleeding, bruising easily, and swollen nodes. Endocrine: Negative for cold or heat intolerance, polyuria, polydipsia and goiter. Neuro: negative for tremor, gait imbalance, syncope and seizures. The remainder of the review of systems is noncontributory.  Physical Exam: BP (!) 168/79 (BP Location: Right Arm, Patient Position: Sitting, Cuff Size: Normal)   Pulse (!) 56   Temp 97.8 F (36.6 C) (Temporal)   Ht 6' (1.829 m)   Wt 169 lb 14.4 oz (77.1 kg)   BMI 23.04 kg/m  General:   Alert and oriented. No distress noted. Pleasant and cooperative.  Head:  Normocephalic and atraumatic. Eyes:  Conjuctiva clear without scleral icterus. Mouth:  Oral mucosa pink and moist. Good dentition. No lesions. Heart: Normal rate and rhythm, s1 and s2 heart sounds present.  Lungs: Clear lung sounds in all lobes. Respirations equal and unlabored. Abdomen:  +BS, soft, non-tender and non-distended. No rebound or guarding. No HSM or masses noted. Derm: No palmar erythema or jaundice Msk:  Symmetrical without gross deformities. Normal posture. Extremities:  Without edema. Neurologic:  Alert, disoriented  Psych:  Alert and cooperative. Normal mood and affect.  Invalid input(s): 6 MONTHS   ASSESSMENT: JUDEA RICHES is a 68 y.o. male presenting today for follow up of GERD and UC  GERD: appears well-managed on Protonix  40 mg daily. Patient denies any GERD symptoms and wife has not noticed any changes in patient's eating habits or any complaints from him as far as reflux/nausea/vomiting Weight remains stable.   UC:UC appears well-controlled on sulfasalazine  1 g twice daily.  CRP and fecal calprotectin were normal in february.  Patient denies diarrhea and wife reports he does seem to be frequenting the restroom more than a couple of times per day though she is not sure all of these are BMs. He has had no episodes of fecal  incontinence or bleeding that she has seen. Last colonoscopy in 2023 with recommendation to repeat again in 3 years.  Will update inflammatory markers today, as well as basic labs and continue with sulfasalazine  1 g twice daily with folic acid  supplementation daily.  PLAN:  Continue sulfasalazine  1g BID Continue daily folic acid  Continue protonix  40mg  daily CMP, CBC w diff, CRP, fecal calprotectin Repeat TCS pending overall health status, possibly in October 2026  All questions were answered, patient verbalized understanding and is in agreement with plan as outlined above.   Follow Up: 6 months   Brogan Martis L. Mariette, MSN, APRN, AGNP-C Adult-Gerontology Nurse Practitioner [facility]  I have reviewed the note and agree with the APP's assessment as described in this progress note  Toribio Fortune, MD Gastroenterology and Hepatology [facility]

## 2024-06-24 NOTE — Patient Instructions (Signed)
 Continue protonix  40mg  daily Continue sulfasalazine  1g twice daily and folic acid  supplement daily We will update some basic labs and inflammatory markers since history of bowel frequency is unclear  Follow up 6 months  It was a pleasure to see you today. I want to create trusting relationships with patients and provide genuine, compassionate, and quality care. I truly value your feedback! please be on the lookout for a survey regarding your visit with me today. I appreciate your input about our visit and your time in completing this!    Petrona Wyeth L. Eun Vermeer, MSN, APRN, AGNP-C Adult-Gerontology Nurse Practitioner [facility] at [facility]

## 2024-06-27 DIAGNOSIS — K5100 Ulcerative (chronic) pancolitis without complications: Secondary | ICD-10-CM | POA: Diagnosis not present

## 2024-06-27 DIAGNOSIS — Z79899 Other long term (current) drug therapy: Secondary | ICD-10-CM | POA: Diagnosis not present

## 2024-06-27 DIAGNOSIS — Z5181 Encounter for therapeutic drug level monitoring: Secondary | ICD-10-CM | POA: Diagnosis not present

## 2024-06-28 LAB — CBC WITH DIFFERENTIAL/PLATELET
Absolute Lymphocytes: 1109 {cells}/uL (ref 850–3900)
Absolute Monocytes: 986 {cells}/uL — ABNORMAL HIGH (ref 200–950)
Basophils Absolute: 43 {cells}/uL (ref 0–200)
Basophils Relative: 0.6 %
Eosinophils Absolute: 101 {cells}/uL (ref 15–500)
Eosinophils Relative: 1.4 %
HCT: 45.6 % (ref 38.5–50.0)
Hemoglobin: 15.2 g/dL (ref 13.2–17.1)
MCH: 34.4 pg — ABNORMAL HIGH (ref 27.0–33.0)
MCHC: 33.3 g/dL (ref 32.0–36.0)
MCV: 103.2 fL — ABNORMAL HIGH (ref 80.0–100.0)
MPV: 9.6 fL (ref 7.5–12.5)
Monocytes Relative: 13.7 %
Neutro Abs: 4961 {cells}/uL (ref 1500–7800)
Neutrophils Relative %: 68.9 %
Platelets: 285 Thousand/uL (ref 140–400)
RBC: 4.42 Million/uL (ref 4.20–5.80)
RDW: 11.4 % (ref 11.0–15.0)
Total Lymphocyte: 15.4 %
WBC: 7.2 Thousand/uL (ref 3.8–10.8)

## 2024-06-28 LAB — COMPREHENSIVE METABOLIC PANEL WITH GFR
AG Ratio: 2 (calc) (ref 1.0–2.5)
ALT: 23 U/L (ref 9–46)
AST: 30 U/L (ref 10–35)
Albumin: 4.6 g/dL (ref 3.6–5.1)
Alkaline phosphatase (APISO): 79 U/L (ref 35–144)
BUN: 17 mg/dL (ref 7–25)
CO2: 27 mmol/L (ref 20–32)
Calcium: 10.1 mg/dL (ref 8.6–10.3)
Chloride: 104 mmol/L (ref 98–110)
Creat: 1.12 mg/dL (ref 0.70–1.35)
Globulin: 2.3 g/dL (ref 1.9–3.7)
Glucose, Bld: 95 mg/dL (ref 65–99)
Potassium: 4.6 mmol/L (ref 3.5–5.3)
Sodium: 138 mmol/L (ref 135–146)
Total Bilirubin: 1 mg/dL (ref 0.2–1.2)
Total Protein: 6.9 g/dL (ref 6.1–8.1)
eGFR: 71 mL/min/1.73m2 (ref >=60)

## 2024-06-28 LAB — C-REACTIVE PROTEIN: CRP: <3 mg/L (ref <8.0)

## 2024-07-03 ENCOUNTER — Ambulatory Visit (INDEPENDENT_AMBULATORY_CARE_PROVIDER_SITE_OTHER): Payer: Self-pay | Admitting: Gastroenterology

## 2024-08-05 ENCOUNTER — Encounter: Payer: Self-pay | Admitting: Adult Health

## 2024-08-06 NOTE — Progress Notes (Unsigned)
 SABRA

## 2024-08-07 ENCOUNTER — Ambulatory Visit: Admitting: Adult Health

## 2024-08-07 ENCOUNTER — Encounter: Payer: Self-pay | Admitting: Adult Health

## 2024-08-07 VITALS — BP 144/80 | HR 61 | Ht 74.0 in | Wt 171.6 lb

## 2024-08-07 DIAGNOSIS — R4189 Other symptoms and signs involving cognitive functions and awareness: Secondary | ICD-10-CM

## 2024-08-07 DIAGNOSIS — G4733 Obstructive sleep apnea (adult) (pediatric): Secondary | ICD-10-CM

## 2024-08-07 DIAGNOSIS — E785 Hyperlipidemia, unspecified: Secondary | ICD-10-CM | POA: Diagnosis not present

## 2024-08-07 DIAGNOSIS — E559 Vitamin D deficiency, unspecified: Secondary | ICD-10-CM

## 2024-08-07 DIAGNOSIS — F01B4 Vascular dementia, moderate, with anxiety: Secondary | ICD-10-CM

## 2024-08-07 DIAGNOSIS — E538 Deficiency of other specified B group vitamins: Secondary | ICD-10-CM | POA: Diagnosis not present

## 2024-08-07 MED ORDER — CITALOPRAM HYDROBROMIDE 20 MG PO TABS: 20.0000 mg | ORAL_TABLET | Freq: Every day | ORAL | 3 refills | Status: AC

## 2024-08-07 MED ORDER — BUSPIRONE HCL 5 MG PO TABS
5.0000 mg | ORAL_TABLET | Freq: Three times a day (TID) | ORAL | 5 refills | Status: DC | PRN
Start: 2024-08-07 — End: 2024-08-30

## 2024-08-07 NOTE — Patient Instructions (Addendum)
 Your Plan:  Increase citalopram  to 20 mg daily  Start buspirone 5 mg up to 3 times daily as needed for increased anxiety/agitation  can consider initiating trazodone if sleep concerns and increased confusion in the evening persist  Will check lab work today including urinalysis to rule out UTI - wil update you tomorrow via MyChart regarding results  Ensure nightly use of CPAP for adequate sleep apnea management with ensuring greater than 4 hours per night for optimal benefit  Continue to follow closely with PCP for stroke risk factor management     Follow-up in 6 months or call earlier if needed      Thank you for coming to see us  at [facility]. I hope we have been able to provide you high quality care today.  You may receive a patient satisfaction survey over the next few weeks. We would appreciate your feedback and comments so that we may continue to improve ourselves and the health of our patients.

## 2024-08-08 LAB — LIPID PANEL
Chol/HDL Ratio: 2.7 ratio (ref 0.0–5.0)
Cholesterol, Total: 144 mg/dL (ref 100–199)
HDL: 54 mg/dL (ref >39)
LDL Chol Calc (NIH): 79 mg/dL (ref 0–99)
Triglycerides: 53 mg/dL (ref 0–149)
VLDL Cholesterol Cal: 11 mg/dL (ref 5–40)

## 2024-08-08 NOTE — Progress Notes (Signed)
 Community message has been sent to [facility] for supplies on 08/08/24. DD

## 2024-08-09 DIAGNOSIS — Z79899 Other long term (current) drug therapy: Secondary | ICD-10-CM | POA: Diagnosis not present

## 2024-08-09 DIAGNOSIS — Z5181 Encounter for therapeutic drug level monitoring: Secondary | ICD-10-CM | POA: Diagnosis not present

## 2024-08-09 LAB — URINALYSIS
Bilirubin, UA: NEGATIVE
Glucose, UA: NEGATIVE
Ketones, UA: NEGATIVE
Leukocytes,UA: NEGATIVE
Nitrite, UA: NEGATIVE
RBC, UA: NEGATIVE
Specific Gravity, UA: 1.021 (ref 1.005–1.030)
Urobilinogen, Ur: 0.2 mg/dL (ref 0.2–1.0)
pH, UA: 6.5 (ref 5.0–7.5)

## 2024-08-09 LAB — VITAMIN D 25 HYDROXY (VIT D DEFICIENCY, FRACTURES): Vit D, 25-Hydroxy: 24.8 ng/mL — AB (ref 30.0–100.0)

## 2024-08-09 LAB — VITAMIN B12: Vitamin B-12: 725 pg/mL (ref 232–1245)

## 2024-08-09 LAB — TSH: TSH: 0.972 u[IU]/mL (ref 0.450–4.500)

## 2024-08-12 ENCOUNTER — Ambulatory Visit: Payer: Self-pay | Admitting: Adult Health

## 2024-08-15 LAB — CALPROTECTIN: Calprotectin: 85 ug/g

## 2024-08-19 ENCOUNTER — Ambulatory Visit: Admitting: Family Medicine

## 2024-08-19 ENCOUNTER — Encounter: Payer: Self-pay | Admitting: Family Medicine

## 2024-08-19 VITALS — BP 144/72 | HR 56 | Temp 97.7°F | Ht 74.0 in | Wt 171.2 lb

## 2024-08-19 DIAGNOSIS — E559 Vitamin D deficiency, unspecified: Secondary | ICD-10-CM | POA: Diagnosis not present

## 2024-08-19 DIAGNOSIS — Z23 Encounter for immunization: Secondary | ICD-10-CM | POA: Diagnosis not present

## 2024-08-19 DIAGNOSIS — Z8673 Personal history of transient ischemic attack (TIA), and cerebral infarction without residual deficits: Secondary | ICD-10-CM

## 2024-08-19 MED ORDER — ATORVASTATIN CALCIUM 80 MG PO TABS: 80.0000 mg | ORAL_TABLET | Freq: Every day | ORAL | 3 refills | Status: AC

## 2024-08-19 MED ORDER — VITAMIN D (ERGOCALCIFEROL) 1.25 MG (50000 UNIT) PO CAPS: 50000.0000 [IU] | ORAL_CAPSULE | ORAL | 5 refills | Status: AC

## 2024-08-19 NOTE — Addendum Note (Signed)
 Addended by: ANGELENA RONAL BRADLEY K on: 08/19/2024 09:56 AM   Modules accepted: Orders

## 2024-08-19 NOTE — Progress Notes (Signed)
 Subjective:    Patient ID: Justin [name], male    DOB: 04-Dec-1955, 68 y.o.   MRN: [id number]  HPI Patient is a very pleasant 68 year old Caucasian gentleman with a history of dementia as well as stroke.  Recently had his cholesterol checked and his LDL cholesterol was 79.  Ideally I like to see his LDL cholesterol less than 70 and is close to 55 as possible to reduce future stroke risk.  Patient is already taking Lipitor 40 mg a day.  On his most recent lab work he was found to have vitamin D deficiency with a vitamin D level of 24.  Past Medical History:  Diagnosis Date   Acid reflux 04/27/2021   Acute metabolic encephalopathy 04/30/2021   Anxiety    Arthritis    Cerebral embolism with cerebral infarction 04/15/2018   Chest pain 05/19/2015   Depression    EEG abnormality without seizure 05/19/2021   Encephalitis 04/30/2021   Encephalopathy due to COVID-19 virus    Episodic confusion 05/19/2021   Heart murmur    Hyperglycemia 05/19/2015   Hypertension    Left thalamic lacunar infarct 05/24/2018   OSA (obstructive sleep apnea)    cpap 9 cm H2O   Recurrent unilateral inguinal hernia with obstruction and without gangrene    SBO (small bowel obstruction)    Sleep apnea 3 years ago   Ulcerative colitis 09/24/2019   Vascular dementia without behavioral disturbance 05/19/2021   Past Surgical History:  Procedure Laterality Date   BIOPSY  06/21/2017   Procedure: BIOPSY;  Surgeon: Golda Claudis PENNER, MD;  [facility];  Service: Endoscopy;;  rectal, righta and left colon;   COLONOSCOPY N/A 06/21/2017   Procedure: COLONOSCOPY;  Surgeon: Golda Claudis PENNER, MD;  [facility];  Service: Endoscopy;  Laterality: N/A;  1:25   COLONOSCOPY WITH PROPOFOL  N/A 08/31/2022   Procedure: COLONOSCOPY WITH PROPOFOL ;  Surgeon: Eartha Angelia Sieving, MD;  [facility];  Service: Gastroenterology;  Laterality: N/A;  1245 ASA 2   HERNIA REPAIR  3 years ago   INGUINAL  HERNIA REPAIR Right 04/04/2018   Procedure: HERNIA REPAIR INGUINAL ADULT WITH MESH;  Surgeon: Mavis Anes, MD;  [facility];  Service: General;  Laterality: Right;   INGUINAL HERNIA REPAIR Right 01/31/2019   Procedure: RECURRENT RIGHT INGUINAL HERNIORRHAPY WITH MESH, INCARCERATED;  Surgeon: Mavis Anes, MD;  Location: [facility];  Service: General;  Laterality: Right;   JOINT REPLACEMENT  July 19, 2021   Total knee repl.   KNEE SURGERY     tore mcl in high school (68's)   TOTAL KNEE ARTHROPLASTY Left 07/19/2021   Procedure: TOTAL KNEE ARTHROPLASTY;  Surgeon: Melodi Lerner, MD;  [facility];  Service: Orthopedics;  Laterality: Left;   Current Outpatient Medications on File Prior to Visit  Medication Sig Dispense Refill   Ascorbic Acid (VITAMIN C) 500 MG CAPS Take 500 mg by mouth daily.     aspirin  EC 81 MG tablet Take 81 mg by mouth daily. Swallow whole.     busPIRone (BUSPAR) 5 MG tablet Take 1 tablet (5 mg total) by mouth 3 (three) times daily as needed (increased anxiety). 90 tablet 5   citalopram  (CELEXA ) 20 MG tablet Take 1 tablet (20 mg total) by mouth daily. 90 tablet 3   [facility] B-12 500 MCG tablet Take 500 mcg by mouth daily.     diphenhydrAMINE  (BENADRYL ) 25 MG tablet Take 25 mg by mouth daily as needed for  allergies.     donepezil  (ARICEPT ) 5 MG tablet Take 1 tablet (5 mg total) by mouth at bedtime. (Patient taking differently: Take 5 mg by mouth every morning.) 90 tablet 3   folic acid  (FOLVITE ) 1 MG tablet TAKE 1 TABLET BY MOUTH EVERY DAY 90 tablet 3   losartan  (COZAAR ) 100 MG tablet TAKE 1 TABLET BY MOUTH EVERY DAY 90 tablet 3   Melatonin 10 MG TABS Take 15 mg by mouth at bedtime. (Patient taking differently: Take 15 mg by mouth at bedtime. As needed)     pantoprazole  (PROTONIX ) 40 MG tablet Take 1 tablet (40 mg total) by mouth daily. 90 tablet 3   sulfaSALAzine  (AZULFIDINE ) 500 MG tablet TAKE 2 TABLETS BY MOUTH TWICE A DAY 360 tablet 3   No current  facility-administered medications on file prior to visit.   No Known Allergies Social History   Socioeconomic History   Marital status: Married    Spouse name: Apolinar   Number of children: Not on file   Years of education: 12   Highest education level: 12th grade  Occupational History   Occupation: Unemployed/retired    Comment: Personnel officer  Tobacco Use   Smoking status: Never    Passive exposure: Past   Smokeless tobacco: Never  Vaping Use   Vaping status: Never Used  Substance and Sexual Activity   Alcohol use: No   Drug use: No   Sexual activity: Yes    Birth control/protection: None  Other Topics Concern   Not on file  Social History Narrative   Lives with wife      5 cups of Soda the zero version in a day    Social Drivers of Health   Financial Resource Strain: Low Risk  (08/18/2024)   Overall Financial Resource Strain (CARDIA)    Difficulty of Paying Living Expenses: Not hard at all  Food Insecurity: No Food Insecurity (08/18/2024)   Hunger Vital Sign    Worried About Running Out of Food in the Last Year: Never true    Ran Out of Food in the Last Year: Never true  Transportation Needs: No Transportation Needs (08/18/2024)   PRAPARE - Administrator, Civil Service (Medical): No    Lack of Transportation (Non-Medical): No  Physical Activity: Inactive (08/18/2024)   Exercise Vital Sign    Days of Exercise per Week: 0 days    Minutes of Exercise per Session: Not on file  Stress: No Stress Concern Present (08/18/2024)   [employer] of Occupational Health - Occupational Stress Questionnaire    Feeling of Stress: Only a little  Social Connections: Moderately Integrated (08/18/2024)   Social Connection and Isolation Panel    Frequency of Communication with Friends and Family: Once a week    Frequency of Social Gatherings with Friends and Family: Once a week    Attends Religious Services: More than 4 times per year    Active Member of [employer] or  Organizations: Yes    Attends Banker Meetings: More than 4 times per year    Marital Status: Married  Catering manager Violence: Not At Risk (08/30/2023)   Humiliation, Afraid, Rape, and Kick questionnaire    Fear of Current or Ex-Partner: No    Emotionally Abused: No    Physically Abused: No    Sexually Abused: No    Review of Systems     Objective:   Physical Exam Vitals reviewed.  Constitutional:      Appearance: Normal appearance.  He is normal weight.  Cardiovascular:     Rate and Rhythm: Normal rate and regular rhythm.     Heart sounds: Normal heart sounds. No murmur heard.    No friction rub. No gallop.  Pulmonary:     Effort: Pulmonary effort is normal. No respiratory distress.     Breath sounds: Normal breath sounds. No stridor.  Abdominal:     General: Bowel sounds are normal. There is no distension.     Palpations: Abdomen is soft.     Tenderness: There is no abdominal tenderness. There is no guarding.  Musculoskeletal:     Right lower leg: No edema.     Left lower leg: No edema.  Skin:    Findings: No erythema or rash.  Neurological:     General: No focal deficit present.     Mental Status: He is alert and oriented to person, place, and time.     Cranial Nerves: No cranial nerve deficit.     Motor: No weakness.     Gait: Gait normal.         Assessment & Plan:  History of CVA (cerebrovascular accident)  Vitamin D deficiency We will increase Lipitor to 80 mg a day to reduce his stroke risk.  We will start vitamin D 50,000 units weekly for 6 months and then recheck levels in 6 months.

## 2024-08-21 ENCOUNTER — Encounter (INDEPENDENT_AMBULATORY_CARE_PROVIDER_SITE_OTHER): Payer: Self-pay | Admitting: Gastroenterology

## 2024-08-30 ENCOUNTER — Other Ambulatory Visit: Payer: Self-pay | Admitting: Adult Health

## 2024-08-30 DIAGNOSIS — R4189 Other symptoms and signs involving cognitive functions and awareness: Secondary | ICD-10-CM

## 2024-08-30 DIAGNOSIS — F01B4 Vascular dementia, moderate, with anxiety: Secondary | ICD-10-CM

## 2024-09-05 ENCOUNTER — Encounter

## 2024-10-10 ENCOUNTER — Ambulatory Visit: Admitting: *Deleted

## 2024-10-10 VITALS — Ht 74.0 in | Wt 171.0 lb

## 2024-10-10 DIAGNOSIS — Z0000 Encounter for general adult medical examination without abnormal findings: Secondary | ICD-10-CM

## 2024-10-10 NOTE — Progress Notes (Signed)
 Chief Complaint  Patient presents with   Medicare Wellness     Subjective:   Justin [name] is a 68 y.o. male who presents for a Medicare Annual Wellness Visit.  I connected with  Justin [name] on 10/10/24 by a audio enabled telemedicine application and verified that I am speaking with the correct person using two identifiers.  Patient Location: Home  Provider Location: Home Office  Persons Participating in Visit: Patient.  I discussed the limitations of evaluation and management by telemedicine. The patient expressed understanding and agreed to proceed.   Vital Signs: Because this visit was a virtual/telehealth visit, some criteria may be missing or patient reported. Any vitals not documented were not able to be obtained and vitals that have been documented are patient reported.      Visit info / Clinical Intake: Medicare Wellness Visit Type:: Subsequent Annual Wellness Visit Persons participating in visit and providing information:: patient; patient & caregiver Medicare Wellness Visit Mode:: Telephone If telephone:: video declined Since this visit was completed virtually, some vitals may be partially provided or unavailable. Missing vitals are due to the limitations of the virtual format.: Unable to obtain vitals - no equipment If Telephone or Video please confirm:: I connected with patient using audio/video enable telemedicine. I verified patient identity with two identifiers, discussed telehealth limitations, and patient agreed to proceed. Patient Location:: home Provider Location:: home Interpreter Needed?: No Pre-visit prep was completed: no AWV questionnaire completed by patient prior to visit?: no Living arrangements:: lives with spouse/significant other Patient's Overall Health Status Rating: good Typical amount of pain: none Does pain affect daily life?: no Are you currently prescribed opioids?: no  Dietary Habits and Nutritional Risks How many meals a day?:  3 Eats fruit and vegetables daily?: yes Most meals are obtained by: preparing own meals; eating out In the last 2 weeks, have you had any of the following?: none Diabetic:: no  Functional Status Activities of Daily Living (to include ambulation/medication): (!) Needs Assist Feeding: Independent Dressing/Grooming: Independent Bathing: Independent Toileting: Independent Transfer: Independent Ambulation: Independent Medication Administration: Dependent Home Management (perform basic housework or laundry): Needs assistance (comment) Manage your own finances?: (!) no Primary transportation is: family / friends Concerns about vision?: no *vision screening is required for WT[facility]* Concerns about hearing?: no  Fall Screening Falls in the past year?: 0 Number of falls in past year: 0 Was there an injury with Fall?: 0 Fall Risk Category Calculator: 0 Patient Fall Risk Level: Low Fall Risk  Fall Risk Patient at Risk for Falls Due to: No Fall Risks Fall risk Follow up: Falls evaluation completed; Education provided; Falls prevention discussed  Home and Transportation Safety: All rugs have non-skid backing?: (!) no All stairs or steps have railings?: yes Grab bars in the bathtub or shower?: yes Have non-skid surface in bathtub or shower?: (!) no Good home lighting?: yes Regular seat belt use?: yes Hospital stays in the last year:: no  Cognitive Assessment Difficulty concentrating, remembering, or making decisions? : yes  Advance Directives (For Healthcare) Does Patient Have a Medical Advance Directive?: Yes Type of Advance Directive: Healthcare Power of Attorney Copy of Healthcare Power of Attorney in Chart?: Yes - validated most recent copy scanned in chart (See row information)  Reviewed/Updated  Reviewed/Updated: Reviewed All (Medical, Surgical, Family, Medications, Allergies, Care Teams, Patient Goals); Surgical History; Family History; Medications; Allergies; Care Teams; Patient  Goals; Medical History    Allergies (verified) Patient has no known allergies.   Current Medications (  verified) Outpatient Encounter Medications as of 10/10/2024  Medication Sig   Ascorbic Acid (VITAMIN C) 500 MG CAPS Take 500 mg by mouth daily.   aspirin  EC 81 MG tablet Take 81 mg by mouth daily. Swallow whole.   atorvastatin  (LIPITOR) 80 MG tablet Take 1 tablet (80 mg total) by mouth daily.   busPIRone  (BUSPAR ) 5 MG tablet TAKE 1 TABLET (5 MG TOTAL) BY MOUTH 3 (THREE) TIMES DAILY AS NEEDED (INCREASED ANXIETY).   citalopram  (CELEXA ) 20 MG tablet Take 1 tablet (20 mg total) by mouth daily.   [employer] B-12 500 MCG tablet Take 500 mcg by mouth daily.   diphenhydrAMINE  (BENADRYL ) 25 MG tablet Take 25 mg by mouth daily as needed for allergies.   donepezil  (ARICEPT ) 5 MG tablet Take 1 tablet (5 mg total) by mouth at bedtime.   folic acid  (FOLVITE ) 1 MG tablet TAKE 1 TABLET BY MOUTH EVERY DAY   losartan  (COZAAR ) 100 MG tablet TAKE 1 TABLET BY MOUTH EVERY DAY   Melatonin 10 MG TABS Take 15 mg by mouth at bedtime.   pantoprazole  (PROTONIX ) 40 MG tablet Take 1 tablet (40 mg total) by mouth daily.   sulfaSALAzine  (AZULFIDINE ) 500 MG tablet TAKE 2 TABLETS BY MOUTH TWICE A DAY   Vitamin D , Ergocalciferol , (DRISDOL ) 1.25 MG (50000 UNIT) CAPS capsule Take 1 capsule (50,000 Units total) by mouth every 7 (seven) days.   No facility-administered encounter medications on file as of 10/10/2024.    History: Past Medical History:  Diagnosis Date   Acid reflux 04/27/2021   Acute metabolic encephalopathy 04/30/2021   Anxiety    Arthritis    Cerebral embolism with cerebral infarction 04/15/2018   Chest pain 05/19/2015   Depression    EEG abnormality without seizure 05/19/2021   Encephalitis 04/30/2021   Encephalopathy due to COVID-19 virus    Episodic confusion 05/19/2021   Heart murmur    Hyperglycemia 05/19/2015   Hypertension    Left thalamic lacunar infarct 05/24/2018   OSA (obstructive sleep  apnea)    cpap 9 cm H2O   Recurrent unilateral inguinal hernia with obstruction and without gangrene    SBO (small bowel obstruction)    Sleep apnea 3 years ago   Ulcerative colitis 09/24/2019   Vascular dementia without behavioral disturbance 05/19/2021   Past Surgical History:  Procedure Laterality Date   BIOPSY  06/21/2017   Procedure: BIOPSY;  Surgeon: Golda Claudis PENNER, MD;  Location: [facility];  Service: Endoscopy;;  rectal, righta and left colon;   COLONOSCOPY N/A 06/21/2017   Procedure: COLONOSCOPY;  Surgeon: Golda Claudis PENNER, MD;  Location: [facility];  Service: Endoscopy;  Laterality: N/A;  1:25   COLONOSCOPY WITH PROPOFOL  N/A 08/31/2022   Procedure: COLONOSCOPY WITH PROPOFOL ;  Surgeon: Eartha Angelia Sieving, MD;  Location: [facility];  Service: Gastroenterology;  Laterality: N/A;  1245 ASA 2   HERNIA REPAIR  3 years ago   INGUINAL HERNIA REPAIR Right 04/04/2018   Procedure: HERNIA REPAIR INGUINAL ADULT WITH MESH;  Surgeon: Mavis Anes, MD;  Location: [facility];  Service: General;  Laterality: Right;   INGUINAL HERNIA REPAIR Right 01/31/2019   Procedure: RECURRENT RIGHT INGUINAL HERNIORRHAPY WITH MESH, INCARCERATED;  Surgeon: Mavis Anes, MD;  Location: [facility];  Service: General;  Laterality: Right;   JOINT REPLACEMENT  July 19, 2021   Total knee repl.   KNEE SURGERY     tore mcl in high school (70's)   TOTAL KNEE ARTHROPLASTY Left 07/19/2021  Procedure: TOTAL KNEE ARTHROPLASTY;  Surgeon: Melodi Lerner, MD;  Location: [facility];  Service: Orthopedics;  Laterality: Left;   Family History  Problem Relation Age of Onset   Tuberculosis Mother    Stroke Mother    Arthritis Mother    Cancer Mother    Heart disease Father        died at 57   Early death Father    Bradycardia Brother        pacemaker   Stroke Brother    Hypertension Brother    Mental illness Paternal Grandfather        suicide at 65   Suicidality Paternal Grandfather    Cancer Paternal  Uncle    Dementia Maternal Uncle 52   Colon cancer Neg Hx    Social History   Occupational History   Occupation: Unemployed/retired    Comment: Personnel Officer  Tobacco Use   Smoking status: Never    Passive exposure: Past   Smokeless tobacco: Never  Vaping Use   Vaping status: Never Used  Substance and Sexual Activity   Alcohol use: No   Drug use: No   Sexual activity: Yes    Birth control/protection: None   Tobacco Counseling Counseling given: Not Answered  SDOH Screenings   Food Insecurity: No Food Insecurity (10/10/2024)  Housing: Unknown (10/10/2024)  Transportation Needs: No Transportation Needs (10/10/2024)  Utilities: Not At Risk (10/10/2024)  Alcohol Screen: Low Risk  (08/30/2023)  Depression (PHQ2-9): Medium Risk (10/10/2024)  Financial Resource Strain: Low Risk  (08/18/2024)  Physical Activity: Inactive (10/10/2024)  Social Connections: Moderately Integrated (10/10/2024)  Stress: No Stress Concern Present (08/18/2024)  Tobacco Use: Low Risk  (08/19/2024)  Health Literacy: Adequate Health Literacy (10/10/2024)   See flowsheets for full screening details  Depression Screen PHQ 2 & 9 Depression Scale- Over the past 2 weeks, how often have you been bothered by any of the following problems? Little interest or pleasure in doing things: 0 Feeling down, depressed, or hopeless (PHQ Adolescent also includes...irritable): 0 PHQ-2 Total Score: 0 Trouble falling or staying asleep, or sleeping too much: 2 Feeling tired or having little energy: 0 Poor appetite or overeating (PHQ Adolescent also includes...weight loss): 0 Feeling bad about yourself - or that you are a failure or have let yourself or your family down: 0 Trouble concentrating on things, such as reading the newspaper or watching television (PHQ Adolescent also includes...like school work): 3 Moving or speaking so slowly that other people could have noticed. Or the opposite - being so fidgety or restless that you have  been moving around a lot more than usual: 0 Thoughts that you would be better off dead, or of hurting yourself in some way: 0 PHQ-9 Total Score: 5 If you checked off any problems, how difficult have these problems made it for you to do your work, take care of things at home, or get along with other people?: Not difficult at all     Goals Addressed             This Visit's Progress    Patient Stated       Maintain current              Objective:    Today's Vitals   10/10/24 1403  Weight: 171 lb (77.6 kg)  Height: 6' 2 (1.88 m)   Body mass index is 21.96 kg/m.  Hearing/Vision screen Hearing Screening - Comments:: No trouble hearing Vision Screening - Comments:: [facility]    [place]  Up to date Immunizations and Health Maintenance Health Maintenance  Topic Date Due   Zoster Vaccines- Shingrix (1 of 2) 11/19/2024 (Originally 05/06/1975)   Hepatitis C Screening  08/19/2025 (Originally 05/06/1974)   Colonoscopy  08/31/2025   Medicare Annual Wellness (AWV)  10/10/2025   DTaP/Tdap/Td (3 - Td or Tdap) 08/29/2033   Pneumococcal Vaccine: 50+ Years  Completed   Influenza Vaccine  Completed   Meningococcal B Vaccine  Aged Out   COVID-19 Vaccine  Discontinued        Assessment/Plan:  This is a routine wellness examination for Demonta.  Patient Care Team: Duanne Butler DASEN, MD as PCP - General (Family Medicine)  I have personally reviewed and noted the following in the patient's chart:   Medical and social history Use of alcohol, tobacco or illicit drugs  Current medications and supplements including opioid prescriptions. Functional ability and status Nutritional status Physical activity Advanced directives List of other physicians Hospitalizations, surgeries, and ER visits in previous 12 months Vitals Screenings to include cognitive, depression, and falls Referrals and appointments  No orders of the defined types were placed in this encounter.  In addition, I have  reviewed and discussed with patient certain preventive protocols, quality metrics, and best practice recommendations. A written personalized care plan for preventive services as well as general preventive health recommendations were provided to patient.   Shanikqua Zarzycki, LPN   [id number]   Return in 1 year (on 10/10/2025).  After Visit Summary: (MyChart) Due to this being a telephonic visit, the after visit summary with patients personalized plan was offered to patient via MyChart   Nurse Notes:

## 2024-10-10 NOTE — Patient Instructions (Addendum)
 Justin [name],  Thank you for taking the time for your Medicare Wellness Visit. I appreciate your continued commitment to your health goals. Please review the care plan we discussed, and feel free to reach out if I can assist you further.  Please note that Annual Wellness Visits do not include a physical exam. Some assessments may be limited, especially if the visit was conducted virtually. If needed, we may recommend an in-person follow-up with your provider.  Ongoing Care Seeing your primary care provider every 3 to 6 months helps us  monitor your health and provide consistent, personalized care.  Referrals If a referral was made during today's visit and you haven't received any updates within two weeks, please contact the referred provider directly to check on the status.  Recommended Screenings:  Health Maintenance  Topic Date Due   Zoster (Shingles) Vaccine (1 of 2) 11/19/2024*   Hepatitis C Screening  08/19/2025*   Colon Cancer Screening  08/31/2025   Medicare Annual Wellness Visit  10/10/2025   DTaP/Tdap/Td vaccine (3 - Td or Tdap) 08/29/2033   Pneumococcal Vaccine for age over 60  Completed   Flu Shot  Completed   Meningitis B Vaccine  Aged Out   COVID-19 Vaccine  Discontinued  *Topic was postponed. The date shown is not the original due date.       10/10/2024    2:04 PM  Advanced Directives  Does Patient Have a Medical Advance Directive? Yes  Type of Advance Directive Healthcare Power of Attorney  Copy of Healthcare Power of Attorney in Chart? Yes - validated most recent copy scanned in chart (See row information)    Vision: Annual vision screenings are recommended for early detection of glaucoma, cataracts, and diabetic retinopathy. These exams can also reveal signs of chronic conditions such as diabetes and high blood pressure.  Dental: Annual dental screenings help detect early signs of oral cancer, gum disease, and other conditions linked to overall health, including  heart disease and diabetes.  Please see the attached documents for additional preventive care recommendations.  Justin [name] , Thank you for taking time to come for your Medicare Wellness Visit. I appreciate your ongoing commitment to your health goals. Please review the following plan we discussed and let me know if I can assist you in the future.   Screening recommendations/referrals: Colonoscopy:  Recommended yearly ophthalmology/optometry visit for glaucoma screening and checkup Recommended yearly dental visit for hygiene and checkup  Vaccinations: Influenza vaccine:  Pneumococcal vaccine:  Tdap vaccine:  Shingles vaccine:        Preventive Care 65 Years and Older, Male Preventive care refers to lifestyle choices and visits with your health care provider that can promote health and wellness. What does preventive care include? A yearly physical exam. This is also called an annual well check. Dental exams once or twice a year. Routine eye exams. Ask your health care provider how often you should have your eyes checked. Personal lifestyle choices, including: Daily care of your teeth and gums. Regular physical activity. Eating a healthy diet. Avoiding tobacco and drug use. Limiting alcohol use. Practicing safe sex. Taking low doses of aspirin  every day. Taking vitamin and mineral supplements as recommended by your health care provider. What happens during an annual well check? The services and screenings done by your health care provider during your annual well check will depend on your age, overall health, lifestyle risk factors, and family history of disease. Counseling  Your health care provider may ask you questions about  your: Alcohol use. Tobacco use. Drug use. Emotional well-being. Home and relationship well-being. Sexual activity. Eating habits. History of falls. Memory and ability to understand (cognition). Work and work astronomer. Screening  You may have the  following tests or measurements: Height, weight, and BMI. Blood pressure. Lipid and cholesterol levels. These may be checked every 5 years, or more frequently if you are over 65 years old. Skin check. Lung cancer screening. You may have this screening every year starting at age 68 if you have a 30-pack-year history of smoking and currently smoke or have quit within the past 15 years. Fecal occult blood test (FOBT) of the stool. You may have this test every year starting at age 56. Flexible sigmoidoscopy or colonoscopy. You may have a sigmoidoscopy every 5 years or a colonoscopy every 10 years starting at age 78. Prostate cancer screening. Recommendations will vary depending on your family history and other risks. Hepatitis C blood test. Hepatitis B blood test. Sexually transmitted disease (STD) testing. Diabetes screening. This is done by checking your blood sugar (glucose) after you have not eaten for a while (fasting). You may have this done every 1-3 years. Abdominal aortic aneurysm (AAA) screening. You may need this if you are a current or former smoker. Osteoporosis. You may be screened starting at age 47 if you are at high risk. Talk with your health care provider about your test results, treatment options, and if necessary, the need for more tests. Vaccines  Your health care provider may recommend certain vaccines, such as: Influenza vaccine. This is recommended every year. Tetanus, diphtheria, and acellular pertussis (Tdap, Td) vaccine. You may need a Td booster every 10 years. Zoster vaccine. You may need this after age 83. Pneumococcal 13-valent conjugate (PCV13) vaccine. One dose is recommended after age 35. Pneumococcal polysaccharide (PPSV23) vaccine. One dose is recommended after age 64. Talk to your health care provider about which screenings and vaccines you need and how often you need them. This information is not intended to replace advice given to you by your health care  provider. Make sure you discuss any questions you have with your health care provider. Document Released: 11/20/2015 Document Revised: 07/13/2016 Document Reviewed: 08/25/2015 Elsevier Interactive Patient Education  2017 [employer].  Fall Prevention in the Home Falls can cause injuries. They can happen to people of all ages. There are many things you can do to make your home safe and to help prevent falls. What can I do on the outside of my home? Regularly fix the edges of walkways and driveways and fix any cracks. Remove anything that might make you trip as you walk through a door, such as a raised step or threshold. Trim any bushes or trees on the path to your home. Use bright outdoor lighting. Clear any walking paths of anything that might make someone trip, such as rocks or tools. Regularly check to see if handrails are loose or broken. Make sure that both sides of any steps have handrails. Any raised decks and porches should have guardrails on the edges. Have any leaves, snow, or ice cleared regularly. Use sand or salt on walking paths during winter. Clean up any spills in your garage right away. This includes oil or grease spills. What can I do in the bathroom? Use night lights. Install grab bars by the toilet and in the tub and shower. Do not use towel bars as grab bars. Use non-skid mats or decals in the tub or shower. If you need to  sit down in the shower, use a plastic, non-slip stool. Keep the floor dry. Clean up any water  that spills on the floor as soon as it happens. Remove soap buildup in the tub or shower regularly. Attach bath mats securely with double-sided non-slip rug tape. Do not have throw rugs and other things on the floor that can make you trip. What can I do in the bedroom? Use night lights. Make sure that you have a light by your bed that is easy to reach. Do not use any sheets or blankets that are too big for your bed. They should not hang down onto the  floor. Have a firm chair that has side arms. You can use this for support while you get dressed. Do not have throw rugs and other things on the floor that can make you trip. What can I do in the kitchen? Clean up any spills right away. Avoid walking on wet floors. Keep items that you use a lot in easy-to-reach places. If you need to reach something above you, use a strong step stool that has a grab bar. Keep electrical cords out of the way. Do not use floor polish or wax that makes floors slippery. If you must use wax, use non-skid floor wax. Do not have throw rugs and other things on the floor that can make you trip. What can I do with my stairs? Do not leave any items on the stairs. Make sure that there are handrails on both sides of the stairs and use them. Fix handrails that are broken or loose. Make sure that handrails are as long as the stairways. Check any carpeting to make sure that it is firmly attached to the stairs. Fix any carpet that is loose or worn. Avoid having throw rugs at the top or bottom of the stairs. If you do have throw rugs, attach them to the floor with carpet tape. Make sure that you have a light switch at the top of the stairs and the bottom of the stairs. If you do not have them, ask someone to add them for you. What else can I do to help prevent falls? Wear shoes that: Do not have high heels. Have rubber bottoms. Are comfortable and fit you well Are closed at the toe. Do not wear sandals. If you use a stepladder: Make sure that it is fully opened. Do not climb a closed stepladder. Make sure that both sides of the stepladder are locked into place. Ask someone to hold it for you, if possible. Clearly mark and make sure that you can see: Any grab bars or handrails. First and last steps. Where the edge of each step is. Use tools that help you move around (mobility aids) if they are needed. These include: Canes. Walkers. Scooters. Crutches. Turn on the  lights when you go into a dark area. Replace any light bulbs as soon as they burn out. Set up your furniture so you have a clear path. Avoid moving your furniture around. If any of your floors are uneven, fix them. If there are any pets around you, be aware of where they are. Review your medicines with your doctor. Some medicines can make you feel dizzy. This can increase your chance of falling. Ask your doctor what other things that you can do to help prevent falls. This information is not intended to replace advice given to you by your health care provider. Make sure you discuss any questions you have with your health care  provider. Document Released: 08/20/2009 Document Revised: 03/31/2016 Document Reviewed: 11/28/2014 Elsevier Interactive Patient Education  2017 [employer].

## 2024-11-08 ENCOUNTER — Ambulatory Visit: Admitting: Family Medicine

## 2024-11-08 ENCOUNTER — Ambulatory Visit: Payer: Self-pay

## 2024-11-08 ENCOUNTER — Encounter: Payer: Self-pay | Admitting: Family Medicine

## 2024-11-08 VITALS — HR 58 | Ht 74.0 in | Wt 175.0 lb

## 2024-11-08 DIAGNOSIS — R197 Diarrhea, unspecified: Secondary | ICD-10-CM | POA: Diagnosis not present

## 2024-11-08 NOTE — Telephone Encounter (Signed)
 FYI Only or Action Required?: FYI only for provider: appointment scheduled on 1/2.  Patient was last seen in primary care on 08/19/2024 by Duanne Butler DASEN, MD.  Called Nurse Triage reporting Diarrhea and Abdominal Pain.  Symptoms began several days ago.  Interventions attempted: Rest, hydration, or home remedies.  Symptoms are: gradually improving.  Triage Disposition: See Physician Within 24 Hours  Patient/caregiver understands and will follow disposition?: Yes    Message from Semmes R sent at 11/08/2024  8:54 AM EST  Summary: diarrhea   Reason for Triage: Patients wife states for the past few days patient has had diarrhea and yesterday advised his stomach was hurting. Patient has dementia so wife Apolinar is not sure about all the specifics.  Apolinar can be reached at [phone]         Reason for Disposition  Abdominal pain  (Exceptions: Pain clears with each passage of diarrhea stool, or symptoms similar to previously diagnosed irritable bowel syndrome.)  Answer Assessment - Initial Assessment Questions This RN recommended pt be examined in next 24 hours for symptoms and since pt/wife unable to assess or attest to further symptoms given pt's dementia. Scheduled for this afternoon with PCP.   Not doubled over in pain Not complained of stomach hurting Has dementia Seldom complains about anything Not really said anything to me about whether or not stomach might hurt but noticed couple of days ago when went to bathroom, not typical to have strong odor but there was Told me stomach was upset, flushed before she could see Nilsa recognized that his appetite had not been as well the past few days Usually 3 meals and snacks, not been snacking Was in bathroom not flushed, could see that it was very loose, almost like water  Asked him how he felt, said he feels fine Doesn't seem to have fever or headache or other pain Seems to be in the morning Goes to bathroom a lot but  doesn't always do anything when he does, think from dementia just become a habit for him Don't know really what to do, something that will pass, don't want him to get dehydrated, don't want him to lose more weight Goes outside Not too weak to stand No vomiting Not more confused than usual, not any different Couldn't tell if blood, so cloudy Think at some point there has been blood in his stool, can tell from laundry, he won't let me in bathroom when he goes, locks the door, wondering if from all the trips to the bathroom Always have to pre-wash his underwear unsure if blood or just not cleaning well Yesterday appetite back to normal Not constant every time diarrhea But because can't get in there and he flushes, not sure if better Not on antibx recently  Protocols used: [facility]

## 2024-11-08 NOTE — Progress Notes (Signed)
 "  Subjective:    Patient ID: Justin [name], male    DOB: Mar 27, 1956, 69 y.o.   MRN: [id number]  HPI Patient's wife reports a 2 to 3-day history of diarrhea.  The patient has a history of ulcerative colitis.  Over the last 2 to 3 days he seems to be going to the bathroom more.  He is also been complaining of an upset stomach.  He denies any abdominal pain today.  He denies any nausea or vomiting.  He denies any cough or fever or chills.  He does have hyperactive bowel sounds on exam.  He denies any shortness of breath or myalgias. Past Medical History:  Diagnosis Date   Acid reflux 04/27/2021   Acute metabolic encephalopathy 04/30/2021   Anxiety    Arthritis    Cerebral embolism with cerebral infarction 04/15/2018   Chest pain 05/19/2015   Depression    EEG abnormality without seizure 05/19/2021   Encephalitis 04/30/2021   Encephalopathy due to COVID-19 virus    Episodic confusion 05/19/2021   Heart murmur    Hyperglycemia 05/19/2015   Hypertension    Left thalamic lacunar infarct 05/24/2018   OSA (obstructive sleep apnea)    cpap 9 cm H2O   Recurrent unilateral inguinal hernia with obstruction and without gangrene    SBO (small bowel obstruction)    Sleep apnea 3 years ago   Ulcerative colitis 09/24/2019   Vascular dementia without behavioral disturbance 05/19/2021   Past Surgical History:  Procedure Laterality Date   BIOPSY  06/21/2017   Procedure: BIOPSY;  Surgeon: Golda Claudis PENNER, MD;  Location: [facility];  Service: Endoscopy;;  rectal, righta and left colon;   COLONOSCOPY N/A 06/21/2017   Procedure: COLONOSCOPY;  Surgeon: Golda Claudis PENNER, MD;  Location: [facility];  Service: Endoscopy;  Laterality: N/A;  1:25   COLONOSCOPY WITH PROPOFOL  N/A 08/31/2022   Procedure: COLONOSCOPY WITH PROPOFOL ;  Surgeon: Eartha Angelia Sieving, MD;  Location: [facility];  Service: Gastroenterology;  Laterality: N/A;  1245 ASA 2   HERNIA REPAIR  3 years ago   INGUINAL HERNIA  REPAIR Right 04/04/2018   Procedure: HERNIA REPAIR INGUINAL ADULT WITH MESH;  Surgeon: Mavis Anes, MD;  Location: [facility];  Service: General;  Laterality: Right;   INGUINAL HERNIA REPAIR Right 01/31/2019   Procedure: RECURRENT RIGHT INGUINAL HERNIORRHAPY WITH MESH, INCARCERATED;  Surgeon: Mavis Anes, MD;  Location: [facility];  Service: General;  Laterality: Right;   JOINT REPLACEMENT  July 19, 2021   Total knee repl.   KNEE SURGERY     tore mcl in high school (70's)   TOTAL KNEE ARTHROPLASTY Left 07/19/2021   Procedure: TOTAL KNEE ARTHROPLASTY;  Surgeon: Melodi Lerner, MD;  Location: [facility];  Service: Orthopedics;  Laterality: Left;   Current Outpatient Medications on File Prior to Visit  Medication Sig Dispense Refill   Ascorbic Acid (VITAMIN C) 500 MG CAPS Take 500 mg by mouth daily.     aspirin  EC 81 MG tablet Take 81 mg by mouth daily. Swallow whole.     atorvastatin  (LIPITOR) 80 MG tablet Take 1 tablet (80 mg total) by mouth daily. 90 tablet 3   busPIRone  (BUSPAR ) 5 MG tablet TAKE 1 TABLET (5 MG TOTAL) BY MOUTH 3 (THREE) TIMES DAILY AS NEEDED (INCREASED ANXIETY). 270 tablet 2   citalopram  (CELEXA ) 20 MG tablet Take 1 tablet (20 mg total) by mouth daily. 90 tablet 3   [employer] B-12 500 MCG tablet Take 500 mcg  by mouth daily.     diphenhydrAMINE  (BENADRYL ) 25 MG tablet Take 25 mg by mouth daily as needed for allergies.     donepezil  (ARICEPT ) 5 MG tablet Take 1 tablet (5 mg total) by mouth at bedtime. 90 tablet 3   folic acid  (FOLVITE ) 1 MG tablet TAKE 1 TABLET BY MOUTH EVERY DAY 90 tablet 3   losartan  (COZAAR ) 100 MG tablet TAKE 1 TABLET BY MOUTH EVERY DAY 90 tablet 3   Melatonin 10 MG TABS Take 15 mg by mouth at bedtime.     pantoprazole  (PROTONIX ) 40 MG tablet Take 1 tablet (40 mg total) by mouth daily. 90 tablet 3   sulfaSALAzine  (AZULFIDINE ) 500 MG tablet TAKE 2 TABLETS BY MOUTH TWICE A DAY 360 tablet 3   Vitamin D , Ergocalciferol , (DRISDOL ) 1.25 MG (50000 UNIT) CAPS capsule Take  1 capsule (50,000 Units total) by mouth every 7 (seven) days. 5 capsule 5   No current facility-administered medications on file prior to visit.   No Known Allergies Social History   Socioeconomic History   Marital status: Married    Spouse name: Apolinar   Number of children: Not on file   Years of education: 12   Highest education level: 12th grade  Occupational History   Occupation: Unemployed/retired    Comment: Personnel Officer  Tobacco Use   Smoking status: Never    Passive exposure: Past   Smokeless tobacco: Never  Vaping Use   Vaping status: Never Used  Substance and Sexual Activity   Alcohol use: No   Drug use: No   Sexual activity: Yes    Birth control/protection: None  Other Topics Concern   Not on file  Social History Narrative   Lives with wife      5 cups of Soda the zero version in a day    Social Drivers of Health   Tobacco Use: Low Risk (08/19/2024)   Patient History    Smoking Tobacco Use: Never    Smokeless Tobacco Use: Never    Passive Exposure: Past  Financial Resource Strain: Low Risk (08/18/2024)   Overall Financial Resource Strain (CARDIA)    Difficulty of Paying Living Expenses: Not hard at all  Food Insecurity: No Food Insecurity (10/10/2024)   Epic    Worried About Programme Researcher, Broadcasting/film/video in the Last Year: Never true    Ran Out of Food in the Last Year: Never true  Transportation Needs: No Transportation Needs (10/10/2024)   Epic    Lack of Transportation (Medical): No    Lack of Transportation (Non-Medical): No  Physical Activity: Inactive (10/10/2024)   Exercise Vital Sign    Days of Exercise per Week: 0 days    Minutes of Exercise per Session: 0 min  Stress: No Stress Concern Present (10/10/2024)   [employer] of Occupational Health - Occupational Stress Questionnaire    Feeling of Stress: Not at all  Social Connections: Moderately Integrated (10/10/2024)   Social Connection and Isolation Panel    Frequency of Communication with Friends  and Family: Once a week    Frequency of Social Gatherings with Friends and Family: Once a week    Attends Religious Services: More than 4 times per year    Active Member of [employer] or Organizations: Yes    Attends Banker Meetings: More than 4 times per year    Marital Status: Married  Catering Manager Violence: Not At Risk (10/10/2024)   Epic    Fear of Current or Ex-Partner: No  Emotionally Abused: No    Physically Abused: No    Sexually Abused: No  Depression (PHQ2-9): Medium Risk (10/10/2024)   Depression (PHQ2-9)    PHQ-2 Score: 5  Alcohol Screen: Low Risk (08/30/2023)   Alcohol Screen    Last Alcohol Screening Score (AUDIT): 0  Housing: Unknown (10/10/2024)   Epic    Unable to Pay for Housing in the Last Year: No    Number of Times Moved in the Last Year: Not on file    Homeless in the Last Year: No  Utilities: Not At Risk (10/10/2024)   Epic    Threatened with loss of utilities: No  Health Literacy: Adequate Health Literacy (10/10/2024)   B1300 Health Literacy    Frequency of need for help with medical instructions: Never    Review of Systems     Objective:   Physical Exam Vitals reviewed.  Constitutional:      Appearance: Normal appearance. He is normal weight.  HENT:     Mouth/Throat:     Mouth: Mucous membranes are moist.     Pharynx: No oropharyngeal exudate.  Cardiovascular:     Rate and Rhythm: Normal rate and regular rhythm.     Heart sounds: Normal heart sounds. No murmur heard.    No friction rub. No gallop.  Pulmonary:     Effort: Pulmonary effort is normal. No respiratory distress.     Breath sounds: Normal breath sounds. No stridor. No wheezing or rales.  Abdominal:     General: Bowel sounds are increased. There is no distension.     Palpations: Abdomen is soft.     Tenderness: There is no abdominal tenderness. There is no guarding.  Musculoskeletal:     Right lower leg: No edema.     Left lower leg: No edema.  Skin:    Findings: No  erythema or rash.  Neurological:     General: No focal deficit present.     Mental Status: He is alert and oriented to person, place, and time.     Cranial Nerves: No cranial nerve deficit.     Motor: No weakness.     Gait: Gait normal.         Assessment & Plan:  Diarrhea, unspecified type Patient is having diarrhea.  I recommended using Imodium  over the next 3 to 4 days to see if symptoms will improve and treat the patient symptomatically.  If they notice bloody diarrhea or worsening abdominal pain, consider evaluation for worsening ulcerative colitis.  At the present time there is no indication to suggest an acute abdomen or sepsis or more serious bacterial illness.  Wife is comfortable with hydrating the patient and using Imodium  as symptomatic treatment "

## 2024-12-08 ENCOUNTER — Encounter (INDEPENDENT_AMBULATORY_CARE_PROVIDER_SITE_OTHER): Payer: Self-pay

## 2024-12-09 ENCOUNTER — Ambulatory Visit (INDEPENDENT_AMBULATORY_CARE_PROVIDER_SITE_OTHER): Admitting: Gastroenterology

## 2024-12-12 ENCOUNTER — Ambulatory Visit (INDEPENDENT_AMBULATORY_CARE_PROVIDER_SITE_OTHER): Admitting: Gastroenterology

## 2025-02-03 ENCOUNTER — Ambulatory Visit (INDEPENDENT_AMBULATORY_CARE_PROVIDER_SITE_OTHER): Admitting: Gastroenterology

## 2025-02-26 ENCOUNTER — Ambulatory Visit: Admitting: Adult Health
